# Patient Record
Sex: Male | Born: 1970 | Race: White | Hispanic: No | Marital: Single | Smoking: Never smoker
Health system: Southern US, Community
[De-identification: ages and names within clinical notes are randomized; demographics above are authoritative.]

## PROBLEM LIST (undated history)

## (undated) DIAGNOSIS — M549 Dorsalgia, unspecified: Secondary | ICD-10-CM

## (undated) DIAGNOSIS — K317 Polyp of stomach and duodenum: Secondary | ICD-10-CM

## (undated) DIAGNOSIS — G8929 Other chronic pain: Secondary | ICD-10-CM

## (undated) DIAGNOSIS — K219 Gastro-esophageal reflux disease without esophagitis: Secondary | ICD-10-CM

## (undated) DIAGNOSIS — J309 Allergic rhinitis, unspecified: Secondary | ICD-10-CM

## (undated) DIAGNOSIS — N2889 Other specified disorders of kidney and ureter: Secondary | ICD-10-CM

## (undated) DIAGNOSIS — R519 Headache, unspecified: Secondary | ICD-10-CM

## (undated) DIAGNOSIS — D179 Benign lipomatous neoplasm, unspecified: Secondary | ICD-10-CM

## (undated) DIAGNOSIS — K2270 Barrett's esophagus without dysplasia: Secondary | ICD-10-CM

## (undated) DIAGNOSIS — L408 Other psoriasis: Secondary | ICD-10-CM

## (undated) DIAGNOSIS — K449 Diaphragmatic hernia without obstruction or gangrene: Secondary | ICD-10-CM

## (undated) DIAGNOSIS — J189 Pneumonia, unspecified organism: Secondary | ICD-10-CM

## (undated) DIAGNOSIS — M76899 Other specified enthesopathies of unspecified lower limb, excluding foot: Secondary | ICD-10-CM

## (undated) DIAGNOSIS — B001 Herpesviral vesicular dermatitis: Secondary | ICD-10-CM

## (undated) DIAGNOSIS — R51 Headache: Secondary | ICD-10-CM

## (undated) DIAGNOSIS — E785 Hyperlipidemia, unspecified: Secondary | ICD-10-CM

## (undated) DIAGNOSIS — G4733 Obstructive sleep apnea (adult) (pediatric): Secondary | ICD-10-CM

## (undated) DIAGNOSIS — N182 Chronic kidney disease, stage 2 (mild): Secondary | ICD-10-CM

## (undated) DIAGNOSIS — I82442 Acute embolism and thrombosis of left tibial vein: Secondary | ICD-10-CM

## (undated) DIAGNOSIS — T7840XA Allergy, unspecified, initial encounter: Secondary | ICD-10-CM

## (undated) DIAGNOSIS — F419 Anxiety disorder, unspecified: Secondary | ICD-10-CM

## (undated) DIAGNOSIS — N401 Enlarged prostate with lower urinary tract symptoms: Secondary | ICD-10-CM

## (undated) DIAGNOSIS — K5792 Diverticulitis of intestine, part unspecified, without perforation or abscess without bleeding: Secondary | ICD-10-CM

## (undated) DIAGNOSIS — I10 Essential (primary) hypertension: Secondary | ICD-10-CM

## (undated) DIAGNOSIS — H269 Unspecified cataract: Secondary | ICD-10-CM

## (undated) DIAGNOSIS — D126 Benign neoplasm of colon, unspecified: Secondary | ICD-10-CM

## (undated) HISTORY — DX: Benign neoplasm of colon, unspecified: D12.6

## (undated) HISTORY — DX: Acute embolism and thrombosis of left tibial vein: I82.442

## (undated) HISTORY — DX: Anxiety disorder, unspecified: F41.9

## (undated) HISTORY — DX: Gastro-esophageal reflux disease without esophagitis: K21.9

## (undated) HISTORY — DX: Other chronic pain: G89.29

## (undated) HISTORY — DX: Polyp of stomach and duodenum: K31.7

## (undated) HISTORY — DX: Dorsalgia, unspecified: M54.9

## (undated) HISTORY — DX: Diaphragmatic hernia without obstruction or gangrene: K44.9

## (undated) HISTORY — DX: Headache, unspecified: R51.9

## (undated) HISTORY — DX: Chronic kidney disease, stage 2 (mild): N18.2

## (undated) HISTORY — DX: Benign prostatic hyperplasia with lower urinary tract symptoms: N40.1

## (undated) HISTORY — DX: Obstructive sleep apnea (adult) (pediatric): G47.33

## (undated) HISTORY — DX: Other specified enthesopathies of unspecified lower limb, excluding foot: M76.899

## (undated) HISTORY — DX: Allergy, unspecified, initial encounter: T78.40XA

## (undated) HISTORY — DX: Allergic rhinitis, unspecified: J30.9

## (undated) HISTORY — DX: Other specified disorders of kidney and ureter: N28.89

## (undated) HISTORY — DX: Headache: R51

## (undated) HISTORY — DX: Unspecified cataract: H26.9

## (undated) HISTORY — DX: Essential (primary) hypertension: I10

## (undated) HISTORY — DX: Diverticulitis of intestine, part unspecified, without perforation or abscess without bleeding: K57.92

## (undated) HISTORY — PX: LIPOMA EXCISION: SHX5283

## (undated) HISTORY — DX: Barrett's esophagus without dysplasia: K22.70

## (undated) HISTORY — DX: Hyperlipidemia, unspecified: E78.5

## (undated) HISTORY — DX: Herpesviral vesicular dermatitis: B00.1

## (undated) HISTORY — DX: Benign lipomatous neoplasm, unspecified: D17.9

## (undated) HISTORY — DX: Pneumonia, unspecified organism: J18.9

## (undated) HISTORY — DX: Other psoriasis: L40.8

---

## 2001-03-24 ENCOUNTER — Encounter (INDEPENDENT_AMBULATORY_CARE_PROVIDER_SITE_OTHER): Payer: Self-pay | Admitting: Specialist

## 2001-03-24 ENCOUNTER — Ambulatory Visit (HOSPITAL_BASED_OUTPATIENT_CLINIC_OR_DEPARTMENT_OTHER): Admission: RE | Admit: 2001-03-24 | Discharge: 2001-03-24 | Payer: Self-pay | Admitting: Plastic Surgery

## 2004-06-01 DIAGNOSIS — K317 Polyp of stomach and duodenum: Secondary | ICD-10-CM

## 2004-06-01 HISTORY — DX: Polyp of stomach and duodenum: K31.7

## 2004-07-07 ENCOUNTER — Ambulatory Visit: Payer: Self-pay | Admitting: Internal Medicine

## 2004-11-14 ENCOUNTER — Ambulatory Visit: Payer: Self-pay | Admitting: Internal Medicine

## 2004-11-24 ENCOUNTER — Ambulatory Visit: Payer: Self-pay | Admitting: Gastroenterology

## 2005-02-10 ENCOUNTER — Ambulatory Visit: Payer: Self-pay | Admitting: Gastroenterology

## 2005-02-20 ENCOUNTER — Encounter (INDEPENDENT_AMBULATORY_CARE_PROVIDER_SITE_OTHER): Payer: Self-pay | Admitting: *Deleted

## 2005-02-20 ENCOUNTER — Ambulatory Visit: Payer: Self-pay | Admitting: Gastroenterology

## 2005-06-01 DIAGNOSIS — D179 Benign lipomatous neoplasm, unspecified: Secondary | ICD-10-CM

## 2005-06-01 HISTORY — DX: Benign lipomatous neoplasm, unspecified: D17.9

## 2005-06-03 ENCOUNTER — Ambulatory Visit: Payer: Self-pay | Admitting: Internal Medicine

## 2005-06-04 ENCOUNTER — Ambulatory Visit: Payer: Self-pay | Admitting: Internal Medicine

## 2005-06-19 ENCOUNTER — Ambulatory Visit: Payer: Self-pay | Admitting: Internal Medicine

## 2005-08-17 ENCOUNTER — Ambulatory Visit: Payer: Self-pay | Admitting: Internal Medicine

## 2006-01-19 ENCOUNTER — Encounter (INDEPENDENT_AMBULATORY_CARE_PROVIDER_SITE_OTHER): Payer: Self-pay | Admitting: Specialist

## 2006-01-19 ENCOUNTER — Ambulatory Visit (HOSPITAL_BASED_OUTPATIENT_CLINIC_OR_DEPARTMENT_OTHER): Admission: RE | Admit: 2006-01-19 | Discharge: 2006-01-19 | Payer: Self-pay | Admitting: Plastic Surgery

## 2006-02-15 ENCOUNTER — Ambulatory Visit: Payer: Self-pay | Admitting: Family Medicine

## 2006-03-15 ENCOUNTER — Ambulatory Visit: Payer: Self-pay | Admitting: Internal Medicine

## 2006-04-07 ENCOUNTER — Ambulatory Visit: Payer: Self-pay | Admitting: Internal Medicine

## 2006-05-06 ENCOUNTER — Ambulatory Visit: Payer: Self-pay | Admitting: Internal Medicine

## 2006-07-09 ENCOUNTER — Ambulatory Visit: Payer: Self-pay | Admitting: Internal Medicine

## 2006-11-08 ENCOUNTER — Ambulatory Visit: Payer: Self-pay | Admitting: Internal Medicine

## 2007-01-05 DIAGNOSIS — L408 Other psoriasis: Secondary | ICD-10-CM

## 2007-01-05 DIAGNOSIS — I10 Essential (primary) hypertension: Secondary | ICD-10-CM

## 2007-01-05 HISTORY — DX: Essential (primary) hypertension: I10

## 2007-01-05 HISTORY — DX: Other psoriasis: L40.8

## 2007-02-08 ENCOUNTER — Ambulatory Visit: Payer: Self-pay | Admitting: Internal Medicine

## 2007-02-08 DIAGNOSIS — S838X9A Sprain of other specified parts of unspecified knee, initial encounter: Secondary | ICD-10-CM | POA: Insufficient documentation

## 2007-02-08 DIAGNOSIS — M7660 Achilles tendinitis, unspecified leg: Secondary | ICD-10-CM | POA: Insufficient documentation

## 2007-02-08 DIAGNOSIS — S86819A Strain of other muscle(s) and tendon(s) at lower leg level, unspecified leg, initial encounter: Secondary | ICD-10-CM

## 2007-02-08 DIAGNOSIS — G44209 Tension-type headache, unspecified, not intractable: Secondary | ICD-10-CM | POA: Insufficient documentation

## 2007-03-29 ENCOUNTER — Telehealth: Payer: Self-pay | Admitting: Family Medicine

## 2007-06-03 ENCOUNTER — Telehealth: Payer: Self-pay | Admitting: Internal Medicine

## 2007-07-19 ENCOUNTER — Telehealth: Payer: Self-pay | Admitting: Internal Medicine

## 2007-07-21 ENCOUNTER — Ambulatory Visit: Payer: Self-pay | Admitting: Internal Medicine

## 2007-07-21 DIAGNOSIS — N401 Enlarged prostate with lower urinary tract symptoms: Secondary | ICD-10-CM

## 2007-07-21 DIAGNOSIS — N400 Enlarged prostate without lower urinary tract symptoms: Secondary | ICD-10-CM | POA: Insufficient documentation

## 2007-07-21 DIAGNOSIS — N138 Other obstructive and reflux uropathy: Secondary | ICD-10-CM | POA: Insufficient documentation

## 2007-07-21 HISTORY — DX: Other obstructive and reflux uropathy: N13.8

## 2007-07-21 HISTORY — DX: Benign prostatic hyperplasia with lower urinary tract symptoms: N40.1

## 2007-11-21 ENCOUNTER — Telehealth: Payer: Self-pay | Admitting: Internal Medicine

## 2008-02-08 ENCOUNTER — Telehealth: Payer: Self-pay | Admitting: Internal Medicine

## 2008-02-14 ENCOUNTER — Telehealth: Payer: Self-pay | Admitting: Internal Medicine

## 2008-05-24 ENCOUNTER — Ambulatory Visit: Payer: Self-pay | Admitting: Internal Medicine

## 2008-07-11 ENCOUNTER — Ambulatory Visit: Payer: Self-pay | Admitting: Internal Medicine

## 2008-07-11 DIAGNOSIS — J309 Allergic rhinitis, unspecified: Secondary | ICD-10-CM

## 2008-07-11 DIAGNOSIS — R519 Headache, unspecified: Secondary | ICD-10-CM | POA: Insufficient documentation

## 2008-07-11 DIAGNOSIS — R51 Headache: Secondary | ICD-10-CM | POA: Insufficient documentation

## 2008-07-11 HISTORY — DX: Allergic rhinitis, unspecified: J30.9

## 2008-10-17 ENCOUNTER — Ambulatory Visit: Payer: Self-pay | Admitting: Internal Medicine

## 2008-10-17 DIAGNOSIS — M549 Dorsalgia, unspecified: Secondary | ICD-10-CM

## 2008-10-17 HISTORY — DX: Dorsalgia, unspecified: M54.9

## 2008-10-18 ENCOUNTER — Encounter: Payer: Self-pay | Admitting: Internal Medicine

## 2008-10-19 ENCOUNTER — Telehealth: Payer: Self-pay | Admitting: Internal Medicine

## 2009-06-10 ENCOUNTER — Telehealth: Payer: Self-pay | Admitting: *Deleted

## 2009-06-10 ENCOUNTER — Ambulatory Visit: Payer: Self-pay | Admitting: Internal Medicine

## 2009-06-14 ENCOUNTER — Emergency Department (HOSPITAL_BASED_OUTPATIENT_CLINIC_OR_DEPARTMENT_OTHER): Admission: EM | Admit: 2009-06-14 | Discharge: 2009-06-15 | Payer: Self-pay | Admitting: Emergency Medicine

## 2009-06-15 ENCOUNTER — Ambulatory Visit: Payer: Self-pay | Admitting: Diagnostic Radiology

## 2009-06-17 LAB — CONVERTED CEMR LAB
Basophils Absolute: 0.1 10*3/uL (ref 0.0–0.1)
Basophils Relative: 1 % (ref 0–1)
Eosinophils Absolute: 0.2 10*3/uL (ref 0.0–0.7)
Eosinophils Relative: 2 % (ref 0–5)
HCT: 46.4 % (ref 39.0–52.0)
Hemoglobin: 16.3 g/dL (ref 13.0–17.0)
Lymphocytes Relative: 19 % (ref 12–46)
Lymphs Abs: 1.2 10*3/uL (ref 0.7–4.0)
MCHC: 35.1 g/dL (ref 30.0–36.0)
MCV: 87.7 fL (ref 78.0–100.0)
Monocytes Absolute: 0.8 10*3/uL (ref 0.1–1.0)
Monocytes Relative: 12 % (ref 3–12)
Neutro Abs: 4.3 10*3/uL (ref 1.7–7.7)
Neutrophils Relative %: 67 % (ref 43–77)
Platelets: 307 10*3/uL (ref 150–400)
RBC: 5.29 M/uL (ref 4.22–5.81)
RDW: 12 % (ref 11.5–15.5)
WBC: 6.4 10*3/uL (ref 4.0–10.5)

## 2009-09-09 ENCOUNTER — Telehealth: Payer: Self-pay | Admitting: Internal Medicine

## 2009-09-30 ENCOUNTER — Ambulatory Visit: Payer: Self-pay | Admitting: Internal Medicine

## 2009-09-30 DIAGNOSIS — M76899 Other specified enthesopathies of unspecified lower limb, excluding foot: Secondary | ICD-10-CM

## 2009-09-30 HISTORY — DX: Other specified enthesopathies of unspecified lower limb, excluding foot: M76.899

## 2009-11-12 ENCOUNTER — Ambulatory Visit: Payer: Self-pay | Admitting: Internal Medicine

## 2010-07-01 NOTE — Progress Notes (Signed)
Summary: wants ov   Phone Note Call from Patient Call back at Home Phone [phone]   Caller: Patient-live call Complaint: Nausea/Vomiting/Diarrhea Summary of Call: Wants ov with Dr Lovell Sheehan. Neck and hip pain with nausea. Initial call taken by: Warnell Forester,  June 10, 2009 9:35 AM  Follow-up for Phone Call        ov given Follow-up by: Willy Eddy, LPN,  June 10, 2009 9:51 AM

## 2010-07-01 NOTE — Assessment & Plan Note (Signed)
Summary: follow up on rt hip injury/cjr   Vital Signs:  Patient profile:   40 year old male Weight:      182 pounds Temp:     98.1 degrees F oral BP sitting:   128 / 90  (right arm) Cuff size:   regular  Vitals Entered By: Duard Brady LPN (November 12, 2009 11:21 AM) CC: f/u on (R) hip pain - worse Is Patient Diabetic? No   CC:  f/u on (R) hip pain - worse.  History of Present Illness: 40 year old patient, who presents with a 4-day history of severe right hip pain.  This occurred suddenly [place] the beach [place] un[place] some luggage.  He is a fairly uncomfortable weekend. pain is aggravated by movement and weight-bearing.  He was seen here one month ago and treated with a general steroid injection that was felt to be a right hip bursitis.  This improved greatly, but probably did not return to baseline.  He states that for the past year.  He has had intermittent right hip pain  Allergies: 1)  ! Penicillin 2)  ! * Pepto Bismal  Past History:  Past Medical History: Reviewed history from 07/11/2008 and no changes required. Hypertension psoriasis Allergic rhinitis Headache  Physical Exam  General:  Well-developed,well-nourished,in no acute distress; alert,appropriate and cooperative throughout examination Msk:  range of motion right hip did tend to elicit pain.  There is no localized tenderness.  Pain was worse with internal rotation of the hip   Impression & Recommendations:  Problem # 1:  BURSITIS, HIP (ICD-726.5)  in view of the chronicity of the pain will set up for orthopedic evaluation  Orders: Radiology Referral (Radiology)  Problem # 2:  HYPERTENSION (ICD-401.9)  Complete Medication List: 1)  Cephadyn 50-650 Mg Tabs (Butalbital-acetaminophen) .... One by mouth q 4-6 hours prn 2)  Valtrex 1 Gm Tabs (Valacyclovir hcl) .... 1/2 by mouth daily 3)  Singulair 10 Mg Tabs (Montelukast sodium) .... Once daily 4)  Alprazolam 0.25 Mg Tabs (Alprazolam)  .... One  by mouth every 6 hour as needed anxiety 5)  Finasteride 5 Mg Tabs (Finasteride) .... One by mouth dailu 6)  Aciphex 20 Mg Tbec (Rabeprazole sodium) .... Qd 7)  Zyrtec Allergy 10 Mg Tbdp (Cetirizine hcl) .... Qd 8)  Doxycycline Hyclate 100 Mg Caps (Doxycycline hyclate) .... Bid  Patient Instructions: 1)  orthopedic follow-up as scheduled 2)  Take 400-600mg  of Ibuprofen (Advil, Motrin) with food every 4-6 hours as needed for relief of pain or comfort of fever.  Appended Document: Orders Update     Clinical Lists Changes  Orders: Added new Referral order of Orthopedic Referral (Ortho) - Signed

## 2010-07-01 NOTE — Assessment & Plan Note (Signed)
Summary: neck pain/bmw   Vital Signs:  Patient profile:   40 year old male Height:      69 inches Weight:      180 pounds BMI:     26.68 Temp:     98.2 degrees F oral Pulse rate:   72 / minute Resp:     14 per minute BP sitting:   110 / 70  (left arm)  Vitals Entered By: Willy Eddy, LPN (June 10, 2009 3:55 PM) CC: roa, Headache   CC:  roa and Headache.  History of Present Illness: The pt has aching and sense of enlarged lymph nodes in anterior chain has had up to three weeks of neck neck pain in the occiput to the trap on the left side and pain down the right leg with running has hx of mild disc dz Increased head aches with no relief from tylenol and nausia hx of migraines no night sweats  Preventive Screening-Counseling & Management  Alcohol-Tobacco     Smoking Status: never  Problems Prior to Update: 1)  Back Pain, Chronic, Intermittent  (ICD-724.5) 2)  Headache  (ICD-784.0) 3)  Allergic Rhinitis  (ICD-477.9) 4)  Sterilization  (ICD-V25.2) 5)  Benign Prostatic Hypertrophy, With Urinary Obstruction  (ICD-600.01) 6)  Tendinitis, Achilles  (ICD-726.71) 7)  Headache, Tension  (ICD-307.81) 8)  Muscle Strain, Right Calf  (ICD-844.8) 9)  Hypertension  (ICD-401.9) 10)  Psoriasis  (ICD-696.1)  Medications Prior to Update: 1)  Aciphex 20 Mg Tbec (Rabeprazole Sodium) .... Once Daily 2)  Cephadyn 50-650 Mg Tabs (Butalbital-Acetaminophen) .... One By Mouth Q 4-6 Hours Prn 3)  Nasonex 50 Mcg/act Susp (Mometasone Furoate) .... Two Sprays Per Nostril  Daily 4)  Valtrex 1 Gm Tabs (Valacyclovir Hcl) .... 1/2 By Mouth Daily 5)  Singulair 10 Mg  Tabs (Montelukast Sodium) .... Once Daily 6)  Claritin-D 12 Hour 5-120 Mg Xr12h-Tab (Loratadine-Pseudoephedrine) .... One By Mouth Two Times A Day   Generic Ok 7)  Alprazolam 0.25 Mg Tabs (Alprazolam) .... One  By Mouth Every 6 Hour As Needed Anxiety 8)  Rapaflo 4 Mg Caps (Silodosin) .... One By Mouth Daily 9)  Finasteride 5 Mg  Tabs (Finasteride) .... One By Mouth Dailu 10)  Zipsor 25 Mg Caps (Diclofenac Potassium) .... One By Mouth Tid 11)  Amrix 15 Mg Xr24h-Cap (Cyclobenzaprine Hcl) .... Two By Mouth Daily  Current Medications (verified): 1)  Aciphex 20 Mg Tbec (Rabeprazole Sodium) .... Once Daily 2)  Cephadyn 50-650 Mg Tabs (Butalbital-Acetaminophen) .... One By Mouth Q 4-6 Hours Prn 3)  Nasonex 50 Mcg/act Susp (Mometasone Furoate) .... Two Sprays Per Nostril  Daily 4)  Valtrex 1 Gm Tabs (Valacyclovir Hcl) .... 1/2 By Mouth Daily 5)  Singulair 10 Mg  Tabs (Montelukast Sodium) .... Once Daily 6)  Claritin-D 12 Hour 5-120 Mg Xr12h-Tab (Loratadine-Pseudoephedrine) .... One By Mouth Two Times A Day   Generic Ok 7)  Alprazolam 0.25 Mg Tabs (Alprazolam) .... One  By Mouth Every 6 Hour As Needed Anxiety 8)  Rapaflo 4 Mg Caps (Silodosin) .... One By Mouth Daily 9)  Finasteride 5 Mg Tabs (Finasteride) .... One By Mouth Dailu 10)  Zipsor 25 Mg Caps (Diclofenac Potassium) .... One By Mouth Tid 11)  Amrix 15 Mg Xr24h-Cap (Cyclobenzaprine Hcl) .... Two By Mouth Daily  Allergies (verified): 1)  ! Penicillin 2)  ! * Pepto Bismal  Past History:  Family History: Last updated: 02/08/2007 na  Social History: Last updated: 02/08/2007 Occupation: Married Never Smoked  Risk Factors: Smoking Status: never (06/10/2009)  Past medical, surgical, family and social histories (including risk factors) reviewed, and no changes noted (except as noted below).  Past Medical History: Reviewed history from 07/11/2008 and no changes required. Hypertension psoriasis Allergic rhinitis Headache  Past Surgical History: Reviewed history from 02/08/2007 and no changes required. lipoma on arm ( 10)  Family History: Reviewed history from 02/08/2007 and no changes required. na  Social History: Reviewed history from 02/08/2007 and no changes required. Occupation: Married Never Smoked  Review of Systems  The patient denies  anorexia, fever, weight loss, weight gain, vision loss, decreased hearing, hoarseness, chest pain, syncope, dyspnea on exertion, peripheral edema, prolonged cough, headaches, hemoptysis, abdominal pain, melena, hematochezia, severe indigestion/heartburn, hematuria, incontinence, genital sores, muscle weakness, suspicious skin lesions, transient blindness, difficulty walking, depression, unusual weight change, abnormal bleeding, enlarged lymph nodes, angioedema, and breast masses.    Physical Exam  General:  Well-developed,well-nourished,in no acute distress; alert,appropriate and cooperative throughout examination Head:  Normocephalic and atraumatic without obvious abnormalities. No apparent alopecia or balding. Eyes:  pupils equal and pupils round.   Ears:  R ear normal and L ear normal.   Nose:  no external deformity and no nasal discharge.   Mouth:  good dentition and pharynx pink and moist.   Neck:  No deformities, masses, or tenderness noted. Lungs:  Normal respiratory effort, chest expands symmetrically. Lungs are clear to auscultation, no crackles or wheezes. Heart:  Normal rate and regular rhythm. S1 and S2 normal without gallop, murmur, click, rub or other extra sounds. Abdomen:  Bowel sounds positive,abdomen soft and non-tender without masses, organomegaly or hernias noted. Msk:  tender right SCM and Left trap with tenderness no masedes Pulses:  R and L carotid,radial,femoral,dorsalis pedis and posterior tibial pulses are full and equal bilaterally Extremities:  No clubbing, cyanosis, edema, or deformity noted with normal full range of motion of all joints.   Cervical Nodes:  No lymphadenopathy noted Axillary Nodes:  No palpable lymphadenopathy Psych:  Oriented X3 and not anxious appearing.     Impression & Recommendations:  Problem # 1:  HEADACHE (ICD-784.0)  migrain  The following medications were removed from the medication list:    Zipsor 25 Mg Caps (Diclofenac potassium)  ..... One by mouth tid His updated medication list for this problem includes:    Cephadyn 50-650 Mg Tabs (Butalbital-acetaminophen) ..... One by mouth q 4-6 hours prn    Indomethacin 50 Mg Caps (Indomethacin) ..... One by mouth two times a day  Headache diary reviewed.  Orders: Venipuncture (78295) TLB-CBC Platelet - w/Differential (85025-CBCD)  Complete Medication List: 1)  Protonix 20 Mg Tbec (Pantoprazole sodium) .... One by mouth daily 2)  Cephadyn 50-650 Mg Tabs (Butalbital-acetaminophen) .... One by mouth q 4-6 hours prn 3)  Nasonex 50 Mcg/act Susp (Mometasone furoate) .... Two sprays per nostril  daily 4)  Valtrex 1 Gm Tabs (Valacyclovir hcl) .... 1/2 by mouth daily 5)  Singulair 10 Mg Tabs (Montelukast sodium) .... Once daily 6)  Claritin-d 12 Hour 5-120 Mg Xr12h-tab (Loratadine-pseudoephedrine) .... One by mouth two times a day   generic ok 7)  Alprazolam 0.25 Mg Tabs (Alprazolam) .... One  by mouth every 6 hour as needed anxiety 8)  Rapaflo 4 Mg Caps (Silodosin) .... One by mouth daily 9)  Finasteride 5 Mg Tabs (Finasteride) .... One by mouth dailu 10)  Tizanidine Hcl 4 Mg Tabs (Tizanidine hcl) .... One by mouth two times a day 11)  Indomethacin 50 Mg  Caps (Indomethacin) .... One by mouth two times a day 12)  Methylprednisolone 4 Mg Tabs (Methylprednisolone) .... 4 by mouth for 3 days then 3 by mouth for 3 days the 2 by mouth for 3 days then 1 by mouth for 3 days  Patient Instructions: 1)  Please schedule a follow-up appointment in 1 month. Prescriptions: PROTONIX 20 MG TBEC (PANTOPRAZOLE SODIUM) one by mouth daily  #30 x 11   Entered and Authorized by:   Stacie Glaze MD   Signed by:   Stacie Glaze MD on 06/10/2009   Method used:   Electronically to        [facility] [phone]* (retail)       [address]       [place], [place]  [address]       Ph: [phone]       Fax: [phone]   RxID:   [id number] METHYLPREDNISOLONE 4  MG TABS (METHYLPREDNISOLONE) 4 by mouth for 3 days then 3 by mouth for 3 days the 2 by mouth for 3 days then 1 by mouth for 3 days  #30 x 0   Entered and Authorized by:   Stacie Glaze MD   Signed by:   Stacie Glaze MD on 06/10/2009   Method used:   Electronically to        [facility] [phone]* (retail)       [address]       [place], [place]  [address]       Ph: [phone]       Fax: [phone]   RxID:   [id number] INDOMETHACIN 50 MG CAPS (INDOMETHACIN) one by mouth two times a day  #60 x 0   Entered and Authorized by:   Stacie Glaze MD   Signed by:   Stacie Glaze MD on 06/10/2009   Method used:   Electronically to        [facility] [phone]* (retail)       [address]       [place], [place]  [address]       Ph: [phone]       Fax: [phone]   RxID:   [id number] TIZANIDINE HCL 4 MG TABS (TIZANIDINE HCL) one by mouth two times a day  #30 x 1   Entered and Authorized by:   Stacie Glaze MD   Signed by:   Stacie Glaze MD on 06/10/2009   Method used:   Electronically to        [facility] [phone]* (retail)       [address]       [place], [place]  [address]       Ph: [phone]       Fax: [phone]   RxID:   [id number]

## 2010-07-01 NOTE — Assessment & Plan Note (Signed)
Summary: HIP PAIN, PINCHED NERVE??   Vital Signs:  Patient profile:   40 year old male Weight:      188 pounds Temp:     98.1 degrees F oral BP sitting:   116 / 70  (left arm) Cuff size:   regular  Vitals Entered By: Duard Brady LPN (Oct 01, 5619 10:59 AM) CC: c.o (R) hip pain on/off x8yrs - bad today   , c/o headache Is Patient Diabetic? No   CC:  c.o (R) hip pain on/off x71yrs - bad today    and c/o headache.  History of Present Illness: a 40 year old patient has a history of intermittent low back and hip pain for the past two weeks.  He is an increase in the right hip discomfort.  This is aggravated by bending and stooping, and pressure over this area.  He was involved recently,  in  Patent examiner training, activities, which really aggravated the hip discomfort  Preventive Screening-Counseling & Management  Alcohol-Tobacco     Smoking Status: never  Allergies: 1)  ! Penicillin 2)  ! * Pepto Bismal  Physical Exam  General:  Well-developed,well-nourished,in no acute distress; alert,appropriate and cooperative throughout examination Msk:  range-of-motion the right hip was intact.  Straight leg testing negative;  internal rotation.  The hip did tend to aggravate the discomfort   Impression & Recommendations:  Problem # 1:  BURSITIS, HIP (ICD-726.5)  Complete Medication List: 1)  Cephadyn 50-650 Mg Tabs (Butalbital-acetaminophen) .... One by mouth q 4-6 hours prn 2)  Valtrex 1 Gm Tabs (Valacyclovir hcl) .... 1/2 by mouth daily 3)  Singulair 10 Mg Tabs (Montelukast sodium) .... Once daily 4)  Alprazolam 0.25 Mg Tabs (Alprazolam) .... One  by mouth every 6 hour as needed anxiety 5)  Finasteride 5 Mg Tabs (Finasteride) .... One by mouth dailu 6)  Aciphex 20 Mg Tbec (Rabeprazole sodium) .... Qd 7)  Zyrtec Allergy 10 Mg Tbdp (Cetirizine hcl) .... Qd  Patient Instructions: 1)  Aleve two tablets twice daily as needed for pain 2)  call if  unimproved Prescriptions: ALPRAZOLAM 0.25 MG TABS (ALPRAZOLAM) ONE  by mouth every 6 hour as needed anxiety  #60 x 5   Entered and Authorized by:   Gordy Savers  MD   Signed by:   Gordy Savers  MD on 09/30/2009   Method used:   Print then Give to Patient   RxID:   [id number] CEPHADYN 50-650 MG TABS (BUTALBITAL-ACETAMINOPHEN) one by mouth q 4-6 hours prn  #60 x 1   Entered and Authorized by:   Gordy Savers  MD   Signed by:   Gordy Savers  MD on 09/30/2009   Method used:   Print then Give to Patient   RxID:   [id number]   Appended Document: HIP PAIN, PINCHED NERVE??     Vitals Entered By: Duard Brady LPN (Sep 30, 4401 11:54 AM)  Allergies: 1)  ! Penicillin 2)  ! * Pepto Bismal   Complete Medication List: 1)  Cephadyn 50-650 Mg Tabs (Butalbital-acetaminophen) .... One by mouth q 4-6 hours prn 2)  Valtrex 1 Gm Tabs (Valacyclovir hcl) .... 1/2 by mouth daily 3)  Singulair 10 Mg Tabs (Montelukast sodium) .... Once daily 4)  Alprazolam 0.25 Mg Tabs (Alprazolam) .... One  by mouth every 6 hour as needed anxiety 5)  Finasteride 5 Mg Tabs (Finasteride) .... One by mouth dailu 6)  Aciphex 20 Mg Tbec (Rabeprazole sodium) .... Qd  7)  Zyrtec Allergy 10 Mg Tbdp (Cetirizine hcl) .... Qd  Other Orders: Depo- Medrol 80mg  (J1040) Admin of Therapeutic Inj  intramuscular or subcutaneous (45409)    Medication Administration  Injection # 1:    Medication: Depo- Medrol 80mg     Diagnosis: BURSITIS, HIP (ICD-726.5)    Route: IM    Site: R deltoid    Exp Date: 04/2012    Lot #: [id number]    Mfr: Pharmacia    Patient tolerated injection without complications    Given by: Duard Brady LPN (Sep 30, 8117 11:55 AM)  Orders Added: 1)  Depo- Medrol 80mg  [J1040] 2)  Admin of Therapeutic Inj  intramuscular or subcutaneous [[id number]]

## 2010-07-01 NOTE — Progress Notes (Signed)
  Phone Note Call from Patient   Caller: Patient Call For: Stacie Glaze MD Summary of Call: [facility]) Singulair 10 mg. #30 3 refills.  Initial call taken by: Lynann Beaver CMA,  September 09, 2009 3:00 PM    New/Updated Medications: SINGULAIR 10 MG  TABS (MONTELUKAST SODIUM) once daily Prescriptions: SINGULAIR 10 MG  TABS (MONTELUKAST SODIUM) once daily  #30 x 3   Entered by:   Lynann Beaver CMA   Authorized by:   Stacie Glaze MD   Signed by:   Lynann Beaver CMA on 09/09/2009   Method used:   Electronically to        [phone] (retail)       [address]       [place], [place]  [address]       Ph: [phone] or [phone]       Fax: [phone]   RxID:   [id number]

## 2010-07-01 NOTE — Letter (Signed)
Summary: Generic Letter  [facility]  [address] [place], [place] [address]   Phone: [phone]  Fax: [phone]    09/30/2009  [name] [address] [place], [place]  [address]  Dear Benjamin [name]:  [name] has a history of right hip bursitis, which flares up intermittently, as well as low back pain.  At the present time, he is under active treatment for a flare of the hip bursitis.  It is recommended that he  postpone any vigorous training maneuvers, and activities  until clinically stable.           Sincerely,   Eleonore Chiquito  MD

## 2010-07-03 ENCOUNTER — Other Ambulatory Visit: Payer: Self-pay | Admitting: Internal Medicine

## 2010-07-03 DIAGNOSIS — R51 Headache: Secondary | ICD-10-CM

## 2010-07-04 ENCOUNTER — Other Ambulatory Visit: Payer: Self-pay | Admitting: Internal Medicine

## 2010-07-04 DIAGNOSIS — R51 Headache: Secondary | ICD-10-CM

## 2010-07-12 ENCOUNTER — Other Ambulatory Visit: Payer: Self-pay | Admitting: Internal Medicine

## 2010-07-12 DIAGNOSIS — R51 Headache: Secondary | ICD-10-CM

## 2010-07-14 MED ORDER — BUTALBITAL-ACETAMINOPHEN 50-650 MG PO TABS: 50.0000 | ORAL_TABLET | ORAL | Status: DC | PRN

## 2010-07-14 NOTE — Telephone Encounter (Signed)
Dr. Lovell Sheehan patient.

## 2010-07-17 ENCOUNTER — Other Ambulatory Visit: Payer: Self-pay | Admitting: Internal Medicine

## 2010-07-18 NOTE — Telephone Encounter (Signed)
Dr. Lovell Sheehan pt.

## 2010-08-17 LAB — BASIC METABOLIC PANEL
BUN: 29 mg/dL — ABNORMAL HIGH (ref 6–23)
CO2: 29 mEq/L (ref 19–32)
Calcium: 9.2 mg/dL (ref 8.4–10.5)
Chloride: 102 mEq/L (ref 96–112)
Creatinine, Ser: 1.1 mg/dL (ref 0.4–1.5)
GFR calc Af Amer: >60 mL/min (ref >60)
GFR calc non Af Amer: >60 mL/min (ref >60)
Glucose, Bld: 108 mg/dL — ABNORMAL HIGH (ref 70–99)
Potassium: 3.9 mEq/L (ref 3.5–5.1)
Sodium: 144 mEq/L (ref 135–145)

## 2010-08-17 LAB — DIFFERENTIAL
Basophils Absolute: 0.3 10*3/uL — ABNORMAL HIGH (ref 0.0–0.1)
Basophils Relative: 2 % — ABNORMAL HIGH (ref 0–1)
Eosinophils Absolute: 0.1 10*3/uL (ref 0.0–0.7)
Eosinophils Relative: 1 % (ref 0–5)
Lymphocytes Relative: 16 % (ref 12–46)
Lymphs Abs: 2.6 10*3/uL (ref 0.7–4.0)
Monocytes Absolute: 1 10*3/uL (ref 0.1–1.0)
Monocytes Relative: 6 % (ref 3–12)
Neutro Abs: 12.4 10*3/uL — ABNORMAL HIGH (ref 1.7–7.7)
Neutrophils Relative %: 75 % (ref 43–77)

## 2010-08-17 LAB — CBC
HCT: 50.6 % (ref 39.0–52.0)
Hemoglobin: 17.5 g/dL — ABNORMAL HIGH (ref 13.0–17.0)
MCHC: 34.5 g/dL (ref 30.0–36.0)
MCV: 90.3 fL (ref 78.0–100.0)
Platelets: 412 10*3/uL — ABNORMAL HIGH (ref 150–400)
RBC: 5.6 MIL/uL (ref 4.22–5.81)
RDW: 11.6 % (ref 11.5–15.5)
WBC: 16.4 10*3/uL — ABNORMAL HIGH (ref 4.0–10.5)

## 2010-08-17 LAB — URINALYSIS, ROUTINE W REFLEX MICROSCOPIC
Bilirubin Urine: NEGATIVE
Glucose, UA: NEGATIVE mg/dL
Hgb urine dipstick: NEGATIVE
Ketones, ur: NEGATIVE mg/dL
Nitrite: NEGATIVE
Protein, ur: NEGATIVE mg/dL
Specific Gravity, Urine: 1.037 — ABNORMAL HIGH (ref 1.005–1.030)
Urobilinogen, UA: 0.2 mg/dL (ref 0.0–1.0)
pH: 6 (ref 5.0–8.0)

## 2010-08-28 ENCOUNTER — Other Ambulatory Visit: Payer: Self-pay | Admitting: Internal Medicine

## 2010-09-26 ENCOUNTER — Other Ambulatory Visit: Payer: Self-pay | Admitting: Internal Medicine

## 2010-10-17 NOTE — Op Note (Signed)
[place]  Patient:    Benjamin [name], Benjamin [name] Visit Number: [id number] MRN: [id number]          Service Type: DSU Location: [facility] Attending Physician:  Eloise Levels Dictated by:   Mary A. Contogiannis, M.D. Proc. Date: 03/24/01 Admit Date:  03/24/2001 Discharge Date: 03/24/2001                             Operative Report  PREOPERATIVE DIAGNOSES: 1. Right forearm masses times two. 2. Left forearm mass.  POSTOPERATIVE DIAGNOSES: 1. Right forearm masses times two. 2. Left forearm mass.  PROCEDURE: 1. Excision of 2.5 cm right proximal forearm lipoma. 2. Excision of 1.7 cm right distal forearm lipoma. 3. Excision of 2.2 cm left forearm lipoma. 4. Intermediate closure of 1.7 cm proximal right forearm incision. 5. Intermediate closure of 1.5 cm distal right forearm incision. 6. Intermediate closure of 1.7 cm left forearm incision.  SURGEON:  Mary A. Contogiannis, M.D.  ANESTHESIA:  One percent lidocaine with epinephrine.  COMPLICATIONS:  None.  INDICATIONS FOR PROCEDURE:  The patient is a 40 year old Caucasian male who had been referred by Dr. Darryll Capers for evaluation of bilateral forearm masses.  The patient had these masses which were palpable in the office and are becoming clinically symptomatic.  Additionally today, he also has a second right forearm mass that has become clinically symptomatic since his visit to me, and will likely have it excised as well.  We will therefore proceed with excision of the three soft tissue masses from the forearm.  DESCRIPTION OF PROCEDURE:  The patient was brought to the operating room and placed on the OR table in the supine position.  The forearms were prepped with Betadine and draped in a sterile fashion.  The skin and subcutaneous tissues in the areas of the soft tissue lesions were then injected with 1% lidocaine with epinephrine.  After adequate hemostasis and anesthesia had taken  effect, the procedure was begun.  These masses were easily palpable.  First, an incision was made over the proximal right forearm mass.  The skin was divided.  Then, when the subcutaneous tissues were entered, it was apparent that there was a lipoma present.  This lipoma measured 2.5 cm.  The lipoma was then sharply excised and passed off the table to undergo permanent pathologic section evaluation.  The incision was then closed with 4-0 Monocryl in the deeper subcutaneous tissues followed by 4-0 Monocryl interrupted dermal sutures, and then a 4-0 Monocryl running intracuticular stitch on the skin.  A second distal right forearm mass was palpable.  An incision was made over this and through the skin into the subcutaneous tissues.  Another lipoma was apparent.  This lipoma was then excised sharply.  This lipoma measured 1.7 cm. The deeper subcutaneous tissues were closed with a 4-0 Monocryl interrupted suture.  The dermal layer was closed with 4-0 Monocryl interrupted sutures followed by 4-0 Monocryl running intracuticular stitch on the skin.  The left forearm was then approached.  The palpable mass then had an incision made over it that went through the skin into the subcutaneous tissues.  Upon entering the subcutaneous tissue, another fatty tumor mass was present.  This mass was then sharply excised.  That apparent lipoma measured 2.2 cm.  The wound was then closed with 4-0 Monocryl in the deeper subcutaneous tissues followed by a 4-0 Monocryl interrupted dermal sutures followed by 4-0 Monocryl running intracuticular  stitch on the skin.  All the incisions were dressed with Benzoin and Steri-Strips.  There were no complications.  The patient tolerated the procedure well.  All the three specimens were passed off the table to undergo permanent pathologic section evaluation.  The patient was ambulated and recovered well.  He was given proper wound care instructions.  He was discharged home  in stable condition with a follow up appointment planned for tomorrow in the office. Dictated by:   Mary A. Contogiannis, M.D. Attending Physician:  Eloise Levels DD:  03/24/01 TD:  03/27/01 Job: [id number]

## 2010-11-19 ENCOUNTER — Other Ambulatory Visit: Payer: Self-pay | Admitting: Internal Medicine

## 2010-12-22 ENCOUNTER — Other Ambulatory Visit: Payer: Self-pay | Admitting: Plastic Surgery

## 2011-03-25 ENCOUNTER — Encounter: Payer: Self-pay | Admitting: Internal Medicine

## 2011-03-26 ENCOUNTER — Encounter: Payer: Self-pay | Admitting: Internal Medicine

## 2011-03-26 ENCOUNTER — Ambulatory Visit (INDEPENDENT_AMBULATORY_CARE_PROVIDER_SITE_OTHER): Payer: 59 | Admitting: Internal Medicine

## 2011-03-26 VITALS — BP 120/82 | Temp 97.9°F | Wt 184.0 lb

## 2011-03-26 DIAGNOSIS — Z0000 Encounter for general adult medical examination without abnormal findings: Secondary | ICD-10-CM

## 2011-03-26 DIAGNOSIS — Z23 Encounter for immunization: Secondary | ICD-10-CM

## 2011-03-26 DIAGNOSIS — I10 Essential (primary) hypertension: Secondary | ICD-10-CM

## 2011-03-26 DIAGNOSIS — R51 Headache: Secondary | ICD-10-CM

## 2011-03-26 MED ORDER — ALPRAZOLAM 0.25 MG PO TABS: 0.2500 mg | ORAL_TABLET | Freq: Four times a day (QID) | ORAL | Status: DC | PRN

## 2011-03-26 MED ORDER — TRAMADOL HCL 50 MG PO TABS: 50.0000 mg | ORAL_TABLET | Freq: Four times a day (QID) | ORAL | Status: DC | PRN

## 2011-03-26 NOTE — Progress Notes (Signed)
  Subjective:    Patient ID: Benjamin [name], male    DOB: 06-May-1971, 40 y.o.   MRN: [id number]  HPI  40 year old patient who has a history of hypertension. Presently he is not on medication. He has been seen by plastic surgery and on at least one occasion minor surgery was postponed due to 2 elevated blood pressure. His chief complaint today is headache neck pain as well as upper back and shoulder discomfort. 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 He does have a family history of migraine headaches he describes pain in the neck upper shoulder and upper back regions. He also describes some rare tingling involving the dorsal aspect of his right hand this has been present just for the past few days.    Review of Systems  Constitutional: Negative for fever, chills, appetite change and fatigue.  HENT: Negative for hearing loss, ear pain, congestion, sore throat, trouble swallowing, neck stiffness, dental problem, voice change and tinnitus.   Eyes: Negative for pain, discharge and visual disturbance.  Respiratory: Negative for cough, chest tightness, wheezing and stridor.   Cardiovascular: Negative for chest pain, palpitations and leg swelling.  Gastrointestinal: Negative for nausea, vomiting, abdominal pain, diarrhea, constipation, blood in stool and abdominal distention.  Genitourinary: Negative for urgency, hematuria, flank pain, discharge, difficulty urinating and genital sores.  Musculoskeletal: Negative for myalgias, back pain, joint swelling, arthralgias and gait problem.  Skin: Negative for rash.  Neurological: Positive for headaches. Negative for dizziness, syncope, speech difficulty, weakness and numbness.  Hematological: Negative for adenopathy. Does not bruise/bleed easily.  Psychiatric/Behavioral: Negative for behavioral problems and dysphoric mood. The patient is not nervous/anxious.          Objective:   Physical Exam  Constitutional: He is oriented to person, place, and time. He appears well-developed.       Blood pressure 140/90  HENT:  Head: Normocephalic.  Right Ear: External ear normal.  Left Ear: External ear normal.  Eyes: Conjunctivae and EOM are normal.  Neck: Normal range of motion.  Cardiovascular: Normal rate and normal heart sounds.   Pulmonary/Chest: Breath sounds normal.  Abdominal: Bowel sounds are normal.  Musculoskeletal: Normal range of motion. He exhibits no edema and no tenderness.       Range of motion of the head and neck appear to be intact;   Neurological: He is alert and oriented to person, place, and time.       Biceps and triceps stretch reflexes were intact No arm weakness with normal grip strength and normal flexion and extension of the arm  Psychiatric: He has a normal mood and affect. His behavior is normal.          Assessment & Plan:   Headache neck pain Borderline hypertension  We'll refill his Xanax which has been quite helpful in the past for headaches. Will try tramadol for pain. He will continue anti-inflammatories If improved will consider radiologic evaluation

## 2011-03-26 NOTE — Patient Instructions (Signed)
Take Aleve 200 mg twice daily for pain or swelling  Most patients with neck  pain will improve with time over the next two to 6 weeks.  Keep active but avoid any activities that cause pain.  Apply moist  heat to the low back area several times daily.  Call or return to clinic prn if these symptoms worsen or fail to improve as anticipated.

## 2011-03-31 ENCOUNTER — Other Ambulatory Visit: Payer: Self-pay | Admitting: Internal Medicine

## 2011-06-12 ENCOUNTER — Telehealth: Payer: Self-pay | Admitting: *Deleted

## 2011-06-12 MED ORDER — ALPRAZOLAM 0.25 MG PO TABS: 0.2500 mg | ORAL_TABLET | Freq: Four times a day (QID) | ORAL | Status: DC | PRN

## 2011-06-12 MED ORDER — VALACYCLOVIR HCL 1 G PO TABS: 500.0000 mg | ORAL_TABLET | Freq: Every day | ORAL | Status: DC

## 2011-06-12 NOTE — Telephone Encounter (Signed)
Pt informed [facility] was [facility]

## 2011-06-12 NOTE — Telephone Encounter (Signed)
Pt is requesting refills on Xanax and Valtrex, please.

## 2011-07-06 ENCOUNTER — Ambulatory Visit (INDEPENDENT_AMBULATORY_CARE_PROVIDER_SITE_OTHER): Payer: 59 | Admitting: Internal Medicine

## 2011-07-06 ENCOUNTER — Encounter: Payer: Self-pay | Admitting: Internal Medicine

## 2011-07-06 DIAGNOSIS — R51 Headache: Secondary | ICD-10-CM

## 2011-07-06 DIAGNOSIS — I10 Essential (primary) hypertension: Secondary | ICD-10-CM

## 2011-07-06 DIAGNOSIS — M549 Dorsalgia, unspecified: Secondary | ICD-10-CM

## 2011-07-06 MED ORDER — TRAMADOL HCL 50 MG PO TABS: 50.0000 mg | ORAL_TABLET | Freq: Four times a day (QID) | ORAL | Status: DC | PRN

## 2011-07-06 MED ORDER — CYCLOBENZAPRINE HCL 10 MG PO TABS: 10.0000 mg | ORAL_TABLET | Freq: Three times a day (TID) | ORAL | Status: AC | PRN

## 2011-07-06 NOTE — Progress Notes (Signed)
  Subjective:    Patient ID: Benjamin [name], male    DOB: 1970-07-05, 41 y.o.   MRN: [id number]  HPI  41 year old patient has a history of some chronic low back pain. He also has a history of labile hypertension. The past 3 weeks he has had intermittent headaches neck and upper back and right shoulder discomfort. He has used tramadol with modest benefit. No radicular symptoms.   Review of Systems  Musculoskeletal: Positive for myalgias and back pain.  Neurological: Positive for headaches.       Objective:   Physical Exam  Constitutional: He appears well-developed and well-nourished. No distress.       Blood pressure 130/96  Neck: Normal range of motion. Neck supple.       Range of motion of the neck intact. He did have some tender musculature especially the right trapezius  Neuro exam unremarkable although his left grip strength appeared to be slightly stronger than his right (patient is right-handed)          Assessment & Plan:   Headache neck pain. Suspect more musculoligamentous. We'll treat with warm compresses gentle stretching anti-inflammatories. Prescriptions for Flexeril and a refill on his tramadol dispensed. He has been asked to obtain a home blood pressure monitor and to track home blood pressure readings and to followup with his primary care provider to rule out sustained hypertension

## 2011-07-06 NOTE — Patient Instructions (Signed)
You  may move around, but avoid painful motions and activities.  Apply heat  to the sore area for 15 to 20 minutes 3 or 4 times daily for the next two to 3 days.   Take Aleve 200 mg twice daily for pain or swelling  Limit your sodium (Salt) intake  Please check your blood pressure on a regular basis.  If it is consistently greater than 150/90, please make an office appointment.

## 2011-07-23 ENCOUNTER — Ambulatory Visit: Payer: 59 | Admitting: Internal Medicine

## 2011-07-24 ENCOUNTER — Ambulatory Visit (INDEPENDENT_AMBULATORY_CARE_PROVIDER_SITE_OTHER): Payer: 59 | Admitting: Internal Medicine

## 2011-07-24 ENCOUNTER — Encounter: Payer: Self-pay | Admitting: Internal Medicine

## 2011-07-24 DIAGNOSIS — I10 Essential (primary) hypertension: Secondary | ICD-10-CM

## 2011-07-24 DIAGNOSIS — R51 Headache: Secondary | ICD-10-CM

## 2011-07-24 MED ORDER — LISINOPRIL-HYDROCHLOROTHIAZIDE 20-25 MG PO TABS: 1.0000 | ORAL_TABLET | Freq: Every day | ORAL | Status: DC

## 2011-07-24 NOTE — Progress Notes (Signed)
  Subjective:    Patient ID: Benjamin [name], male    DOB: [birth year], 41 y.o.   MRN: [id number]  HPI  41 year old patient who is seen today to reassess his Lopressor. He was seen here earlier and blood pressure is mildly elevated. He has been tracking of blood pressures which have been consistently high with diastolics often in excess of 100. He has multiple complaints today including headache decreased urinary stream and general sense of unwellness. He describes some sinus congestion and does take Claritin-D on a regular basis.    Review of Systems  Constitutional: Positive for fatigue. Negative for fever, chills and appetite change.  HENT: Negative for hearing loss, ear pain, congestion, sore throat, trouble swallowing, neck stiffness, dental problem, voice change and tinnitus.   Eyes: Negative for pain, discharge and visual disturbance.  Respiratory: Negative for cough, chest tightness, wheezing and stridor.   Cardiovascular: Negative for chest pain, palpitations and leg swelling.  Gastrointestinal: Negative for nausea, vomiting, abdominal pain, diarrhea, constipation, blood in stool and abdominal distention.  Genitourinary: Positive for decreased urine volume. Negative for urgency, hematuria, flank pain, discharge, difficulty urinating and genital sores.  Musculoskeletal: Negative for myalgias, back pain, joint swelling, arthralgias and gait problem.  Skin: Negative for rash.  Neurological: Positive for weakness. Negative for dizziness, syncope, speech difficulty, numbness and headaches.  Hematological: Negative for adenopathy. Does not bruise/bleed easily.  Psychiatric/Behavioral: Negative for behavioral problems and dysphoric mood. The patient is not nervous/anxious.        Objective:   Physical Exam  Constitutional: He appears well-developed and well-nourished. No distress.       Blood pressure is consistently in the 160/100 range          Assessment & Plan:    Hypertension. We'll place on antihypertensive therapy. Exercise low salt diet encouraged. He will continue home blood pressure monitoring and will asked to return for a followup in 4 weeks.

## 2011-07-24 NOTE — Patient Instructions (Addendum)
Limit your sodium (Salt) intake  Please check your blood pressure on a regular basis.  If it is consistently greater than 150/90, please make an office appointment.  Return in one month for follow-up  

## 2011-08-21 ENCOUNTER — Ambulatory Visit (INDEPENDENT_AMBULATORY_CARE_PROVIDER_SITE_OTHER): Payer: 59 | Admitting: Internal Medicine

## 2011-08-21 ENCOUNTER — Encounter: Payer: Self-pay | Admitting: Internal Medicine

## 2011-08-21 VITALS — BP 110/78 | HR 72 | Temp 98.2°F | Resp 16 | Ht 69.0 in | Wt 178.0 lb

## 2011-08-21 DIAGNOSIS — I10 Essential (primary) hypertension: Secondary | ICD-10-CM

## 2011-08-21 DIAGNOSIS — L719 Rosacea, unspecified: Secondary | ICD-10-CM

## 2011-08-21 DIAGNOSIS — N400 Enlarged prostate without lower urinary tract symptoms: Secondary | ICD-10-CM

## 2011-08-21 LAB — BASIC METABOLIC PANEL
BUN: 25 mg/dL — ABNORMAL HIGH (ref 6–23)
CO2: 30 mEq/L (ref 19–32)
Calcium: 9.8 mg/dL (ref 8.4–10.5)
Chloride: 96 mEq/L (ref 96–112)
Creatinine, Ser: 1.3 mg/dL (ref 0.4–1.5)
GFR: 66.11 mL/min (ref >60.00)
Glucose, Bld: 78 mg/dL (ref 70–99)
Potassium: 3.7 mEq/L (ref 3.5–5.1)
Sodium: 136 mEq/L (ref 135–145)

## 2011-08-21 MED ORDER — VALACYCLOVIR HCL 1 G PO TABS: 500.0000 mg | ORAL_TABLET | Freq: Every day | ORAL | Status: DC

## 2011-08-21 MED ORDER — BISOPROLOL-HYDROCHLOROTHIAZIDE 5-6.25 MG PO TABS: 1.0000 | ORAL_TABLET | Freq: Every day | ORAL | Status: DC

## 2011-08-21 MED ORDER — DOXYCYCLINE HYCLATE 150 MG PO TBEC: 150.0000 mg | DELAYED_RELEASE_TABLET | Freq: Every day | ORAL | Status: DC

## 2011-08-21 NOTE — Patient Instructions (Signed)
The patient is instructed to continue all medications as prescribed. Schedule followup with check out clerk upon leaving the clinic  

## 2011-08-21 NOTE — Progress Notes (Signed)
Subjective:    Patient ID: Benjamin [name], male    DOB: [birth year], 41 y.o.   MRN: [id number]  HPI HTN stable Longer urination, no buring, early AM urination is "lengthy" He was started on medication by one of my partners for his hypertension that included a significant diuretic component. He has had a marked response to the diuretic in terms of frequent urination moderate dehydration and probably solve orthostasis.  He is a physically active Emergency planning/management officer. He is also followed for allergic rhinitis a history of benign prostatic hypertrophy which has been complicated by this medication and a history of headaches which have actually increased on this medication    Review of Systems  Constitutional: Negative for fever and fatigue.  HENT: Negative for hearing loss, congestion, neck pain and postnasal drip.   Eyes: Negative for discharge, redness and visual disturbance.  Respiratory: Negative for cough, shortness of breath and wheezing.   Cardiovascular: Negative for leg swelling.  Gastrointestinal: Negative for abdominal pain, constipation and abdominal distention.  Genitourinary: Negative for urgency and frequency.  Musculoskeletal: Negative for joint swelling and arthralgias.  Skin: Negative for color change and rash.  Neurological: Negative for weakness and light-headedness.  Hematological: Negative for adenopathy.  Psychiatric/Behavioral: Negative for behavioral problems.   Past Medical History  Diagnosis Date  . ALLERGIC RHINITIS 07/11/2008  . BACK PAIN, CHRONIC, INTERMITTENT 10/17/2008  . BENIGN PROSTATIC HYPERTROPHY, WITH URINARY OBSTRUCTION 07/21/2007  . BURSITIS, HIP 09/30/2009  . Headache 07/11/2008  . HYPERTENSION 01/05/2007  . PSORIASIS 01/05/2007  . TENDINITIS, ACHILLES 02/08/2007    History   Social History  . Marital Status: Married    Spouse Name: N/A    Number of Children: N/A  . Years of Education: N/A   Occupational History  . Not on file.   Social History  Main Topics  . Smoking status: Never Smoker   . Smokeless tobacco: Never Used  . Alcohol Use: Yes  . Drug Use: No  . Sexually Active: Not on file   Other Topics Concern  . Not on file   Social History Narrative  . No narrative on file    Past Surgical History  Procedure Date  . Lipoma excision     No family history on file.  Allergies  Allergen Reactions  . Penicillins     Current Outpatient Prescriptions on File Prior to Visit  Medication Sig Dispense Refill  . ACIPHEX 20 MG tablet TAKE 1 TABLET EVERY MORNING  30 tablet  5  . ALPRAZolam (XANAX) 0.25 MG tablet Take 1 tablet (0.25 mg total) by mouth every 6 (six) hours as needed.  50 tablet  1  . BUPAP 50-650 MG TABS TAKE ONE CAPSULE EVERY 4 TO 6 HOURS AS NEEDED  60 each  3  . cyclobenzaprine (FLEXERIL) 10 MG tablet Take 10 mg by mouth 3 (three) times daily as needed.       . loratadine-pseudoephedrine (CLARITIN-D 24-HOUR) 10-240 MG per 24 hr tablet Take 1 tablet by mouth daily.      . montelukast (SINGULAIR) 10 MG tablet Take 10 mg by mouth at bedtime.       . traMADol (ULTRAM) 50 MG tablet Take 1 tablet (50 mg total) by mouth every 6 (six) hours as needed for pain. Maximum dose= 8 tablets per day  50 tablet  1    BP 110/78  Pulse 72  Temp 98.2 F (36.8 C)  Resp 16  Ht 5\' 9"  (1.753 m)  Wt 178 lb (  80.74 kg)  BMI 26.29 kg/m2        Objective:   Physical Exam  Nursing note and vitals reviewed. Constitutional: He appears well-developed and well-nourished.  HENT:  Head: Normocephalic and atraumatic.  Eyes: Conjunctivae are normal. Pupils are equal, round, and reactive to light.  Neck: Normal range of motion. Neck supple.  Cardiovascular: Normal rate and regular rhythm.   Pulmonary/Chest: Effort normal and breath sounds normal.  Abdominal: Soft. Bowel sounds are normal.          Assessment & Plan:  We will discontinue the lisinopril 20/hydrochlorothiazide 25 and replace it with a mild beta blocker  bisoprolol . He was given careful instructions to monitor his blood pressure over the next 2-3 weeks and report back if blood pressure is not well-controlled he has paramedics [place] police [place] they can monitor his blood pressure for him on a daily basis.  He was also instructed to monitor his urinary tract symptoms to see if the lesser diuretic helps to control them, if not he will be referred to urologist for his chronic benign prostatic hypertrophy. He was given a refill on doxycycline for his rosacea

## 2011-09-15 ENCOUNTER — Ambulatory Visit (INDEPENDENT_AMBULATORY_CARE_PROVIDER_SITE_OTHER): Payer: 59 | Admitting: Family Medicine

## 2011-09-15 ENCOUNTER — Ambulatory Visit: Payer: 59

## 2011-09-15 VITALS — BP 115/82 | HR 63 | Temp 98.3°F | Resp 16 | Ht 69.0 in | Wt 181.0 lb

## 2011-09-15 DIAGNOSIS — R221 Localized swelling, mass and lump, neck: Secondary | ICD-10-CM

## 2011-09-15 DIAGNOSIS — R220 Localized swelling, mass and lump, head: Secondary | ICD-10-CM

## 2011-09-15 MED ORDER — AZITHROMYCIN 250 MG PO TABS: ORAL_TABLET | ORAL | Status: DC

## 2011-09-15 NOTE — Progress Notes (Signed)
[facility]:  Office Visit  Chief Complaint:  Chief Complaint  Patient presents with  . Neck Pain    R side of neck and swelling x 1 day    HPI: Benjamin [name] is a 42 y.o. male who complains of right sided neck pain x 1 day. + sneezing, allergies. + swallowing problems with liquids and solids. No problems with SOB, CP, wheezing.  Denies fevers, chills, ear pain, night sweats, unintentional weightloss.   Past Medical History  Diagnosis Date  . ALLERGIC RHINITIS 07/11/2008  . BACK PAIN, CHRONIC, INTERMITTENT 10/17/2008  . BENIGN PROSTATIC HYPERTROPHY, WITH URINARY OBSTRUCTION 07/21/2007  . BURSITIS, HIP 09/30/2009  . Headache 07/11/2008  . HYPERTENSION 01/05/2007  . PSORIASIS 01/05/2007  . TENDINITIS, ACHILLES 02/08/2007   Past Surgical History  Procedure Date  . Lipoma excision    History   Social History  . Marital Status: Married    Spouse Name: N/A    Number of Children: N/A  . Years of Education: N/A   Social History Main Topics  . Smoking status: Never Smoker   . Smokeless tobacco: Never Used  . Alcohol Use: Yes  . Drug Use: No  . Sexually Active: None   Other Topics Concern  . None   Social History Narrative  . None   No family history on file. Allergies  Allergen Reactions  . Penicillins   . Pepto-Bismol Swelling   Prior to Admission medications   Medication Sig Start Date End Date Taking? Authorizing Provider  ACIPHEX 20 MG tablet TAKE 1 TABLET EVERY MORNING 03/31/11  Yes Stacie Glaze, MD  ALPRAZolam Prudy Feeler) 0.25 MG tablet Take 1 tablet (0.25 mg total) by mouth every 6 (six) hours as needed. 06/12/11  Yes Stacie Glaze, MD  bisoprolol-hydrochlorothiazide [facility]) 5-6.25 MG per tablet Take 1 tablet by mouth daily. 08/21/11 08/20/12 Yes Stacie Glaze, MD  cetirizine-pseudoephedrine (ZYRTEC-D) 5-120 MG per tablet Take 1 tablet by mouth 2 (two) times daily.   Yes Historical Provider, MD  doxycycline (DORYX) 150 MG EC tablet Take 150 mg by  mouth daily.   Yes Historical Provider, MD  montelukast (SINGULAIR) 10 MG tablet Take 10 mg by mouth at bedtime.  07/04/11  Yes Historical Provider, MD  valACYclovir (VALTREX) 1000 MG tablet Take 0.5 tablets (500 mg total) by mouth daily. 08/21/11  Yes Stacie Glaze, MD  BUPAP 50-650 MG TABS TAKE ONE CAPSULE EVERY 4 TO 6 HOURS AS NEEDED 07/17/10   Stacie Glaze, MD  cyclobenzaprine (FLEXERIL) 10 MG tablet Take 10 mg by mouth 3 (three) times daily as needed.  07/06/11   Historical Provider, MD  loratadine-pseudoephedrine (CLARITIN-D 24-HOUR) 10-240 MG per 24 hr tablet Take 1 tablet by mouth daily.    Historical Provider, MD  traMADol (ULTRAM) 50 MG tablet Take 1 tablet (50 mg total) by mouth every 6 (six) hours as needed for pain. Maximum dose= 8 tablets per day 07/06/11 07/05/12  Gordy Savers, MD     ROS: The patient denies fevers, chills, night sweats, unintentional weight loss, chest pain, palpitations, wheezing, dyspnea on exertion, nausea, vomiting, abdominal pain, dysuria, hematuria, melena, numbness, weakness, or tingling. + neck pain, swelling, problems swallowing  All other systems have been reviewed and were otherwise negative with the exception of those mentioned in the HPI and as above.    PHYSICAL EXAM: Filed Vitals:   09/15/11 0912  BP: 115/82  Pulse: 63  Temp: 98.3 F (36.8 C)  Resp: 16  Spo2 100% Filed Vitals:   09/15/11 0912  Height: 5\' 9"  (1.753 m)  Weight: 181 lb (82.101 kg)   Body mass index is 26.73 kg/(m^2).  General: Alert, no acute distress HEENT:  Normocephalic, atraumatic, oropharynx patent. + neck tenderness anterior neck. TM nl.  Cardiovascular:  Regular rate and rhythm, no rubs murmurs or gallops.  No Carotid bruits, radial pulse intact. No pedal edema.  Respiratory: Clear to auscultation bilaterally.  No wheezes, rales, or rhonchi.  No cyanosis, no use of accessory musculature GI: No organomegaly, abdomen is soft and non-tender, positive bowel sounds.  No  masses. Skin: No rashes. Neurologic: Facial musculature symmetric. Psychiatric: Patient is appropriate throughout our interaction. Lymphatic: + Anterior Lymphadenopathy ON LEFT GREATER THAN RIGHT Musculoskeletal: Gait intact. Neck ROM nl.   LABS: Results for orders placed in visit on 08/21/11  BASIC METABOLIC PANEL      Component Value Range   Sodium 136  135 - 145 (mEq/L)   Potassium 3.7  3.5 - 5.1 (mEq/L)   Chloride 96  96 - 112 (mEq/L)   CO2 30  19 - 32 (mEq/L)   Glucose, Bld 78  70 - 99 (mg/dL)   BUN 25 (*) 6 - 23 (mg/dL)   Creatinine, Ser 1.3  0.4 - 1.5 (mg/dL)   Calcium 9.8  8.4 - 16.1 (mg/dL)   GFR 09.60  >45.40 (mL/min)     EKG/XRAY:   Primary read interpreted by Dr. Conley Rolls at [facility]. ? RIGHT SIDE MINIMAL SOFT TISSUE SWELLING. NEGATIVE FOR FX, DISLOCATION,FOREIGN BODY   ASSESSMENT/PLAN: Encounter Diagnosis  Name Primary?  . Neck swelling Yes   Questionable LAD secondary sinus issues.  Will presumptively treat with Z-PACK. No signs of abscess, epiglottitis, foreign body, fx, dislocation Note given for work for 4/16-4/18. Return on 4/18.  Instructions given to go to ER if CP/SOB.   Shonte Soderlund PHUONG, DO 09/15/2011 9:53 AM

## 2011-09-21 ENCOUNTER — Ambulatory Visit (INDEPENDENT_AMBULATORY_CARE_PROVIDER_SITE_OTHER): Payer: 59 | Admitting: Internal Medicine

## 2011-09-21 ENCOUNTER — Encounter: Payer: Self-pay | Admitting: Internal Medicine

## 2011-09-21 ENCOUNTER — Telehealth: Payer: Self-pay | Admitting: Internal Medicine

## 2011-09-21 VITALS — BP 130/80 | HR 72 | Temp 98.2°F | Resp 16 | Ht 69.0 in | Wt 180.0 lb

## 2011-09-21 DIAGNOSIS — G9001 Carotid sinus syncope: Secondary | ICD-10-CM

## 2011-09-21 MED ORDER — PREDNISONE 20 MG PO TABS: ORAL_TABLET | ORAL | Status: DC

## 2011-09-21 MED ORDER — METHYLPREDNISOLONE ACETATE 80 MG/ML IJ SUSP
80.0000 mg | Freq: Once | INTRAMUSCULAR | Status: AC
  Administered 2011-09-21: 80 mg via INTRAMUSCULAR

## 2011-09-21 NOTE — Patient Instructions (Signed)
The patient is instructed to continue all medications as prescribed. Schedule followup with check out clerk upon leaving the clinic  

## 2011-09-21 NOTE — Telephone Encounter (Signed)
Will you please call pt- I put him in an appointment at 3:45 this pm with dr Lovell Sheehan- thanks

## 2011-09-21 NOTE — Telephone Encounter (Signed)
Pt is aware.  

## 2011-09-21 NOTE — Telephone Encounter (Signed)
Pt was seen at urgent care on 09-15-2011 for neck swelling, pt needs to follow up with MD. Pt decline to see NP. Pt is requesting bonnie to return his call

## 2011-09-21 NOTE — Progress Notes (Signed)
Subjective:    Patient ID: Benjamin [name], male    DOB: April 18, 1971, 41 y.o.   MRN: [id number]  HPI Blood pressure improved with ziac Was seen in an urgent care for left sided adenopathy and gave a zpack He had moderate pain on the outside of the neck and never have sore throat, fever of chills. Did feels mild nausea with the neck pain. Was in the sniper competition the week end prior. The pain is in the right SCM. The pain is pronounced over the carotid bulb    Review of Systems  Constitutional: Negative for fever and fatigue.  HENT: Positive for neck pain and neck stiffness. Negative for hearing loss, ear pain, congestion, postnasal drip and tinnitus.   Eyes: Negative for discharge, redness and visual disturbance.  Respiratory: Negative for cough, shortness of breath and wheezing.   Cardiovascular: Negative for leg swelling.  Gastrointestinal: Negative for abdominal pain, constipation and abdominal distention.  Genitourinary: Negative for urgency and frequency.  Musculoskeletal: Negative for joint swelling and arthralgias.  Skin: Negative for color change and rash.  Neurological: Negative for weakness and light-headedness.  Hematological: Negative for adenopathy.  Psychiatric/Behavioral: Negative for behavioral problems.   Past Medical History  Diagnosis Date  . ALLERGIC RHINITIS 07/11/2008  . BACK PAIN, CHRONIC, INTERMITTENT 10/17/2008  . BENIGN PROSTATIC HYPERTROPHY, WITH URINARY OBSTRUCTION 07/21/2007  . BURSITIS, HIP 09/30/2009  . Headache 07/11/2008  . HYPERTENSION 01/05/2007  . PSORIASIS 01/05/2007  . TENDINITIS, ACHILLES 02/08/2007    History   Social History  . Marital Status: Married    Spouse Name: N/A    Number of Children: N/A  . Years of Education: N/A   Occupational History  . Not on file.   Social History Main Topics  . Smoking status: Never Smoker   . Smokeless tobacco: Never Used  . Alcohol Use: Yes  . Drug Use: No  . Sexually Active: Not on file    Other Topics Concern  . Not on file   Social History Narrative  . No narrative on file    Past Surgical History  Procedure Date  . Lipoma excision     No family history on file.  Allergies  Allergen Reactions  . Bismuth Subsalicylate Swelling  . Penicillins     Current Outpatient Prescriptions on File Prior to Visit  Medication Sig Dispense Refill  . bisoprolol-hydrochlorothiazide (ZIAC) 5-6.25 MG per tablet Take 1 tablet by mouth daily.  30 tablet  11  . BUPAP 50-650 MG TABS TAKE ONE CAPSULE EVERY 4 TO 6 HOURS AS NEEDED  60 each  3  . cetirizine-pseudoephedrine (ZYRTEC-D) 5-120 MG per tablet Take 1 tablet by mouth 2 (two) times daily.      . cyclobenzaprine (FLEXERIL) 10 MG tablet Take 10 mg by mouth 3 (three) times daily as needed.       . doxycycline (DORYX) 150 MG EC tablet Take 150 mg by mouth daily.      Marland Kitchen loratadine-pseudoephedrine (CLARITIN-D 24-HOUR) 10-240 MG per 24 hr tablet Take 1 tablet by mouth daily.      . montelukast (SINGULAIR) 10 MG tablet Take 10 mg by mouth at bedtime.       . ACIPHEX 20 MG tablet TAKE 1 TABLET EVERY MORNING  30 tablet  11  . buPROPion (WELLBUTRIN XL) 300 MG 24 hr tablet Take 1 tablet (300 mg total) by mouth daily.  30 tablet  5  . DISCONTD: lisinopril-hydrochlorothiazide (PRINZIDE,ZESTORETIC) 20-25 MG per tablet Take 1 tablet by  mouth daily.  90 tablet  3    BP 130/80  Pulse 72  Temp 98.2 F (36.8 C)  Resp 16  Ht 5\' 9"  (1.753 m)  Wt 180 lb (81.647 kg)  BMI 26.58 kg/m2       Objective:   Physical Exam  Nursing note and vitals reviewed. Constitutional: He appears well-developed and well-nourished.  HENT:  Head: Normocephalic and atraumatic.  Right Ear: External ear normal.  Left Ear: External ear normal.  Eyes: Conjunctivae normal are normal. Pupils are equal, round, and reactive to light.  Neck: Normal range of motion. No JVD present.       Tenderness along the sternocleidomastoid and tenderness reproducible of the  carotid bulb  Cardiovascular: Normal rate and regular rhythm.   Pulmonary/Chest: Effort normal and breath sounds normal.  Abdominal: Soft. Bowel sounds are normal.  Lymphadenopathy:    He has no cervical adenopathy.          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  We discussed the use of a steroid Dosepak and anti-inflammatories as well as ice and then heat.  His blood pressure stable his current medications I do not believe this represents hypertension I do not appreciate adenopathy nor any signs of infection

## 2011-10-08 ENCOUNTER — Other Ambulatory Visit: Payer: Self-pay | Admitting: Internal Medicine

## 2011-10-12 ENCOUNTER — Other Ambulatory Visit: Payer: Self-pay | Admitting: Internal Medicine

## 2011-10-12 ENCOUNTER — Other Ambulatory Visit: Payer: Self-pay | Admitting: *Deleted

## 2011-10-12 MED ORDER — ALPRAZOLAM 0.25 MG PO TABS: 0.2500 mg | ORAL_TABLET | Freq: Four times a day (QID) | ORAL | Status: DC | PRN

## 2011-10-23 ENCOUNTER — Encounter: Payer: Self-pay | Admitting: Internal Medicine

## 2011-10-23 ENCOUNTER — Ambulatory Visit (INDEPENDENT_AMBULATORY_CARE_PROVIDER_SITE_OTHER): Payer: 59 | Admitting: Internal Medicine

## 2011-10-23 VITALS — BP 124/88 | HR 68 | Temp 98.2°F | Resp 16 | Ht 69.0 in | Wt 178.0 lb

## 2011-10-23 DIAGNOSIS — Z733 Stress, not elsewhere classified: Secondary | ICD-10-CM

## 2011-10-23 DIAGNOSIS — M549 Dorsalgia, unspecified: Secondary | ICD-10-CM

## 2011-10-23 DIAGNOSIS — F439 Reaction to severe stress, unspecified: Secondary | ICD-10-CM

## 2011-10-23 DIAGNOSIS — I10 Essential (primary) hypertension: Secondary | ICD-10-CM

## 2011-10-23 DIAGNOSIS — F909 Attention-deficit hyperactivity disorder, unspecified type: Secondary | ICD-10-CM

## 2011-10-23 DIAGNOSIS — R51 Headache: Secondary | ICD-10-CM

## 2011-10-23 MED ORDER — BUPROPION HCL ER (XL) 150 MG PO TB24: 150.0000 mg | ORAL_TABLET | Freq: Every day | ORAL | Status: DC

## 2011-10-23 NOTE — Patient Instructions (Signed)
The patient is instructed to continue all medications as prescribed. Schedule followup with check out clerk upon leaving the clinic  

## 2011-10-23 NOTE — Progress Notes (Signed)
Subjective:    Patient ID: Benjamin [name], male    DOB: 1971/04/13, 41 y.o.   MRN: [id number]  HPI The pt has been under increased stress with care giving for grandmother. Increased stress and anxiety Decreased drive and relationship issues Patient's son has ADD and he believes he has many characteristics of ADD with an inability to finish projects and increasing distraction from stress in his life.  His blood pressure is well controlled with current medications the use of Flexeril for the neck has not helped with the headaches   Review of Systems  Constitutional: Negative for fever and fatigue.  HENT: Negative for hearing loss, congestion, neck pain and postnasal drip.   Eyes: Negative for discharge, redness and visual disturbance.  Respiratory: Negative for cough, shortness of breath and wheezing.   Cardiovascular: Negative for leg swelling.  Gastrointestinal: Negative for abdominal pain, constipation and abdominal distention.  Genitourinary: Negative for urgency and frequency.  Musculoskeletal: Negative for joint swelling and arthralgias.  Skin: Negative for color change and rash.  Neurological: Positive for headaches. Negative for weakness and light-headedness.  Hematological: Negative for adenopathy.  Psychiatric/Behavioral: Negative for behavioral problems.   Past Medical History  Diagnosis Date  . ALLERGIC RHINITIS 07/11/2008  . BACK PAIN, CHRONIC, INTERMITTENT 10/17/2008  . BENIGN PROSTATIC HYPERTROPHY, WITH URINARY OBSTRUCTION 07/21/2007  . BURSITIS, HIP 09/30/2009  . Headache 07/11/2008  . HYPERTENSION 01/05/2007  . PSORIASIS 01/05/2007  . TENDINITIS, ACHILLES 02/08/2007    History   Social History  . Marital Status: Married    Spouse Name: N/A    Number of Children: N/A  . Years of Education: N/A   Occupational History  . Not on file.   Social History Main Topics  . Smoking status: Never Smoker   . Smokeless tobacco: Never Used  . Alcohol Use: Yes  . Drug Use:  No  . Sexually Active: Not on file   Other Topics Concern  . Not on file   Social History Narrative  . No narrative on file    Past Surgical History  Procedure Date  . Lipoma excision     No family history on file.  Allergies  Allergen Reactions  . Bismuth Subsalicylate Swelling  . Penicillins     Current Outpatient Prescriptions on File Prior to Visit  Medication Sig Dispense Refill  . ACIPHEX 20 MG tablet TAKE 1 TABLET EVERY MORNING  30 tablet  11  . ALPRAZolam (XANAX) 0.25 MG tablet Take 1 tablet (0.25 mg total) by mouth every 6 (six) hours as needed.  50 tablet  5  . bisoprolol-hydrochlorothiazide (ZIAC) 5-6.25 MG per tablet Take 1 tablet by mouth daily.  30 tablet  11  . BUPAP 50-650 MG TABS TAKE ONE CAPSULE EVERY 4 TO 6 HOURS AS NEEDED  60 each  3  . cetirizine-pseudoephedrine (ZYRTEC-D) 5-120 MG per tablet Take 1 tablet by mouth 2 (two) times daily.      . cyclobenzaprine (FLEXERIL) 10 MG tablet Take 10 mg by mouth 3 (three) times daily as needed.       . doxycycline (DORYX) 150 MG EC tablet Take 150 mg by mouth daily.      Marland Kitchen loratadine-pseudoephedrine (CLARITIN-D 24-HOUR) 10-240 MG per 24 hr tablet Take 1 tablet by mouth daily.      . montelukast (SINGULAIR) 10 MG tablet Take 10 mg by mouth at bedtime.       . traMADol (ULTRAM) 50 MG tablet TAKE 1 TABLET EVERY 6 HOURS AS NEEDED  FOR PAIN (MAX 8 PER DAY)  50 tablet  1  . valACYclovir (VALTREX) 1000 MG tablet Take 0.5 tablets (500 mg total) by mouth daily.  15 tablet  11  . buPROPion (WELLBUTRIN XL) 150 MG 24 hr tablet Take 1 tablet (150 mg total) by mouth daily.  30 tablet  1  . DISCONTD: lisinopril-hydrochlorothiazide (PRINZIDE,ZESTORETIC) 20-25 MG per tablet Take 1 tablet by mouth daily.  90 tablet  3    BP 124/88  Pulse 68  Temp 98.2 F (36.8 C)  Resp 16  Ht 5\' 9"  (1.753 m)  Wt 178 lb (80.74 kg)  BMI 26.29 kg/m2       Objective:   Physical Exam  Nursing note and vitals reviewed. Constitutional: He  appears well-developed and well-nourished.  HENT:  Head: Normocephalic and atraumatic.  Eyes: Conjunctivae are normal. Pupils are equal, round, and reactive to light.  Neck: Normal range of motion. Neck supple.  Cardiovascular: Normal rate and regular rhythm.   Pulmonary/Chest: Effort normal and breath sounds normal.  Abdominal: Soft. Bowel sounds are normal.          Assessment & Plan:  Probable stress reaction complicated by ADD and a long-standing migraine/cluster headache pattern.  He also has history of asthma and mild hypertension.  Blood pressure is well controlled with current medications I suspect Wellbutrin would be the drug of choice for the EGD and stress reaction.  We'll start him on 150 mg and titrated as needed we will also refer to see Judithe Modest help him with relationship stresses  I have spent more than 30 minutes examining this patient face-to-face of which over half was spent in counseling

## 2011-12-25 ENCOUNTER — Encounter: Payer: Self-pay | Admitting: Internal Medicine

## 2011-12-25 ENCOUNTER — Ambulatory Visit (INDEPENDENT_AMBULATORY_CARE_PROVIDER_SITE_OTHER): Payer: 59 | Admitting: Internal Medicine

## 2011-12-25 VITALS — BP 120/82 | HR 68 | Temp 98.2°F | Resp 16 | Ht 69.0 in | Wt 176.0 lb

## 2011-12-25 DIAGNOSIS — F988 Other specified behavioral and emotional disorders with onset usually occurring in childhood and adolescence: Secondary | ICD-10-CM

## 2011-12-25 DIAGNOSIS — L2089 Other atopic dermatitis: Secondary | ICD-10-CM

## 2011-12-25 DIAGNOSIS — L209 Atopic dermatitis, unspecified: Secondary | ICD-10-CM

## 2011-12-25 MED ORDER — BUPROPION HCL ER (XL) 300 MG PO TB24
300.0000 mg | ORAL_TABLET | Freq: Every day | ORAL | Status: DC
Start: 2011-12-25 — End: 2012-11-05

## 2011-12-25 MED ORDER — MOMETASONE FUROATE 0.1 % EX CREA: TOPICAL_CREAM | Freq: Every day | CUTANEOUS | Status: AC

## 2011-12-25 NOTE — Patient Instructions (Addendum)
The patient is instructed to continue all medications as prescribed. Schedule followup with check out clerk upon leaving the clinic  

## 2011-12-25 NOTE — Progress Notes (Signed)
Subjective:    Patient ID: Benjamin [name], male    DOB: [birth year], 41 y.o.   MRN: [id number]  HPI    Review of Systems  Constitutional: Negative for fever and fatigue.  HENT: Negative for hearing loss, congestion, neck pain and postnasal drip.   Eyes: Negative for discharge, redness and visual disturbance.  Respiratory: Negative for cough, shortness of breath and wheezing.   Cardiovascular: Negative for leg swelling.  Gastrointestinal: Negative for abdominal pain, constipation and abdominal distention.  Genitourinary: Negative for urgency and frequency.  Musculoskeletal: Negative for joint swelling and arthralgias.  Skin: Negative for color change and rash.  Neurological: Negative for weakness and light-headedness.  Hematological: Negative for adenopathy.  Psychiatric/Behavioral: Negative for behavioral problems.   Past Medical History  Diagnosis Date  . ALLERGIC RHINITIS 07/11/2008  . BACK PAIN, CHRONIC, INTERMITTENT 10/17/2008  . BENIGN PROSTATIC HYPERTROPHY, WITH URINARY OBSTRUCTION 07/21/2007  . BURSITIS, HIP 09/30/2009  . Headache 07/11/2008  . HYPERTENSION 01/05/2007  . PSORIASIS 01/05/2007  . TENDINITIS, ACHILLES 02/08/2007    History   Social History  . Marital Status: Married    Spouse Name: N/A    Number of Children: N/A  . Years of Education: N/A   Occupational History  . Not on file.   Social History Main Topics  . Smoking status: Never Smoker   . Smokeless tobacco: Never Used  . Alcohol Use: Yes  . Drug Use: No  . Sexually Active: Not on file   Other Topics Concern  . Not on file   Social History Narrative  . No narrative on file    Past Surgical History  Procedure Date  . Lipoma excision     No family history on file.  Allergies  Allergen Reactions  . Bismuth Subsalicylate Swelling  . Penicillins     Current Outpatient Prescriptions on File Prior to Visit  Medication Sig Dispense Refill  . ACIPHEX 20 MG tablet TAKE 1 TABLET EVERY MORNING   30 tablet  11  . ALPRAZolam (XANAX) 0.25 MG tablet Take 1 tablet (0.25 mg total) by mouth every 6 (six) hours as needed.  50 tablet  5  . bisoprolol-hydrochlorothiazide (ZIAC) 5-6.25 MG per tablet Take 1 tablet by mouth daily.  30 tablet  11  . BUPAP 50-650 MG TABS TAKE ONE CAPSULE EVERY 4 TO 6 HOURS AS NEEDED  60 each  3  . cetirizine-pseudoephedrine (ZYRTEC-D) 5-120 MG per tablet Take 1 tablet by mouth 2 (two) times daily.      . cyclobenzaprine (FLEXERIL) 10 MG tablet Take 10 mg by mouth 3 (three) times daily as needed.       . doxycycline (DORYX) 150 MG EC tablet Take 150 mg by mouth daily.      Marland Kitchen loratadine-pseudoephedrine (CLARITIN-D 24-HOUR) 10-240 MG per 24 hr tablet Take 1 tablet by mouth daily.      . montelukast (SINGULAIR) 10 MG tablet Take 10 mg by mouth at bedtime.       . traMADol (ULTRAM) 50 MG tablet TAKE 1 TABLET EVERY 6 HOURS AS NEEDED FOR PAIN (MAX 8 PER DAY)  50 tablet  1  . valACYclovir (VALTREX) 1000 MG tablet Take 0.5 tablets (500 mg total) by mouth daily.  15 tablet  11  . DISCONTD: buPROPion (WELLBUTRIN XL) 150 MG 24 hr tablet Take 1 tablet (150 mg total) by mouth daily.  30 tablet  1  . DISCONTD: lisinopril-hydrochlorothiazide (PRINZIDE,ZESTORETIC) 20-25 MG per tablet Take 1 tablet by mouth daily.  90 tablet  3    BP 120/82  Pulse 68  Temp 98.2 F (36.8 C)  Resp 16  Ht 5\' 9"  (1.753 m)  Wt 176 lb (79.833 kg)  BMI 25.99 kg/m2        Objective:   Physical Exam  Nursing note and vitals reviewed. Constitutional: He appears well-developed and well-nourished.  HENT:  Head: Normocephalic and atraumatic.  Eyes: Conjunctivae are normal. Pupils are equal, round, and reactive to light.  Neck: Normal range of motion. Neck supple.  Cardiovascular: Normal rate and regular rhythm.   Pulmonary/Chest: Effort normal and breath sounds normal.  Abdominal: Soft. Bowel sounds are normal.     Violaceous rash on the left elbow that blanches to touch     Assessment &  Plan:  Interval improvement Increase the dose to 300 mg by mouth daily Begin Elocon lotion to the site of an atopic dermatitis on his left elbow

## 2012-01-04 ENCOUNTER — Other Ambulatory Visit: Payer: Self-pay | Admitting: Internal Medicine

## 2012-02-02 ENCOUNTER — Other Ambulatory Visit: Payer: Self-pay | Admitting: Internal Medicine

## 2012-03-07 ENCOUNTER — Ambulatory Visit: Payer: 59 | Admitting: Internal Medicine

## 2012-05-17 ENCOUNTER — Encounter: Payer: Self-pay | Admitting: Internal Medicine

## 2012-05-17 ENCOUNTER — Ambulatory Visit (INDEPENDENT_AMBULATORY_CARE_PROVIDER_SITE_OTHER): Payer: 59 | Admitting: Internal Medicine

## 2012-05-17 VITALS — BP 116/84 | Temp 98.0°F | Wt 180.0 lb

## 2012-05-17 DIAGNOSIS — B009 Herpesviral infection, unspecified: Secondary | ICD-10-CM

## 2012-05-17 DIAGNOSIS — J069 Acute upper respiratory infection, unspecified: Secondary | ICD-10-CM

## 2012-05-17 DIAGNOSIS — B001 Herpesviral vesicular dermatitis: Secondary | ICD-10-CM | POA: Insufficient documentation

## 2012-05-17 MED ORDER — ACYCLOVIR 400 MG PO TABS: 400.0000 mg | ORAL_TABLET | Freq: Three times a day (TID) | ORAL | Status: DC

## 2012-05-17 NOTE — Assessment & Plan Note (Signed)
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 He has flares especially with upper respiratory infections. Refill for acyclovir provided.

## 2012-05-17 NOTE — Patient Instructions (Addendum)
Use over the counter ranitidine 150 mg twice daily as needed Avoid ibuprofen Increase fluid intake and get plenty of rest Please call our office if your symptoms do not improve or gets worse.

## 2012-05-17 NOTE — Progress Notes (Signed)
Subjective:    Patient ID: Benjamin [name], male    DOB: 11-29-1970, 41 y.o.   MRN: [id number]  HPI  41 year old white male complains of laryngitis, nasal congestion and cough for the last one week. Symptoms started off with severe sore throat. Patient had some left over amoxicillin from his son's prescription. Patient took a few doses and sore throat seemed to improve. He denies any fever or shaking chills. He denies any shortness of breath. He had some mild nausea last night with vomiting last night. He has been taking ibuprofen every 6-8 hours for aches and pains.  Review of Systems Negative for shortness of breath, negative for fevers He still has mild laryngitis  Past Medical History  Diagnosis Date  . ALLERGIC RHINITIS 07/11/2008  . BACK PAIN, CHRONIC, INTERMITTENT 10/17/2008  . BENIGN PROSTATIC HYPERTROPHY, WITH URINARY OBSTRUCTION 07/21/2007  . BURSITIS, HIP 09/30/2009  . Headache 07/11/2008  . HYPERTENSION 01/05/2007  . PSORIASIS 01/05/2007  . TENDINITIS, ACHILLES 02/08/2007    History   Social History  . Marital Status: Married    Spouse Name: N/A    Number of Children: N/A  . Years of Education: N/A   Occupational History  . Not on file.   Social History Main Topics  . Smoking status: Never Smoker   . Smokeless tobacco: Never Used  . Alcohol Use: Yes  . Drug Use: No  . Sexually Active: Not on file   Other Topics Concern  . Not on file   Social History Narrative  . No narrative on file    Past Surgical History  Procedure Date  . Lipoma excision     No family history on file.  Allergies  Allergen Reactions  . Bismuth Subsalicylate Swelling  . Penicillins     Current Outpatient Prescriptions on File Prior to Visit  Medication Sig Dispense Refill  . ACIPHEX 20 MG tablet TAKE 1 TABLET EVERY MORNING  30 tablet  11  . ALPRAZolam (XANAX) 0.25 MG tablet Take 1 tablet (0.25 mg total) by mouth every 6 (six) hours as needed.  50 tablet  5  .  bisoprolol-hydrochlorothiazide (ZIAC) 5-6.25 MG per tablet Take 1 tablet by mouth daily.  30 tablet  11  . BUPAP 50-650 MG TABS TAKE ONE CAPSULE EVERY 4 TO 6 HOURS AS NEEDED  60 each  3  . buPROPion (WELLBUTRIN XL) 300 MG 24 hr tablet Take 1 tablet (300 mg total) by mouth daily.  30 tablet  5  . cetirizine-pseudoephedrine (ZYRTEC-D) 5-120 MG per tablet Take 1 tablet by mouth 2 (two) times daily.      . cyclobenzaprine (FLEXERIL) 10 MG tablet Take 10 mg by mouth 3 (three) times daily as needed.       . doxycycline (DORYX) 150 MG EC tablet Take 150 mg by mouth daily.      Marland Kitchen loratadine-pseudoephedrine (CLARITIN-D 24-HOUR) 10-240 MG per 24 hr tablet Take 1 tablet by mouth daily.      . mometasone (ELOCON) 0.1 % cream Apply topically daily.  45 g  1  . montelukast (SINGULAIR) 10 MG tablet Take 10 mg by mouth at bedtime.       . traMADol (ULTRAM) 50 MG tablet TAKE 1 TABLET EVERY 6 HOURS AS NEEDED FOR PAIN (MAX 8 PER DAY)  50 tablet  0  . [DISCONTINUED] lisinopril-hydrochlorothiazide (PRINZIDE,ZESTORETIC) 20-25 MG per tablet Take 1 tablet by mouth daily.  90 tablet  3    BP 116/84  Temp 98 F (36.7  C) (Oral)  Wt 180 lb (81.647 kg)       Objective:   Physical Exam  Constitutional: He appears well-developed and well-nourished.  HENT:  Head: Normocephalic and atraumatic.  Right Ear: External ear normal.  Left Ear: External ear normal.  Mouth/Throat: No oropharyngeal exudate.       Oropharyngeal erythema  Neck: Neck supple.       No neck tenderness  Cardiovascular: Normal rate, regular rhythm and normal heart sounds.   Pulmonary/Chest: Effort normal and breath sounds normal. He has no wheezes.  Skin: Skin is warm and dry.  Psychiatric: He has a normal mood and affect. His behavior is normal.          Assessment & Plan:

## 2012-05-17 NOTE — Assessment & Plan Note (Addendum)
41 year old white male with signs and symptoms of viral upper respiratory infection. Recommended symptomatic treatment.  Patient advised to call office if symptoms persist or worsen.  Patient had single episode of nausea and vomiting which may have been associated with NSAID use. Patient advised to discontinue ibuprofen. Use Tylenol as needed for aches and pains.

## 2012-07-11 ENCOUNTER — Telehealth: Payer: Self-pay | Admitting: Internal Medicine

## 2012-07-11 ENCOUNTER — Other Ambulatory Visit: Payer: Self-pay | Admitting: *Deleted

## 2012-07-11 MED ORDER — TRAMADOL HCL 50 MG PO TABS: ORAL_TABLET | ORAL | Status: DC

## 2012-07-11 NOTE — Telephone Encounter (Signed)
Pt needs refill of traMADol (ULTRAM) 50 MG tablet. Pt has follow up appt On April 4,2014 Pharm: [facility]/ [address]

## 2012-08-02 ENCOUNTER — Other Ambulatory Visit: Payer: Self-pay | Admitting: Internal Medicine

## 2012-08-26 ENCOUNTER — Ambulatory Visit (INDEPENDENT_AMBULATORY_CARE_PROVIDER_SITE_OTHER): Payer: 59 | Admitting: Family Medicine

## 2012-08-26 ENCOUNTER — Ambulatory Visit: Payer: 59

## 2012-08-26 VITALS — BP 124/76 | HR 88 | Temp 98.3°F | Resp 17 | Ht 69.0 in | Wt 185.0 lb

## 2012-08-26 DIAGNOSIS — M79609 Pain in unspecified limb: Secondary | ICD-10-CM

## 2012-08-26 DIAGNOSIS — M25571 Pain in right ankle and joints of right foot: Secondary | ICD-10-CM

## 2012-08-26 DIAGNOSIS — R21 Rash and other nonspecific skin eruption: Secondary | ICD-10-CM

## 2012-08-26 LAB — COMPREHENSIVE METABOLIC PANEL
ALT: 19 U/L (ref 0–53)
AST: 18 U/L (ref 0–37)
Albumin: 4.3 g/dL (ref 3.5–5.2)
Alkaline Phosphatase: 57 U/L (ref 39–117)
BUN: 21 mg/dL (ref 6–23)
CO2: 21 mEq/L (ref 19–32)
Calcium: 9.5 mg/dL (ref 8.4–10.5)
Chloride: 103 mEq/L (ref 96–112)
Creat: 1.25 mg/dL (ref 0.50–1.35)
Glucose, Bld: 82 mg/dL (ref 70–99)
Potassium: 4.1 mEq/L (ref 3.5–5.3)
Sodium: 138 mEq/L (ref 135–145)
Total Bilirubin: 0.5 mg/dL (ref 0.3–1.2)
Total Protein: 7.1 g/dL (ref 6.0–8.3)

## 2012-08-26 LAB — URIC ACID: Uric Acid, Serum: 7.3 mg/dL (ref 4.0–7.8)

## 2012-08-26 MED ORDER — INDOMETHACIN 50 MG PO CAPS: 50.0000 mg | ORAL_CAPSULE | Freq: Three times a day (TID) | ORAL | Status: DC

## 2012-08-26 MED ORDER — DOXYCYCLINE HYCLATE 150 MG PO TBEC: 150.0000 mg | DELAYED_RELEASE_TABLET | Freq: Every day | ORAL | Status: DC

## 2012-08-26 NOTE — Progress Notes (Addendum)
42 yo [employer] individual with spontaneous right great toe pain, sensitive to touch.  No h/o gout.  Not taking thiazides. No f/h gout  Objective:  NAD Tender, swollen slightly erythematous right MTP joint of great toe.  [facility] reading (PRIMARY) by  Dr. Milus Glazier. Negative great toe  Toe joint pain, right - Plan: DG Toe Great Right, Uric Acid, Comprehensive metabolic panel, indomethacin (INDOCIN) 50 MG capsule  Rash and nonspecific skin eruption - Plan: doxycycline (DORYX) 150 MG EC tablet

## 2012-08-26 NOTE — Patient Instructions (Addendum)
Gout Gout is an inflammatory condition (arthritis) caused by a buildup of uric acid crystals in the joints. Uric acid is a chemical that is normally present in the blood. Under some circumstances, uric acid can form into crystals in your joints. This causes joint redness, soreness, and swelling (inflammation). Repeat attacks are common. Over time, uric acid crystals can form into masses (tophi) near a joint, causing disfigurement. Gout is treatable and often preventable. CAUSES  The disease begins with elevated levels of uric acid in the blood. Uric acid is produced by your body when it breaks down a naturally found substance called purines. This also happens when you eat certain foods such as meats and fish. Causes of an elevated uric acid level include:  Being passed down from parent to child (heredity).  Diseases that cause increased uric acid production (obesity, psoriasis, some cancers).  Excessive alcohol use.  Diet, especially diets rich in meat and seafood.  Medicines, including certain cancer-fighting drugs (chemotherapy), diuretics, and aspirin.  Chronic kidney disease. The kidneys are no longer able to remove uric acid well.  Problems with metabolism. Conditions strongly associated with gout include:  Obesity.  High blood pressure.  High cholesterol.  Diabetes. Not everyone with elevated uric acid levels gets gout. It is not understood why some people get gout and others do not. Surgery, joint injury, and eating too much of certain foods are some of the factors that can lead to gout. SYMPTOMS   An attack of gout comes on quickly. It causes intense pain with redness, swelling, and warmth in a joint.  Fever can occur.  Often, only one joint is involved. Certain joints are more commonly involved:  Base of the big toe.  Knee.  Ankle.  Wrist.  Finger. Without treatment, an attack usually goes away in a few days to weeks. Between attacks, you usually will not have  symptoms, which is different from many other forms of arthritis. DIAGNOSIS  Your caregiver will suspect gout based on your symptoms and exam. Removal of fluid from the joint (arthrocentesis) is done to check for uric acid crystals. Your caregiver will give you a medicine that numbs the area (local anesthetic) and use a needle to remove joint fluid for exam. Gout is confirmed when uric acid crystals are seen in joint fluid, using a special microscope. Sometimes, blood, urine, and X-ray tests are also used. TREATMENT  There are 2 phases to gout treatment: treating the sudden onset (acute) attack and preventing attacks (prophylaxis). Treatment of an Acute Attack  Medicines are used. These include anti-inflammatory medicines or steroid medicines.  An injection of steroid medicine into the affected joint is sometimes necessary.  The painful joint is rested. Movement can worsen the arthritis.  You may use warm or cold treatments on painful joints, depending which works best for you.  Discuss the use of coffee, vitamin C, or cherries with your caregiver. These may be helpful treatment options. Treatment to Prevent Attacks After the acute attack subsides, your caregiver may advise prophylactic medicine. These medicines either help your kidneys eliminate uric acid from your body or decrease your uric acid production. You may need to stay on these medicines for a very long time. The early phase of treatment with prophylactic medicine can be associated with an increase in acute gout attacks. For this reason, during the first few months of treatment, your caregiver may also advise you to take medicines usually used for acute gout treatment. Be sure you understand your caregiver's directions.   You should also discuss dietary treatment with your caregiver. Certain foods such as meats and fish can increase uric acid levels. Other foods such as dairy can decrease levels. Your caregiver can give you a list of foods  to avoid. HOME CARE INSTRUCTIONS   Do not take aspirin to relieve pain. This raises uric acid levels.  Only take over-the-counter or prescription medicines for pain, discomfort, or fever as directed by your caregiver.  Rest the joint as much as possible. When in bed, keep sheets and blankets off painful areas.  Keep the affected joint raised (elevated).  Use crutches if the painful joint is in your leg.  Drink enough water and fluids to keep your urine clear or pale yellow. This helps your body get rid of uric acid. Do not drink alcoholic beverages. They slow the passage of uric acid.  Follow your caregiver's dietary instructions. Pay careful attention to the amount of protein you eat. Your daily diet should emphasize fruits, vegetables, whole grains, and fat-free or low-fat milk products.  Maintain a healthy body weight. SEEK MEDICAL CARE IF:   You have an oral temperature above 102 F (38.9 C).  You develop diarrhea, vomiting, or any side effects from medicines.  You do not feel better in 24 hours, or you are getting worse. SEEK IMMEDIATE MEDICAL CARE IF:   Your joint becomes suddenly more tender and you have:  Chills.  An oral temperature above 102 F (38.9 C), not controlled by medicine. MAKE SURE YOU:   Understand these instructions.  Will watch your condition.  Will get help right away if you are not doing well or get worse. Document Released: 05/15/2000 Document Revised: 08/10/2011 Document Reviewed: 08/26/2009 [employer] Patient Information 2013 [employer].    

## 2012-09-02 ENCOUNTER — Encounter: Payer: Self-pay | Admitting: Internal Medicine

## 2012-09-02 ENCOUNTER — Ambulatory Visit (INDEPENDENT_AMBULATORY_CARE_PROVIDER_SITE_OTHER): Payer: 59 | Admitting: Internal Medicine

## 2012-09-02 VITALS — BP 120/80 | HR 72 | Temp 98.3°F | Resp 16 | Ht 69.0 in | Wt 184.0 lb

## 2012-09-02 DIAGNOSIS — K219 Gastro-esophageal reflux disease without esophagitis: Secondary | ICD-10-CM

## 2012-09-02 DIAGNOSIS — A609 Anogenital herpesviral infection, unspecified: Secondary | ICD-10-CM

## 2012-09-02 DIAGNOSIS — B351 Tinea unguium: Secondary | ICD-10-CM

## 2012-09-02 DIAGNOSIS — G43909 Migraine, unspecified, not intractable, without status migrainosus: Secondary | ICD-10-CM

## 2012-09-02 MED ORDER — ESOMEPRAZOLE MAGNESIUM 40 MG PO CPDR: 40.0000 mg | DELAYED_RELEASE_CAPSULE | Freq: Every day | ORAL | Status: DC

## 2012-09-02 MED ORDER — SUMATRIPTAN-NAPROXEN SODIUM 85-500 MG PO TABS: 1.0000 | ORAL_TABLET | ORAL | Status: DC | PRN

## 2012-09-02 MED ORDER — VALACYCLOVIR HCL 500 MG PO TABS: 500.0000 mg | ORAL_TABLET | Freq: Every day | ORAL | Status: DC

## 2012-09-02 NOTE — Patient Instructions (Addendum)
Check out increasing shoe by 1/2 size Consider arch support   1 cup of white vinegar in 1 gallon warm water soak for 15 min three time a week

## 2012-09-02 NOTE — Progress Notes (Signed)
Subjective:    Patient ID: Benjamin [name], male    DOB: [birth year], 42 y.o.   MRN: [id number]  HPI Chronic head aches and migraine pattern . Prodrome for these head aches with light sensitivity. Weekly events Was on Bupap and tramadol The pattern change has been over the last few weeks     Review of Systems  Constitutional: Negative for fever and fatigue.  HENT: Negative for hearing loss, congestion, neck pain and postnasal drip.   Eyes: Negative for discharge, redness and visual disturbance.  Respiratory: Negative for cough, shortness of breath and wheezing.   Cardiovascular: Negative for leg swelling.  Gastrointestinal: Negative for abdominal pain, constipation and abdominal distention.  Genitourinary: Negative for urgency and frequency.  Musculoskeletal: Negative for joint swelling and arthralgias.  Skin: Negative for color change and rash.  Neurological: Positive for headaches. Negative for weakness and light-headedness.  Hematological: Negative for adenopathy.  Psychiatric/Behavioral: Negative for behavioral problems.   Past Medical History  Diagnosis Date  . ALLERGIC RHINITIS 07/11/2008  . BACK PAIN, CHRONIC, INTERMITTENT 10/17/2008  . BENIGN PROSTATIC HYPERTROPHY, WITH URINARY OBSTRUCTION 07/21/2007  . BURSITIS, HIP 09/30/2009  . Headache 07/11/2008  . HYPERTENSION 01/05/2007  . PSORIASIS 01/05/2007  . TENDINITIS, ACHILLES 02/08/2007    History   Social History  . Marital Status: Married    Spouse Name: N/A    Number of Children: N/A  . Years of Education: N/A   Occupational History  . Not on file.   Social History Main Topics  . Smoking status: Never Smoker   . Smokeless tobacco: Never Used  . Alcohol Use: Yes  . Drug Use: No  . Sexually Active: Yes    Birth Control/ Protection: None   Other Topics Concern  . Not on file   Social History Narrative  . No narrative on file    Past Surgical History  Procedure Laterality Date  . Lipoma excision      No  family history on file.  Allergies  Allergen Reactions  . Bismuth Subsalicylate Swelling  . Penicillins     Current Outpatient Prescriptions on File Prior to Visit  Medication Sig Dispense Refill  . ACIPHEX 20 MG tablet TAKE 1 TABLET EVERY MORNING  30 tablet  11  . acyclovir (ZOVIRAX) 400 MG tablet Take 1 tablet (400 mg total) by mouth 3 (three) times daily.  270 tablet  1  . bisoprolol (ZEBETA) 10 MG tablet Take 10 mg by mouth daily.      Marland Kitchen buPROPion (WELLBUTRIN XL) 300 MG 24 hr tablet Take 1 tablet (300 mg total) by mouth daily.  30 tablet  5  . cetirizine-pseudoephedrine (ZYRTEC-D) 5-120 MG per tablet Take 1 tablet by mouth 2 (two) times daily.      Marland Kitchen doxycycline (DORYX) 150 MG EC tablet Take 1 tablet (150 mg total) by mouth daily.  90 tablet  3  . indomethacin (INDOCIN) 50 MG capsule Take 1 capsule (50 mg total) by mouth 3 (three) times daily with meals.  30 capsule  1  . mometasone (ELOCON) 0.1 % cream Apply topically daily.  45 g  1  . traMADol (ULTRAM) 50 MG tablet TAKE 1 TABLET EVERY 6 HOURS AS NEEDED FOR PAIN (MAX 8 PER DAY)  50 tablet  1  . [DISCONTINUED] lisinopril-hydrochlorothiazide (PRINZIDE,ZESTORETIC) 20-25 MG per tablet Take 1 tablet by mouth daily.  90 tablet  3   No current facility-administered medications on file prior to visit.    BP 120/80  Pulse  72  Temp(Src) 98.3 F (36.8 C)  Resp 16  Ht 5\' 9"  (1.753 m)  Wt 184 lb (83.462 kg)  BMI 27.16 kg/m2       Objective:   Physical Exam  Nursing note and vitals reviewed. Constitutional: He is oriented to person, place, and time. He appears well-developed and well-nourished.  HENT:  Head: Normocephalic and atraumatic.  Eyes: Conjunctivae are normal. Pupils are equal, round, and reactive to light.  Neck: Normal range of motion. Neck supple.  Cardiovascular: Normal rate and regular rhythm.   Pulmonary/Chest: Effort normal and breath sounds normal.  Abdominal: Soft. Bowel sounds are normal.  Neurological: He is  alert and oriented to person, place, and time.  Skin: Skin is warm and dry.          Assessment & Plan:  Migraine pattern  Was diagnosed with gout at urgent care osteoarthrits and tendonitis in the foot  [place]

## 2012-09-12 ENCOUNTER — Other Ambulatory Visit: Payer: Self-pay | Admitting: Internal Medicine

## 2012-11-05 ENCOUNTER — Other Ambulatory Visit: Payer: Self-pay | Admitting: Internal Medicine

## 2012-12-12 ENCOUNTER — Encounter: Payer: Self-pay | Admitting: Internal Medicine

## 2012-12-12 ENCOUNTER — Ambulatory Visit (INDEPENDENT_AMBULATORY_CARE_PROVIDER_SITE_OTHER): Payer: 59 | Admitting: Internal Medicine

## 2012-12-12 VITALS — BP 120/84 | HR 72 | Temp 98.2°F | Resp 16 | Ht 69.0 in | Wt 186.0 lb

## 2012-12-12 DIAGNOSIS — G44009 Cluster headache syndrome, unspecified, not intractable: Secondary | ICD-10-CM

## 2012-12-12 DIAGNOSIS — L719 Rosacea, unspecified: Secondary | ICD-10-CM

## 2012-12-12 MED ORDER — CEFUROXIME AXETIL 250 MG PO TABS: 250.0000 mg | ORAL_TABLET | Freq: Every day | ORAL | Status: DC

## 2012-12-12 MED ORDER — TRAMADOL HCL 50 MG PO TABS: ORAL_TABLET | ORAL | Status: DC

## 2012-12-12 NOTE — Progress Notes (Signed)
Subjective:    Patient ID: Benjamin [name], male    DOB: 01-Oct-1970, 42 y.o.   MRN: [id number]  HPI The head ache pattern has responded to the tramadol The patient has not required any other medications refill  Review of Systems  Constitutional: Negative for fever and fatigue.  HENT: Negative for hearing loss, congestion, neck pain and postnasal drip.   Eyes: Negative for discharge, redness and visual disturbance.  Respiratory: Negative for cough, shortness of breath and wheezing.   Cardiovascular: Negative for leg swelling.  Gastrointestinal: Negative for abdominal pain, constipation and abdominal distention.  Genitourinary: Negative for urgency and frequency.  Musculoskeletal: Negative for joint swelling and arthralgias.  Skin: Negative for color change and rash.  Neurological: Negative for weakness and light-headedness.  Hematological: Negative for adenopathy.  Psychiatric/Behavioral: Negative for behavioral problems.   Past Medical History  Diagnosis Date  . ALLERGIC RHINITIS 07/11/2008  . BACK PAIN, CHRONIC, INTERMITTENT 10/17/2008  . BENIGN PROSTATIC HYPERTROPHY, WITH URINARY OBSTRUCTION 07/21/2007  . BURSITIS, HIP 09/30/2009  . Headache(784.0) 07/11/2008  . HYPERTENSION 01/05/2007  . PSORIASIS 01/05/2007  . TENDINITIS, ACHILLES 02/08/2007    History   Social History  . Marital Status: Married    Spouse Name: N/A    Number of Children: N/A  . Years of Education: N/A   Occupational History  . Not on file.   Social History Main Topics  . Smoking status: Never Smoker   . Smokeless tobacco: Never Used  . Alcohol Use: Yes  . Drug Use: No  . Sexually Active: Yes    Birth Control/ Protection: None   Other Topics Concern  . Not on file   Social History Narrative  . No narrative on file    Past Surgical History  Procedure Laterality Date  . Lipoma excision      No family history on file.  Allergies  Allergen Reactions  . Bismuth Subsalicylate Swelling  .  Penicillins     Current Outpatient Prescriptions on File Prior to Visit  Medication Sig Dispense Refill  . bisoprolol (ZEBETA) 10 MG tablet Take 10 mg by mouth daily.      . bisoprolol-hydrochlorothiazide (ZIAC) 5-6.25 MG per tablet TAKE 1 TABLET BY MOUTH EVERY DAY  30 tablet  11  . buPROPion (WELLBUTRIN XL) 300 MG 24 hr tablet TAKE 1 TABLET (300 MG TOTAL) BY MOUTH DAILY.  30 tablet  5  . cetirizine-pseudoephedrine (ZYRTEC-D) 5-120 MG per tablet Take 1 tablet by mouth 2 (two) times daily.      Marland Kitchen esomeprazole (NEXIUM) 40 MG capsule Take 1 capsule (40 mg total) by mouth daily.  30 capsule  3  . indomethacin (INDOCIN) 50 MG capsule Take 1 capsule (50 mg total) by mouth 3 (three) times daily with meals.  30 capsule  1  . mometasone (ELOCON) 0.1 % cream Apply topically daily.  45 g  1  . SUMAtriptan-naproxen (TREXIMET) 85-500 MG per tablet Take 1 tablet by mouth every 2 (two) hours as needed for migraine.  10 tablet  3  . valACYclovir (VALTREX) 500 MG tablet Take 1 tablet (500 mg total) by mouth daily.  30 tablet  11  . [DISCONTINUED] lisinopril-hydrochlorothiazide (PRINZIDE,ZESTORETIC) 20-25 MG per tablet Take 1 tablet by mouth daily.  90 tablet  3   No current facility-administered medications on file prior to visit.    BP 120/84  Pulse 72  Temp(Src) 98.2 F (36.8 C)  Resp 16  Ht 5\' 9"  (1.753 m)  Wt 186 lb (  84.369 kg)  BMI 27.45 kg/m2       Objective:   Physical Exam  Nursing note and vitals reviewed. Constitutional: He appears well-developed and well-nourished.  HENT:  Head: Normocephalic and atraumatic.  Eyes: Conjunctivae are normal. Pupils are equal, round, and reactive to light.  Neck: Normal range of motion. Neck supple.  Cardiovascular: Normal rate and regular rhythm.   Pulmonary/Chest: Effort normal and breath sounds normal.  Abdominal: Soft. Bowel sounds are normal.          Assessment & Plan:  mood and stress lower ADD and has been improved with  wellbutrin Stay on and lond term use discussed Head aches stable

## 2013-04-19 ENCOUNTER — Telehealth: Payer: Self-pay | Admitting: Internal Medicine

## 2013-04-19 ENCOUNTER — Other Ambulatory Visit: Payer: Self-pay | Admitting: *Deleted

## 2013-04-19 DIAGNOSIS — G44009 Cluster headache syndrome, unspecified, not intractable: Secondary | ICD-10-CM

## 2013-04-19 MED ORDER — TRAMADOL HCL 50 MG PO TABS: ORAL_TABLET | ORAL | Status: DC

## 2013-04-19 NOTE — Telephone Encounter (Signed)
Sent to [facility]

## 2013-04-19 NOTE — Telephone Encounter (Signed)
Patient was transferring his medication over to another pharmacy.He was able to transfer all medication except traMADol (ULTRAM) 50 MG tablet  and he is needing a refill.

## 2013-05-26 ENCOUNTER — Other Ambulatory Visit: Payer: Self-pay | Admitting: Internal Medicine

## 2013-06-09 ENCOUNTER — Other Ambulatory Visit (INDEPENDENT_AMBULATORY_CARE_PROVIDER_SITE_OTHER): Payer: 59

## 2013-06-09 DIAGNOSIS — Z0000 Encounter for general adult medical examination without abnormal findings: Secondary | ICD-10-CM

## 2013-06-09 LAB — CBC WITH DIFFERENTIAL/PLATELET
Basophils Absolute: 0.1 10*3/uL (ref 0.0–0.1)
Basophils Relative: 1.1 % (ref 0.0–3.0)
Eosinophils Absolute: 0.3 10*3/uL (ref 0.0–0.7)
Eosinophils Relative: 4.2 % (ref 0.0–5.0)
HCT: 45.5 % (ref 39.0–52.0)
Hemoglobin: 15.7 g/dL (ref 13.0–17.0)
Lymphocytes Relative: 28 % (ref 12.0–46.0)
Lymphs Abs: 2 10*3/uL (ref 0.7–4.0)
MCHC: 34.5 g/dL (ref 30.0–36.0)
MCV: 90.9 fl (ref 78.0–100.0)
Monocytes Absolute: 0.5 10*3/uL (ref 0.1–1.0)
Monocytes Relative: 7.3 % (ref 3.0–12.0)
Neutro Abs: 4.2 10*3/uL (ref 1.4–7.7)
Neutrophils Relative %: 59.4 % (ref 43.0–77.0)
Platelets: 332 10*3/uL (ref 150.0–400.0)
RBC: 5.01 Mil/uL (ref 4.22–5.81)
RDW: 12.7 % (ref 11.5–14.6)
WBC: 7.1 10*3/uL (ref 4.5–10.5)

## 2013-06-09 LAB — BASIC METABOLIC PANEL
BUN: 18 mg/dL (ref 6–23)
CO2: 26 mEq/L (ref 19–32)
Calcium: 9.5 mg/dL (ref 8.4–10.5)
Chloride: 107 mEq/L (ref 96–112)
Creatinine, Ser: 1.3 mg/dL (ref 0.4–1.5)
GFR: 63.8 mL/min (ref >60.00)
Glucose, Bld: 88 mg/dL (ref 70–99)
Potassium: 3.7 mEq/L (ref 3.5–5.1)
Sodium: 139 mEq/L (ref 135–145)

## 2013-06-09 LAB — HEPATIC FUNCTION PANEL
ALT: 33 U/L (ref 0–53)
AST: 25 U/L (ref 0–37)
Albumin: 4.3 g/dL (ref 3.5–5.2)
Alkaline Phosphatase: 46 U/L (ref 39–117)
Bilirubin, Direct: 0.1 mg/dL (ref 0.0–0.3)
Total Bilirubin: 0.9 mg/dL (ref 0.3–1.2)
Total Protein: 7.5 g/dL (ref 6.0–8.3)

## 2013-06-09 LAB — POCT URINALYSIS DIPSTICK
Bilirubin, UA: NEGATIVE
Glucose, UA: NEGATIVE
Ketones, UA: NEGATIVE
Leukocytes, UA: NEGATIVE
Nitrite, UA: NEGATIVE
Protein, UA: NEGATIVE
Spec Grav, UA: >=1.03
Urobilinogen, UA: 0.2
pH, UA: 5.5

## 2013-06-09 LAB — LIPID PANEL
Cholesterol: 239 mg/dL — ABNORMAL HIGH (ref 0–200)
HDL: 37.4 mg/dL — ABNORMAL LOW (ref >39.00)
Total CHOL/HDL Ratio: 6
Triglycerides: 283 mg/dL — ABNORMAL HIGH (ref 0.0–149.0)
VLDL: 56.6 mg/dL — ABNORMAL HIGH (ref 0.0–40.0)

## 2013-06-09 LAB — LDL CHOLESTEROL, DIRECT: Direct LDL: 137.9 mg/dL

## 2013-06-09 LAB — TSH: TSH: 1.25 u[IU]/mL (ref 0.35–5.50)

## 2013-06-15 ENCOUNTER — Ambulatory Visit: Payer: 59 | Admitting: Podiatry

## 2013-06-16 ENCOUNTER — Ambulatory Visit (INDEPENDENT_AMBULATORY_CARE_PROVIDER_SITE_OTHER): Payer: 59 | Admitting: Internal Medicine

## 2013-06-16 ENCOUNTER — Encounter: Payer: Self-pay | Admitting: Internal Medicine

## 2013-06-16 VITALS — BP 120/82 | HR 72 | Temp 98.1°F | Resp 16 | Ht 69.0 in | Wt 190.0 lb

## 2013-06-16 DIAGNOSIS — E785 Hyperlipidemia, unspecified: Secondary | ICD-10-CM

## 2013-06-16 DIAGNOSIS — I10 Essential (primary) hypertension: Secondary | ICD-10-CM

## 2013-06-16 DIAGNOSIS — Z0000 Encounter for general adult medical examination without abnormal findings: Secondary | ICD-10-CM

## 2013-06-16 MED ORDER — CIPROFLOXACIN HCL 500 MG PO TABS: 500.0000 mg | ORAL_TABLET | Freq: Two times a day (BID) | ORAL | Status: DC

## 2013-06-16 NOTE — Progress Notes (Signed)
Subjective:    Patient ID: Benjamin [name], male    DOB: [birth year], 43 y.o.   MRN: [id number]  HPI Patient is a 43 year old male who presents annual examination.  He has increased urinary frequency and a strong family history for hyperlipidemia cardiovascular disease.      Review of Systems  Constitutional: Negative for fever and fatigue.  HENT: Negative for congestion, hearing loss and postnasal drip.   Eyes: Negative for discharge, redness and visual disturbance.  Respiratory: Negative for cough, shortness of breath and wheezing.   Cardiovascular: Negative for leg swelling.  Gastrointestinal: Negative for abdominal pain, constipation and abdominal distention.  Genitourinary: Negative for urgency and frequency.  Musculoskeletal: Negative for arthralgias, joint swelling and neck pain.  Skin: Negative for color change and rash.  Neurological: Negative for weakness and light-headedness.  Hematological: Negative for adenopathy.  Psychiatric/Behavioral: Negative for behavioral problems.   Past Medical History  Diagnosis Date  . ALLERGIC RHINITIS 07/11/2008  . BACK PAIN, CHRONIC, INTERMITTENT 10/17/2008  . BENIGN PROSTATIC HYPERTROPHY, WITH URINARY OBSTRUCTION 07/21/2007  . BURSITIS, HIP 09/30/2009  . Headache(784.0) 07/11/2008  . HYPERTENSION 01/05/2007  . PSORIASIS 01/05/2007  . TENDINITIS, ACHILLES 02/08/2007    History   Social History  . Marital Status: Married    Spouse Name: N/A    Number of Children: N/A  . Years of Education: N/A   Occupational History  . Not on file.   Social History Main Topics  . Smoking status: Never Smoker   . Smokeless tobacco: Never Used  . Alcohol Use: Yes  . Drug Use: No  . Sexual Activity: Yes    Birth Control/ Protection: None   Other Topics Concern  . Not on file   Social History Narrative  . No narrative on file    Past Surgical History  Procedure Laterality Date  . Lipoma excision      No family history on  file.  Allergies  Allergen Reactions  . Bismuth Subsalicylate Swelling  . Penicillins     Current Outpatient Prescriptions on File Prior to Visit  Medication Sig Dispense Refill  . bisoprolol (ZEBETA) 10 MG tablet Take 10 mg by mouth daily.      Marland Kitchen buPROPion (WELLBUTRIN XL) 300 MG 24 hr tablet TAKE ONE TABLET BY MOUTH ONE TIME DAILY   30 tablet  0  . cetirizine-pseudoephedrine (ZYRTEC-D) 5-120 MG per tablet Take 1 tablet by mouth 2 (two) times daily.      Marland Kitchen esomeprazole (NEXIUM) 40 MG capsule Take 1 capsule (40 mg total) by mouth daily.  30 capsule  3  . SUMAtriptan-naproxen (TREXIMET) 85-500 MG per tablet Take 1 tablet by mouth every 2 (two) hours as needed for migraine.  10 tablet  3  . traMADol (ULTRAM) 50 MG tablet TAKE 1 TABLET EVERY 6 HOURS AS NEEDED FOR PAIN (MAX 8 PER DAY)  50 tablet  3  . valACYclovir (VALTREX) 500 MG tablet Take 1 tablet (500 mg total) by mouth daily.  30 tablet  11  . [DISCONTINUED] lisinopril-hydrochlorothiazide (PRINZIDE,ZESTORETIC) 20-25 MG per tablet Take 1 tablet by mouth daily.  90 tablet  3   No current facility-administered medications on file prior to visit.    BP 120/82  Pulse 72  Temp(Src) 98.1 F (36.7 C)  Resp 16  Ht 5\' 9"  (1.753 m)  Wt 190 lb (86.183 kg)  BMI 28.05 kg/m2       Objective:   Physical Exam  Nursing note and vitals reviewed. Constitutional:  He is oriented to person, place, and time. He appears well-developed and well-nourished.  HENT:  Head: Normocephalic and atraumatic.  Eyes: Conjunctivae are normal. Pupils are equal, round, and reactive to light.  Neck: Normal range of motion. Neck supple.  Cardiovascular: Normal rate and regular rhythm.   Pulmonary/Chest: Effort normal and breath sounds normal.  Abdominal: Soft. Bowel sounds are normal.  Genitourinary: Rectum normal and prostate normal.  Musculoskeletal: Normal range of motion.  Neurological: He is alert and oriented to person, place, and time.  Skin: Skin is  warm.  Psychiatric: He has a normal mood and affect. His behavior is normal.   Boggy inflamed prostate       Assessment & Plan:   Patient presents for yearly preventative medicine examination.   all immunizations and health maintenance protocols were reviewed with the patient and they are up to date with these protocols.   screening laboratory values were reviewed with the patient including screening of hyperlipidemia PSA renal function and hepatic function.   There medications past medical history social history problem list and allergies were reviewed in detail.   Goals were established with regard to weight loss exercise diet in compliance with medications   Lipid intervention is warranted we will start with pulse Crestor 20 mg once a week  Acute prostatitis treat with Cipro 500 twice a day two-week

## 2013-06-16 NOTE — Patient Instructions (Signed)
The patient is instructed to continue all medications as prescribed. Schedule followup with check out clerk upon leaving the clinic  

## 2013-06-16 NOTE — Progress Notes (Signed)
Pre visit review using our clinic review tool, if applicable. No additional management support is needed unless otherwise documented below in the visit note. 

## 2013-06-17 ENCOUNTER — Telehealth: Payer: Self-pay | Admitting: Internal Medicine

## 2013-06-17 NOTE — Telephone Encounter (Signed)
Relevant patient education assigned to patient using Emmi. ° °

## 2013-06-27 ENCOUNTER — Ambulatory Visit (INDEPENDENT_AMBULATORY_CARE_PROVIDER_SITE_OTHER): Payer: 59 | Admitting: Podiatry

## 2013-06-27 ENCOUNTER — Encounter: Payer: Self-pay | Admitting: Podiatry

## 2013-06-27 VITALS — BP 124/86 | HR 77 | Resp 16 | Ht 69.0 in | Wt 185.0 lb

## 2013-06-27 DIAGNOSIS — B351 Tinea unguium: Secondary | ICD-10-CM

## 2013-06-27 MED ORDER — TERBINAFINE HCL 250 MG PO TABS
250.0000 mg | ORAL_TABLET | Freq: Every day | ORAL | Status: DC
Start: 2013-06-27 — End: 2013-07-25

## 2013-06-27 NOTE — Progress Notes (Signed)
   Subjective:    Patient ID: Benjamin [name], male    DOB: November 01, 1970, 43 y.o.   MRN: [id number]  HPI Comments: Fungus toenails, had it before but it cleared up and now it is back and it occasionally hurts. Both great toenails.  treatment in the past with [facility] , used lamisil     Review of Systems  Skin:       Change in nails  All other systems reviewed and are negative.       Objective:   Physical Exam: I have reviewed his past medical history medications allergies surgeries and social history. States that he is taken Lamisil in the past. He states that his toenails however have been reinfected and the fungus is coming back. Vital signs are stable he is alert and oriented x3. Pulses are strongly palpable bilateral. Deep tendon reflexes are intact bilateral. Muscle strength is 5 over 5 dorsiflexors plantar flexors inverters everters all intrinsic musculature is intact. Orthopedic evaluation demonstrates all joints distal to the ankle a full range of motion without crepitation. Cutaneous evaluation does demonstrate what appears to be onychomycosis to the distal aspect of the nails bilateral.        Assessment & Plan:  Assessment: Dermatophytosis of the nail plate bilateral.  Plan: Having recently had blood work for liver profile and CBC, I reviewed this today AST and ALT appear to be normal. I started him on Lamisil 250 mg 1 by mouth daily and I will followup with him in one month for another set of blood work.

## 2013-06-27 NOTE — Progress Notes (Deleted)
Both great toenails discolored and occasionally hurt , had a fungus before but it cleared up .

## 2013-07-05 ENCOUNTER — Other Ambulatory Visit: Payer: Self-pay | Admitting: Internal Medicine

## 2013-07-17 ENCOUNTER — Encounter: Payer: Self-pay | Admitting: Internal Medicine

## 2013-07-25 ENCOUNTER — Encounter: Payer: Self-pay | Admitting: Podiatry

## 2013-07-25 ENCOUNTER — Ambulatory Visit (INDEPENDENT_AMBULATORY_CARE_PROVIDER_SITE_OTHER): Payer: 59 | Admitting: Podiatry

## 2013-07-25 VITALS — BP 138/88 | HR 85 | Resp 16

## 2013-07-25 DIAGNOSIS — Z79899 Other long term (current) drug therapy: Secondary | ICD-10-CM

## 2013-07-25 DIAGNOSIS — B351 Tinea unguium: Secondary | ICD-10-CM

## 2013-07-25 MED ORDER — TERBINAFINE HCL 250 MG PO TABS: 250.0000 mg | ORAL_TABLET | Freq: Every day | ORAL | Status: DC

## 2013-07-25 NOTE — Progress Notes (Signed)
He presents today one month after taking Lamisil. He denies fever chills nausea vomiting muscle aches and pains states it is doing quite well without changes to the nail plate as of yet.  Objective: Vital signs are stable he is alert and oriented x3. No obvious rashes and no changes of the nail plates as of yet.  Assessment: Onychomycosis long-term drug therapy associated with Lamisil.  Plan: Send him out for another liver profile today and another prescription for Lamisil 250 mg, one tablet daily #90.

## 2013-09-27 ENCOUNTER — Other Ambulatory Visit: Payer: Self-pay | Admitting: Internal Medicine

## 2013-11-21 ENCOUNTER — Ambulatory Visit: Payer: 59 | Admitting: Podiatry

## 2013-12-12 ENCOUNTER — Other Ambulatory Visit (INDEPENDENT_AMBULATORY_CARE_PROVIDER_SITE_OTHER): Payer: 59

## 2013-12-12 DIAGNOSIS — E785 Hyperlipidemia, unspecified: Secondary | ICD-10-CM

## 2013-12-12 LAB — LIPID PANEL
Cholesterol: 197 mg/dL (ref 0–200)
HDL: 42.8 mg/dL (ref >39.00)
LDL Cholesterol: 117 mg/dL — ABNORMAL HIGH (ref 0–99)
NonHDL: 154.2
Total CHOL/HDL Ratio: 5
Triglycerides: 185 mg/dL — ABNORMAL HIGH (ref 0.0–149.0)
VLDL: 37 mg/dL (ref 0.0–40.0)

## 2013-12-12 LAB — HEPATIC FUNCTION PANEL
ALT: 24 U/L (ref 0–53)
AST: 23 U/L (ref 0–37)
Albumin: 4.4 g/dL (ref 3.5–5.2)
Alkaline Phosphatase: 49 U/L (ref 39–117)
Bilirubin, Direct: 0.1 mg/dL (ref 0.0–0.3)
Total Bilirubin: 0.9 mg/dL (ref 0.2–1.2)
Total Protein: 7.6 g/dL (ref 6.0–8.3)

## 2013-12-18 ENCOUNTER — Ambulatory Visit (INDEPENDENT_AMBULATORY_CARE_PROVIDER_SITE_OTHER): Payer: 59 | Admitting: Internal Medicine

## 2013-12-18 ENCOUNTER — Encounter: Payer: Self-pay | Admitting: Internal Medicine

## 2013-12-18 VITALS — BP 118/80 | HR 85 | Temp 98.5°F | Ht 69.0 in | Wt 189.0 lb

## 2013-12-18 DIAGNOSIS — B354 Tinea corporis: Secondary | ICD-10-CM

## 2013-12-18 DIAGNOSIS — G43101 Migraine with aura, not intractable, with status migrainosus: Secondary | ICD-10-CM

## 2013-12-18 DIAGNOSIS — K2100 Gastro-esophageal reflux disease with esophagitis, without bleeding: Secondary | ICD-10-CM

## 2013-12-18 DIAGNOSIS — I10 Essential (primary) hypertension: Secondary | ICD-10-CM

## 2013-12-18 DIAGNOSIS — G43111 Migraine with aura, intractable, with status migrainosus: Secondary | ICD-10-CM

## 2013-12-18 DIAGNOSIS — E785 Hyperlipidemia, unspecified: Secondary | ICD-10-CM

## 2013-12-18 MED ORDER — DEXLANSOPRAZOLE 60 MG PO CPDR: 60.0000 mg | DELAYED_RELEASE_CAPSULE | Freq: Every day | ORAL | Status: DC

## 2013-12-18 MED ORDER — ROSUVASTATIN CALCIUM 40 MG PO TABS
40.0000 mg | ORAL_TABLET | Freq: Every day | ORAL | Status: DC
Start: 2013-12-18 — End: 2015-02-05

## 2013-12-18 MED ORDER — CLOTRIMAZOLE-BETAMETHASONE 1-0.05 % EX CREA: 1.0000 "application " | TOPICAL_CREAM | Freq: Two times a day (BID) | CUTANEOUS | Status: DC

## 2013-12-18 NOTE — Progress Notes (Signed)
Subjective:    Patient ID: Benjamin [name], male    DOB: September 22, 1970, 43 y.o.   MRN: [id number]  HPI Multifactorial migrainse with stable patter Great BP control and on BB for prophylaxis Lipid monitoring today  Good result for the use of pulse crestor on a week Has ring worm on the chest wall   Review of Systems     Past Medical History  Diagnosis Date  . ALLERGIC RHINITIS 07/11/2008  . BACK PAIN, CHRONIC, INTERMITTENT 10/17/2008  . BENIGN PROSTATIC HYPERTROPHY, WITH URINARY OBSTRUCTION 07/21/2007  . BURSITIS, HIP 09/30/2009  . Headache(784.0) 07/11/2008  . HYPERTENSION 01/05/2007  . PSORIASIS 01/05/2007  . TENDINITIS, ACHILLES 02/08/2007    History   Social History  . Marital Status: Married    Spouse Name: N/A    Number of Children: N/A  . Years of Education: N/A   Occupational History  . Not on file.   Social History Main Topics  . Smoking status: Never Smoker   . Smokeless tobacco: Never Used  . Alcohol Use: Yes  . Drug Use: No  . Sexual Activity: Yes    Birth Control/ Protection: None   Other Topics Concern  . Not on file   Social History Narrative  . No narrative on file    Past Surgical History  Procedure Laterality Date  . Lipoma excision      History reviewed. No pertinent family history.  Allergies  Allergen Reactions  . Bismuth Subsalicylate Swelling  . Penicillins     Current Outpatient Prescriptions on File Prior to Visit  Medication Sig Dispense Refill  . bisoprolol (ZEBETA) 10 MG tablet Take 10 mg by mouth daily.      . bisoprolol-hydrochlorothiazide (ZIAC) 5-6.25 MG per tablet TAKE ONE TABLET BY MOUTH ONE TIME DAILY   30 tablet  5  . buPROPion (WELLBUTRIN XL) 300 MG 24 hr tablet TAKE ONE TABLET BY MOUTH ONE TIME DAILY   30 tablet  6  . cetirizine-pseudoephedrine (ZYRTEC-D) 5-120 MG per tablet Take 1 tablet by mouth 2 (two) times daily.      Marland Kitchen esomeprazole (NEXIUM) 40 MG capsule Take 1 capsule (40 mg total) by mouth daily.  30 capsule  3    . rosuvastatin (CRESTOR) 40 MG tablet Take 40 mg by mouth daily.      . SUMAtriptan-naproxen (TREXIMET) 85-500 MG per tablet Take 1 tablet by mouth every 2 (two) hours as needed for migraine.  10 tablet  3  . terbinafine (LAMISIL) 250 MG tablet Take 1 tablet (250 mg total) by mouth daily.  90 tablet  0  . traMADol (ULTRAM) 50 MG tablet TAKE 1 TABLET EVERY 6 HOURS AS NEEDED FOR PAIN (MAX 8 PER DAY)  50 tablet  3  . valACYclovir (VALTREX) 500 MG tablet TAKE ONE TABLET BY MOUTH ONE TIME DAILY   30 tablet  5  . [DISCONTINUED] lisinopril-hydrochlorothiazide (PRINZIDE,ZESTORETIC) 20-25 MG per tablet Take 1 tablet by mouth daily.  90 tablet  3   No current facility-administered medications on file prior to visit.    BP 118/80  Pulse 85  Temp(Src) 98.5 F (36.9 C) (Oral)  Ht 5\' 9"  (1.753 m)  Wt 189 lb (85.73 kg)  BMI 27.90 kg/m2  SpO2 97%    Objective:   Physical Exam  Constitutional: He appears well-developed and well-nourished.  HENT:  Head: Normocephalic and atraumatic.  Eyes: Conjunctivae are normal. Pupils are equal, round, and reactive to light.  Neck: Normal range of motion. Neck  supple.  Cardiovascular: Normal rate and regular rhythm.   Pulmonary/Chest: Effort normal and breath sounds normal.  Abdominal: Soft. Bowel sounds are normal.  Skin: Skin is warm and dry. Rash noted.  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

## 2013-12-18 NOTE — Patient Instructions (Signed)
The patient is instructed to continue all medications as prescribed. Schedule followup with check out clerk upon leaving the clinic  

## 2013-12-18 NOTE — Progress Notes (Signed)
Pre visit review using our clinic review tool, if applicable. No additional management support is needed unless otherwise documented below in the visit note. 

## 2014-02-19 ENCOUNTER — Ambulatory Visit (INDEPENDENT_AMBULATORY_CARE_PROVIDER_SITE_OTHER): Payer: 59 | Admitting: Family Medicine

## 2014-02-19 ENCOUNTER — Encounter: Payer: Self-pay | Admitting: Family Medicine

## 2014-02-19 VITALS — BP 127/80 | HR 70 | Temp 98.0°F | Wt 185.4 lb

## 2014-02-19 DIAGNOSIS — K219 Gastro-esophageal reflux disease without esophagitis: Secondary | ICD-10-CM

## 2014-02-19 DIAGNOSIS — F4323 Adjustment disorder with mixed anxiety and depressed mood: Secondary | ICD-10-CM | POA: Insufficient documentation

## 2014-02-19 DIAGNOSIS — G44029 Chronic cluster headache, not intractable: Secondary | ICD-10-CM

## 2014-02-19 DIAGNOSIS — Z23 Encounter for immunization: Secondary | ICD-10-CM

## 2014-02-19 DIAGNOSIS — R197 Diarrhea, unspecified: Secondary | ICD-10-CM

## 2014-02-19 DIAGNOSIS — J069 Acute upper respiratory infection, unspecified: Secondary | ICD-10-CM

## 2014-02-19 MED ORDER — TRAMADOL HCL 50 MG PO TABS: ORAL_TABLET | ORAL | Status: DC

## 2014-02-19 NOTE — Progress Notes (Signed)
Pre visit review using our clinic review tool, if applicable. No additional management support is needed unless otherwise documented below in the visit note. 

## 2014-02-19 NOTE — Patient Instructions (Signed)
Psychologist: Dorian Pod [phone].  Contact her for appointment.

## 2014-02-19 NOTE — Progress Notes (Signed)
Office Note 02/19/2014  CC: transfer care, GI and congestion issues  HPI:  Benjamin [name] is a 43 y.o. White male who is here to transfer care. Patient's most recent primary MD: Dr. Susette Racer from clinical practice. Old records in EPIC/[facility] EMR were reviewed prior to or during today's visit.  Onset about 5 d/a, lots of nasal congestion/PND/ST, headaches that he describes as "chronic".  Some light sensitivity. at times.  No fevers.  No cough.  No face pain or upper teeth pain. Takes zyrtec D chronically.  Lately taking 400 mg ibuprofen prn HA.  Was taking tramadol as needed for chronic headache: says he typically took 2 per week.  He had been rx'd a triptan or two at one time and it did not help.    Very stressed out and mood is depressed lately with separation from wife, asks for referral to counselor today.  No SI or HI.  GI issues: about 1 wk ago started having 3-5 watery BMs with off/on cramps, nausea w/out vomiting.   Went to UC and an x-ray abd was done, some blood tests done (all normal per pt).  No meds rx'd, BRAT diet recommended.  Stools getting a bit firmer and less frequent, nausea lessening a lot, cramping almost completely gone. Diet is back to normal. Has bad GERD, worsening the last year, has to take his PPI qd.  He has remote hx of EGD, not in EMR, pt doesn't recall any abnormality except for a "polyp" that was removed and determined benign, no Barrett's esophagus. Has been on acid suppression daily since that time.     Past Medical History  Diagnosis Date  . ALLERGIC RHINITIS 07/11/2008  . BACK PAIN, CHRONIC, INTERMITTENT 10/17/2008  . BENIGN PROSTATIC HYPERTROPHY, WITH URINARY OBSTRUCTION 07/21/2007  . BURSITIS, HIP 09/30/2009  . HYPERTENSION 01/05/2007  . PSORIASIS 01/05/2007  . Herpes labialis   . Hyperlipidemia     Past Surgical History  Procedure Laterality Date  . Lipoma excision      several    Family History  Problem Relation Age of Onset  .  Colon cancer      neg hx  . Prostate cancer      neg hx  . CAD Father     around age 7  . CAD Paternal Grandmother     History   Social History  . Marital Status: Married    Spouse Name: N/A    Number of Children: N/A  . Years of Education: N/A   Occupational History  . Not on file.   Social History Main Topics  . Smoking status: Never Smoker   . Smokeless tobacco: Never Used  . Alcohol Use: Yes  . Drug Use: No  . Sexual Activity: Yes    Birth Control/ Protection: None   Other Topics Concern  . Not on file   Social History Narrative   Currently separated as of 01/2014, 2 children.  One younger brother.   Occupation: Press photographer for [employer].   No tobacco.   Occ alcohol (1-2 x/month).  No hx of alc/drug prob.   Exercise: runs about 2 times a week.   Diet: regular american diet.            MEDS; he actually takes his crestor only once a week Outpatient Encounter Prescriptions as of 02/19/2014  Medication Sig  . bisoprolol-hydrochlorothiazide (ZIAC) 5-6.25 MG per tablet TAKE ONE TABLET BY MOUTH ONE TIME DAILY   . buPROPion (WELLBUTRIN XL)  300 MG 24 hr tablet TAKE ONE TABLET BY MOUTH ONE TIME DAILY   . cetirizine-pseudoephedrine (ZYRTEC-D) 5-120 MG per tablet Take 1 tablet by mouth 2 (two) times daily.  Marland Kitchen dexlansoprazole (DEXILANT) 60 MG capsule Take 1 capsule (60 mg total) by mouth daily.  . rosuvastatin (CRESTOR) 40 MG tablet Take 1 tablet (40 mg total) by mouth daily.  . traMADol (ULTRAM) 50 MG tablet 1-2 tabs po q6h prn severe headache  . valACYclovir (VALTREX) 500 MG tablet TAKE ONE TABLET BY MOUTH ONE TIME DAILY   . [DISCONTINUED] traMADol (ULTRAM) 50 MG tablet TAKE 1 TABLET EVERY 6 HOURS AS NEEDED FOR PAIN (MAX 8 PER DAY)  . [DISCONTINUED] bisoprolol (ZEBETA) 10 MG tablet Take 10 mg by mouth daily.  . [DISCONTINUED] clotrimazole-betamethasone (LOTRISONE) cream Apply 1 application topically 2 (two) times daily.  . [DISCONTINUED] SUMAtriptan-naproxen  (TREXIMET) 85-500 MG per tablet Take 1 tablet by mouth every 2 (two) hours as needed for migraine.  . [DISCONTINUED] terbinafine (LAMISIL) 250 MG tablet Take 1 tablet (250 mg total) by mouth daily.    Allergies  Allergen Reactions  . Bismuth Subsalicylate Swelling  . Penicillins     ROS Review of Systems  Constitutional: Negative for fever and weight loss.  HENT: Negative for ear discharge and hearing loss.   Eyes: Negative for redness.  Respiratory: Negative for cough and shortness of breath.   Cardiovascular: Negative for chest pain and palpitations.  Gastrointestinal: Negative for vomiting, blood in stool and melena.  Musculoskeletal: Negative for joint pain.  Skin: Negative for rash.  Neurological: Positive for headaches. Negative for dizziness and weakness.  Psychiatric/Behavioral: Positive for depression (see HPI). Negative for substance abuse. The patient is nervous/anxious.     PE; Blood pressure 127/80, pulse 70, temperature 98 F (36.7 C), temperature source Oral, weight 185 lb 6.4 oz (84.097 kg), SpO2 100.00%. VS: noted--normal. Gen: alert, NAD, NONTOXIC APPEARING. HEENT: eyes without injection, drainage, or swelling.  Ears: EACs clear, TMs with normal light reflex and landmarks.  Nose: Clear rhinorrhea, with some dried, crusty exudate adherent to mildly injected mucosa.  No purulent d/c.  No paranasal sinus TTP.  No facial swelling.  Throat and mouth without focal lesion.  No pharyngial swelling, erythema, or exudate.   Neck: supple, no LAD.   LUNGS: CTA bilat, nonlabored resps.   CV: RRR, no m/r/g. EXT: no c/c/e SKIN: no rash  Pertinent labs:  None today  ASSESSMENT AND PLAN:   Transfer pt:  1) Viral URI; symptomatic care discussed.  Continue zyrtec D and add saline nasal spray prn.  2) Resolving acute GE: reassured.  3) Chronic tension headaches: occasionally one of these is severe enough that he has used tramadol successfully as abortive med and I'm ok  with continuing this approach as long as his use is no more than 1-2 times per week.  Tramadol 50mg , 1-2 q6h prn, #30, RF x 1 given today.  4) Chronic GERD, requiring daily PPI x years. Remote hx of EGD.  Will refer back to GI for ongoing f/u and consideration of surveillance for Barrett's esophagus.  5) Adjustment d/o with anx/dep: secondary to separation from his wife.  Gave pt contact info for psychologist, Doroteo Glassman, today.  6) Prev health care: Flu vaccine IM today.  An After Visit Summary was printed and given to the patient.  Return in about 6 months (around 08/20/2014) for routine chronic illness f/u.

## 2014-02-27 ENCOUNTER — Encounter: Payer: Self-pay | Admitting: Gastroenterology

## 2014-03-05 ENCOUNTER — Ambulatory Visit (INDEPENDENT_AMBULATORY_CARE_PROVIDER_SITE_OTHER): Payer: 59 | Admitting: Physician Assistant

## 2014-03-05 VITALS — BP 112/68 | HR 74 | Temp 98.0°F | Resp 17 | Ht 69.0 in | Wt 181.0 lb

## 2014-03-05 DIAGNOSIS — R509 Fever, unspecified: Secondary | ICD-10-CM

## 2014-03-05 DIAGNOSIS — J069 Acute upper respiratory infection, unspecified: Secondary | ICD-10-CM

## 2014-03-05 DIAGNOSIS — R05 Cough: Secondary | ICD-10-CM

## 2014-03-05 DIAGNOSIS — R059 Cough, unspecified: Secondary | ICD-10-CM

## 2014-03-05 LAB — POCT INFLUENZA A/B
Influenza A, POC: NEGATIVE
Influenza B, POC: NEGATIVE

## 2014-03-05 MED ORDER — MUCINEX DM MAXIMUM STRENGTH 60-1200 MG PO TB12: 1.0000 | ORAL_TABLET | Freq: Two times a day (BID) | ORAL | Status: DC

## 2014-03-05 MED ORDER — BENZONATATE 100 MG PO CAPS: 100.0000 mg | ORAL_CAPSULE | Freq: Three times a day (TID) | ORAL | Status: DC | PRN

## 2014-03-05 MED ORDER — HYDROCOD POLST-CHLORPHEN POLST 10-8 MG/5ML PO LQCR: 5.0000 mL | Freq: Two times a day (BID) | ORAL | Status: DC | PRN

## 2014-03-05 NOTE — Progress Notes (Signed)
I have examined this patient along with Ms. Brewington, PA-C and agree.  

## 2014-03-05 NOTE — Patient Instructions (Signed)
Upper Respiratory Infection, Adult An upper respiratory infection (URI) is also known as the common cold. It is often caused by a type of germ (virus). Colds are easily spread (contagious). You can pass it to others by kissing, coughing, sneezing, or drinking out of the same glass. Usually, you get better in 1 or 2 weeks.  HOME CARE   Only take medicine as told by your doctor.  Use a warm mist humidifier or breathe in steam from a hot shower.  Drink enough water and fluids to keep your pee (urine) clear or pale yellow.  Get plenty of rest.  Return to work when your temperature is back to normal or as told by your doctor. You may use a face mask and wash your hands to stop your cold from spreading. GET HELP RIGHT AWAY IF:   After the first few days, you feel you are getting worse.  You have questions about your medicine.  You have chills, shortness of breath, or brown or red spit (mucus).  You have yellow or brown snot (nasal discharge) or pain in the face, especially when you bend forward.  You have a fever, puffy (swollen) neck, pain when you swallow, or white spots in the back of your throat.  You have a bad headache, ear pain, sinus pain, or chest pain.  You have a high-pitched whistling sound when you breathe in and out (wheezing).  You have a lasting cough or cough up blood.  You have sore muscles or a stiff neck. MAKE SURE YOU:   Understand these instructions.  Will watch your condition.  Will get help right away if you are not doing well or get worse. Document Released: 11/04/2007 Document Revised: 08/10/2011 Document Reviewed: 08/23/2013 [employer] Patient Information 2015 [employer]. This information is not intended to replace advice given to you by your health care provider. Make sure you discuss any questions you have with your health care provider.  

## 2014-03-05 NOTE — Progress Notes (Signed)
Subjective:    Patient ID: Benjamin [name], male    DOB: Jul 17, 1970, 43 y.o.   MRN: [id number]  HPI Patient presents to clinic for worsening sore throat and cough. Reports fever of 100 degrees F on Friday (03/02/14) that resolved the following day, however, sore throat, cough,chills, headache, and nausea have persisted. Cough has been keeping up at night and he has not been eating much for the past two days. Denies PMH of asthma and allergies well controlled with zyrtec. No sick contacts. Has tried DayQuil without much relief. Had flu vaccine two weeks ago.   Review of Systems  Constitutional: Positive for fever, chills, activity change (decreased, mostly laying down), appetite change (decreased) and fatigue.  HENT: Positive for congestion, postnasal drip, rhinorrhea, sinus pressure, sore throat and trouble swallowing. Negative for ear discharge, ear pain, nosebleeds, sneezing and tinnitus.   Eyes: Positive for discharge (watery). Negative for pain, redness and itching.  Respiratory: Positive for cough (nonproductive). Negative for chest tightness, shortness of breath and wheezing.   Cardiovascular: Negative for chest pain and palpitations.  Gastrointestinal: Positive for nausea, abdominal pain and diarrhea. Negative for vomiting and constipation.  Genitourinary: Negative.   Musculoskeletal: Positive for arthralgias and myalgias. Negative for neck pain and neck stiffness.  Allergic/Immunologic: Positive for environmental allergies. Negative for food allergies.  Neurological: Positive for dizziness, weakness and headaches. Negative for syncope and light-headedness.  Hematological: Negative for adenopathy.       Objective:   Physical Exam  Constitutional: He is oriented to person, place, and time. He appears well-developed and well-nourished. No distress.  HENT:  Head: Normocephalic and atraumatic.  Right Ear: Hearing, external ear and ear canal normal. No drainage, swelling or  tenderness. Tympanic membrane is not injected, not perforated, not erythematous, not retracted and not bulging. A middle ear effusion (serous) is present. No decreased hearing is noted.  Left Ear: Hearing, tympanic membrane, external ear and ear canal normal. No drainage, swelling or tenderness. Tympanic membrane is not injected, not perforated, not erythematous, not retracted and not bulging.  No middle ear effusion. No decreased hearing is noted.  Nose: Mucosal edema and rhinorrhea present. No sinus tenderness. Right sinus exhibits no maxillary sinus tenderness and no frontal sinus tenderness. Left sinus exhibits no maxillary sinus tenderness and no frontal sinus tenderness.  Mouth/Throat: Mucous membranes are not pale and not dry. Posterior oropharyngeal erythema present. No oropharyngeal exudate or posterior oropharyngeal edema.  Eyes: Conjunctivae and EOM are normal. Pupils are equal, round, and reactive to light. Right eye exhibits no discharge. Left eye exhibits no discharge.  Neck: Normal range of motion. Neck supple.  Cardiovascular: Normal rate, regular rhythm and normal heart sounds.  Exam reveals no gallop and no friction rub.   No murmur heard. Pulmonary/Chest: Effort normal and breath sounds normal. He has no wheezes. He has no rales.  Abdominal: Soft. Bowel sounds are normal. He exhibits no distension and no mass. There is no tenderness. There is no rebound.  Lymphadenopathy:    He has no cervical adenopathy.  Neurological: He is alert and oriented to person, place, and time.  Skin: Skin is warm and dry. No rash noted. He is not diaphoretic. No erythema. No pallor.   Blood pressure 112/68, pulse 74, temperature 98 F (36.7 C), temperature source Oral, resp. rate 17, height 5\' 9"  (1.753 m), weight 181 lb (82.101 kg), SpO2 99.00%.  Results for orders placed in visit on 03/05/14  POCT INFLUENZA A/B  Result Value Ref Range   Influenza A, POC Negative     Influenza B, POC Negative          Assessment & Plan:  1. Fever and chills 2. Acute upper respiratory infection 3. Cough - POCT Influenza A/B - benzonatate (TESSALON) 100 MG capsule; Take 1-2 capsules (100-200 mg total) by mouth 3 (three) times daily as needed for cough.  Dispense: 40 capsule; Refill: 0 - Dextromethorphan-Guaifenesin (MUCINEX DM MAXIMUM STRENGTH) 60-1200 MG TB12; Take 1 tablet by mouth every 12 (twelve) hours.  Dispense: 20 each; Refill: 1 - chlorpheniramine-HYDROcodone (TUSSIONEX PENNKINETIC ER) 10-8 MG/5ML LQCR; Take 5 mLs by mouth every 12 (twelve) hours as needed for cough (cough).  Dispense: 100 mL; Refill: 0 - Encouraged to drink plenty of water and get plenty of rest. - Note for work written to return on 03/07/14.   Alveta Heimlich PA-C  [facility] 03/05/2014 10:51 AM

## 2014-03-14 ENCOUNTER — Other Ambulatory Visit: Payer: Self-pay | Admitting: Family Medicine

## 2014-03-14 MED ORDER — BUPROPION HCL ER (XL) 300 MG PO TB24: ORAL_TABLET | ORAL | Status: DC

## 2014-03-21 ENCOUNTER — Ambulatory Visit (INDEPENDENT_AMBULATORY_CARE_PROVIDER_SITE_OTHER): Payer: 59 | Admitting: Gastroenterology

## 2014-03-21 ENCOUNTER — Encounter: Payer: Self-pay | Admitting: Gastroenterology

## 2014-03-21 VITALS — BP 110/90 | HR 68 | Ht 68.5 in | Wt 183.4 lb

## 2014-03-21 DIAGNOSIS — K219 Gastro-esophageal reflux disease without esophagitis: Secondary | ICD-10-CM

## 2014-03-21 MED ORDER — RABEPRAZOLE SODIUM 20 MG PO TBEC: 20.0000 mg | DELAYED_RELEASE_TABLET | Freq: Every day | ORAL | Status: DC

## 2014-03-21 NOTE — Patient Instructions (Signed)
You have been scheduled for an endoscopy. Please follow written instructions given to you at your visit today. If you use inhalers (even only as needed), please bring them with you on the day of your procedure. Your physician has requested that you go to www.startemmi.com and enter the access code given to you at your visit today. This web site gives a general overview about your procedure. However, you should still follow specific instructions given to you by our office regarding your preparation for the procedure.   We have sent the following medications to your pharmacy for you to pick up at your convenience: Aciphex 20 mg daily (in place of Dexilant)  You will be due for a recall colonoscopy in 07/2019. We will send you a reminder in the mail when it gets closer to that time.  Food Choices for Gastroesophageal Reflux Disease When you have gastroesophageal reflux disease (GERD), the foods you eat and your eating habits are very important. Choosing the right foods can help ease the discomfort of GERD. WHAT GENERAL GUIDELINES DO I NEED TO FOLLOW?  Choose fruits, vegetables, whole grains, low-fat dairy products, and low-fat meat, fish, and poultry.  Limit fats such as oils, salad dressings, butter, nuts, and avocado.  Keep a food diary to identify foods that cause symptoms.  Avoid foods that cause reflux. These may be different for different people.  Eat frequent small meals instead of three large meals each day.  Eat your meals slowly, in a relaxed setting.  Limit fried foods.  Cook foods using methods other than frying.  Avoid drinking alcohol.  Avoid drinking large amounts of liquids with your meals.  Avoid bending over or lying down until 2-3 hours after eating. WHAT FOODS ARE NOT RECOMMENDED? The following are some foods and drinks that may worsen your symptoms: Vegetables Tomatoes. Tomato juice. Tomato and spaghetti sauce. Chili peppers. Onion and garlic.  Horseradish. Fruits Oranges, grapefruit, and lemon (fruit and juice). Meats High-fat meats, fish, and poultry. This includes hot dogs, ribs, ham, sausage, salami, and bacon. Dairy Whole milk and chocolate milk. Sour cream. Cream. Butter. Ice cream. Cream cheese.  Beverages Coffee and tea, with or without caffeine. Carbonated beverages or energy drinks. Condiments Hot sauce. Barbecue sauce.  Sweets/Desserts Chocolate and cocoa. Donuts. Peppermint and spearmint. Fats and Oils High-fat foods, including [place] fries and potato chips. Other Vinegar. Strong spices, such as black pepper, white pepper, red pepper, cayenne, curry powder, cloves, ginger, and chili powder. The items listed above may not be a complete list of foods and beverages to avoid. Contact your dietitian for more information. Document Released: 05/18/2005 Document Revised: 05/23/2013 Document Reviewed: 03/22/2013 [facility] Patient Information 2015 [place], [place]. This information is not intended to replace advice given to you by your health care provider. Make sure you discuss any questions you have with your health care provider.  Cc Ricardo Jericho, MD

## 2014-03-21 NOTE — Progress Notes (Signed)
    History of Present Illness: This is a 43 year old male with a long history of GERD. His symptoms have gradually worsened over the past few years. If he misses just 1 dose of medication he has significant postprandial reflux symptoms. He underwent upper endoscopy in 2006 with findings of a small hiatal hernia. He states AcipHex has worked the best for controlling his reflux symptoms and he has tried Nexium and Dexilant over the past few years. He notes a slight change in bowel habits while he was on antibiotics but these symptoms resolved. Denies weight loss, abdominal pain, constipation, diarrhea, change in stool caliber, melena, hematochezia, nausea, vomiting, dysphagia, chest pain.  Current Medications, Allergies, Past Medical History, Past Surgical History, Family History and Social History were reviewed in [employer] record.  Physical Exam: General: Well developed , well nourished, no acute distress Head: Normocephalic and atraumatic Eyes:  sclerae anicteric, EOMI Ears: Normal auditory acuity Mouth: No deformity or lesions Lungs: Clear throughout to auscultation Heart: Regular rate and rhythm; no murmurs, rubs or bruits Abdomen: Soft, non tender and non distended. No masses, hepatosplenomegaly or hernias noted. Normal Bowel sounds Musculoskeletal: Symmetrical with no gross deformities  Pulses:  Normal pulses noted Extremities: No clubbing, cyanosis, edema or deformities noted Neurological: Alert oriented x 4, grossly nonfocal Psychological:  Alert and cooperative. Normal mood and affect  Assessment and Recommendations:  1. GERD. Standard antireflux measures. Retry AcipHex 20 mg daily if it is covered by his insurance plan. Rule out esophagitis, ulcer. Schedule upper endoscopy. The risks, benefits, and alternatives to endoscopy with possible biopsy and possible dilation were discussed with the patient and they consent to proceed.

## 2014-03-22 ENCOUNTER — Encounter: Payer: Self-pay | Admitting: Gastroenterology

## 2014-03-27 ENCOUNTER — Encounter: Payer: Self-pay | Admitting: Gastroenterology

## 2014-04-04 ENCOUNTER — Encounter: Payer: 59 | Admitting: Gastroenterology

## 2014-04-12 ENCOUNTER — Encounter: Payer: Self-pay | Admitting: Gastroenterology

## 2014-04-12 ENCOUNTER — Ambulatory Visit (AMBULATORY_SURGERY_CENTER): Payer: 59 | Admitting: Gastroenterology

## 2014-04-12 VITALS — BP 112/84 | HR 80 | Temp 97.7°F | Resp 15 | Ht 68.0 in | Wt 183.0 lb

## 2014-04-12 DIAGNOSIS — K229 Disease of esophagus, unspecified: Secondary | ICD-10-CM

## 2014-04-12 DIAGNOSIS — K2270 Barrett's esophagus without dysplasia: Secondary | ICD-10-CM

## 2014-04-12 DIAGNOSIS — K219 Gastro-esophageal reflux disease without esophagitis: Secondary | ICD-10-CM

## 2014-04-12 HISTORY — PX: ESOPHAGOGASTRODUODENOSCOPY: SHX1529

## 2014-04-12 HISTORY — DX: Barrett's esophagus without dysplasia: K22.70

## 2014-04-12 MED ORDER — SODIUM CHLORIDE 0.9 % IV SOLN: 500.0000 mL | INTRAVENOUS | Status: DC

## 2014-04-12 NOTE — Patient Instructions (Signed)

## 2014-04-12 NOTE — Progress Notes (Signed)
Report to PACU, RN, vss, BBS= Clear.  

## 2014-04-12 NOTE — Progress Notes (Signed)
Called to room to assist during endoscopic procedure.  Patient ID and intended procedure confirmed with present staff. Received instructions for my participation in the procedure from the performing physician.  

## 2014-04-12 NOTE — Op Note (Signed)
[place]  [address] [place], [address]   ENDOSCOPY PROCEDURE REPORT  PATIENT: Benjamin [name], Benjamin [name]  MR#: [id number] BIRTHDATE: [birth year] , 43  yrs. old GENDER: male ENDOSCOPIST: Ladene Artist, MD, Mar[facility] Regal REFERRED BY:  Ricardo Jericho, M.D. PROCEDURE DATE:  04/12/2014 PROCEDURE:  EGD w/ biopsy ASA CLASS:     Class II INDICATIONS:  history of esophageal reflux and screening for Barrett's. MEDICATIONS: Monitored anesthesia care and Propofol 200 mg IV TOPICAL ANESTHETIC: none DESCRIPTION OF PROCEDURE: After the risks benefits and alternatives of the procedure were thoroughly explained, informed consent was obtained.  The LB [id number] endoscope was introduced through the mouth and advanced to the second portion of the duodenum , Without limitations.  The instrument was slowly withdrawn as the mucosa was fully examined.    ESOPHAGUS: The z-line was located 40cm from the incisors.  The z-line appeared variable.   The esophagus was otherwise normal. Multiple biopsies were performed. STOMACH: The mucosa and folds of the stomach appeared normal. DUODENUM: The duodenal mucosa showed no abnormalities.  Retroflexed views revealed a hiatal hernia.     The scope was then withdrawn from the patient and the procedure completed.  COMPLICATIONS: There were no immediate complications.  ENDOSCOPIC IMPRESSION: 1.   z-line was variable, located 40cm from the incisors 2.   Small hiatal hernia 3.   The EGD was otherwise normal.  RECOMMENDATIONS: 1.  Await pathology results 2.  Anti-reflux regimen 3.  Continue PPI  eSigned:  Ladene Artist, MD, [facility] 04/12/2014 9:17 AM

## 2014-04-13 ENCOUNTER — Telehealth: Payer: Self-pay | Admitting: *Deleted

## 2014-04-13 NOTE — Telephone Encounter (Signed)
No answer, left message to call if questions or concerns. 

## 2014-04-17 ENCOUNTER — Encounter: Payer: Self-pay | Admitting: Gastroenterology

## 2014-04-30 ENCOUNTER — Other Ambulatory Visit: Payer: Self-pay | Admitting: Family Medicine

## 2014-04-30 MED ORDER — BISOPROLOL-HYDROCHLOROTHIAZIDE 5-6.25 MG PO TABS: 1.0000 | ORAL_TABLET | Freq: Every day | ORAL | Status: DC

## 2014-06-01 DIAGNOSIS — K5792 Diverticulitis of intestine, part unspecified, without perforation or abscess without bleeding: Secondary | ICD-10-CM

## 2014-06-01 DIAGNOSIS — H269 Unspecified cataract: Secondary | ICD-10-CM

## 2014-06-01 DIAGNOSIS — J189 Pneumonia, unspecified organism: Secondary | ICD-10-CM

## 2014-06-01 HISTORY — DX: Unspecified cataract: H26.9

## 2014-06-01 HISTORY — DX: Diverticulitis of intestine, part unspecified, without perforation or abscess without bleeding: K57.92

## 2014-06-01 HISTORY — DX: Pneumonia, unspecified organism: J18.9

## 2014-06-08 ENCOUNTER — Encounter: Payer: Self-pay | Admitting: Family Medicine

## 2014-06-08 ENCOUNTER — Ambulatory Visit (INDEPENDENT_AMBULATORY_CARE_PROVIDER_SITE_OTHER): Payer: 59 | Admitting: Family Medicine

## 2014-06-08 VITALS — BP 126/84 | HR 83 | Temp 99.1°F | Resp 16 | Ht 69.0 in | Wt 188.0 lb

## 2014-06-08 DIAGNOSIS — J189 Pneumonia, unspecified organism: Secondary | ICD-10-CM

## 2014-06-08 MED ORDER — CEFTRIAXONE SODIUM 1 G IJ SOLR
1.0000 g | Freq: Once | INTRAMUSCULAR | Status: AC
  Administered 2014-06-08: 1 g via INTRAMUSCULAR

## 2014-06-08 MED ORDER — HYDROCODONE-HOMATROPINE 5-1.5 MG/5ML PO SYRP: 5.0000 mL | ORAL_SOLUTION | Freq: Three times a day (TID) | ORAL | Status: DC | PRN

## 2014-06-08 MED ORDER — AZITHROMYCIN 250 MG PO TABS: ORAL_TABLET | ORAL | Status: DC

## 2014-06-08 NOTE — Progress Notes (Signed)
Pre visit review using our clinic review tool, if applicable. No additional management support is needed unless otherwise documented below in the visit note. 

## 2014-06-08 NOTE — Progress Notes (Signed)
OFFICE NOTE  06/08/2014  CC:  Chief Complaint  Patient presents with  . Generalized Body Aches    x 3 days  . Cough   HPI: Patient is a 44 y.o. Caucasian male who is here for respiratory illness. Onset 3 d/a with chills/body aches, pounding HA, nasal congestion, coughing a lot but minimally productive of phlegm.  No ST. Tm 99.5 but +subjective fever.  Some periods of lightheadedness.  A little nausea, no vomiting or diarrhea. No rash.  No recent foreign travel.  He did get a flu vaccine this season.  Pertinent PMH:  Past medical, surgical, social, and family history reviewed and no changes are noted since last office visit.  MEDS:  Outpatient Prescriptions Prior to Visit  Medication Sig Dispense Refill  . bisoprolol-hydrochlorothiazide (ZIAC) 5-6.25 MG per tablet Take 1 tablet by mouth daily. 30 tablet 3  . buPROPion (WELLBUTRIN XL) 300 MG 24 hr tablet TAKE ONE TABLET BY MOUTH ONE TIME DAILY 30 tablet 3  . rosuvastatin (CRESTOR) 40 MG tablet Take 1 tablet (40 mg total) by mouth daily. 30 tablet 11  . traMADol (ULTRAM) 50 MG tablet 1-2 tabs po q6h prn severe headache 30 tablet 1  . valACYclovir (VALTREX) 500 MG tablet TAKE ONE TABLET BY MOUTH ONE TIME DAILY  30 tablet 5  . RABEprazole (ACIPHEX) 20 MG tablet Take 1 tablet (20 mg total) by mouth daily. 30 tablet 2  . cetirizine-pseudoephedrine (ZYRTEC-D) 5-120 MG per tablet Take 1 tablet by mouth 2 (two) times daily.     No facility-administered medications prior to visit.    PE: Blood pressure 126/84, pulse 83, temperature 99.1 F (37.3 C), temperature source Temporal, resp. rate 16, height 5\' 9"  (1.753 m), weight 188 lb (85.276 kg), SpO2 97 %. VS: noted--normal. Gen: alert, NAD, NONTOXIC APPEARING. HEENT: eyes without injection, drainage, or swelling.  Ears: EACs clear, TMs with normal light reflex and landmarks.  Nose: Clear rhinorrhea, with some dried, crusty exudate adherent to mildly injected mucosa.  No purulent d/c.  No  paranasal sinus TTP.  No facial swelling.  Throat and mouth without focal lesion.  No pharyngial swelling, erythema, or exudate.   Neck: supple, no LAD.   LUNGS: CTA bilat except for left basilar insp crackles and diminished BS, nonlabored resps.   CV: RRR, no m/r/g. EXT: no c/c/e SKIN: no rash  LAB: none today  IMPRESSION AND PLAN:  CAP:  Rocephin 1g IM (pt says he has no penicillin allergy.  He says his mom did and he has simply noted it as an allergy of his all his life siimply b/c his mom had allergy to it).   Z-pack rx'd to start tonight.  Hycodan syrup 1-2 tsp q6h prn, #240 ml. Signs/symptoms to call or return for were reviewed and pt expressed understanding.  An After Visit Summary was printed and given to the patient.  FOLLOW UP: prn

## 2014-06-10 ENCOUNTER — Encounter (HOSPITAL_COMMUNITY): Payer: Self-pay | Admitting: Emergency Medicine

## 2014-06-10 ENCOUNTER — Emergency Department (HOSPITAL_COMMUNITY)
Admission: EM | Admit: 2014-06-10 | Discharge: 2014-06-11 | Disposition: A | Discharge status: Home / Self Care | Payer: 59 | Attending: Emergency Medicine | Admitting: Emergency Medicine

## 2014-06-10 ENCOUNTER — Emergency Department (HOSPITAL_COMMUNITY): Payer: 59

## 2014-06-10 ENCOUNTER — Emergency Department (HOSPITAL_BASED_OUTPATIENT_CLINIC_OR_DEPARTMENT_OTHER)
Admission: EM | Admit: 2014-06-10 | Discharge: 2014-06-10 | Disposition: A | Discharge status: Home / Self Care | Payer: 59

## 2014-06-10 DIAGNOSIS — J219 Acute bronchiolitis, unspecified: Secondary | ICD-10-CM | POA: Insufficient documentation

## 2014-06-10 DIAGNOSIS — G8929 Other chronic pain: Secondary | ICD-10-CM | POA: Diagnosis not present

## 2014-06-10 DIAGNOSIS — F419 Anxiety disorder, unspecified: Secondary | ICD-10-CM | POA: Diagnosis not present

## 2014-06-10 DIAGNOSIS — Z880 Allergy status to penicillin: Secondary | ICD-10-CM | POA: Diagnosis not present

## 2014-06-10 DIAGNOSIS — J159 Unspecified bacterial pneumonia: Secondary | ICD-10-CM | POA: Insufficient documentation

## 2014-06-10 DIAGNOSIS — Z8719 Personal history of other diseases of the digestive system: Secondary | ICD-10-CM | POA: Diagnosis not present

## 2014-06-10 DIAGNOSIS — M791 Myalgia: Secondary | ICD-10-CM | POA: Insufficient documentation

## 2014-06-10 DIAGNOSIS — E785 Hyperlipidemia, unspecified: Secondary | ICD-10-CM | POA: Diagnosis not present

## 2014-06-10 DIAGNOSIS — R059 Cough, unspecified: Secondary | ICD-10-CM

## 2014-06-10 DIAGNOSIS — Z872 Personal history of diseases of the skin and subcutaneous tissue: Secondary | ICD-10-CM | POA: Diagnosis not present

## 2014-06-10 DIAGNOSIS — Z79899 Other long term (current) drug therapy: Secondary | ICD-10-CM | POA: Insufficient documentation

## 2014-06-10 DIAGNOSIS — Z87448 Personal history of other diseases of urinary system: Secondary | ICD-10-CM | POA: Insufficient documentation

## 2014-06-10 DIAGNOSIS — R05 Cough: Secondary | ICD-10-CM

## 2014-06-10 DIAGNOSIS — K219 Gastro-esophageal reflux disease without esophagitis: Secondary | ICD-10-CM | POA: Diagnosis not present

## 2014-06-10 DIAGNOSIS — J9801 Acute bronchospasm: Secondary | ICD-10-CM

## 2014-06-10 DIAGNOSIS — I10 Essential (primary) hypertension: Secondary | ICD-10-CM | POA: Insufficient documentation

## 2014-06-10 DIAGNOSIS — J189 Pneumonia, unspecified organism: Secondary | ICD-10-CM

## 2014-06-10 MED ORDER — ALBUTEROL SULFATE HFA 108 (90 BASE) MCG/ACT IN AERS
2.0000 | INHALATION_SPRAY | RESPIRATORY_TRACT | Status: DC | PRN
  Administered 2014-06-11: 2 via RESPIRATORY_TRACT
  Filled 2014-06-10: qty 6.7

## 2014-06-10 MED ORDER — AEROCHAMBER PLUS FLO-VU LARGE MISC
1.0000 | Freq: Once | Status: AC
  Administered 2014-06-11: 1
  Filled 2014-06-10: qty 1

## 2014-06-10 MED ORDER — IPRATROPIUM-ALBUTEROL 0.5-2.5 (3) MG/3ML IN SOLN
3.0000 mL | Freq: Once | RESPIRATORY_TRACT | Status: AC
  Administered 2014-06-10: 3 mL via RESPIRATORY_TRACT
  Filled 2014-06-10: qty 3

## 2014-06-10 NOTE — Discharge Instructions (Signed)
Your chest x-ray confirms the diagnosis of community-acquired pneumonia, your on the appropriate antibiotic.  Please continue taking this as directed by your physician.  You're also having bronchospasm.  You have been provided with an inhaler.  Please uses as follows 2 puffs every 4-6 hours while awake for the next 2 days, then as needed thereafter.  This will also decrease the amount of coughing that you're doing.  Please call your primary care physician in the morning to report the addition of the inhaler and follow his directions as far as follow-up care

## 2014-06-10 NOTE — ED Notes (Signed)
Pt arrived to the ED with a complaint of a cough.  Pt states he was seen at his PCP on Friday given IM rocephin and given a z-pak.  Pt states he has had a cough as well and the medication given helps a little but has not alleviated his symptoms.  Pt states after he coughs he has a "crackling" sound from his throat.

## 2014-06-10 NOTE — ED Notes (Signed)
Pt came to [facility] [place] and stated he was leaving due the large number of pt in waiting area and that he would follow up with his primary medical doctor in the AM. He was encouraged to be seen at [facility] but he decline and left prior to triage assessment.

## 2014-06-10 NOTE — ED Provider Notes (Signed)
CSN: [id number]     Arrival date & time 06/10/14  2021 History  This chart was scribed for non-physician practitioner, Junius Creamer, [place] working with Tanna Furry, MD by Frederich Balding, ED scribe. This patient was seen in room [place] and the patient's care was started at 10:30 PM.   Chief Complaint  Patient presents with  . Cough   The history is provided by the patient. No language interpreter was used.    HPI Comments: Benjamin [name] is a 44 y.o. male who presents to the Emergency Department complaining of cough, chills and generalized body aches that started 5 days ago. He was evaluated by his PCP 3 days ago for the same, treated for presumptive pneumonia and given hycodan, zyrtec D, and azithromycin. States he was also given IM rocephin in the office. Medications have provided little relief and states cough worsened last night. Also reports mild wheezing that started last night. Denies nasal congestion, postnasal drip. Denies history of bronchitis.   Past Medical History  Diagnosis Date  . ALLERGIC RHINITIS 07/11/2008  . BACK PAIN, CHRONIC, INTERMITTENT 10/17/2008  . BENIGN PROSTATIC HYPERTROPHY, WITH URINARY OBSTRUCTION 07/21/2007  . BURSITIS, HIP 09/30/2009  . HYPERTENSION 01/05/2007  . PSORIASIS 01/05/2007  . Herpes labialis   . Hyperlipidemia   . Allergy   . GERD (gastroesophageal reflux disease)   . Anxiety   . Hiatal hernia    Past Surgical History  Procedure Laterality Date  . Lipoma excision      several, arm   Family History  Problem Relation Age of Onset  . Colon cancer      neg hx  . Prostate cancer      neg hx  . CAD Father     around age 76  . Heart disease Father   . Hyperlipidemia Father   . Hypertension Father   . CAD Paternal Grandmother   . Hypertension Mother   . Hypertension Brother   . Hyperlipidemia Brother   . Lung cancer Maternal Grandmother   . Cancer Maternal Grandfather     type unknown  . Diabetes Paternal Grandmother    History  Substance  Use Topics  . Smoking status: Never Smoker   . Smokeless tobacco: Never Used  . Alcohol Use: 1.2 oz/week    2 Cans of beer per week    Review of Systems  Constitutional: Positive for chills.  HENT: Negative for congestion and postnasal drip.   Respiratory: Positive for cough and wheezing.   Musculoskeletal: Positive for myalgias.  All other systems reviewed and are negative.  Allergies  Bismuth subsalicylate and Penicillins  Home Medications   Prior to Admission medications   Medication Sig Start Date End Date Taking? Authorizing Provider  azithromycin (ZITHROMAX) 250 MG tablet 2 tabs po qd x 1d, then 1 tab po qd x 4d Patient taking differently: Take 250 mg by mouth daily.  06/08/14  Yes Tammi Sou, MD  bisoprolol-hydrochlorothiazide [facility]) 5-6.25 MG per tablet Take 1 tablet by mouth daily. 04/30/14  Yes Tammi Sou, MD  buPROPion (WELLBUTRIN XL) 300 MG 24 hr tablet TAKE ONE TABLET BY MOUTH ONE TIME DAILY 03/14/14  Yes Tammi Sou, MD  DEXILANT 60 MG capsule Take 60 mg by mouth daily. 06/04/14  Yes Historical Provider, MD  HYDROcodone-homatropine (HYCODAN) 5-1.5 MG/5ML syrup Take 5 mLs by mouth every 8 (eight) hours as needed for cough. 06/08/14  Yes Tammi Sou, MD  rosuvastatin (CRESTOR) 40 MG tablet Take 1 tablet (  40 mg total) by mouth daily. Patient taking differently: Take 40 mg by mouth every 7 (seven) days.  12/18/13  Yes Ricard Dillon, MD  traMADol (ULTRAM) 50 MG tablet 1-2 tabs po q6h prn severe headache Patient taking differently: Take 50 mg by mouth every 6 (six) hours as needed for moderate pain.  02/19/14  Yes Tammi Sou, MD  valACYclovir (VALTREX) 500 MG tablet TAKE ONE TABLET BY MOUTH ONE TIME DAILY  09/27/13  Yes Ricard Dillon, MD  cetirizine-pseudoephedrine (ZYRTEC-D) 5-120 MG per tablet Take 1 tablet by mouth 2 (two) times daily.    Historical Provider, MD   BP 126/88 mmHg  Pulse 71  Temp(Src) 97.7 F (36.5 C) (Oral)  Resp 16  Ht 5\' 9"   (1.753 m)  Wt 188 lb (85.276 kg)  BMI 27.75 kg/m2  SpO2 98%   Physical Exam  Constitutional: He is oriented to person, place, and time. He appears well-developed and well-nourished. No distress.  HENT:  Head: Normocephalic and atraumatic.  Eyes: Conjunctivae and EOM are normal.  Neck: Neck supple. No tracheal deviation present.  Cardiovascular: Normal rate, regular rhythm and normal heart sounds.   Pulmonary/Chest: Effort normal. No respiratory distress. He has wheezes (bilateral bases). He has no rhonchi. He has no rales.  Musculoskeletal: Normal range of motion.  Neurological: He is alert and oriented to person, place, and time.  Skin: Skin is warm and dry.  Psychiatric: He has a normal mood and affect. His behavior is normal.  Nursing note and vitals reviewed.   ED Course  Procedures (including critical care time)  DIAGNOSTIC STUDIES: Oxygen Saturation is 98% on RA, normal by my interpretation.    COORDINATION OF CARE: 10:32 PM-Discussed treatment plan which includes chest xray and breathing treatment with pt at bedside and pt agreed to plan.   Labs Review Labs Reviewed - No data to display  Imaging Review Dg Chest 2 View  06/10/2014   CLINICAL DATA:  Cough.  EXAM: CHEST  2 VIEW  COMPARISON:  None.  FINDINGS: Patchy airspace opacity in the lingula and to lesser extent left lower lobe. The right lung is clear. Cardiomediastinal contours are normal. There is no pleural effusion or pneumothorax. Pulmonary vasculature is normal. No acute osseous abnormality is seen.  IMPRESSION: Patchy airspace opacity in the lingula and left lower lobe consistent with pneumonia.   Electronically Signed   By: Jeb Levering M.D.   On: 06/10/2014 23:23     EKG Interpretation None      MDM   Final diagnoses:  Cough  Bronchospasm  CAP (community acquired pneumonia)       I personally performed the services described in this documentation, which was scribed in my presence. The  recorded information has been reviewed and is accurate.  Garald Balding, NP 06/10/14 [id number]  Tanna Furry, MD 06/19/14 [phone]

## 2014-06-10 NOTE — ED Notes (Signed)
Patient reports taking cough medication before coming to ED, states it is working which is why he is not coughing at present time.

## 2014-06-11 ENCOUNTER — Encounter: Payer: Self-pay | Admitting: Family Medicine

## 2014-06-14 ENCOUNTER — Other Ambulatory Visit: Payer: 59

## 2014-06-15 ENCOUNTER — Ambulatory Visit (INDEPENDENT_AMBULATORY_CARE_PROVIDER_SITE_OTHER): Payer: 59 | Admitting: Nurse Practitioner

## 2014-06-15 ENCOUNTER — Encounter: Payer: Self-pay | Admitting: Nurse Practitioner

## 2014-06-15 VITALS — BP 118/76 | HR 78 | Temp 98.2°F | Ht 69.0 in | Wt 188.0 lb

## 2014-06-15 DIAGNOSIS — J181 Lobar pneumonia, unspecified organism: Principal | ICD-10-CM

## 2014-06-15 DIAGNOSIS — J189 Pneumonia, unspecified organism: Secondary | ICD-10-CM

## 2014-06-15 NOTE — Patient Instructions (Signed)
Lung sounds are clear.  Fatigue should improve over the next several weeks. Continue to use inhaler if needed for persistent cough.  Start daily sinus rinses (Neilmed Sinus Rinse). Use daily until post nasal drip clears-you may need it for another week.  Please get chest xray in 6 weeks. We will call with results.  Let us know if you have concerns.

## 2014-06-15 NOTE — Progress Notes (Signed)
Pre visit review using our clinic review tool, if applicable. No additional management support is needed unless otherwise documented below in the visit note. 

## 2014-06-15 NOTE — Progress Notes (Signed)
Subjective:    Benjamin [name] is a 44 y.o. male who presents for follow up of pneumonia. He was seen 06/08/14 by PCP, given im rocephin & started on azithromycin. Pt went to ER 2 days later due to persistent cough. CXR performed: LLL pneumonia. Pt was given script for alb inhaler. No chng in ABX.  Today pt states he continues to feel fatigued, but is much better-coughing less, no fever or chest pain. Seems to cough more when lays down. Has used inhaler several times in last week, not in last few days.    The following portions of the patient's history were reviewed and updated as appropriate: allergies, current medications, past medical history, past social history, past surgical history and problem list.  Review of Systems Constitutional: negative for night sweats Ears, nose, mouth, throat, and face: negative for earaches and sore throat Respiratory: negative for sputum and wheezing Gastrointestinal: negative for diarrhea   Objective:     BP 118/76 mmHg  Pulse 78  Temp(Src) 98.2 F (36.8 C) (Temporal)  Ht 5\' 9"  (1.753 m)  Wt 188 lb (85.276 kg)  BMI 27.75 kg/m2  SpO2 100% General appearance: alert, cooperative, appears stated age and no distress Head: Normocephalic, without obvious abnormality, atraumatic Eyes: negative findings: lids and lashes normal and conjunctivae and sclerae normal Ears: clear fluid bilat tm, bones visible, no bulge Lungs: clear to auscultation bilaterally Heart: regular rate and rhythm, S1, S2 normal, no murmur, click, rub or gallop   Assessment:Plan   1. LLL pneumonia resolving - DG Chest 2 View; Future 6 weeks  F/u PRN

## 2014-06-18 ENCOUNTER — Other Ambulatory Visit (INDEPENDENT_AMBULATORY_CARE_PROVIDER_SITE_OTHER): Payer: 59

## 2014-06-18 ENCOUNTER — Telehealth: Payer: Self-pay

## 2014-06-18 DIAGNOSIS — R3911 Hesitancy of micturition: Secondary | ICD-10-CM

## 2014-06-18 DIAGNOSIS — I10 Essential (primary) hypertension: Secondary | ICD-10-CM

## 2014-06-18 DIAGNOSIS — Z0000 Encounter for general adult medical examination without abnormal findings: Secondary | ICD-10-CM

## 2014-06-18 LAB — POCT URINALYSIS DIPSTICK
Bilirubin, UA: NEGATIVE
Blood, UA: NEGATIVE
Glucose, UA: NEGATIVE
Ketones, UA: NEGATIVE
Leukocytes, UA: NEGATIVE
Nitrite, UA: NEGATIVE
Protein, UA: NEGATIVE
Spec Grav, UA: 1.02
Urobilinogen, UA: 0.2
pH, UA: 5.5

## 2014-06-18 LAB — CBC WITH DIFFERENTIAL/PLATELET
Basophils Absolute: 0.1 K/uL (ref 0.0–0.1)
Basophils Relative: 1 % (ref 0.0–3.0)
Eosinophils Absolute: 0.3 K/uL (ref 0.0–0.7)
Eosinophils Relative: 3.5 % (ref 0.0–5.0)
HCT: 46.3 % (ref 39.0–52.0)
Hemoglobin: 15.8 g/dL (ref 13.0–17.0)
Lymphocytes Relative: 27.4 % (ref 12.0–46.0)
Lymphs Abs: 2.3 K/uL (ref 0.7–4.0)
MCHC: 34.1 g/dL (ref 30.0–36.0)
MCV: 89.5 fl (ref 78.0–100.0)
Monocytes Absolute: 0.5 K/uL (ref 0.1–1.0)
Monocytes Relative: 6.1 % (ref 3.0–12.0)
Neutro Abs: 5.3 K/uL (ref 1.4–7.7)
Neutrophils Relative %: 62 % (ref 43.0–77.0)
Platelets: 426 K/uL — ABNORMAL HIGH (ref 150.0–400.0)
RBC: 5.17 Mil/uL (ref 4.22–5.81)
RDW: 12.6 % (ref 11.5–15.5)
WBC: 8.5 K/uL (ref 4.0–10.5)

## 2014-06-18 LAB — COMPREHENSIVE METABOLIC PANEL WITH GFR
ALT: 28 U/L (ref 0–53)
AST: 20 U/L (ref 0–37)
Albumin: 4.3 g/dL (ref 3.5–5.2)
Alkaline Phosphatase: 65 U/L (ref 39–117)
BUN: 18 mg/dL (ref 6–23)
CO2: 29 meq/L (ref 19–32)
Calcium: 9.6 mg/dL (ref 8.4–10.5)
Chloride: 102 meq/L (ref 96–112)
Creatinine, Ser: 1.18 mg/dL (ref 0.40–1.50)
GFR: 71.01 mL/min (ref >60.00)
Glucose, Bld: 101 mg/dL — ABNORMAL HIGH (ref 70–99)
Potassium: 3.7 meq/L (ref 3.5–5.1)
Sodium: 137 meq/L (ref 135–145)
Total Bilirubin: 0.5 mg/dL (ref 0.2–1.2)
Total Protein: 7.3 g/dL (ref 6.0–8.3)

## 2014-06-18 LAB — LIPID PANEL
Cholesterol: 209 mg/dL — ABNORMAL HIGH (ref 0–200)
HDL: 34 mg/dL — ABNORMAL LOW (ref >39.00)
NonHDL: 175
Total CHOL/HDL Ratio: 6
Triglycerides: 390 mg/dL — ABNORMAL HIGH (ref 0.0–149.0)
VLDL: 78 mg/dL — ABNORMAL HIGH (ref 0.0–40.0)

## 2014-06-18 LAB — TSH: TSH: 0.98 u[IU]/mL (ref 0.35–4.50)

## 2014-06-18 LAB — LDL CHOLESTEROL, DIRECT: Direct LDL: 96 mg/dL

## 2014-06-18 LAB — PSA: PSA: 0.46 ng/mL (ref 0.10–4.00)

## 2014-06-18 NOTE — Telephone Encounter (Signed)
Pt came in for bloodwork and left a urine sample. Did you want to order anything on it? He states he is having slow stream when urinating and has a history of enlarged prostate. Please advise.

## 2014-06-18 NOTE — Telephone Encounter (Signed)
Do UA and send for c/s, dx is urinary hesitancy--thx

## 2014-06-19 LAB — URINE CULTURE
Colony Count: NO GROWTH
Organism ID, Bacteria: NO GROWTH

## 2014-06-19 NOTE — Telephone Encounter (Signed)
Urine culture added

## 2014-06-21 ENCOUNTER — Encounter: Payer: 59 | Admitting: Family Medicine

## 2014-06-28 ENCOUNTER — Ambulatory Visit (INDEPENDENT_AMBULATORY_CARE_PROVIDER_SITE_OTHER): Payer: 59 | Admitting: Family Medicine

## 2014-06-28 ENCOUNTER — Encounter: Payer: Self-pay | Admitting: Family Medicine

## 2014-06-28 VITALS — BP 132/88 | HR 71 | Temp 97.5°F | Resp 16 | Ht 71.0 in | Wt 188.0 lb

## 2014-06-28 DIAGNOSIS — N138 Other obstructive and reflux uropathy: Secondary | ICD-10-CM

## 2014-06-28 DIAGNOSIS — Z0000 Encounter for general adult medical examination without abnormal findings: Secondary | ICD-10-CM | POA: Insufficient documentation

## 2014-06-28 DIAGNOSIS — H269 Unspecified cataract: Secondary | ICD-10-CM

## 2014-06-28 DIAGNOSIS — N401 Enlarged prostate with lower urinary tract symptoms: Secondary | ICD-10-CM

## 2014-06-28 DIAGNOSIS — H547 Unspecified visual loss: Secondary | ICD-10-CM

## 2014-06-28 MED ORDER — TAMSULOSIN HCL 0.4 MG PO CAPS: ORAL_CAPSULE | ORAL | Status: DC

## 2014-06-28 NOTE — Assessment & Plan Note (Signed)
Restart flomax, start at 0.4mg  qhs, increase to 0.8mg  after 3d if not much improved. I'll see him back in 1 month and we'll see if a 5 alpha reductase inhibitor has to be added.

## 2014-06-28 NOTE — Progress Notes (Signed)
Office Note 06/28/2014  CC:  Chief Complaint  Patient presents with  . Annual Exam    not fasting   HPI:  Benjamin [name] is a 43 y.o. White male who is here for CPE today. We reviewed his recent fasting lab work today in detail.  Trigs up but he admits he ate something the morning of his lab draw. He will remain on crestor 40mg  once a week.  Has several year hx of urinary hesitancy, difficulty getting his stream started (sometimes a couple minutes in the morning), and weak urinary stream.  No nocturia, some days has urinary frequency some days does not.  No dysuria or hematuria. Did a trial of flomax in the past and this helped but he got off for confusing reasons. Hx of prostatitis: treatment helped but still with baseline obstructive BPH sx's.  Reports past hx of gonorrhea, was tx'd with rocephin IM (in college).  He does say he has noted a gradual decrease in visual acuity of his right eye over the last 1-2 yrs, esp with reading menus, books.   Past Medical History  Diagnosis Date  . ALLERGIC RHINITIS 07/11/2008  . BACK PAIN, CHRONIC, INTERMITTENT 10/17/2008  . BENIGN PROSTATIC HYPERTROPHY, WITH URINARY OBSTRUCTION 07/21/2007  . BURSITIS, HIP 09/30/2009  . HYPERTENSION 01/05/2007  . PSORIASIS 01/05/2007  . Herpes labialis   . Hyperlipidemia   . Allergy   . GERD (gastroesophageal reflux disease)   . Anxiety   . Hiatal hernia   . CAP (community acquired pneumonia) 06/2014    Past Surgical History  Procedure Laterality Date  . Lipoma excision      several, arm    Family History  Problem Relation Age of Onset  . Colon cancer      neg hx  . Prostate cancer      neg hx  . CAD Father     around age 91  . Heart disease Father   . Hyperlipidemia Father   . Hypertension Father   . CAD Paternal Grandmother   . Hypertension Mother   . Hypertension Brother   . Hyperlipidemia Brother   . Lung cancer Maternal Grandmother   . Cancer Maternal Grandfather     type unknown   . Diabetes Paternal Grandmother     History   Social History  . Marital Status: Married    Spouse Name: N/A    Number of Children: 2  . Years of Education: N/A   Occupational History  . account manager [employer]   Social History Main Topics  . Smoking status: Never Smoker   . Smokeless tobacco: Never Used  . Alcohol Use: 1.2 oz/week    2 Cans of beer per week  . Drug Use: No  . Sexual Activity: Yes    Birth Control/ Protection: None   Other Topics Concern  . Not on file   Social History Narrative   Currently separated as of 01/2014, 2 children.  One younger brother.   Occupation: Press photographer for [employer].   No tobacco.   Occ alcohol (1-2 x/month).  No hx of alc/drug prob.   Exercise: runs about 2 times a week.   Diet: regular american diet.            MEDS: Not taking aciphex, zyrtec D, azithromycin, or hycodan listed below Outpatient Prescriptions Prior to Visit  Medication Sig Dispense Refill  . bisoprolol-hydrochlorothiazide (ZIAC) 5-6.25 MG per tablet Take 1 tablet by mouth daily. 30 tablet  3  . buPROPion (WELLBUTRIN XL) 300 MG 24 hr tablet TAKE ONE TABLET BY MOUTH ONE TIME DAILY 30 tablet 3  . DEXILANT 60 MG capsule Take 60 mg by mouth daily.    . rosuvastatin (CRESTOR) 40 MG tablet Take 1 tablet (40 mg total) by mouth daily. (Patient taking differently: Take 40 mg by mouth every 7 (seven) days. ) 30 tablet 11  . traMADol (ULTRAM) 50 MG tablet 1-2 tabs po q6h prn severe headache (Patient taking differently: Take 50 mg by mouth every 6 (six) hours as needed for moderate pain. ) 30 tablet 1  . valACYclovir (VALTREX) 500 MG tablet TAKE ONE TABLET BY MOUTH ONE TIME DAILY  30 tablet 5  . cetirizine-pseudoephedrine (ZYRTEC-D) 5-120 MG per tablet Take 1 tablet by mouth 2 (two) times daily.    Marland Kitchen azithromycin (ZITHROMAX) 250 MG tablet 2 tabs po qd x 1d, then 1 tab po qd x 4d (Patient not taking: Reported on 06/15/2014) 6 tablet 0  .  HYDROcodone-homatropine (HYCODAN) 5-1.5 MG/5ML syrup Take 5 mLs by mouth every 8 (eight) hours as needed for cough. 240 mL 0  . RABEprazole (ACIPHEX) 20 MG tablet      No facility-administered medications prior to visit.    Allergies  Allergen Reactions  . Bismuth Subsalicylate Swelling  . Penicillins Other (See Comments)    Childhood allergy    ROS Review of Systems  Constitutional: Negative for fever, chills, appetite change and fatigue.  HENT: Negative for congestion, dental problem, ear pain and sore throat.   Eyes: Negative for discharge, redness and visual disturbance.  Respiratory: Negative for cough, chest tightness, shortness of breath and wheezing.   Cardiovascular: Negative for chest pain, palpitations and leg swelling.  Gastrointestinal: Negative for nausea, vomiting, abdominal pain, diarrhea and blood in stool.  Genitourinary: Negative for dysuria, urgency, frequency, hematuria, flank pain and difficulty urinating.  Musculoskeletal: Negative for myalgias, back pain, joint swelling, arthralgias and neck stiffness.  Skin: Negative for pallor and rash.  Neurological: Negative for dizziness, speech difficulty, weakness and headaches.  Hematological: Negative for adenopathy. Does not bruise/bleed easily.  Psychiatric/Behavioral: Negative for confusion and sleep disturbance. The patient is not nervous/anxious.     PE; Blood pressure 132/88, pulse 71, temperature 97.5 F (36.4 C), temperature source Temporal, resp. rate 16, height 5\' 11"  (1.803 m), weight 188 lb (85.276 kg), SpO2 100 %. Gen: Alert, well appearing.  Patient is oriented to person, place, time, and situation. AFFECT: pleasant, lucid thought and speech. ENT: Ears: EACs clear, normal epithelium.  TMs with good light reflex and landmarks bilaterally.  Eyes: no injection, icteris, swelling, or exudate.  EOMI, PERRLA. Nose: no drainage or turbinate edema/swelling.  No injection or focal lesion.  Mouth: lips without  lesion/swelling.  Oral mucosa pink and moist.  Dentition intact and without obvious caries or gingival swelling.  Oropharynx without erythema, exudate, or swelling.  Neck: supple/nontender.  No LAD, mass, or TM.  Carotid pulses 2+ bilaterally, without bruits. CV: RRR, no m/r/g.   LUNGS: CTA bilat, nonlabored resps, good aeration in all lung fields. ABD: soft, NT, ND, BS normal.  No hepatospenomegaly or mass.  No bruits. EXT: no clubbing, cyanosis, or edema.  Musculoskeletal: no joint swelling, erythema, warmth, or tenderness.  ROM of all joints intact. Skin - no sores or suspicious lesions or rashes or color changes Rectal exam: negative without mass, lesions or tenderness,  PROSTATE EXAM: smooth and symmetric without nodules or tenderness.  Pertinent labs:  Lab Results  Component Value Date   TSH 0.98 06/18/2014   Lab Results  Component Value Date   WBC 8.5 06/18/2014   HGB 15.8 06/18/2014   HCT 46.3 06/18/2014   MCV 89.5 06/18/2014   PLT 426.0* 06/18/2014   Lab Results  Component Value Date   CREATININE 1.18 06/18/2014   BUN 18 06/18/2014   NA 137 06/18/2014   K 3.7 06/18/2014   CL 102 06/18/2014   CO2 29 06/18/2014   Lab Results  Component Value Date   ALT 28 06/18/2014   AST 20 06/18/2014   ALKPHOS 65 06/18/2014   BILITOT 0.5 06/18/2014   Lab Results  Component Value Date   CHOL 209* 06/18/2014   Lab Results  Component Value Date   HDL 34.00* 06/18/2014   Lab Results  Component Value Date   LDLCALC 117* 12/12/2013   Lab Results  Component Value Date   TRIG 390.0* 06/18/2014   Lab Results  Component Value Date   CHOLHDL 6 06/18/2014   Lab Results  Component Value Date   PSA 0.46 06/18/2014    Visual Acuity Screening   Right eye Left eye Both eyes  Without correction: 20/70 20/25 20/25   With correction:       ASSESSMENT AND PLAN:   Health maintenance examination Reviewed age and gender appropriate health maintenance issues (prudent diet,  regular exercise, health risks of tobacco and excessive alcohol, use of seatbelts, fire alarms in home, use of sunscreen).  Also reviewed age and gender appropriate health screening as well as vaccine recommendations. HP labs reviewed: no changes in meds. Vaccines UTD. Continue current doses of all meds for chronic medical issues.   BPH (benign prostatic hypertrophy) with urinary obstruction Restart flomax, start at 0.4mg  qhs, increase to 0.8mg  after 3d if not much improved. I'll see him back in 1 month and we'll see if a 5 alpha reductase inhibitor has to be added.    Left eye cataract (suspected), right eye decreased visual acuity: referred patient to Macarthur Critchley, optometrist today to further evaluate these problems.  An After Visit Summary was printed and given to the patient.  FOLLOW UP:  Return in about 4 weeks (around 07/26/2014) for f/u bph.

## 2014-06-28 NOTE — Assessment & Plan Note (Signed)
Reviewed age and gender appropriate health maintenance issues (prudent diet, regular exercise, health risks of tobacco and excessive alcohol, use of seatbelts, fire alarms in home, use of sunscreen).  Also reviewed age and gender appropriate health screening as well as vaccine recommendations. HP labs reviewed: no changes in meds. Vaccines UTD. Continue current doses of all meds for chronic medical issues.

## 2014-07-02 ENCOUNTER — Encounter: Payer: Self-pay | Admitting: Family Medicine

## 2014-07-09 ENCOUNTER — Other Ambulatory Visit: Payer: Self-pay | Admitting: Family Medicine

## 2014-07-09 MED ORDER — VALACYCLOVIR HCL 500 MG PO TABS: 500.0000 mg | ORAL_TABLET | Freq: Every day | ORAL | Status: DC

## 2014-07-21 ENCOUNTER — Other Ambulatory Visit: Payer: Self-pay | Admitting: Family Medicine

## 2014-08-25 ENCOUNTER — Encounter: Payer: Self-pay | Admitting: Family Medicine

## 2014-09-11 ENCOUNTER — Encounter: Payer: Self-pay | Admitting: Family Medicine

## 2014-09-11 ENCOUNTER — Ambulatory Visit (INDEPENDENT_AMBULATORY_CARE_PROVIDER_SITE_OTHER): Payer: 59 | Admitting: Family Medicine

## 2014-09-11 VITALS — BP 120/70 | HR 74 | Temp 98.2°F | Ht 71.0 in | Wt 193.0 lb

## 2014-09-11 DIAGNOSIS — M5441 Lumbago with sciatica, right side: Secondary | ICD-10-CM | POA: Diagnosis not present

## 2014-09-11 DIAGNOSIS — J3089 Other allergic rhinitis: Secondary | ICD-10-CM

## 2014-09-11 DIAGNOSIS — G44029 Chronic cluster headache, not intractable: Secondary | ICD-10-CM | POA: Diagnosis not present

## 2014-09-11 DIAGNOSIS — K529 Noninfective gastroenteritis and colitis, unspecified: Secondary | ICD-10-CM | POA: Diagnosis not present

## 2014-09-11 MED ORDER — FLUTICASONE PROPIONATE 50 MCG/ACT NA SUSP: 2.0000 | Freq: Every day | NASAL | Status: DC

## 2014-09-11 MED ORDER — TRAMADOL HCL 50 MG PO TABS: 50.0000 mg | ORAL_TABLET | Freq: Four times a day (QID) | ORAL | Status: DC | PRN

## 2014-09-11 NOTE — Progress Notes (Signed)
Pre visit review using our clinic review tool, if applicable. No additional management support is needed unless otherwise documented below in the visit note. 

## 2014-09-11 NOTE — Patient Instructions (Signed)
Stop your cephalexin (antibiotic).  Call if your diarrhea is not continuing to improve in 2 days.  Back Exercises These exercises may help you when beginning to rehabilitate your injury. Your symptoms may resolve with or without further involvement from your physician, physical therapist or athletic trainer. While completing these exercises, remember:   Restoring tissue flexibility helps normal motion to return to the joints. This allows healthier, less painful movement and activity.  An effective stretch should be held for at least 30 seconds.  A stretch should never be painful. You should only feel a gentle lengthening or release in the stretched tissue. STRETCH - Extension, Prone on Elbows   Lie on your stomach on the floor, a bed will be too soft. Place your palms about shoulder width apart and at the height of your head.  Place your elbows under your shoulders. If this is too painful, stack pillows under your chest.  Allow your body to relax so that your hips drop lower and make contact more completely with the floor.  Hold this position for __________ seconds.  Slowly return to lying flat on the floor. Repeat __________ times. Complete this exercise __________ times per day.  RANGE OF MOTION - Extension, Prone Press Ups   Lie on your stomach on the floor, a bed will be too soft. Place your palms about shoulder width apart and at the height of your head.  Keeping your back as relaxed as possible, slowly straighten your elbows while keeping your hips on the floor. You may adjust the placement of your hands to maximize your comfort. As you gain motion, your hands will come more underneath your shoulders.  Hold this position __________ seconds.  Slowly return to lying flat on the floor. Repeat __________ times. Complete this exercise __________ times per day.  RANGE OF MOTION- Quadruped, Neutral Spine   Assume a hands and knees position on a firm surface. Keep your hands under  your shoulders and your knees under your hips. You may place padding under your knees for comfort.  Drop your head and point your tail bone toward the ground below you. This will round out your low back like an angry cat. Hold this position for __________ seconds.  Slowly lift your head and release your tail bone so that your back sags into a large arch, like an old horse.  Hold this position for __________ seconds.  Repeat this until you feel limber in your low back.  Now, find your "sweet spot." This will be the most comfortable position somewhere between the two previous positions. This is your neutral spine. Once you have found this position, tense your stomach muscles to support your low back.  Hold this position for __________ seconds. Repeat __________ times. Complete this exercise __________ times per day.  STRETCH - Flexion, Single Knee to Chest   Lie on a firm bed or floor with both legs extended in front of you.  Keeping one leg in contact with the floor, bring your opposite knee to your chest. Hold your leg in place by either grabbing behind your thigh or at your knee.  Pull until you feel a gentle stretch in your low back. Hold __________ seconds.  Slowly release your grasp and repeat the exercise with the opposite side. Repeat __________ times. Complete this exercise __________ times per day.  STRETCH - Hamstrings, Standing  Stand or sit and extend your right / left leg, placing your foot on a chair or foot stool  Keeping  a slight arch in your low back and your hips straight forward.  Lead with your chest and lean forward at the waist until you feel a gentle stretch in the back of your right / left knee or thigh. (When done correctly, this exercise requires leaning only a small distance.)  Hold this position for __________ seconds. Repeat __________ times. Complete this stretch __________ times per day. STRENGTHENING - Deep Abdominals, Pelvic Tilt   Lie on a firm bed  or floor. Keeping your legs in front of you, bend your knees so they are both pointed toward the ceiling and your feet are flat on the floor.  Tense your lower abdominal muscles to press your low back into the floor. This motion will rotate your pelvis so that your tail bone is scooping upwards rather than pointing at your feet or into the floor.  With a gentle tension and even breathing, hold this position for __________ seconds. Repeat __________ times. Complete this exercise __________ times per day.  STRENGTHENING - Abdominals, Crunches   Lie on a firm bed or floor. Keeping your legs in front of you, bend your knees so they are both pointed toward the ceiling and your feet are flat on the floor. Cross your arms over your chest.  Slightly tip your chin down without bending your neck.  Tense your abdominals and slowly lift your trunk high enough to just clear your shoulder blades. Lifting higher can put excessive stress on the low back and does not further strengthen your abdominal muscles.  Control your return to the starting position. Repeat __________ times. Complete this exercise __________ times per day.  STRENGTHENING - Quadruped, Opposite UE/LE Lift   Assume a hands and knees position on a firm surface. Keep your hands under your shoulders and your knees under your hips. You may place padding under your knees for comfort.  Find your neutral spine and gently tense your abdominal muscles so that you can maintain this position. Your shoulders and hips should form a rectangle that is parallel with the floor and is not twisted.  Keeping your trunk steady, lift your right hand no higher than your shoulder and then your left leg no higher than your hip. Make sure you are not holding your breath. Hold this position __________ seconds.  Continuing to keep your abdominal muscles tense and your back steady, slowly return to your starting position. Repeat with the opposite arm and leg. Repeat  __________ times. Complete this exercise __________ times per day. Document Released: 06/05/2005 Document Revised: 08/10/2011 Document Reviewed: 08/30/2008 [facility] Patient Information 2015 [place], [place]. This information is not intended to replace advice given to you by your health care provider. Make sure you discuss any questions you have with your health care provider.

## 2014-09-11 NOTE — Progress Notes (Signed)
OFFICE NOTE  09/15/2014  CC:  Chief Complaint  Patient presents with  . Nausea  . Back Pain   HPI: Patient is a 44 y.o. Caucasian male who is here for about 48h of GI sx's: vomited x 1, then had no further n/v.  Diarrhea started at that time too--watery and brown, small volume, 5-10 per 24h. No abd cramping.  No fevers.  Cephalexin daily x 1 mo for acne: pt had rx from derm in the past. Hydrating well.  No antidiarrhea meds being used.  No sick contacts with similar sx's lately.  Feels malaise.  No aches except has waxing waning LB pain with R leg radicular sx's.  Recent nasal congestion with PND.  Uses saline nasal spray and it helps briefly. Takes zyrtec D daily.  No nasal steroid.  No facial pain or upper teeth pain.  No cough. No sneezing or itchy/runny eyes.  Pertinent PMH:  Past medical, surgical, social, and family history reviewed and no changes are noted since last office visit.  MEDS:  Outpatient Prescriptions Prior to Visit  Medication Sig Dispense Refill  . bisoprolol-hydrochlorothiazide (ZIAC) 5-6.25 MG per tablet Take 1 tablet by mouth daily. 30 tablet 3  . buPROPion (WELLBUTRIN XL) 300 MG 24 hr tablet TAKE ONE TABLET BY MOUTH ONE TIME DAILY  30 tablet 2  . DEXILANT 60 MG capsule Take 60 mg by mouth daily.    . rosuvastatin (CRESTOR) 40 MG tablet Take 1 tablet (40 mg total) by mouth daily. (Patient taking differently: Take 40 mg by mouth every 7 (seven) days. ) 30 tablet 11  . tamsulosin (FLOMAX) 0.4 MG CAPS capsule 1-2 tabs po qhs 60 capsule 1  . valACYclovir (VALTREX) 500 MG tablet Take 1 tablet (500 mg total) by mouth daily. 30 tablet 3  . traMADol (ULTRAM) 50 MG tablet 1-2 tabs po q6h prn severe headache (Patient taking differently: Take 50 mg by mouth every 6 (six) hours as needed for moderate pain. ) 30 tablet 1   No facility-administered medications prior to visit.    PE: Blood pressure 120/70, pulse 74, temperature 98.2 F (36.8 C), temperature source Oral,  height 5\' 11"  (1.803 m), weight 193 lb (87.544 kg), SpO2 95 %. BMI 27 Gen: Alert, well appearing.  Patient is oriented to person, place, time, and situation. BBC:WUGQ: no injection, icteris, swelling, or exudate.  EOMI, PERRLA.  Edematous nasal turbinates noted, no purulent d/c. Mouth: lips without lesion/swelling.  Oral mucosa pink and moist. Oropharynx without erythema, exudate, or swelling.  CV: RRR, no m/r/g.   LUNGS: CTA bilat, nonlabored resps, good aeration in all lung fields. Abd: soft, NT/ND BACK; ROM intact but mild pain with lateral flexion and with extension. No ischial tuberosity tenderness. Sitting SLR neg bilat.  R hip w/out TTP, ROM intact and w/out pain. LE strength 5/5 bilat prox/dist mm's   IMPRESSION AND PLAN:  1) Viral GE: watchful waiting. Stool studies if still not better in 2d.  2) Chronic LB pain with intermittent R radiculopathy pain in lateral thigh. Consider PT referral: pt will think about it. Home back stretching regimen discussed.  NSAIDs prn discussed.  3) Allergic rhinitis: Start flonase, rx for zyrtec today.  4) Overweight: BMI 27.  Pt concerned and wants to discuss/received very specific dietary recommendations: I recommended he make appt with Nicky Pugh, FNP for this.  An After Visit Summary was printed and given to the patient.  FOLLOW UP: prn

## 2014-09-17 ENCOUNTER — Ambulatory Visit (INDEPENDENT_AMBULATORY_CARE_PROVIDER_SITE_OTHER): Payer: 59 | Admitting: Nurse Practitioner

## 2014-09-17 ENCOUNTER — Other Ambulatory Visit: Payer: Self-pay

## 2014-09-17 ENCOUNTER — Encounter: Payer: Self-pay | Admitting: Nurse Practitioner

## 2014-09-17 VITALS — BP 123/83 | HR 84 | Temp 98.4°F | Ht 71.0 in | Wt 193.0 lb

## 2014-09-17 DIAGNOSIS — Z6826 Body mass index (BMI) 26.0-26.9, adult: Secondary | ICD-10-CM | POA: Insufficient documentation

## 2014-09-17 DIAGNOSIS — I10 Essential (primary) hypertension: Secondary | ICD-10-CM

## 2014-09-17 MED ORDER — BISOPROLOL-HYDROCHLOROTHIAZIDE 5-6.25 MG PO TABS: 1.0000 | ORAL_TABLET | Freq: Every day | ORAL | Status: DC

## 2014-09-17 NOTE — Telephone Encounter (Signed)
Patients ZIAC refilled per [facility] protocol

## 2014-09-17 NOTE — Progress Notes (Signed)
Pre visit review using our clinic review tool, if applicable. No additional management support is needed unless otherwise documented below in the visit note. 

## 2014-09-17 NOTE — Progress Notes (Signed)
Subjective:     Benjamin [name] is a 44 y.o. male here for discussion regarding weight loss and wants to "get off medicines". Pt is treated for HTN & dyslipidemia-high triglycerides (over 300) & low HDL (34). BMI is 26.9. He exercises once /week for about 20 minutes. He drinks a soda daily & eats "pretty much whatever I want". Eats at restaurants frequently due to business travel. Today we discussed his increased risk for heart disease & stroke; discussed foods that directly impact triglycerides; discussed impact of exercise on HDL.  The following portions of the patient's history were reviewed and updated as appropriate: allergies, current medications, past medical history, past social history, past surgical history and problem list.  Review of Systems Pertinent items are noted in HPI.    Objective:    Body mass index is 26.93 kg/(m^2). BP 123/83 mmHg  Pulse 84  Temp(Src) 98.4 F (36.9 C) (Oral)  Ht 5\' 11"  (1.803 m)  Wt 193 lb (87.544 kg)  BMI 26.93 kg/m2  SpO2 96% General appearance: alert, cooperative, appears stated age and no distress Head: Normocephalic, without obvious abnormality, atraumatic Eyes: negative findings: lids and lashes normal and conjunctivae and sclerae normal Neurologic: Grossly normal    Assessment:    Obesity. I assessed Benjamin [name] to be in an action stage with respect to weight loss.    Plan:  Goal: lose 13 lbs over 8 weeks for BMI of 25 decrease refined sugars & grains. Cut out soda. Limit meat & animal proteins to 3 to 4 times week. Increase fruits & vegetables, nuts, seeds, whole grains Increase exercise to 30 minutes 5 days week  Diet HO given Pt has option of returning in 4 weeks if not losing wt or in 3 mos to see Dr Marcille Buffy for repeat lipids, BP, wt check.  Spent 25 Minutes with patient, 50% or more was spent in counseling.

## 2014-09-17 NOTE — Patient Instructions (Signed)
Weight loss goal is 13 pounds. This will take about 2 months. If you want to count calories, limit to 2100 calories daily if you are exercising 1 to 3 times week & 250 calories if exercising 5 times weekly or more. Helpful phone app for calorie counting is "Go Meals". Consider reading Eat to Live by Excell Seltzer and begin implementing principles for best human nutrition & health.  Refer to hand out for principles & suggested menu. When you diverge from the guide, get back to healthy choices with the next meal or snack.  As discussed,  Cut out refined sugar- anything that is sweet when you eat or drink it except fresh fruit.  Cut out refined grains- bread, rolls, biscuits, bagels, muffins, pasta and cereals that have less than 4 grams fiber per serving.  Limit animal fats & proteins to 3 to 4 times/week. Consider using almond milk rather than cow' milk.  Develop lifelong habits of exercise most days of the week: take a 30 minute walk. The benefits include weight loss, lower risk for heart disease, diabetes, stroke, high blood pressure, lower rates of depression & dementia, better sleep quality & bone health.  Please return in 4 weeks if you have concerns, otherwise see Dr Anitra Lauth in 3 months for fasting labs.

## 2014-10-01 ENCOUNTER — Encounter: Payer: Self-pay | Admitting: Family Medicine

## 2014-10-01 ENCOUNTER — Ambulatory Visit (INDEPENDENT_AMBULATORY_CARE_PROVIDER_SITE_OTHER): Payer: 59 | Admitting: Family Medicine

## 2014-10-01 VITALS — BP 121/88 | HR 110 | Temp 99.4°F | Resp 16 | Wt 192.0 lb

## 2014-10-01 DIAGNOSIS — B882 Other arthropod infestations: Secondary | ICD-10-CM

## 2014-10-01 DIAGNOSIS — R509 Fever, unspecified: Secondary | ICD-10-CM | POA: Diagnosis not present

## 2014-10-01 MED ORDER — DOXYCYCLINE HYCLATE 100 MG PO TABS: 100.0000 mg | ORAL_TABLET | Freq: Two times a day (BID) | ORAL | Status: DC

## 2014-10-01 NOTE — Progress Notes (Signed)
Pre visit review using our clinic review tool, if applicable. No additional management support is needed unless otherwise documented below in the visit note. 

## 2014-10-01 NOTE — Progress Notes (Signed)
OFFICE NOTE  10/01/2014  CC:  Chief Complaint  Patient presents with  . Generalized Body Aches   HPI: Patient is a 44 y.o. Caucasian male who is here for achiness feeling that started about midnight last night, was sweating profusely all night.  Upper legs, low back, neck a bit achy/stiff.  Temp checked today at home after going home from work and it was 99.8.  He took 800 mg ibuprofen and this has helped some. Bad HA now.  Decreased appetite today.   Going through lots of stress with ex-wife lately.  No resp sx's, no n/v/d or rash.  Urine burned slightly when he went once today, urine looked concentrated.  No penile d/c, no new sexual partner in last few weeks.  Very remote hx of GC/Chlam in college. No new meds recently. Ran out of bupropion 1 week ago--has been too busy to go get his rf.  Also not on his bisop/hctz.  Son with allergies vs URI in the last week or so. No known recent tick bite.  Pertinent PMH:  Past Medical History  Diagnosis Date  . ALLERGIC RHINITIS 07/11/2008  . BACK PAIN, CHRONIC, INTERMITTENT 10/17/2008  . BENIGN PROSTATIC HYPERTROPHY, WITH URINARY OBSTRUCTION 07/21/2007  . BURSITIS, HIP 09/30/2009  . HYPERTENSION 01/05/2007  . PSORIASIS 01/05/2007  . Herpes labialis   . Hyperlipidemia   . Allergy   . GERD (gastroesophageal reflux disease)   . Anxiety   . Hiatal hernia   . CAP (community acquired pneumonia) 06/2014  . Cataracts, bilateral 2016    Not visually significant   Past Surgical History  Procedure Laterality Date  . Lipoma excision      several, arm    MEDS:  Outpatient Prescriptions Prior to Visit  Medication Sig Dispense Refill  . bisoprolol-hydrochlorothiazide (ZIAC) 5-6.25 MG per tablet Take 1 tablet by mouth daily. 30 tablet 3  . buPROPion (WELLBUTRIN XL) 300 MG 24 hr tablet TAKE ONE TABLET BY MOUTH ONE TIME DAILY  30 tablet 2  . DEXILANT 60 MG capsule Take 60 mg by mouth daily.    . fluticasone (FLONASE) 50 MCG/ACT nasal spray Place 2 sprays  into both nostrils daily. 16 g 6  . rosuvastatin (CRESTOR) 40 MG tablet Take 1 tablet (40 mg total) by mouth daily. (Patient taking differently: Take 40 mg by mouth every 7 (seven) days. ) 30 tablet 11  . tamsulosin (FLOMAX) 0.4 MG CAPS capsule 1-2 tabs po qhs 60 capsule 1  . traMADol (ULTRAM) 50 MG tablet Take 1 tablet (50 mg total) by mouth every 6 (six) hours as needed for moderate pain. 30 tablet 1  . valACYclovir (VALTREX) 500 MG tablet Take 1 tablet (500 mg total) by mouth daily. 30 tablet 3   No facility-administered medications prior to visit.    PE: Blood pressure 121/88, pulse 110, temperature 99.4 F (37.4 C), temperature source Temporal, resp. rate 16, weight 192 lb (87.091 kg), SpO2 97 %. Gen: Alert, well appearing.  Patient is oriented to person, place, time, and situation. WUJ:WJXB: no injection, icteris, swelling, or exudate.  EOMI, PERRLA. Mouth: lips without lesion/swelling.  Oral mucosa pink and moist. Oropharynx without erythema, exudate, or swelling.  Neck - No masses or thyromegaly or limitation in range of motion.  Mild diffuse TTP of paraspinous soft tissues of posterior neck, no mass/adenopathy.  NO nuchal rigidity. CV: RRR, no m/r/g.   LUNGS: CTA bilat, nonlabored resps, good aeration in all lung fields. ABD: soft, NT, ND, BS normal.  No hepatospenomegaly or mass.  No bruits. EXT: no clubbing, cyanosis, or edema.  Skin - no sores or suspicious lesions or rashes or color changes   LABS:    Chemistry      Component Value Date/Time   NA 137 06/18/2014 1200   K 3.7 06/18/2014 1200   CL 102 06/18/2014 1200   CO2 29 06/18/2014 1200   BUN 18 06/18/2014 1200   CREATININE 1.18 06/18/2014 1200   CREATININE 1.25 08/26/2012 0854      Component Value Date/Time   CALCIUM 9.6 06/18/2014 1200   ALKPHOS 65 06/18/2014 1200   AST 20 06/18/2014 1200   ALT 28 06/18/2014 1200   BILITOT 0.5 06/18/2014 1200     Lab Results  Component Value Date   WBC 8.5 06/18/2014    HGB 15.8 06/18/2014   HCT 46.3 06/18/2014   MCV 89.5 06/18/2014   PLT 426.0* 06/18/2014   Lab Results  Component Value Date   TSH 0.98 06/18/2014   Lab Results  Component Value Date   CHOL 209* 06/18/2014   HDL 34.00* 06/18/2014   LDLCALC 117* 12/12/2013   LDLDIRECT 96.0 06/18/2014   TRIG 390.0* 06/18/2014   CHOLHDL 6 06/18/2014     IMPRESSION AND PLAN:  1) Acute illness c/w viral syndrome vs tick borne disease. Will start empiric doxycycline 100 mg bid x 10d. Symptomatic care discussed. Push fluids, rest. Signs/symptoms to call or return for were reviewed and pt expressed understanding.  An After Visit Summary was printed and given to the patient.  FOLLOW UP: prn

## 2014-10-03 ENCOUNTER — Telehealth: Payer: Self-pay | Admitting: *Deleted

## 2014-10-03 NOTE — Telephone Encounter (Signed)
No new antibiotic at this time. Stop doxycycline for today and tomorrow.  Pls ask if he is having any diarrhea with this. Call Friday to report how he is feeling. Pls send in generic phenergan 12.5mg  tabs, 1-2 tabs po q6h prn, #20, RF x 1 AND generic phenergan suppositories 12.5mg , 1-2 PR q6h prn nausea if unable to keep phenergan pills down, #10, RF x 1. -thx

## 2014-10-03 NOTE — Telephone Encounter (Signed)
Pt called stating that he was seen on Monday 10/01/14 for tick bite and was given Rx for Doxy. He states that he is not tolerating the Doxy very well, states that it is causing N/V. He states that he has tried taking the Doxy with and without food but is still having N/V after taking it. He is requesting that something else be sent in or does he need to come back for ov.  He states that he is also running low grade fevers through out the day if he is not taking a fever reducer. He states that his fever has not went above 100F. He also complained of neck stiffness. Please advised. Thanks.

## 2014-10-03 NOTE — Telephone Encounter (Signed)
Pt advised and voiced understanding. He states that he has not had any diarrhea at this time. He states that he only has the n/v after he takes the doxy but will go ahead and stop for today and tomorrow and call if not improving or getting worse. He states that he does not want anything called in for nausea at this time.

## 2014-11-01 ENCOUNTER — Other Ambulatory Visit: Payer: Self-pay | Admitting: *Deleted

## 2014-11-01 MED ORDER — BUPROPION HCL ER (XL) 300 MG PO TB24: 300.0000 mg | ORAL_TABLET | Freq: Every day | ORAL | Status: DC

## 2014-11-01 NOTE — Telephone Encounter (Signed)
[facility]) requesting refill for Bupropion 300mg  take 1 tablet daily. LOV 10/01/14, up coming ov 12/18/14, last written: 07/23/14 #30 w/ 2RF. Rx sent for #30/ 3RF

## 2014-11-09 ENCOUNTER — Ambulatory Visit (INDEPENDENT_AMBULATORY_CARE_PROVIDER_SITE_OTHER): Payer: 59 | Admitting: Nurse Practitioner

## 2014-11-09 ENCOUNTER — Encounter: Payer: Self-pay | Admitting: Nurse Practitioner

## 2014-11-09 VITALS — BP 132/86 | HR 80 | Temp 98.8°F | Resp 16 | Ht 71.0 in | Wt 190.0 lb

## 2014-11-09 DIAGNOSIS — R1031 Right lower quadrant pain: Secondary | ICD-10-CM | POA: Diagnosis not present

## 2014-11-09 LAB — POCT URINALYSIS DIPSTICK
Bilirubin, UA: NEGATIVE
Glucose, UA: NEGATIVE
Ketones, UA: NEGATIVE
Leukocytes, UA: NEGATIVE
Nitrite, UA: NEGATIVE
Protein, UA: NEGATIVE
Spec Grav, UA: >=1.03
Urobilinogen, UA: 0.2
pH, UA: 5.5

## 2014-11-09 LAB — COMPREHENSIVE METABOLIC PANEL
ALT: 22 U/L (ref 0–53)
AST: 17 U/L (ref 0–37)
Albumin: 4.7 g/dL (ref 3.5–5.2)
Alkaline Phosphatase: 63 U/L (ref 39–117)
BUN: 19 mg/dL (ref 6–23)
CO2: 31 mEq/L (ref 19–32)
Calcium: 9.8 mg/dL (ref 8.4–10.5)
Chloride: 103 mEq/L (ref 96–112)
Creatinine, Ser: 1.23 mg/dL (ref 0.40–1.50)
GFR: 67.57 mL/min (ref >60.00)
Glucose, Bld: 87 mg/dL (ref 70–99)
Potassium: 4 mEq/L (ref 3.5–5.1)
Sodium: 141 mEq/L (ref 135–145)
Total Bilirubin: 0.9 mg/dL (ref 0.2–1.2)
Total Protein: 7.7 g/dL (ref 6.0–8.3)

## 2014-11-09 LAB — CBC
HCT: 45.9 % (ref 39.0–52.0)
Hemoglobin: 15.9 g/dL (ref 13.0–17.0)
MCHC: 34.5 g/dL (ref 30.0–36.0)
MCV: 89 fl (ref 78.0–100.0)
Platelets: 342 10*3/uL (ref 150.0–400.0)
RBC: 5.16 Mil/uL (ref 4.22–5.81)
RDW: 12.7 % (ref 11.5–15.5)
WBC: 15.2 10*3/uL — ABNORMAL HIGH (ref 4.0–10.5)

## 2014-11-09 LAB — AMYLASE: Amylase: 18 U/L — ABNORMAL LOW (ref 27–131)

## 2014-11-09 LAB — LIPASE: Lipase: 4 U/L — ABNORMAL LOW (ref 11.0–59.0)

## 2014-11-09 MED ORDER — METRONIDAZOLE 500 MG PO TABS: 500.0000 mg | ORAL_TABLET | Freq: Four times a day (QID) | ORAL | Status: DC

## 2014-11-09 MED ORDER — SULFAMETHOXAZOLE-TRIMETHOPRIM 800-160 MG PO TABS: 1.0000 | ORAL_TABLET | Freq: Two times a day (BID) | ORAL | Status: DC

## 2014-11-09 NOTE — Progress Notes (Signed)
Subjective:     Benjamin [name] is a 44 y.o. male who presents for evaluation of abdominal pain. Onset was 1 day ago. Symptoms have been gradually worsening. The pain is described as aching, and is 8/10 in intensity, waxes & wanes, but constant. Didn't sleep well last night due to pain. Had to stop driving today due to pain. Ate 3 hrs ago-eating does not make pain worse. Pain is located in the RLQ without radiation.  Aggravating factors: bending at waist, lifting L leg.  Alleviating factors: none. Associated symptoms: bloating. The patient denies anorexia, belching, chills, constipation, diarrhea, dysuria and nausea. He takes dexilant for GERD.   The patient's history has been marked as reviewed and updated as appropriate.  Review of Systems Pertinent items are noted in HPI.     Objective:    BP 132/86 mmHg  Pulse 80  Temp(Src) 98.8 F (37.1 C) (Temporal)  Resp 16  Ht 5\' 11"  (1.803 m)  Wt 190 lb (86.183 kg)  BMI 26.51 kg/m2  SpO2 98% General appearance: alert, cooperative, appears stated age and mild distress Head: Normocephalic, without obvious abnormality, atraumatic Eyes: negative findings: lids and lashes normal and conjunctivae and sclerae normal Lungs: clear to auscultation bilaterally Heart: regular rate and rhythm, S1, S2 normal, no murmur, click, rub or gallop Abdomen: normal findings: no masses palpable and no organomegaly and abnormal findings:  very tender RLQ Neurologic: Grossly normal    Assessment:Plan  1. RLQ abdominal pain DD: diverticulosis, kidney stone - CBC - Comprehensive metabolic panel - Lipase - Amylase - POCT urinalysis dipstick-small blood, SG 1.030 - Urine culture - sulfamethoxazole-trimethoprim (BACTRIM DS,SEPTRA DS) 800-160 MG per tablet; Take 1 tablet by mouth 2 (two) times daily.  Dispense: 14 tablet; Refill: 0 - metroNIDAZOLE (FLAGYL) 500 MG tablet; Take 1 tablet (500 mg total) by mouth 4 (four) times daily.  Dispense: 28 tablet; Refill:  0 See pt instructions F/u 1 wk

## 2014-11-09 NOTE — Patient Instructions (Signed)
I suspect diverticulitis.  Start antibiotics. DO not drink alcohol while taking metronidazole or within 24 hours of last dose.  Start liquid diet for 3 days-any beverage, soup, popsicle, ice cream, pudding, yogurt. If pain is better, advance to soft diet-scrambled egg, soft fruits berries, melon, steamed vegetables, bread, potatoes, rice.  See [place] in 1 week. If pain worse, fever, nausea over weekend, go to ER.

## 2014-11-09 NOTE — Progress Notes (Signed)
Pre visit review using our clinic review tool, if applicable. No additional management support is needed unless otherwise documented below in the visit note. 

## 2014-11-11 LAB — URINE CULTURE: Colony Count: 15000

## 2014-11-15 ENCOUNTER — Ambulatory Visit: Payer: 59 | Admitting: Nurse Practitioner

## 2014-11-19 ENCOUNTER — Ambulatory Visit: Payer: 59 | Admitting: Nurse Practitioner

## 2014-11-23 ENCOUNTER — Other Ambulatory Visit: Payer: Self-pay | Admitting: *Deleted

## 2014-11-23 DIAGNOSIS — G44029 Chronic cluster headache, not intractable: Secondary | ICD-10-CM

## 2014-11-23 MED ORDER — TRAMADOL HCL 50 MG PO TABS: 50.0000 mg | ORAL_TABLET | Freq: Four times a day (QID) | ORAL | Status: DC | PRN

## 2014-11-23 MED ORDER — TAMSULOSIN HCL 0.4 MG PO CAPS: ORAL_CAPSULE | ORAL | Status: DC

## 2014-11-23 MED ORDER — VALACYCLOVIR HCL 500 MG PO TABS: 500.0000 mg | ORAL_TABLET | Freq: Every day | ORAL | Status: DC

## 2014-11-23 NOTE — Telephone Encounter (Signed)
Dr. Anitra Lauth pt. RF request for Tamsulosin, valacyclovir, tramadol LOV: 11/09/14 acute visit Next ov: 12/18/14 Last written: 06/28/14 #60 w/ 1RF, 07/09/14 #30 w/ 3RF, 09/11/14 #30 w/ 1RF Please advise. Thanks.

## 2014-12-18 ENCOUNTER — Ambulatory Visit: Payer: 59 | Admitting: Family Medicine

## 2014-12-18 ENCOUNTER — Encounter: Payer: Self-pay | Admitting: Family Medicine

## 2015-01-28 ENCOUNTER — Other Ambulatory Visit: Payer: Self-pay | Admitting: *Deleted

## 2015-01-28 ENCOUNTER — Telehealth: Payer: Self-pay | Admitting: Family Medicine

## 2015-01-28 DIAGNOSIS — R1031 Right lower quadrant pain: Secondary | ICD-10-CM

## 2015-01-28 DIAGNOSIS — I10 Essential (primary) hypertension: Secondary | ICD-10-CM

## 2015-01-28 MED ORDER — SULFAMETHOXAZOLE-TRIMETHOPRIM 800-160 MG PO TABS: 1.0000 | ORAL_TABLET | Freq: Two times a day (BID) | ORAL | Status: DC

## 2015-01-28 MED ORDER — METRONIDAZOLE 500 MG PO TABS: 500.0000 mg | ORAL_TABLET | Freq: Four times a day (QID) | ORAL | Status: DC

## 2015-01-28 MED ORDER — BISOPROLOL-HYDROCHLOROTHIAZIDE 5-6.25 MG PO TABS: 1.0000 | ORAL_TABLET | Freq: Every day | ORAL | Status: DC

## 2015-01-28 NOTE — Telephone Encounter (Signed)
Pls ask patient where his abdominal pain is located (Right lower quadrant?  Left lower quadrant?, etc). Any diarrhea?  Any blood in stool?  Any n/v.  Any fever?--thx

## 2015-01-28 NOTE — Telephone Encounter (Signed)
Sulfamethoxazole & metronidazole. Patient no longer has insurance. [facility] [employer] [facility]

## 2015-01-28 NOTE — Telephone Encounter (Signed)
Spoke to pt and he stated that he started having LLQ pain today (01/28/15)- sharpe and tender to touch, no diarrhea, blood in stool, n/v or fever. Please advise. Thanks.

## 2015-01-28 NOTE — Telephone Encounter (Signed)
Pt advised and voiced understanding.   

## 2015-01-28 NOTE — Telephone Encounter (Signed)
OK, bactrim DS and flagyl RF eRx'd. Tell pt if not improving in 2-3 days then needs to seek medical care.-thx

## 2015-01-28 NOTE — Telephone Encounter (Signed)
Spoke to pt he stated that he is having the same symptoms he had in June when he seen Nicky Pugh, FNP and was diagnosed with diverticulosis. I advised pt that these medications are normally not refilled unless seen by provider. He stated that he does not have insurance right now and would like a message sent back to see if they could be approved without ov. Please advise. Thanks.

## 2015-01-28 NOTE — Telephone Encounter (Signed)
RF request for Bisoprolol/hctz LOV: 11/09/14 Next ov: None Last written: 09/17/14 #30 w/ 3RF

## 2015-01-31 ENCOUNTER — Ambulatory Visit (HOSPITAL_BASED_OUTPATIENT_CLINIC_OR_DEPARTMENT_OTHER)
Admission: RE | Admit: 2015-01-31 | Discharge: 2015-01-31 | Disposition: A | Discharge status: Home / Self Care | Payer: 59 | Source: Ambulatory Visit | Attending: Family Medicine | Admitting: Family Medicine

## 2015-01-31 ENCOUNTER — Encounter: Payer: Self-pay | Admitting: Family Medicine

## 2015-01-31 ENCOUNTER — Other Ambulatory Visit: Payer: Self-pay | Admitting: Family Medicine

## 2015-01-31 ENCOUNTER — Ambulatory Visit (INDEPENDENT_AMBULATORY_CARE_PROVIDER_SITE_OTHER): Payer: 59 | Admitting: Family Medicine

## 2015-01-31 VITALS — BP 124/87 | HR 87 | Temp 98.4°F | Resp 16 | Ht 71.0 in | Wt 187.0 lb

## 2015-01-31 DIAGNOSIS — K5730 Diverticulosis of large intestine without perforation or abscess without bleeding: Secondary | ICD-10-CM | POA: Diagnosis not present

## 2015-01-31 DIAGNOSIS — R1032 Left lower quadrant pain: Secondary | ICD-10-CM | POA: Diagnosis not present

## 2015-01-31 DIAGNOSIS — K5732 Diverticulitis of large intestine without perforation or abscess without bleeding: Secondary | ICD-10-CM | POA: Diagnosis not present

## 2015-01-31 DIAGNOSIS — K630 Abscess of intestine: Secondary | ICD-10-CM | POA: Insufficient documentation

## 2015-01-31 DIAGNOSIS — K529 Noninfective gastroenteritis and colitis, unspecified: Secondary | ICD-10-CM | POA: Insufficient documentation

## 2015-01-31 DIAGNOSIS — I708 Atherosclerosis of other arteries: Secondary | ICD-10-CM | POA: Insufficient documentation

## 2015-01-31 LAB — POCT URINALYSIS DIPSTICK
Bilirubin, UA: NEGATIVE
Glucose, UA: NEGATIVE
Ketones, UA: NEGATIVE
Leukocytes, UA: NEGATIVE
Nitrite, UA: NEGATIVE
Protein, UA: NEGATIVE
Spec Grav, UA: 1.01
Urobilinogen, UA: 0.2
pH, UA: 6.5

## 2015-01-31 MED ORDER — HYDROCODONE-ACETAMINOPHEN 5-325 MG PO TABS: 1.0000 | ORAL_TABLET | Freq: Four times a day (QID) | ORAL | Status: DC | PRN

## 2015-01-31 MED ORDER — LEVOFLOXACIN 750 MG PO TABS: ORAL_TABLET | ORAL | Status: DC

## 2015-01-31 MED ORDER — IOHEXOL 300 MG/ML  SOLN
100.0000 mL | Freq: Once | INTRAMUSCULAR | Status: AC | PRN
  Administered 2015-01-31: 100 mL via INTRAVENOUS

## 2015-01-31 NOTE — Progress Notes (Signed)
OFFICE VISIT  01/31/2015   CC:  Chief Complaint  Patient presents with  . Abdominal Pain    LLQ x 4-5 days   HPI:    Patient is a 44 y.o. Caucasian male who presents for abdominal pain. Onset 4 days ago in L groin area, intermittent, lmostly moderate intensity, but gets severe for 10-15 seconds at a time, seems to be worsening/more frequent.  Now also with constant lower abd pressure feeling.  The pain extends sometimes up into L side of abdomen less intensely. Lying down does not help.  No side or flank pain.   No loss of appetite, no diarrhea.  No recent constipation, no blood in stool.   Urine stream slow lately, lots of dribbling, takes longer to feel emptied.  No gross hematuria.  No dysuria. He is drinking a lot of fluids.  No fever. He denies prior known hx of kidney stone.  No pain in prostate region.  Has been on flagyl and bactrim for the last 3 days after he called our office with his complaints: these complaints matched a past episode of same sx's that had significantly improved within 2 days of getting on flagyl and bactrim--presumed acute diverticulitis.  Past Medical History  Diagnosis Date  . ALLERGIC RHINITIS 07/11/2008  . BACK PAIN, CHRONIC, INTERMITTENT 10/17/2008  . BENIGN PROSTATIC HYPERTROPHY, WITH URINARY OBSTRUCTION 07/21/2007  . BURSITIS, HIP 09/30/2009  . HYPERTENSION 01/05/2007  . PSORIASIS 01/05/2007  . Herpes labialis   . Hyperlipidemia   . Allergic rhinitis   . GERD without esophagitis   . Anxiety   . Hiatal hernia   . CAP (community acquired pneumonia) 06/2014  . Cataracts, bilateral 2016    Not visually significant  . Barrett's esophagus determined by biopsy 04/12/14  . 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 Gastric polyp 2006    Past Surgical History  Procedure Laterality Date  . Lipoma excision      several, arm  . Esophagogastroduodenoscopy  04/12/14    Barrett's esophagus    Outpatient Prescriptions Prior to Visit  Medication Sig  Dispense Refill  . bisoprolol-hydrochlorothiazide (ZIAC) 5-6.25 MG per tablet Take 1 tablet by mouth daily. 30 tablet 3  . buPROPion (WELLBUTRIN XL) 300 MG 24 hr tablet Take 1 tablet (300 mg total) by mouth daily. 30 tablet 3  . DEXILANT 60 MG capsule Take 60 mg by mouth daily.    . fluticasone (FLONASE) 50 MCG/ACT nasal spray Place 2 sprays into both nostrils daily. 16 g 6  . metroNIDAZOLE (FLAGYL) 500 MG tablet Take 1 tablet (500 mg total) by mouth 4 (four) times daily. 40 tablet 0  . rosuvastatin (CRESTOR) 40 MG tablet Take 1 tablet (40 mg total) by mouth daily. (Patient taking differently: Take 40 mg by mouth every 7 (seven) days. ) 30 tablet 11  . sulfamethoxazole-trimethoprim (BACTRIM DS,SEPTRA DS) 800-160 MG per tablet Take 1 tablet by mouth 2 (two) times daily. 20 tablet 0  . tamsulosin (FLOMAX) 0.4 MG CAPS capsule 1-2 tabs po qhs 60 capsule 0  . traMADol (ULTRAM) 50 MG tablet Take 1 tablet (50 mg total) by mouth every 6 (six) hours as needed for moderate pain. 30 tablet 1  . valACYclovir (VALTREX) 500 MG tablet Take 1 tablet (500 mg total) by mouth daily. 30 tablet 3   No facility-administered medications prior to visit.    Allergies  Allergen Reactions  . Bismuth Subsalicylate Swelling  . Penicillins Other (See Comments)  Childhood allergy    ROS As per HPI  PE: Blood pressure 124/87, pulse 87, temperature 98.4 F (36.9 C), temperature source Oral, resp. rate 16, height 5\' 11"  (1.803 m), weight 187 lb (84.823 kg), SpO2 100 %. Gen: Alert, tired-appearing but in NAD.  Patient is oriented to person, place, time, and situation. CV: RRR, no m/r/g.   LUNGS: CTA bilat, nonlabored resps, good aeration in all lung fields. ABD: soft, nondistended, BS hypoactive, ++suprapubic and LLQ TTP with voluntary guarding.  No rebound tenderness. His abdomen is more tense/firm in inferior aspect of LLQ, but no mass is palpable. Back: no CVA tenderness to palpation/percussion.  Flanks:  nontender. No skin changes in area of tenderness in LLQ.   LABS:  UA today showed trace lysed blood, otherwise normal  IMPRESSION AND PLAN:  LLQ abd pain, suspect acute diverticulitis.  Worsening despite appropriate abx for the last 3d. Will obtain CBC w/diff and CMET stat today. Will obtain CT abd/pelv with contrast to eval further for possible diverticular abscess. Vicodin 5/325, 1-2 q6h prn, #30.  An After Visit Summary was printed and given to the patient.  FOLLOW UP: To be determined based on results of pending workup.

## 2015-01-31 NOTE — Progress Notes (Signed)
Pre visit review using our clinic review tool, if applicable. No additional management support is needed unless otherwise documented below in the visit note. 

## 2015-02-01 ENCOUNTER — Other Ambulatory Visit (HOSPITAL_BASED_OUTPATIENT_CLINIC_OR_DEPARTMENT_OTHER): Payer: 59

## 2015-02-02 LAB — COMPREHENSIVE METABOLIC PANEL
ALT: 15 U/L (ref 9–46)
AST: 15 U/L (ref 10–40)
Albumin: 4.6 g/dL (ref 3.6–5.1)
Alkaline Phosphatase: 59 U/L (ref 40–115)
BUN: 16 mg/dL (ref 7–25)
CO2: 26 mmol/L (ref 20–31)
Calcium: 9.6 mg/dL (ref 8.6–10.3)
Chloride: 100 mmol/L (ref 98–110)
Creat: 1.31 mg/dL (ref 0.60–1.35)
Glucose, Bld: 89 mg/dL (ref 65–99)
Potassium: 4.3 mmol/L (ref 3.5–5.3)
Sodium: 137 mmol/L (ref 135–146)
Total Bilirubin: 0.5 mg/dL (ref 0.2–1.2)
Total Protein: 7.8 g/dL (ref 6.1–8.1)

## 2015-02-02 LAB — CBC WITH DIFFERENTIAL/PLATELET
Basophils Absolute: 0 10*3/uL (ref 0.0–0.1)
Basophils Relative: 0 % (ref 0–1)
Eosinophils Absolute: 0.3 10*3/uL (ref 0.0–0.7)
Eosinophils Relative: 2 % (ref 0–5)
HCT: 46.1 % (ref 39.0–52.0)
Hemoglobin: 15.3 g/dL (ref 13.0–17.0)
Lymphocytes Relative: 22 % (ref 12–46)
Lymphs Abs: 3.1 10*3/uL (ref 0.7–4.0)
MCH: 30.7 pg (ref 26.0–34.0)
MCHC: 33.2 g/dL (ref 30.0–36.0)
MCV: 92.6 fL (ref 78.0–100.0)
MPV: 10.4 fL (ref 8.6–12.4)
Monocytes Absolute: 1.7 10*3/uL — ABNORMAL HIGH (ref 0.1–1.0)
Monocytes Relative: 12 % (ref 3–12)
Neutro Abs: 9.1 10*3/uL — ABNORMAL HIGH (ref 1.7–7.7)
Neutrophils Relative %: 64 % (ref 43–77)
Platelets: 412 10*3/uL — ABNORMAL HIGH (ref 150–400)
RBC: 4.98 MIL/uL (ref 4.22–5.81)
RDW: 13.3 % (ref 11.5–15.5)
WBC: 14.2 10*3/uL — ABNORMAL HIGH (ref 4.0–10.5)

## 2015-02-05 ENCOUNTER — Other Ambulatory Visit: Payer: Self-pay | Admitting: *Deleted

## 2015-02-05 DIAGNOSIS — E785 Hyperlipidemia, unspecified: Secondary | ICD-10-CM

## 2015-02-05 MED ORDER — ROSUVASTATIN CALCIUM 40 MG PO TABS: 40.0000 mg | ORAL_TABLET | Freq: Every day | ORAL | Status: DC

## 2015-02-05 MED ORDER — TAMSULOSIN HCL 0.4 MG PO CAPS: ORAL_CAPSULE | ORAL | Status: DC

## 2015-02-05 MED ORDER — BUPROPION HCL ER (XL) 300 MG PO TB24: 300.0000 mg | ORAL_TABLET | Freq: Every day | ORAL | Status: DC

## 2015-02-05 MED ORDER — DEXLANSOPRAZOLE 60 MG PO CPDR: 60.0000 mg | DELAYED_RELEASE_CAPSULE | Freq: Every day | ORAL | Status: DC

## 2015-02-05 NOTE — Telephone Encounter (Signed)
Does pt need to come in for f/u? Please advise. Thanks.   LOV: 06/28/14 NOV: None  RF request for flomax Last written: 11/23/14 #60 w/ 0  RF request for dexilant Last written: 06/04/14  RF request for crestor Last written: 12/18/13 #30 w/ 11RF  RF request for wellbutrin Last written: 11/01/14 #30 w/ 3RF

## 2015-04-18 ENCOUNTER — Other Ambulatory Visit: Payer: Self-pay | Admitting: Family Medicine

## 2015-04-18 DIAGNOSIS — G44029 Chronic cluster headache, not intractable: Secondary | ICD-10-CM

## 2015-04-18 MED ORDER — TRAMADOL HCL 50 MG PO TABS: 50.0000 mg | ORAL_TABLET | Freq: Four times a day (QID) | ORAL | Status: DC | PRN

## 2015-04-18 NOTE — Telephone Encounter (Signed)
RF request for tramadol LOV: 01/31/15 Next ov: None Last written: 02/23/15 #30 w/ 1RF  Please advise. Thanks.

## 2015-04-18 NOTE — Telephone Encounter (Signed)
Pt is asking for a refill on his tramadol. His phone number is [phone]

## 2015-05-15 ENCOUNTER — Encounter: Payer: Self-pay | Admitting: Family Medicine

## 2015-05-15 ENCOUNTER — Ambulatory Visit (INDEPENDENT_AMBULATORY_CARE_PROVIDER_SITE_OTHER): Payer: 59 | Admitting: Family Medicine

## 2015-05-15 VITALS — BP 138/90 | HR 71 | Temp 97.9°F | Resp 16 | Ht 71.0 in | Wt 188.5 lb

## 2015-05-15 DIAGNOSIS — R350 Frequency of micturition: Secondary | ICD-10-CM | POA: Diagnosis not present

## 2015-05-15 DIAGNOSIS — Z23 Encounter for immunization: Secondary | ICD-10-CM

## 2015-05-15 DIAGNOSIS — N138 Other obstructive and reflux uropathy: Secondary | ICD-10-CM

## 2015-05-15 DIAGNOSIS — N401 Enlarged prostate with lower urinary tract symptoms: Secondary | ICD-10-CM | POA: Diagnosis not present

## 2015-05-15 DIAGNOSIS — R300 Dysuria: Secondary | ICD-10-CM | POA: Diagnosis not present

## 2015-05-15 LAB — POCT URINALYSIS DIPSTICK
Bilirubin, UA: NEGATIVE
Glucose, UA: NEGATIVE
Ketones, UA: NEGATIVE
Leukocytes, UA: NEGATIVE
Nitrite, UA: NEGATIVE
Protein, UA: NEGATIVE
Spec Grav, UA: 1.025
Urobilinogen, UA: 0.2
pH, UA: 5.5

## 2015-05-15 MED ORDER — TAMSULOSIN HCL 0.4 MG PO CAPS: ORAL_CAPSULE | ORAL | Status: DC

## 2015-05-15 NOTE — Progress Notes (Signed)
OFFICE VISIT  05/15/2015   CC:  Chief Complaint  Patient presents with  . Urinary Tract Infection    retention and frequency x 3-4 weeks   HPI:    Patient is a 44 y.o. Caucasian male who presents for: takes 2-3 minutes to get bladder empty in morning. Describes urinary frequency in daytime, usually goes through the night w/out having to get up and urinate.   Occ straining needed to get good urine stream.  No dysuria.   Sx's occurring chronically, but worse the last 3-4 wks.  No hx of UTI.  Was on flomax in past and this was helpful.   Takes tramadol and/or vicodin for HAs, sometimes regularly for a few weeks and sometimes none for a few weeks.   He does not notice any difference in urinary habits/sx's on these meds. Denies pain in anal/prostate region, denies penile discharge.  Past Medical History  Diagnosis Date  . ALLERGIC RHINITIS 07/11/2008  . BACK PAIN, CHRONIC, INTERMITTENT 10/17/2008  . BENIGN PROSTATIC HYPERTROPHY, WITH URINARY OBSTRUCTION 07/21/2007  . BURSITIS, HIP 09/30/2009  . HYPERTENSION 01/05/2007  . PSORIASIS 01/05/2007  . Herpes labialis   . Hyperlipidemia   . Allergic rhinitis   . GERD without esophagitis   . Anxiety   . Hiatal hernia   . CAP (community acquired pneumonia) 06/2014  . Cataracts, bilateral 2016    Not visually significant  . Barrett's esophagus determined by biopsy 04/12/14  . 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 Gastric polyp 2006  . Diverticulitis 2016    CT 01/2015    Past Surgical History  Procedure Laterality Date  . Lipoma excision      several, arm  . Esophagogastroduodenoscopy  04/12/14    Barrett's esophagus    Outpatient Prescriptions Prior to Visit  Medication Sig Dispense Refill  . bisoprolol-hydrochlorothiazide (ZIAC) 5-6.25 MG per tablet Take 1 tablet by mouth daily. 30 tablet 3  . buPROPion (WELLBUTRIN XL) 300 MG 24 hr tablet Take 1 tablet (300 mg total) by mouth daily. 30 tablet 6  . dexlansoprazole (DEXILANT) 60 MG  capsule Take 1 capsule (60 mg total) by mouth daily. 30 capsule 6  . fluticasone (FLONASE) 50 MCG/ACT nasal spray Place 2 sprays into both nostrils daily. 16 g 6  . HYDROcodone-acetaminophen (NORCO/VICODIN) 5-325 MG per tablet Take 1 tablet by mouth every 6 (six) hours as needed for moderate pain. 30 tablet 0  . rosuvastatin (CRESTOR) 40 MG tablet Take 1 tablet (40 mg total) by mouth daily. 30 tablet 6  . traMADol (ULTRAM) 50 MG tablet Take 1 tablet (50 mg total) by mouth every 6 (six) hours as needed for moderate pain. 30 tablet 1  . valACYclovir (VALTREX) 500 MG tablet Take 1 tablet (500 mg total) by mouth daily. 30 tablet 3  . levofloxacin (LEVAQUIN) 750 MG tablet 1 tab po qd x 10 days (Patient not taking: Reported on 05/15/2015) 10 tablet 0  . metroNIDAZOLE (FLAGYL) 500 MG tablet Take 1 tablet (500 mg total) by mouth 4 (four) times daily. (Patient not taking: Reported on 05/15/2015) 40 tablet 0  . tamsulosin (FLOMAX) 0.4 MG CAPS capsule 1-2 tabs po qhs (Patient not taking: Reported on 05/15/2015) 60 capsule 6   No facility-administered medications prior to visit.    Allergies  Allergen Reactions  . Bismuth Subsalicylate Swelling  . Penicillins Other (See Comments)    Childhood allergy    ROS As per HPI  PE: Blood pressure 138/90, pulse 71,  temperature 97.9 F (36.6 C), temperature source Oral, resp. rate 16, height 5\' 11"  (1.803 m), weight 188 lb 8 oz (85.503 kg), SpO2 98 %. Gen: Alert, well appearing.  Patient is oriented to person, place, time, and situation. AFFECT: pleasant, lucid thought and speech. No further exam today (prostate normal on exam with same sx's 06/2014)  LABS:  CC UA today: trace intact blood, otherwise normal  IMPRESSION AND PLAN:  BPH with lower urinary tract obstructive sx's: restart flomax and stay on this med indefinitely. CAll if not improved on max dose of 0.8mg  qhs in 1 week or so and will likely add antibiotic x 14d to see if infectious  prostatitis is playing a role.  Flu vaccine today.  An After Visit Summary was printed and given to the patient.   FOLLOW UP: Return in about 3 months (around 08/13/2015), or if symptoms worsen or fail to improve, for annual CPE (fasting).

## 2015-05-15 NOTE — Progress Notes (Signed)
Pre visit review using our clinic review tool, if applicable. No additional management support is needed unless otherwise documented below in the visit note. 

## 2015-05-17 LAB — URINE CULTURE
Colony Count: NO GROWTH
Organism ID, Bacteria: NO GROWTH

## 2015-06-01 ENCOUNTER — Other Ambulatory Visit: Payer: Self-pay | Admitting: Family Medicine

## 2015-06-04 NOTE — Telephone Encounter (Signed)
RF request for bisoprolol/hctz LOV: 05/15/15 Next ov: 08/05/15 Last written: 01/28/15 #30 w/ 3RF

## 2015-06-05 ENCOUNTER — Other Ambulatory Visit: Payer: Self-pay | Admitting: *Deleted

## 2015-06-05 MED ORDER — VALACYCLOVIR HCL 500 MG PO TABS: 500.0000 mg | ORAL_TABLET | Freq: Every day | ORAL | Status: DC

## 2015-06-05 NOTE — Telephone Encounter (Signed)
RF request for valacyclovir LOV: 05/15/15 Next ov: 08/05/15 Last written: 11/23/14 #30 w/ 3RF

## 2015-06-14 ENCOUNTER — Ambulatory Visit: Payer: Self-pay | Admitting: Family Medicine

## 2015-07-02 ENCOUNTER — Ambulatory Visit (INDEPENDENT_AMBULATORY_CARE_PROVIDER_SITE_OTHER): Payer: BLUE CROSS/BLUE SHIELD | Admitting: Family Medicine

## 2015-07-02 ENCOUNTER — Encounter: Payer: Self-pay | Admitting: Family Medicine

## 2015-07-02 VITALS — BP 116/85 | HR 85 | Temp 98.0°F | Resp 20 | Wt 190.8 lb

## 2015-07-02 DIAGNOSIS — K5732 Diverticulitis of large intestine without perforation or abscess without bleeding: Secondary | ICD-10-CM

## 2015-07-02 DIAGNOSIS — R1031 Right lower quadrant pain: Secondary | ICD-10-CM

## 2015-07-02 DIAGNOSIS — R1032 Left lower quadrant pain: Secondary | ICD-10-CM | POA: Diagnosis not present

## 2015-07-02 HISTORY — DX: Diverticulitis of large intestine without perforation or abscess without bleeding: K57.32

## 2015-07-02 LAB — CBC WITH DIFFERENTIAL/PLATELET
Basophils Absolute: 0.1 10*3/uL (ref 0.0–0.1)
Basophils Relative: 1 % (ref 0–1)
Eosinophils Absolute: 0.3 10*3/uL (ref 0.0–0.7)
Eosinophils Relative: 4 % (ref 0–5)
HCT: 45 % (ref 39.0–52.0)
Hemoglobin: 15.7 g/dL (ref 13.0–17.0)
Lymphocytes Relative: 30 % (ref 12–46)
Lymphs Abs: 2.4 10*3/uL (ref 0.7–4.0)
MCH: 31.2 pg (ref 26.0–34.0)
MCHC: 34.9 g/dL (ref 30.0–36.0)
MCV: 89.3 fL (ref 78.0–100.0)
MPV: 9.5 fL (ref 8.6–12.4)
Monocytes Absolute: 0.7 10*3/uL (ref 0.1–1.0)
Monocytes Relative: 9 % (ref 3–12)
Neutro Abs: 4.5 10*3/uL (ref 1.7–7.7)
Neutrophils Relative %: 56 % (ref 43–77)
Platelets: 381 10*3/uL (ref 150–400)
RBC: 5.04 MIL/uL (ref 4.22–5.81)
RDW: 13 % (ref 11.5–15.5)
WBC: 8.1 10*3/uL (ref 4.0–10.5)

## 2015-07-02 MED ORDER — LEVOFLOXACIN 750 MG PO TABS: ORAL_TABLET | ORAL | Status: DC

## 2015-07-02 MED ORDER — HYDROCODONE-ACETAMINOPHEN 5-325 MG PO TABS: 1.0000 | ORAL_TABLET | Freq: Four times a day (QID) | ORAL | Status: DC | PRN

## 2015-07-02 MED ORDER — METRONIDAZOLE 500 MG PO TABS: 500.0000 mg | ORAL_TABLET | Freq: Four times a day (QID) | ORAL | Status: DC

## 2015-07-02 NOTE — Patient Instructions (Signed)
Diverticulosis Diverticulosis is the condition that develops when small pouches (diverticula) form in the wall of your colon. Your colon, or large intestine, is where water is absorbed and stool is formed. The pouches form when the inside layer of your colon pushes through weak spots in the outer layers of your colon. CAUSES  No one knows exactly what causes diverticulosis. RISK FACTORS  Being older than 69. Your risk for this condition increases with age. Diverticulosis is rare in people younger than 40 years. By age 71, almost everyone has it.  Eating a low-fiber diet.  Being frequently constipated.  Being overweight.  Not getting enough exercise.  Smoking.  Taking over-the-counter pain medicines, like aspirin and ibuprofen. SYMPTOMS  Most people with diverticulosis do not have symptoms. DIAGNOSIS  Because diverticulosis often has no symptoms, health care providers often discover the condition during an exam for other colon problems. In many cases, a health care provider will diagnose diverticulosis while using a flexible scope to examine the colon (colonoscopy). TREATMENT  If you have never developed an infection related to diverticulosis, you may not need treatment. If you have had an infection before, treatment may include:  Eating more fruits, vegetables, and grains.  Taking a fiber supplement.  Taking a live bacteria supplement (probiotic).  Taking medicine to relax your colon. HOME CARE INSTRUCTIONS   Drink at least 6-8 glasses of water each day to prevent constipation.  Try not to strain when you have a bowel movement.  Keep all follow-up appointments. If you have had an infection before:  Increase the fiber in your diet as directed by your health care provider or dietitian.  Take a dietary fiber supplement if your health care provider approves.  Only take medicines as directed by your health care provider. SEEK MEDICAL CARE IF:   You have abdominal  pain.  You have bloating.  You have cramps.  You have not gone to the bathroom in 3 days. SEEK IMMEDIATE MEDICAL CARE IF:   Your pain gets worse.  Yourbloating becomes very bad.  You have a fever or chills, and your symptoms suddenly get worse.  You begin vomiting.  You have bowel movements that are bloody or black. MAKE SURE YOU:  Understand these instructions.  Will watch your condition.  Will get help right away if you are not doing well or get worse.   This information is not intended to replace advice given to you by your health care provider. Make sure you discuss any questions you have with your health care provider.   Document Released: 02/13/2004 Document Revised: 05/23/2013 Document Reviewed: 04/12/2013 Elsevier Interactive Patient Education 2016 [employer].   Diverticulitis Diverticulitis is inflammation or infection of small pouches in your colon that form when you have a condition called diverticulosis. The pouches in your colon are called diverticula. Your colon, or large intestine, is where water is absorbed and stool is formed. Complications of diverticulitis can include:  Bleeding.  Severe infection.  Severe pain.  Perforation of your colon.  Obstruction of your colon. CAUSES  Diverticulitis is caused by bacteria. Diverticulitis happens when stool becomes trapped in diverticula. This allows bacteria to grow in the diverticula, which can lead to inflammation and infection. RISK FACTORS People with diverticulosis are at risk for diverticulitis. Eating a diet that does not include enough fiber from fruits and vegetables may make diverticulitis more likely to develop. SYMPTOMS  Symptoms of diverticulitis may include:  Abdominal pain and tenderness. The pain is normally located on  the left side of the abdomen, but may occur in other areas.  Fever and chills.  Bloating.  Cramping.  Nausea.  Vomiting.  Constipation.  Diarrhea.  Blood  in your stool. DIAGNOSIS  Your health care provider will ask you about your medical history and do a physical exam. You may need to have tests done because many medical conditions can cause the same symptoms as diverticulitis. Tests may include:  Blood tests.  Urine tests.  Imaging tests of the abdomen, including X-rays and CT scans. When your condition is under control, your health care provider may recommend that you have a colonoscopy. A colonoscopy can show how severe your diverticula are and whether something else is causing your symptoms. TREATMENT  Most cases of diverticulitis are mild and can be treated at home. Treatment may include:  Taking over-the-counter pain medicines.  Following a clear liquid diet.  Taking antibiotic medicines by mouth for 7-10 days. More severe cases may be treated at a hospital. Treatment may include:  Not eating or drinking.  Taking prescription pain medicine.  Receiving antibiotic medicines through an IV tube.  Receiving fluids and nutrition through an IV tube.  Surgery. HOME CARE INSTRUCTIONS   Follow your health care provider's instructions carefully.  Follow a full liquid diet or other diet as directed by your health care provider. After your symptoms improve, your health care provider may tell you to change your diet. He or she may recommend you eat a high-fiber diet. Fruits and vegetables are good sources of fiber. Fiber makes it easier to pass stool.  Take fiber supplements or probiotics as directed by your health care provider.  Only take medicines as directed by your health care provider.  Keep all your follow-up appointments. SEEK MEDICAL CARE IF:   Your pain does not improve.  You have a hard time eating food.  Your bowel movements do not return to normal. SEEK IMMEDIATE MEDICAL CARE IF:   Your pain becomes worse.  Your symptoms do not get better.  Your symptoms suddenly get worse.  You have a fever.  You have  repeated vomiting.  You have bloody or black, tarry stools. MAKE SURE YOU:   Understand these instructions.  Will watch your condition.  Will get help right away if you are not doing well or get worse.   This information is not intended to replace advice given to you by your health care provider. Make sure you discuss any questions you have with your health care provider.   Document Released: 02/25/2005 Document Revised: 05/23/2013 Document Reviewed: 04/12/2013 Elsevier Interactive Patient Education [employer].   If no improvement you would likely need to go to ED for a scan.

## 2015-07-02 NOTE — Progress Notes (Signed)
Patient ID: Benjamin [name], male   DOB: [birth year], 45 y.o.   MRN: [id number] OFFICE VISIT  07/02/2015   CC:  Chief Complaint  Patient presents with  . Abdominal Pain    LLQ pain Hx diverticulitis   HPI:    Patient is a 45 y.o. Caucasian male who presents for LLQ abdominal pain with bloating of 5 days in duration. Patient has a recent history approximately 4-5 months ago in which she was diagnosed with diverticulosis with a similar presentation. At that time patient responded to Levaquin and Flagyl, and received a CT scan of his abdomen. CT results in September 2016 showed severe inflammation involving the proximal sigmoid colon with wall thickening, wiith diverticula noticed in the descending colon. Patient was noted to have an intramural abscess involving the proximal sigmoid colon without evidence of perforation. Patient received complete resolution of symptoms and with above treatment, and narcotics for pain control.  Currently patient denies fever, chills, vomit, constipation, diarrhea, dysuria or any rectal bleeding. Patient reports his last bowel movement was this morning and normal. He states the pain is a constant pain is moderately intense ache that is made worse by laying flat. It is improved by walking or sitting in a 45. Constant pain, worse with laying flat, propping up or walking better. Patient reports he is eating and drinking well, although he is experiencing some mild nausea. He has been taking Tylenol or Advil for the pain, which she states is not helping. His last dose of Tylenol was this morning. Patient states the only thing he has noticed that maybe trigger his red meat. She does not follow a special diet.  Past Medical History  Diagnosis Date  . ALLERGIC RHINITIS 07/11/2008  . BACK PAIN, CHRONIC, INTERMITTENT 10/17/2008  . BENIGN PROSTATIC HYPERTROPHY, WITH URINARY OBSTRUCTION 07/21/2007  . BURSITIS, HIP 09/30/2009  . HYPERTENSION 01/05/2007  . PSORIASIS 01/05/2007  . Herpes  labialis   . Hyperlipidemia   . Allergic rhinitis   . GERD without esophagitis   . Anxiety   . Hiatal hernia   . CAP (community acquired pneumonia) 06/2014  . Cataracts, bilateral 2016    Not visually significant  . Barrett's esophagus determined by biopsy 04/12/14  . 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 Gastric polyp 2006  . Diverticulitis 2016    CT 01/2015    Past Surgical History  Procedure Laterality Date  . Lipoma excision      several, arm  . Esophagogastroduodenoscopy  04/12/14    Barrett's esophagus    Outpatient Prescriptions Prior to Visit  Medication Sig Dispense Refill  . bisoprolol-hydrochlorothiazide (ZIAC) 5-6.25 MG tablet TAKE 1 TABLET BY MOUTH DAILY 30 tablet 11  . buPROPion (WELLBUTRIN XL) 300 MG 24 hr tablet Take 1 tablet (300 mg total) by mouth daily. 30 tablet 6  . dexlansoprazole (DEXILANT) 60 MG capsule Take 1 capsule (60 mg total) by mouth daily. 30 capsule 6  . fluticasone (FLONASE) 50 MCG/ACT nasal spray Place 2 sprays into both nostrils daily. 16 g 6  . rosuvastatin (CRESTOR) 40 MG tablet Take 1 tablet (40 mg total) by mouth daily. 30 tablet 6  . tamsulosin (FLOMAX) 0.4 MG CAPS capsule 1-2 tabs po qhs 60 capsule 12  . valACYclovir (VALTREX) 500 MG tablet Take 1 tablet (500 mg total) by mouth daily. 30 tablet 3  . HYDROcodone-acetaminophen (NORCO/VICODIN) 5-325 MG per tablet Take 1 tablet by mouth every 6 (six) hours as needed for moderate pain. (Patient  not taking: Reported on 07/02/2015) 30 tablet 0  . traMADol (ULTRAM) 50 MG tablet Take 1 tablet (50 mg total) by mouth every 6 (six) hours as needed for moderate pain. 30 tablet 1   No facility-administered medications prior to visit.    Allergies  Allergen Reactions  . Bismuth Subsalicylate Swelling  . Penicillins Other (See Comments)    Childhood allergy    ROS As per HPI  PE: Blood pressure 116/85, pulse 85, temperature 98 F (36.7 C), resp. rate 20, weight 190 lb 12 oz (86.524 kg),  SpO2 96 %. Gen: Alert, tired-appearing but in NAD.  Patient is oriented to person, place, time, and situation. CV: RRR, no m/r/g.   LUNGS: CTA bilat, nonlabored resps, good aeration in all lung fields. ABD: soft, nondistended, BS hypoactive, ++LLQ TTP with deep palpation. No rebound tenderness. Mild guarding. No palpable masses. Negative peritoneal signs/negative heel tap. No hepatosplenomegaly. Skin: Intact. Warm and well perfused. No skin changes. No rashes, petechia or purpura.   LABS:  CBC ordered today  IMPRESSION AND PLAN: LLQ abd pain, suspect acute diverticulitis. CBC collected today. Patient was educated on emergent signs and symptoms of diverticulitis. AVS education was provided to patient. Patient was encouraged that if pain worsens, he develops a temperature, he sees bowel changes/rectal bleeding, unable to tolerate food/liquid/medications he is to be seen immediately in the emergency room, as he would likely need CT scan to rule out perforation/abscess. - We'll attempt outpatient treatment with Levaquin/Flagyl and Vicodin 5-3 25 for pain control. Patient was advised to be cautious for constipation related to narcotic use. - patient was encouraged to follow-up with one week if symptoms have not completely resolved, or go to the emergency room if any of the above emergent symptoms develop.  An After Visit Summary was printed and given to the patient.

## 2015-07-03 ENCOUNTER — Telehealth: Payer: Self-pay | Admitting: Family Medicine

## 2015-07-03 ENCOUNTER — Ambulatory Visit: Payer: Self-pay | Admitting: Family Medicine

## 2015-07-03 NOTE — Telephone Encounter (Signed)
Left message with lab results on patient voice mail 

## 2015-07-03 NOTE — Telephone Encounter (Signed)
Please call patient: His blood work from yesterday is normal.

## 2015-07-26 ENCOUNTER — Encounter: Payer: Self-pay | Admitting: Family Medicine

## 2015-07-26 ENCOUNTER — Ambulatory Visit (INDEPENDENT_AMBULATORY_CARE_PROVIDER_SITE_OTHER): Payer: BLUE CROSS/BLUE SHIELD | Admitting: Family Medicine

## 2015-07-26 VITALS — BP 125/88 | HR 68 | Temp 98.1°F | Resp 16 | Ht 68.75 in | Wt 189.5 lb

## 2015-07-26 DIAGNOSIS — Z0000 Encounter for general adult medical examination without abnormal findings: Secondary | ICD-10-CM | POA: Diagnosis not present

## 2015-07-26 LAB — TSH: TSH: 1.18 u[IU]/mL (ref 0.35–4.50)

## 2015-07-26 LAB — LDL CHOLESTEROL, DIRECT: Direct LDL: 108 mg/dL

## 2015-07-26 LAB — COMPREHENSIVE METABOLIC PANEL
ALT: 26 U/L (ref 0–53)
AST: 20 U/L (ref 0–37)
Albumin: 4.9 g/dL (ref 3.5–5.2)
Alkaline Phosphatase: 51 U/L (ref 39–117)
BUN: 16 mg/dL (ref 6–23)
CO2: 30 mEq/L (ref 19–32)
Calcium: 10 mg/dL (ref 8.4–10.5)
Chloride: 103 mEq/L (ref 96–112)
Creatinine, Ser: 1.32 mg/dL (ref 0.40–1.50)
GFR: 62.09 mL/min (ref >60.00)
Glucose, Bld: 94 mg/dL (ref 70–99)
Potassium: 4.2 mEq/L (ref 3.5–5.1)
Sodium: 141 mEq/L (ref 135–145)
Total Bilirubin: 0.8 mg/dL (ref 0.2–1.2)
Total Protein: 8 g/dL (ref 6.0–8.3)

## 2015-07-26 LAB — LIPID PANEL
Cholesterol: 217 mg/dL — ABNORMAL HIGH (ref 0–200)
HDL: 42.1 mg/dL (ref >39.00)
NonHDL: 174.74
Total CHOL/HDL Ratio: 5
Triglycerides: 309 mg/dL — ABNORMAL HIGH (ref 0.0–149.0)
VLDL: 61.8 mg/dL — ABNORMAL HIGH (ref 0.0–40.0)

## 2015-07-26 MED ORDER — PANTOPRAZOLE SODIUM 40 MG PO TBEC: 40.0000 mg | DELAYED_RELEASE_TABLET | Freq: Every day | ORAL | Status: DC

## 2015-07-26 MED ORDER — TAMSULOSIN HCL 0.4 MG PO CAPS: ORAL_CAPSULE | ORAL | Status: DC

## 2015-07-26 MED ORDER — VALACYCLOVIR HCL 500 MG PO TABS: 500.0000 mg | ORAL_TABLET | Freq: Every day | ORAL | Status: DC

## 2015-07-26 NOTE — Progress Notes (Signed)
Pre visit review using our clinic review tool, if applicable. No additional management support is needed unless otherwise documented below in the visit note. 

## 2015-07-26 NOTE — Progress Notes (Signed)
Office Note 07/26/2015  CC:  Chief Complaint  Patient presents with  . Annual Exam    Pt is fasting.     HPI:  Benjamin [name] is a 45 y.o. White male who is here for annual health maintenance exam. Says the flomax 0.4mg  qhs has been helpful for his BPH sx's.  Dexilant too costly, asks for another PPI to try.  No acute complaints.   Past Medical History  Diagnosis Date  . ALLERGIC RHINITIS 07/11/2008  . BACK PAIN, CHRONIC, INTERMITTENT 10/17/2008  . BENIGN PROSTATIC HYPERTROPHY, WITH URINARY OBSTRUCTION 07/21/2007  . BURSITIS, HIP 09/30/2009  . HYPERTENSION 01/05/2007  . PSORIASIS 01/05/2007  . Herpes labialis   . Hyperlipidemia   . Allergic rhinitis   . GERD without esophagitis   . Anxiety   . Hiatal hernia   . CAP (community acquired pneumonia) 06/2014  . Cataracts, bilateral 2016    Not visually significant  . Barrett's esophagus determined by biopsy 04/12/14  . 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 Gastric polyp 2006  . Diverticulitis 2016    CT 01/2015    Past Surgical History  Procedure Laterality Date  . Lipoma excision      several, arm  . Esophagogastroduodenoscopy  04/12/14    Barrett's esophagus    Family History  Problem Relation Age of Onset  . Colon cancer      neg hx  . Prostate cancer      neg hx  . CAD Father     around age 30  . Heart disease Father   . Hyperlipidemia Father   . Hypertension Father   . CAD Paternal Grandmother   . Hypertension Mother   . Hypertension Brother   . Hyperlipidemia Brother   . Lung cancer Maternal Grandmother   . Cancer Maternal Grandfather     type unknown  . Diabetes Paternal Grandmother     Social History   Social History  . Marital Status: Married    Spouse Name: N/A  . Number of Children: 2  . Years of Education: N/A   Occupational History  . account manager [employer]   Social History Main Topics  . Smoking status: Never Smoker   . Smokeless tobacco: Never Used  .  Alcohol Use: 1.2 oz/week    2 Cans of beer per week  . Drug Use: No  . Sexual Activity: Yes    Birth Control/ Protection: None   Other Topics Concern  . Not on file   Social History Narrative   Currently separated as of 01/2014, 2 children.  One younger brother.   Occupation: Press photographer for [employer].   No tobacco.   Occ alcohol (1-2 x/month).  No hx of alc/drug prob.   Exercise: runs about 2 times a week.   Diet: regular american diet.             Outpatient Prescriptions Prior to Visit  Medication Sig Dispense Refill  . bisoprolol-hydrochlorothiazide (ZIAC) 5-6.25 MG tablet TAKE 1 TABLET BY MOUTH DAILY 30 tablet 11  . buPROPion (WELLBUTRIN XL) 300 MG 24 hr tablet Take 1 tablet (300 mg total) by mouth daily. 30 tablet 6  . fluticasone (FLONASE) 50 MCG/ACT nasal spray Place 2 sprays into both nostrils daily. 16 g 6  . HYDROcodone-acetaminophen (NORCO/VICODIN) 5-325 MG tablet Take 1 tablet by mouth every 6 (six) hours as needed for moderate pain. 30 tablet 0  . rosuvastatin (CRESTOR) 40 MG tablet  Take 1 tablet (40 mg total) by mouth daily. 30 tablet 6  . tamsulosin (FLOMAX) 0.4 MG CAPS capsule 1-2 tabs po qhs 60 capsule 12  . valACYclovir (VALTREX) 500 MG tablet Take 1 tablet (500 mg total) by mouth daily. 30 tablet 3  . dexlansoprazole (DEXILANT) 60 MG capsule Take 1 capsule (60 mg total) by mouth daily. (Patient not taking: Reported on 07/26/2015) 30 capsule 6  . levofloxacin (LEVAQUIN) 750 MG tablet 1 tab po qd x 10 days (Patient not taking: Reported on 07/26/2015) 10 tablet 0  . metroNIDAZOLE (FLAGYL) 500 MG tablet Take 1 tablet (500 mg total) by mouth 4 (four) times daily. (Patient not taking: Reported on 07/26/2015) 28 tablet 0   No facility-administered medications prior to visit.    Allergies  Allergen Reactions  . Bismuth Subsalicylate Swelling  . Penicillins Other (See Comments)    Childhood allergy    ROS Review of Systems  Constitutional: Negative for  fever, chills, appetite change and fatigue.  HENT: Negative for congestion, dental problem, ear pain and sore throat.   Eyes: Negative for discharge, redness and visual disturbance.  Respiratory: Negative for cough, chest tightness, shortness of breath and wheezing.   Cardiovascular: Negative for chest pain, palpitations and leg swelling.  Gastrointestinal: Negative for nausea, vomiting, abdominal pain, diarrhea and blood in stool.  Genitourinary: Negative for dysuria, urgency, frequency, hematuria, flank pain and difficulty urinating.  Musculoskeletal: Negative for myalgias, back pain, joint swelling, arthralgias and neck stiffness.  Skin: Negative for pallor and rash.  Neurological: Negative for dizziness, speech difficulty, weakness and headaches.  Hematological: Negative for adenopathy. Does not bruise/bleed easily.  Psychiatric/Behavioral: Negative for confusion and sleep disturbance. The patient is not nervous/anxious.     PE; Blood pressure 125/88, pulse 68, temperature 98.1 F (36.7 C), temperature source Oral, resp. rate 16, height 5' 8.75" (1.746 m), weight 189 lb 8 oz (85.957 kg), SpO2 96 %. Gen: Alert, well appearing.  Patient is oriented to person, place, time, and situation. AFFECT: pleasant, lucid thought and speech. ENT: Ears: EACs clear, normal epithelium.  TMs with good light reflex and landmarks bilaterally.  Eyes: no injection, icteris, swelling, or exudate.  EOMI, PERRLA. Nose: no drainage or turbinate edema/swelling.  No injection or focal lesion.  Mouth: lips without lesion/swelling.  Oral mucosa pink and moist.  Dentition intact and without obvious caries or gingival swelling.  Oropharynx without erythema, exudate, or swelling.  Neck: supple/nontender.  No LAD, mass, or TM.  Carotid pulses 2+ bilaterally, without bruits. CV: RRR, no m/r/g.   LUNGS: CTA bilat, nonlabored resps, good aeration in all lung fields. ABD: soft, NT, ND, BS normal.  No hepatospenomegaly or  mass.  No bruits. EXT: no clubbing, cyanosis, or edema.  Musculoskeletal: no joint swelling, erythema, warmth, or tenderness.  ROM of all joints intact. Skin - no sores or suspicious lesions or rashes or color changes  Pertinent labs:  Lab Results  Component Value Date   TSH 0.98 06/18/2014   Lab Results  Component Value Date   WBC 8.1 07/02/2015   HGB 15.7 07/02/2015   HCT 45.0 07/02/2015   MCV 89.3 07/02/2015   PLT 381 07/02/2015   Lab Results  Component Value Date   CREATININE 1.31 01/31/2015   BUN 16 01/31/2015   NA 137 01/31/2015   K 4.3 01/31/2015   CL 100 01/31/2015   CO2 26 01/31/2015   Lab Results  Component Value Date   ALT 15 01/31/2015   AST 15  01/31/2015   ALKPHOS 59 01/31/2015   BILITOT 0.5 01/31/2015   Lab Results  Component Value Date   CHOL 209* 06/18/2014   Lab Results  Component Value Date   HDL 34.00* 06/18/2014   Lab Results  Component Value Date   LDLCALC 117* 12/12/2013   Lab Results  Component Value Date   TRIG 390.0* 06/18/2014   Lab Results  Component Value Date   CHOLHDL 6 06/18/2014   Lab Results  Component Value Date   PSA 0.46 06/18/2014    ASSESSMENT AND PLAN:   Health maintenance exam:  Reviewed age and gender appropriate health maintenance issues (prudent diet, regular exercise, health risks of tobacco and excessive alcohol, use of seatbelts, fire alarms in home, use of sunscreen).  Also reviewed age and gender appropriate health screening as well as vaccine recommendations. Vaccines UTD.  He declines HIV screening. Fasting HP labs drawn today (except for CBC, which was recently done 06/2015 and was normal). We'll try pantoprazole 40mg  once daily to see if this is more cost effective than dexilant.  An After Visit Summary was printed and given to the patient.   FOLLOW UP:  Return in about 6 months (around 01/23/2016) for routine chronic illness f/u.

## 2015-07-28 ENCOUNTER — Encounter: Payer: Self-pay | Admitting: Family Medicine

## 2015-08-02 ENCOUNTER — Telehealth: Payer: Self-pay | Admitting: Family Medicine

## 2015-08-02 DIAGNOSIS — G44029 Chronic cluster headache, not intractable: Secondary | ICD-10-CM

## 2015-08-02 MED ORDER — TRAMADOL HCL 50 MG PO TABS: 50.0000 mg | ORAL_TABLET | Freq: Four times a day (QID) | ORAL | Status: DC | PRN

## 2015-08-02 NOTE — Telephone Encounter (Signed)
Tramadol refilled.

## 2015-08-02 NOTE — Telephone Encounter (Signed)
Pt asking for a refill on tramadol, pt states that this was not renewed when he came in for CPE, I made pt aware that Dr. Anitra Lauth was out of the office until Thursday. He asked if Dr, Raoul Pitch would call it in. Please advise pt of decision.

## 2015-08-02 NOTE — Telephone Encounter (Signed)
RF request for tramadol LOV: 07/26/15 Next ov: None Last written: 04/18/15 #30 w/ 1RF  This medication is no longer active on pts medication list.   Please advise. Thanks.

## 2015-08-05 ENCOUNTER — Encounter: Payer: 59 | Admitting: Family Medicine

## 2015-08-06 ENCOUNTER — Encounter: Payer: Self-pay | Admitting: Family Medicine

## 2015-08-06 ENCOUNTER — Ambulatory Visit (INDEPENDENT_AMBULATORY_CARE_PROVIDER_SITE_OTHER): Payer: BLUE CROSS/BLUE SHIELD | Admitting: Family Medicine

## 2015-08-06 VITALS — BP 107/75 | HR 78 | Temp 97.4°F | Resp 20 | Wt 188.0 lb

## 2015-08-06 DIAGNOSIS — R110 Nausea: Secondary | ICD-10-CM | POA: Diagnosis not present

## 2015-08-06 DIAGNOSIS — R1031 Right lower quadrant pain: Secondary | ICD-10-CM

## 2015-08-06 DIAGNOSIS — R1032 Left lower quadrant pain: Secondary | ICD-10-CM | POA: Insufficient documentation

## 2015-08-06 LAB — CBC WITH DIFFERENTIAL/PLATELET
Basophils Absolute: 0 10*3/uL (ref 0.0–0.1)
Basophils Relative: 0 % (ref 0–1)
Eosinophils Absolute: 0.2 10*3/uL (ref 0.0–0.7)
Eosinophils Relative: 2 % (ref 0–5)
HCT: 43.2 % (ref 39.0–52.0)
Hemoglobin: 15.2 g/dL (ref 13.0–17.0)
Lymphocytes Relative: 17 % (ref 12–46)
Lymphs Abs: 1.8 10*3/uL (ref 0.7–4.0)
MCH: 30.8 pg (ref 26.0–34.0)
MCHC: 35.2 g/dL (ref 30.0–36.0)
MCV: 87.4 fL (ref 78.0–100.0)
MPV: 9.6 fL (ref 8.6–12.4)
Monocytes Absolute: 0.8 10*3/uL (ref 0.1–1.0)
Monocytes Relative: 8 % (ref 3–12)
Neutro Abs: 7.7 10*3/uL (ref 1.7–7.7)
Neutrophils Relative %: 73 % (ref 43–77)
Platelets: 341 10*3/uL (ref 150–400)
RBC: 4.94 MIL/uL (ref 4.22–5.81)
RDW: 12.5 % (ref 11.5–15.5)
WBC: 10.5 10*3/uL (ref 4.0–10.5)

## 2015-08-06 LAB — C-REACTIVE PROTEIN: CRP: 12.4 mg/dL — ABNORMAL HIGH (ref <0.60)

## 2015-08-06 MED ORDER — LEVOFLOXACIN 750 MG PO TABS
ORAL_TABLET | ORAL | Status: DC
Start: 2015-08-06 — End: 2015-09-02

## 2015-08-06 MED ORDER — ALIGN 4 MG PO CAPS: ORAL_CAPSULE | ORAL | Status: DC

## 2015-08-06 MED ORDER — METRONIDAZOLE 500 MG PO TABS: 500.0000 mg | ORAL_TABLET | Freq: Four times a day (QID) | ORAL | Status: DC

## 2015-08-06 MED ORDER — ONDANSETRON HCL 4 MG PO TABS: 4.0000 mg | ORAL_TABLET | Freq: Three times a day (TID) | ORAL | Status: DC | PRN

## 2015-08-06 MED ORDER — HYDROCODONE-ACETAMINOPHEN 5-325 MG PO TABS: 1.0000 | ORAL_TABLET | Freq: Four times a day (QID) | ORAL | Status: DC | PRN

## 2015-08-06 NOTE — Patient Instructions (Signed)
I am placing a referral for your GI doctor to investigate repeat LLQ pain.  I have called in medications and align probiotic (please take for 8 weeks). And increase in pain, fever etc, report to ED immediately.

## 2015-08-06 NOTE — Progress Notes (Signed)
Patient ID: Benjamin [name], male   DOB: 16-Feb-1971, 45 y.o.   MRN: [id number]    Benjamin [name] , 01/01/1971, 45 y.o., male MRN: [id number]  CC: LLQ pain Subjective: Pt presents for an acute OV with complaints of LLQ pain of 3 days  duration. Associated symptoms include nausea, diarrhea, chills, decreased appetite. He states he woke up in a cold sweat on Sunday night, otherwise no fever or chills.  Pt feels symptoms are worse with eating. Pt has tried to eat a liquid/soft diet  to ease their symptoms. He has tramadol, but that does not seem to cover his pain.  Diarrhea, loose stool, nomelana or hematocheza. Patient has had 2 similar episodes in a 5 mos period (this is the 3rd). CT scan in 01/2015. Report below. Patient has no family history of colon cancer he is aware of, he does have MGF with a cancer of unknown type. He has responded to abx in the past, with complete resolution.   CT 01/2015:   "Severe wall thickening involving the proximal sigmoid colon, with moderate wall thickening involving the remainder of the sigmoid colon, associated with severe edema/inflammation in the fat adjacent to the proximal sigmoid colon. Approximate 1.2 x 1.2 x 0.8 cm fluid collection within the wall of the proximal sigmoid colon in the area of marked wall thickening and severe inflammation. No extraluminal gas. Diverticulum involving the mid descending colon without adjacent inflammation. Remainder of the colon unremarkable. Normal appendix in the right upper pelvis."    Allergies  Allergen Reactions  . Bismuth Subsalicylate Swelling  . Penicillins Other (See Comments)    Childhood allergy   Social History  Substance Use Topics  . Smoking status: Never Smoker   . Smokeless tobacco: Never Used  . Alcohol Use: 1.2 oz/week    2 Cans of beer per week   Past Medical History  Diagnosis Date  . ALLERGIC RHINITIS 07/11/2008  . BACK PAIN, CHRONIC, INTERMITTENT 10/17/2008  . BENIGN PROSTATIC  HYPERTROPHY, WITH URINARY OBSTRUCTION 07/21/2007  . BURSITIS, HIP 09/30/2009  . HYPERTENSION 01/05/2007  . PSORIASIS 01/05/2007  . Herpes labialis   . Hyperlipidemia   . Allergic rhinitis   . GERD without esophagitis   . Anxiety   . Hiatal hernia   . CAP (community acquired pneumonia) 06/2014  . Cataracts, bilateral 2016    Not visually significant  . Barrett's esophagus determined by biopsy 04/12/14  . 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 Gastric polyp 2006  . Diverticulitis 2016    CT 01/2015  . Chronic renal insufficiency, stage II (mild)     GFR 60s-70s   Past Surgical History  Procedure Laterality Date  . Lipoma excision      several, arm  . Esophagogastroduodenoscopy  04/12/14    Barrett's esophagus   Family History  Problem Relation Age of Onset  . Colon cancer      neg hx  . Prostate cancer      neg hx  . CAD Father     around age 79  . Heart disease Father   . Hyperlipidemia Father   . Hypertension Father   . CAD Paternal Grandmother   . Hypertension Mother   . Hypertension Brother   . Hyperlipidemia Brother   . Lung cancer Maternal Grandmother   . Cancer Maternal Grandfather     type unknown  . Diabetes Paternal Grandmother      Medication List       This  list is accurate as of: 08/06/15  2:24 PM.  Always use your most recent med list.               bisoprolol-hydrochlorothiazide 5-6.25 MG tablet  Commonly known as:  ZIAC  TAKE 1 TABLET BY MOUTH DAILY     buPROPion 300 MG 24 hr tablet  Commonly known as:  WELLBUTRIN XL  Take 1 tablet (300 mg total) by mouth daily.     fluticasone 50 MCG/ACT nasal spray  Commonly known as:  FLONASE  Place 2 sprays into both nostrils daily.     HYDROcodone-acetaminophen 5-325 MG tablet  Commonly known as:  NORCO/VICODIN  Take 1 tablet by mouth every 6 (six) hours as needed for moderate pain.     pantoprazole 40 MG tablet  Commonly known as:  PROTONIX  Take 1 tablet (40 mg total) by mouth daily.      rosuvastatin 40 MG tablet  Commonly known as:  CRESTOR  Take 1 tablet (40 mg total) by mouth daily.     tamsulosin 0.4 MG Caps capsule  Commonly known as:  FLOMAX  1 tab po qd     traMADol 50 MG tablet  Commonly known as:  ULTRAM  Take 1 tablet (50 mg total) by mouth every 6 (six) hours as needed for moderate pain.     valACYclovir 500 MG tablet  Commonly known as:  VALTREX  Take 1 tablet (500 mg total) by mouth daily.         ROS: Negative, with the exception of above mentioned in HPI   Objective:  BP 107/75 mmHg  Pulse 78  Temp(Src) 97.4 F (36.3 C)  Resp 20  Wt 188 lb (85.276 kg)  SpO2 98% Body mass index is 27.97 kg/(m^2). Gen: Afebrile. No acute distress. Nontoxic in appearance, well developed, well nourished, male.  HENT: AT. [employer]. MMM, no oral lesions.  Eyes:Pupils Equal Round Reactive to light, Extraocular movements intact,  Conjunctiva without redness, discharge or icterus. CV: RRR Chest: CTAB, no wheeze or crackles. Good air movement, normal resp effort.  Abd: Soft. flat. ND. Moderately TTPdeep  LLQ. BS hypoactive. No Masses palpated. No rebound or guarding.  Skin: No rashes, purpura or petechiae.  Neuro: Normal gait. PERLA. EOMi. Alert. Oriented x3  Assessment/Plan: Benjamin [name] is a 45 y.o. male present for acute OV for  1. LLQ pain - Deep LLQ pain on exam, otherwise normal. - HYDROcodone-acetaminophen (NORCO/VICODIN) 5-325 MG tablet; Take 1 tablet by mouth every 6 (six) hours as needed for moderate pain.  Dispense: 30 tablet; Refill: 0 - CBC w/Diff - C-reactive protein - levofloxacin (LEVAQUIN) 750 MG tablet; 1 tab po qd x 10 days  Dispense: 10 tablet; Refill: 0 - metroNIDAZOLE (FLAGYL) 500 MG tablet; Take 1 tablet (500 mg total) by mouth 4 (four) times daily.  Dispense: 40 tablet; Refill: 0 - Start Probiotic Product (ALIGN) 4 MG CAPS; 1 cap daily  Dispense: 30 capsule; Refill: 1 - Ambulatory referral to Gastroenterology--> discussed pt should have GI  referral and consider colonoscopy considering repeat symptoms x3 in 5 months.  - F/u 1 week if no improvement, discussed  Red flags --> ED  electronically signed by:  Howard Pouch, DO  [place]

## 2015-08-07 ENCOUNTER — Telehealth: Payer: Self-pay | Admitting: Family Medicine

## 2015-08-07 NOTE — Telephone Encounter (Signed)
Please call pt about labs: - it does not appear he has too much of an elevated WBC.  - His CRP (inflammatory marker) is extremely high. Suggesting inflammatory cause of pain. I want him to follow up with GI ASAP on this matter (referral placed) and if his symptoms do not improve before he is able to see them,we  may want to repeat his scan.

## 2015-08-07 NOTE — Telephone Encounter (Signed)
Left message for patient to return call to review lab resultsl

## 2015-08-09 ENCOUNTER — Encounter: Payer: Self-pay | Admitting: Gastroenterology

## 2015-08-09 ENCOUNTER — Ambulatory Visit (INDEPENDENT_AMBULATORY_CARE_PROVIDER_SITE_OTHER): Payer: BLUE CROSS/BLUE SHIELD | Admitting: Gastroenterology

## 2015-08-09 VITALS — BP 100/80 | HR 88 | Ht 68.5 in | Wt 187.4 lb

## 2015-08-09 DIAGNOSIS — R1032 Left lower quadrant pain: Secondary | ICD-10-CM | POA: Diagnosis not present

## 2015-08-09 DIAGNOSIS — Z8719 Personal history of other diseases of the digestive system: Secondary | ICD-10-CM | POA: Diagnosis not present

## 2015-08-09 MED ORDER — NA SULFATE-K SULFATE-MG SULF 17.5-3.13-1.6 GM/177ML PO SOLN: 1.0000 | Freq: Once | ORAL | Status: AC

## 2015-08-09 NOTE — Progress Notes (Signed)
08/09/2015 Benjamin [name] [id number] [birth year]   HISTORY OF PRESENT ILLNESS:  This is a 45 year old male who is known to Dr. Fuller Plan for treatment of GERD/Barrett's esophagus.  Last EGD 11/015.  He is being referred here by his PCP, Dr. Raoul Pitch, on this occasion for LLQ abdominal pain.  He's had recurrent episodes of left lower quadrant abdominal pain since September 2016. This is now the third episode. CT scan in September showed suspicion for diverticulitis also with a 1.2 cm intramural abscess. This resolved with outpatient management and oral antibiotics. Had another episode of presumed diverticulitis that was successfully treated as well. Now presents with a third episode. He is already improving on Levaquin 750 mg daily and Flagyl 500 mg four times daily after 3 days.  He says that his symptoms resolved completely between episodes.  This time he had some diarrhea and chills, which have resolved at this point.  He denies any chronic complaints of diarrhea, rectal bleeding, etc.  CBC, CMP, TSH were within normal limits recently. CRP was elevated at 12.4.   Past Medical History  Diagnosis Date  . ALLERGIC RHINITIS 07/11/2008  . BACK PAIN, CHRONIC, INTERMITTENT 10/17/2008  . BENIGN PROSTATIC HYPERTROPHY, WITH URINARY OBSTRUCTION 07/21/2007  . BURSITIS, HIP 09/30/2009  . HYPERTENSION 01/05/2007  . PSORIASIS 01/05/2007  . Herpes labialis   . Hyperlipidemia   . Allergic rhinitis   . GERD without esophagitis   . Anxiety   . Hiatal hernia   . CAP (community acquired pneumonia) 06/2014  . Cataracts, bilateral 2016    Not visually significant  . Barrett's esophagus determined by biopsy 04/12/14  . 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 Gastric polyp 2006  . Diverticulitis 2016    CT 01/2015  . Chronic renal insufficiency, stage II (mild)     GFR 60s-70s   Past Surgical History  Procedure Laterality Date  . Lipoma excision      several, arm  . Esophagogastroduodenoscopy  04/12/14   Barrett's esophagus    reports that he has never smoked. He has never used smokeless tobacco. He reports that he drinks about 1.2 oz of alcohol per week. He reports that he does not use illicit drugs. family history includes CAD in his father and paternal grandmother; Cancer in his maternal grandfather; Diabetes in his paternal grandmother; Heart disease in his father; Hyperlipidemia in his brother and father; Hypertension in his brother, father, and mother; Lung cancer in his maternal grandmother. Allergies  Allergen Reactions  . Bismuth Subsalicylate Swelling  . Penicillins Other (See Comments)    Childhood allergy  . Pepto-Bismol [Bismuth Subsalicylate] Swelling      Outpatient Encounter Prescriptions as of 08/09/2015  Medication Sig  . bisoprolol-hydrochlorothiazide (ZIAC) 5-6.25 MG tablet TAKE 1 TABLET BY MOUTH DAILY  . buPROPion (WELLBUTRIN XL) 300 MG 24 hr tablet Take 1 tablet (300 mg total) by mouth daily.  . fluticasone (FLONASE) 50 MCG/ACT nasal spray Place 2 sprays into both nostrils daily.  Marland Kitchen HYDROcodone-acetaminophen (NORCO/VICODIN) 5-325 MG tablet Take 1 tablet by mouth every 6 (six) hours as needed for moderate pain.  Marland Kitchen levofloxacin (LEVAQUIN) 750 MG tablet 1 tab po qd x 10 days  . metroNIDAZOLE (FLAGYL) 500 MG tablet Take 1 tablet (500 mg total) by mouth 4 (four) times daily.  . ondansetron (ZOFRAN) 4 MG tablet Take 1 tablet (4 mg total) by mouth every 8 (eight) hours as needed for nausea or vomiting.  . pantoprazole (PROTONIX) 40 MG  tablet Take 1 tablet (40 mg total) by mouth daily.  . Probiotic Product (ALIGN) 4 MG CAPS 1 cap daily  . rosuvastatin (CRESTOR) 40 MG tablet Take 1 tablet (40 mg total) by mouth daily.  . tamsulosin (FLOMAX) 0.4 MG CAPS capsule 1 tab po qd  . traMADol (ULTRAM) 50 MG tablet Take 1 tablet (50 mg total) by mouth every 6 (six) hours as needed for moderate pain.  . valACYclovir (VALTREX) 500 MG tablet Take 1 tablet (500 mg total) by mouth daily.  .  Na Sulfate-K Sulfate-Mg Sulf SOLN Take 1 kit by mouth once.   No facility-administered encounter medications on file as of 08/09/2015.     REVIEW OF SYSTEMS  : All other systems reviewed and negative except where noted in the History of Present Illness.   PHYSICAL EXAM: BP 100/80 mmHg  Pulse 88  Ht 5' 8.5" (1.74 m)  Wt 187 lb 6 oz (84.993 kg)  BMI 28.07 kg/m2 General: Well developed white male in no acute distress Head: Normocephalic and atraumatic Eyes:  Sclerae anicteric, conjunctiva pink. Ears: Normal auditory acuity Lungs: Clear throughout to auscultation Heart: Regular rate and rhythm Abdomen: Soft, non-distended.  BS present.  Mild LLQ TTP. Rectal:  Will be done at the time of colonoscopy. Musculoskeletal: Symmetrical with no gross deformities  Skin: No lesions on visible extremities Extremities: No edema  Neurological: Alert oriented x 4, grossly non-focal Psychological:  Alert and cooperative. Normal mood and affect  ASSESSMENT AND PLAN: -45 year old male with recurrent episodes of left lower quadrant abdominal pain since September 2016. This is now the third episode. CT scan in September showed suspicion for diverticulitis also with a 1.2 cm intramural abscess. This resolved with outpatient management and oral antibiotics. Had another episode of presumed diverticulitis that was successfully treated as well. Now presents with a third episode. He is already improving on Levaquin and Flagyl and he will complete the ten-day course of those antibiotics. We will schedule him for a colonoscopy about 5-7 weeks out from now to further evaluate. If there are no other findings in this is recurrent diverticulitis then if he continues to have more episodes especially short period of time then he may be a candidate for surgery. Also rule out colitis, malignancy, etc, however, he does not have chronic symptoms of diarrhea and rectal bleeding or other alarm/red flag symptoms.   CC:  [place],  Adrian Blackwater, MD

## 2015-08-09 NOTE — Patient Instructions (Signed)
Complete anticoitics.  Call us back if symptoms return or worsen.   You have been scheduled for a colonoscopy. Please follow written instructions given to you at your visit today.  Please pick up your prep supplies at the pharmacy within the next 1-3 days. [facility] [employer], [address]  If you use inhalers (even only as needed), please bring them with you on the day of your procedure. Your physician has requested that you go to www.startemmi.com and enter the access code given to you at your visit today. This web site gives a general overview about your procedure. However, you should still follow specific instructions given to you by our office regarding your preparation for the procedure.

## 2015-08-13 NOTE — Progress Notes (Signed)
Reviewed and agree with management plan.  Shianne Zeiser T. D[place] Lozito, MD FACG 

## 2015-08-26 ENCOUNTER — Other Ambulatory Visit: Payer: Self-pay | Admitting: *Deleted

## 2015-08-26 MED ORDER — BUPROPION HCL ER (XL) 300 MG PO TB24: 300.0000 mg | ORAL_TABLET | Freq: Every day | ORAL | Status: DC

## 2015-08-26 MED ORDER — BISOPROLOL-HYDROCHLOROTHIAZIDE 5-6.25 MG PO TABS: 1.0000 | ORAL_TABLET | Freq: Every day | ORAL | Status: DC

## 2015-08-26 NOTE — Telephone Encounter (Signed)
LOV: 07/26/15 NOV: None  RF request for bisoprolol/hctz - requesting 90 day supply Last written: 06/04/15 #30 w/ [id number]  RF request for buprpion - requesting 90 day supply Last written: 02/05/15 #30 w/ 6RF

## 2015-09-02 ENCOUNTER — Ambulatory Visit (INDEPENDENT_AMBULATORY_CARE_PROVIDER_SITE_OTHER): Payer: BLUE CROSS/BLUE SHIELD | Admitting: Family Medicine

## 2015-09-02 ENCOUNTER — Encounter: Payer: Self-pay | Admitting: Family Medicine

## 2015-09-02 VITALS — BP 128/89 | HR 75 | Temp 97.9°F | Resp 20 | Wt 191.0 lb

## 2015-09-02 DIAGNOSIS — N401 Enlarged prostate with lower urinary tract symptoms: Secondary | ICD-10-CM | POA: Diagnosis not present

## 2015-09-02 DIAGNOSIS — R350 Frequency of micturition: Secondary | ICD-10-CM | POA: Insufficient documentation

## 2015-09-02 DIAGNOSIS — N138 Other obstructive and reflux uropathy: Secondary | ICD-10-CM

## 2015-09-02 DIAGNOSIS — R21 Rash and other nonspecific skin eruption: Secondary | ICD-10-CM | POA: Insufficient documentation

## 2015-09-02 DIAGNOSIS — R319 Hematuria, unspecified: Secondary | ICD-10-CM | POA: Diagnosis not present

## 2015-09-02 LAB — POCT URINALYSIS DIPSTICK
Bilirubin, UA: NEGATIVE
Glucose, UA: NEGATIVE
Ketones, UA: NEGATIVE
Leukocytes, UA: NEGATIVE
Nitrite, UA: NEGATIVE
Protein, UA: NEGATIVE
Spec Grav, UA: >=1.03
Urobilinogen, UA: 0.2
pH, UA: 5.5

## 2015-09-02 LAB — URINALYSIS, ROUTINE W REFLEX MICROSCOPIC
Bilirubin Urine: NEGATIVE
Ketones, ur: NEGATIVE
Leukocytes, UA: NEGATIVE
Nitrite: NEGATIVE
Specific Gravity, Urine: 1.025 (ref 1.000–1.030)
Total Protein, Urine: NEGATIVE
Urine Glucose: NEGATIVE
Urobilinogen, UA: 0.2 (ref 0.0–1.0)
pH: 5.5 (ref 5.0–8.0)

## 2015-09-02 MED ORDER — TRIAMCINOLONE ACETONIDE 0.1 % EX CREA: 1.0000 "application " | TOPICAL_CREAM | Freq: Two times a day (BID) | CUTANEOUS | Status: DC

## 2015-09-02 MED ORDER — METHYLPREDNISOLONE ACETATE 80 MG/ML IJ SUSP
80.0000 mg | Freq: Once | INTRAMUSCULAR | Status: AC
  Administered 2015-09-02: 80 mg via INTRAMUSCULAR

## 2015-09-02 NOTE — Progress Notes (Signed)
Patient ID: Benjamin [name], male   DOB: 1970-09-21, 45 y.o.   MRN: [id number]    Benjamin [name] , 03-11-1971, 45 y.o., male MRN: [id number]  CC: RASH Subjective: Pt presents for an acute OV with complaints of rash of <1 week duration and urinary frequency without dysuria.  Associated symptoms include night sweats for 2 nights, waking up drenched. Pt states the rash on his neck presented and has been unchanged since presentation a few days ago. It is not spreading. He has never had anything like it before. He states it burns, and is pruritic. Pt denies change in body washes, soaps, shampoos, detergents, after shave, etc. He does not use a dry cleaner or starch necklines. Patient denies chills, diarrhea, fatigue,  sore throat, headache and he is eating and drinking well. He does not believe he has been exposed to  Outdoor causes such as insects or plants. Ni insect bites he is aware of. He has been on the golf [place] only as outside activity. He states the Urinary frequency started about the same time. He denies burning with urination, color change of urine or kidney stones. He is on  flomax 0.4 mg and admits to missing a few doses between refills over this time. He has had frequent diverticular flares over the last few months. denies ABD pain today.   Allergies  Allergen Reactions  . Bismuth Subsalicylate Swelling  . Penicillins Other (See Comments)    Childhood allergy  . Pepto-Bismol [Bismuth Subsalicylate] Swelling   Social History  Substance Use Topics  . Smoking status: Never Smoker   . Smokeless tobacco: Never Used  . Alcohol Use: 1.2 oz/week    2 Cans of beer per week   Past Medical History  Diagnosis Date  . ALLERGIC RHINITIS 07/11/2008  . BACK PAIN, CHRONIC, INTERMITTENT 10/17/2008  . BENIGN PROSTATIC HYPERTROPHY, WITH URINARY OBSTRUCTION 07/21/2007  . BURSITIS, HIP 09/30/2009  . HYPERTENSION 01/05/2007  . PSORIASIS 01/05/2007  . Herpes labialis   . Hyperlipidemia   . Allergic  rhinitis   . GERD without esophagitis   . Anxiety   . Hiatal hernia   . CAP (community acquired pneumonia) 06/2014  . Cataracts, bilateral 2016    Not visually significant  . Barrett's esophagus determined by biopsy 04/12/14  . 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 Gastric polyp 2006  . Diverticulitis 2016    CT 01/2015  . Chronic renal insufficiency, stage II (mild)     GFR 60s-70s   Past Surgical History  Procedure Laterality Date  . Lipoma excision      several, arm  . Esophagogastroduodenoscopy  04/12/14    Barrett's esophagus   Family History  Problem Relation Age of Onset  . Colon cancer      neg hx  . Prostate cancer      neg hx  . CAD Father     around age 46  . Heart disease Father   . Hyperlipidemia Father   . Hypertension Father   . CAD Paternal Grandmother   . Hypertension Mother   . Hypertension Brother   . Hyperlipidemia Brother   . Lung cancer Maternal Grandmother   . Cancer Maternal Grandfather     type unknown  . Diabetes Paternal Grandmother      Medication List       This list is accurate as of: 09/02/15  2:07 PM.  Always use your most recent med list.  ALIGN 4 MG Caps  1 cap daily     bisoprolol-hydrochlorothiazide 5-6.25 MG tablet  Commonly known as:  ZIAC  Take 1 tablet by mouth daily.     buPROPion 300 MG 24 hr tablet  Commonly known as:  WELLBUTRIN XL  Take 1 tablet (300 mg total) by mouth daily.     fluticasone 50 MCG/ACT nasal spray  Commonly known as:  FLONASE  Place 2 sprays into both nostrils daily.     HYDROcodone-acetaminophen 5-325 MG tablet  Commonly known as:  NORCO/VICODIN  Take 1 tablet by mouth every 6 (six) hours as needed for moderate pain.     Na Sulfate-K Sulfate-Mg Sulf Soln  Take 1 kit by mouth once.     ondansetron 4 MG tablet  Commonly known as:  ZOFRAN  Take 1 tablet (4 mg total) by mouth every 8 (eight) hours as needed for nausea or vomiting.     pantoprazole 40 MG tablet    Commonly known as:  PROTONIX  Take 1 tablet (40 mg total) by mouth daily.     rosuvastatin 40 MG tablet  Commonly known as:  CRESTOR  Take 1 tablet (40 mg total) by mouth daily.     tamsulosin 0.4 MG Caps capsule  Commonly known as:  FLOMAX  1 tab po qd     traMADol 50 MG tablet  Commonly known as:  ULTRAM  Take 1 tablet (50 mg total) by mouth every 6 (six) hours as needed for moderate pain.     valACYclovir 500 MG tablet  Commonly known as:  VALTREX  Take 1 tablet (500 mg total) by mouth daily.       ROS: Negative, with the exception of above mentioned in HPI  Objective:  BP 128/89 mmHg  Pulse 75  Temp(Src) 97.9 F (36.6 C) (Oral)  Resp 20  Wt 191 lb (86.637 kg)  SpO2 95% Body mass index is 28.62 kg/(m^2). Gen: Afebrile. No acute distress. nontoxic in appearance. Well developed well nourished. Pleasant male.  HENT: AT. [place].  MMM, no oral lesions.  Eyes:Pupils Equal Round Reactive to light, Extraocular movements intact,  Conjunctiva without redness, discharge or icterus. Abd: Soft.  NTND. BS present MSK: No CVA tenderness.  Skin: No purpura or petechiae. Erythema, maculopapular rash bilateral sides of neck. No vesicles. No drainage.  Neuro: Normal gait. PERLA. EOMi. Alert. Oriented x3   Assessment/Plan: Benjamin [name] is a 45 y.o. male present for acute OV for  1. Urinary frequency/hematuria (small)/H/O BPH: - Possibly secondary to missed doses of flomax.  - PSA 06/2014 normal (0.46) - POCT Urinalysis Dipstick: small blood, otherwise normal.  - will send for micro analysis  - If symptoms worsen or do not improve with restarting flomax daily will need to see back for further evaluation.   2. Rash and nonspecific skin eruption - Appears irritated/allergic or exposure by exam.  - IM depo medrol today, topical treatment for 1-2 weeks.  - triamcinolone cream (KENALOG) 0.1 %; Apply 1 application topically 2 (two) times daily.  Dispense: 30 g; Refill: 0 -  methylPREDNISolone acetate (DEPO-MEDROL) injection 80 mg; Inject 1 mL (80 mg total) into the muscle once. - If not resolved or worsening will need to follow up within 2 weeks.    electronically signed by:  Howard Pouch, DO  [place]

## 2015-09-02 NOTE — Patient Instructions (Addendum)
Rash: IM depo medrol, topical steroid use for 1-2 weeks. If not improved will need to send to dermatology. Urinary changes: likely secondary to your flomax missed doses. If not improved, or worsening will need to see you back. Urine today did n not appear infected. We will send it for microanalysis to be certain.   Urinary Frequency The number of times a normal person urinates depends upon how much liquid they take in and how much liquid they are losing. If the temperature is hot and there is high humidity, then the person will sweat more and usually breathe a little more frequently. These factors decrease the amount of frequency of urination that would be considered normal. The amount you drink is easily determined, but the amount of fluid lost is sometimes more difficult to calculate.  Fluid is lost in two ways:  Sensible fluid loss is usually measured by the amount of urine that you get rid of. Losses of fluid can also occur with diarrhea.  Insensible fluid loss is more difficult to measure. It is caused by evaporation. Insensible loss of fluid occurs through breathing and sweating. It usually ranges from a little less than a quart to a little more than a quart of fluid a day. In normal temperatures and activity levels, the average person may urinate 4 to 7 times in a 24-hour period. Needing to urinate more often than that could indicate a problem. If one urinates 4 to 7 times in 24 hours and has large volumes each time, that could indicate a different problem from one who urinates 4 to 7 times a day and has small volumes. The time of urinating is also important. Most urinating should be done during the waking hours. Getting up at night to urinate frequently can indicate some problems. CAUSES  The bladder is the organ in your lower abdomen that holds urine. Like a balloon, it swells some as it fills up. Your nerves sense this and tell you it is time to head for the bathroom. There are a number of  reasons that you might feel the need to urinate more often than usual. They include:  Urinary tract infection. This is usually associated with other signs such as burning when you urinate.  In men, problems with the prostate (a walnut-size gland that is located near the tube that carries urine out of your body). There are two reasons why the prostate can cause an increased frequency of urination:  An enlarged prostate that does not let the bladder empty well. If the bladder only half empties when you urinate, then it only has half the capacity to fill before you have to urinate again.  The nerves in the bladder become more hypersensitive with an increased size of the prostate even if the bladder empties completely.  Pregnancy.  Obesity. Excess weight is more likely to cause a problem for women than for men.  Bladder stones or other bladder problems.  Caffeine.  Alcohol.  Medications. For example, drugs that help the body get rid of extra fluid (diuretics) increase urine production. Some other medicines must be taken with lots of fluids.  Muscle or nerve weakness. This might be the result of a spinal cord injury, a stroke, multiple sclerosis, or Parkinson disease.  Long-standing diabetes can decrease the sensation of the bladder. This loss of sensation makes it harder to sense the bladder needs to be emptied. Over a period of years, the bladder is stretched out by constant overfilling. This weakens the bladder  muscles so that the bladder does not empty well and has less capacity to fill with new urine.  Interstitial cystitis (also called painful bladder syndrome). This condition develops because the tissues that line the inside of the bladder are inflamed (inflammation is the body's way of reacting to injury or infection). It causes pain and frequent urination. It occurs in women more often than in men. DIAGNOSIS   To decide what might be causing your urinary frequency, your health care  provider will probably:  Ask about symptoms you have noticed.  Ask about your overall health. This will include questions about any medications you are taking.  Do a physical examination.  Order some tests. These might include:  A blood test to check for diabetes or other health issues that could be contributing to the problem.  Urine testing. This could measure the flow of urine and the pressure on the bladder.  A test of your neurological system (the brain, spinal cord, and nerves). This is the system that senses the need to urinate.  A bladder test to check whether it is emptying completely when you urinate.  Cystoscopy. This test uses a thin tube with a tiny camera on it. It offers a look inside your urethra and bladder to see if there are problems.  Imaging tests. You might be given a contrast dye and then asked to urinate. X-rays are taken to see how your bladder is working. TREATMENT  It is important for you to be evaluated to determine if the amount or frequency that you have is unusual or abnormal. If it is found to be abnormal, the cause should be determined and this can usually be found out easily. Depending upon the cause, treatment could include medication, stimulation of the nerves, or surgery. There are not too many things that you can do as an individual to change your urinary frequency. It is important that you balance the amount of fluid intake needed to compensate for your activity and the temperature. Medical problems will be diagnosed and taken care of by your physician. There is no particular bladder training such as Kegel exercises that you can do to help urinary frequency. This is an exercise that is usually recommended for people who have leaking of urine when they laugh, cough, or sneeze. HOME CARE INSTRUCTIONS   Take any medications your health care provider prescribed or suggested. Follow the directions carefully.  Practice any lifestyle changes that are  recommended. These might include:  Drinking less fluid or drinking at different times of the day. If you need to urinate often during the night, for example, you may need to stop drinking fluids early in the evening.  Cutting down on caffeine or alcohol. They both can make you need to urinate more often than normal. Caffeine is found in coffee, tea, and sodas.  Losing weight, if that is recommended.  Keep a journal or a log. You might be asked to record how much you drink and when and where you feel the need to urinate. This will also help evaluate how well the treatment provided by your physician is working. SEEK MEDICAL CARE IF:   Your need to urinate often gets worse.  You feel increased pain or irritation when you urinate.  You notice blood in your urine.  You have questions about any medications that your health care provider recommended.  You notice blood, pus, or swelling at the site of any test or treatment procedure.  You develop a fever of  more than 100.25F (38.1C). SEEK IMMEDIATE MEDICAL CARE IF:  You develop a fever of more than 102.19F (38.9C).   This information is not intended to replace advice given to you by your health care provider. Make sure you discuss any questions you have with your health care provider.   Document Released: 03/14/2009 Document Revised: 06/08/2014 Document Reviewed: 03/14/2009 Elsevier Interactive Patient Education [employer].

## 2015-09-03 ENCOUNTER — Telehealth: Payer: Self-pay | Admitting: Family Medicine

## 2015-09-03 NOTE — Telephone Encounter (Signed)
Please call pt: - his urine studies only showed a trace of blood in his urine, this is usually not a concern. There is no signs of infection.  - He is to continue the flomax, if still having symptoms after 1 week could place urology referral for him (he could just call in and I would place the referral for him).

## 2015-09-03 NOTE — Telephone Encounter (Signed)
Left message on patient cell voice mail with results and instruction.

## 2015-09-12 ENCOUNTER — Other Ambulatory Visit: Payer: Self-pay | Admitting: *Deleted

## 2015-09-12 MED ORDER — TAMSULOSIN HCL 0.4 MG PO CAPS: ORAL_CAPSULE | ORAL | Status: DC

## 2015-09-12 MED ORDER — PANTOPRAZOLE SODIUM 40 MG PO TBEC: 40.0000 mg | DELAYED_RELEASE_TABLET | Freq: Every day | ORAL | Status: DC

## 2015-09-12 NOTE — Telephone Encounter (Signed)
LOV: 07/26/15 NOV: None  RF request for tamsulosin - requesting 90 day supply Last written: 07/26/15 #30 w/ 12RF  RF request for pantoprazole - requesting 90 day supply Last written: 07/26/15 #30 w/ 11RF

## 2015-09-24 ENCOUNTER — Ambulatory Visit: Payer: BLUE CROSS/BLUE SHIELD | Admitting: Gastroenterology

## 2015-10-04 ENCOUNTER — Encounter: Payer: Self-pay | Admitting: Gastroenterology

## 2015-10-04 ENCOUNTER — Ambulatory Visit (AMBULATORY_SURGERY_CENTER): Payer: BLUE CROSS/BLUE SHIELD | Admitting: Gastroenterology

## 2015-10-04 VITALS — BP 110/80 | HR 73 | Temp 98.4°F | Resp 14 | Ht 68.0 in | Wt 187.0 lb

## 2015-10-04 DIAGNOSIS — R933 Abnormal findings on diagnostic imaging of other parts of digestive tract: Secondary | ICD-10-CM | POA: Diagnosis present

## 2015-10-04 DIAGNOSIS — K5732 Diverticulitis of large intestine without perforation or abscess without bleeding: Secondary | ICD-10-CM

## 2015-10-04 DIAGNOSIS — D120 Benign neoplasm of cecum: Secondary | ICD-10-CM

## 2015-10-04 DIAGNOSIS — D126 Benign neoplasm of colon, unspecified: Secondary | ICD-10-CM

## 2015-10-04 DIAGNOSIS — R1032 Left lower quadrant pain: Secondary | ICD-10-CM | POA: Diagnosis not present

## 2015-10-04 HISTORY — PX: COLONOSCOPY W/ POLYPECTOMY: SHX1380

## 2015-10-04 HISTORY — DX: Benign neoplasm of colon, unspecified: D12.6

## 2015-10-04 MED ORDER — SODIUM CHLORIDE 0.9 % IV SOLN: 500.0000 mL | INTRAVENOUS | Status: DC

## 2015-10-04 MED ORDER — HYOSCYAMINE SULFATE 0.125 MG SL SUBL: 0.1250 mg | SUBLINGUAL_TABLET | SUBLINGUAL | Status: DC | PRN

## 2015-10-04 NOTE — Op Note (Signed)
[facility] Patient Name: Benjamin [name] Procedure Date: 10/04/2015 10:33 AM MRN: [id number] Endoscopist: Ladene Artist , MD Age: 45 Date of Birth: July 15, 1970 Gender: Male Procedure:                Colonoscopy Indications:              Evaluation on imaging study of clinically                            significant abnormality, Abnormal CT of the GI tract Medicines:                Monitored Anesthesia Care Procedure:                Pre-Anesthesia Assessment:                           - Prior to the procedure, a History and Physical                            was performed, and patient medications and                            allergies were reviewed. The patient's tolerance of                            previous anesthesia was also reviewed. The risks                            and benefits of the procedure and the sedation                            options and risks were discussed with the patient.                            All questions were answered, and informed consent                            was obtained. Prior Anticoagulants: The patient has                            taken no previous anticoagulant or antiplatelet                            agents. ASA Grade Assessment: II - A patient with                            mild systemic disease. After reviewing the risks                            and benefits, the patient was deemed in                            satisfactory condition to undergo the procedure.  After obtaining informed consent, the colonoscope                            was passed under direct vision. Throughout the                            procedure, the patient's blood pressure, pulse, and                            oxygen saturations were monitored continuously. The                            Model PCF-H190L [id number]) scope was introduced                            through the anus and advanced to the the cecum,                             identified by appendiceal orifice and ileocecal                            valve. The colonoscopy was performed without                            difficulty. The patient tolerated the procedure                            well. The quality of the bowel preparation was                            excellent. The ileocecal valve, appendiceal                            orifice, and rectum were photographed. Scope In: 10:42:34 AM Scope Out: 10:55:06 AM Scope Withdrawal Time: 0 hours 10 minutes 57 seconds  Total Procedure Duration: 0 hours 12 minutes 32 seconds  Findings:                 The digital rectal exam was normal.                           A 7 mm polyp was found in the cecum. The polyp was                            sessile. The polyp was removed with a cold snare.                            Resection and retrieval were complete.                           Many small and medium-mouthed diverticula were                            found in the sigmoid colon and descending colon.  There was narrowing of the colon in association                            with the diverticular opening. There was evidence                            of diverticular spasm. There was no evidence of                            diverticular bleeding.                           The exam was otherwise normal throughout the                            examined colon.                           The retroflexed view of the distal rectum and anal                            verge was normal and showed no anal or rectal                            abnormalities. Complications:            No immediate complications. Estimated Blood Loss:     Estimated blood loss: none. Impression:               - One 7 mm polyp in the cecum, removed with a cold                            snare. Resected and retrieved.                           - Moderate diverticulosis in the sigmoid colon and                             in the descending colon. Recommendation:           - Patient has a contact number available for                            emergencies. The signs and symptoms of potential                            delayed complications were discussed with the                            patient. Return to normal activities tomorrow.                            Written discharge instructions were provided to the                            patient.                           -  High fiber diet indefinitely.                           - Continue present medications.                           - Await pathology results.                           - Repeat colonoscopy in 5 years for surveillance if                            polyp is precancerous, otherwise 10 years.                           - Hyoscymanie 0.125 mg PO/SL q4h prn abdominal                            pain, 1 year of refills Ladene Artist, MD 10/04/2015 11:00:48 AM This report has been signed electronically.

## 2015-10-04 NOTE — Progress Notes (Signed)
Report to PACU, RN, vss, BBS= Clear.  

## 2015-10-04 NOTE — Patient Instructions (Signed)
YOU HAD AN ENDOSCOPIC PROCEDURE TODAY AT [facility]:   Refer to the procedure report that was given to you for any specific questions about what was found during the examination.  If the procedure report does not answer your questions, please call your gastroenterologist to clarify.  If you requested that your care partner not be given the details of your procedure findings, then the procedure report has been included in a sealed envelope for you to review at your convenience later.  YOU SHOULD EXPECT: Some feelings of bloating in the abdomen. Passage of more gas than usual.  Walking can help get rid of the air that was put into your GI tract during the procedure and reduce the bloating. If you had a lower endoscopy (such as a colonoscopy or flexible sigmoidoscopy) you may notice spotting of blood in your stool or on the toilet paper. If you underwent a bowel prep for your procedure, you may not have a normal bowel movement for a few days.  Please Note:  You might notice some irritation and congestion in your nose or some drainage.  This is from the oxygen used during your procedure.  There is no need for concern and it should clear up in a day or so.  SYMPTOMS TO REPORT IMMEDIATELY:   Following lower endoscopy (colonoscopy or flexible sigmoidoscopy):  Excessive amounts of blood in the stool  Significant tenderness or worsening of abdominal pains  Swelling of the abdomen that is new, acute  Fever of 100F or higher   For urgent or emergent issues, a gastroenterologist can be reached at any hour by calling [phone].   DIET: Your first meal following the procedure should be a small meal and then it is ok to progress to your normal diet. Heavy or fried foods are harder to digest and may make you feel nauseous or bloated.  Likewise, meals heavy in dairy and vegetables can increase bloating.  Drink plenty of fluids but you should avoid alcoholic beverages for 24  hours.  ACTIVITY:  You should plan to take it easy for the rest of today and you should NOT DRIVE or use heavy machinery until tomorrow (because of the sedation medicines used during the test).    FOLLOW UP: Our staff will call the number listed on your records the next business day following your procedure to check on you and address any questions or concerns that you may have regarding the information given to you following your procedure. If we do not reach you, we will leave a message.  However, if you are feeling well and you are not experiencing any problems, there is no need to return our call.  We will assume that you have returned to your regular daily activities without incident.  If any biopsies were taken you will be contacted by phone or by letter within the next 1-3 weeks.  Please call us at [phone] if you have not heard about the biopsies in 3 weeks.    SIGNATURES/CONFIDENTIALITY: You and/or your care partner have signed paperwork which will be entered into your electronic medical record.  These signatures attest to the fact that that the information above on your After Visit Summary has been reviewed and is understood.  Full responsibility of the confidentiality of this discharge information lies with you and/or your care-partner.  Polyp, diverticulosis, and high fiber information given. Use hycosamine as directed. Recall will be based upon pathology results.

## 2015-10-04 NOTE — Progress Notes (Signed)
Called to room to assist during endoscopic procedure.  Patient ID and intended procedure confirmed with present staff. Received instructions for my participation in the procedure from the performing physician.  

## 2015-10-07 ENCOUNTER — Telehealth: Payer: Self-pay

## 2015-10-07 NOTE — Telephone Encounter (Signed)
  Follow up Call-  Call back number 10/04/2015 04/12/2014  Post procedure Call Back phone  # [phone]  Permission to leave phone message Yes Yes     Patient questions:  Do you have a fever, pain , or abdominal swelling? No. Pain Score  0 *  Have you tolerated food without any problems? Yes.    Have you been able to return to your normal activities? Yes.    Do you have any questions about your discharge instructions: Diet   No. Medications  No. Follow up visit  No.  Do you have questions or concerns about your Care? No.  Actions: * If pain score is 4 or above: No action needed, pain <4.

## 2015-10-11 ENCOUNTER — Encounter: Payer: Self-pay | Admitting: Family Medicine

## 2015-10-11 ENCOUNTER — Other Ambulatory Visit: Payer: Self-pay | Admitting: Family Medicine

## 2015-10-11 DIAGNOSIS — G44029 Chronic cluster headache, not intractable: Secondary | ICD-10-CM

## 2015-10-11 MED ORDER — TRAMADOL HCL 50 MG PO TABS: 50.0000 mg | ORAL_TABLET | Freq: Four times a day (QID) | ORAL | Status: DC | PRN

## 2015-10-11 NOTE — Telephone Encounter (Signed)
RF request for tramadol LOV: 07/26/15 Next ov: None Last written: 08/02/15 #30 w/ 1RF  Please advise. Thanks.

## 2015-10-11 NOTE — Telephone Encounter (Signed)
Pt would like a refill on his tramadol called to [facility] on [address] please.

## 2015-10-11 NOTE — Telephone Encounter (Signed)
Rx faxed.   Pt advised and voiced understanding.   

## 2015-10-14 ENCOUNTER — Encounter: Payer: Self-pay | Admitting: Gastroenterology

## 2015-12-24 ENCOUNTER — Encounter: Payer: Self-pay | Admitting: Family Medicine

## 2015-12-24 ENCOUNTER — Ambulatory Visit (INDEPENDENT_AMBULATORY_CARE_PROVIDER_SITE_OTHER): Payer: BLUE CROSS/BLUE SHIELD | Admitting: Family Medicine

## 2015-12-24 VITALS — BP 113/77 | HR 94 | Temp 98.8°F | Resp 20 | Wt 186.2 lb

## 2015-12-24 DIAGNOSIS — K529 Noninfective gastroenteritis and colitis, unspecified: Secondary | ICD-10-CM | POA: Diagnosis not present

## 2015-12-24 NOTE — Progress Notes (Signed)
Benjamin [name] , 04/27/1971, 45 y.o., male MRN: [id number] Patient Care Team    Relationship Specialty Notifications Start End  Tammi Sou, MD PCP - General Family Medicine  03/06/14    Comment: Dr. Arnoldo Morale transfer  Darleen Crocker, MD Consulting Physician Ophthalmology  08/25/14   Ladene Artist, MD Consulting Physician Gastroenterology  12/18/14     CC: muscle aches/diarrhea Subjective: Pt presents for an acute OV with complaints of body aches of 3 daysduration.  Associated symptoms include headache consistent with cluster headache history. He complains of sharp pain in right temple 4-5x a day yesterday. He states his mother has a h/o meningioma. He body aches "all over" and that is worse at night. He endorses chills, nasal congestion and nonbloody diarrhea since Saturday. He had 8-10 episodes of loose/watery diarrhea yesterday. He has a h/o of diverticulosis and polyp, with recent colonoscopy. He states these symptoms are much different then his diverticular pain/flare. He denies dysuria, abdominal pain, fever, nausea or vomit. He has a decreased appetite, but has been able to tolerate liquids today. He had pizza the day prior to illness. No one else is ill in the family or exposure to illness. Pt has tried tylenol/ibf to ease their symptoms. He took tramadol, but that was not helpful.   Allergies  Allergen Reactions  . Bismuth Subsalicylate Swelling  . Penicillins Other (See Comments)    Childhood allergy  . Pepto-Bismol [Bismuth Subsalicylate] Swelling   Social History  Substance Use Topics  . Smoking status: Never Smoker  . Smokeless tobacco: Never Used  . Alcohol use 1.2 oz/week    2 Cans of beer per week   Past Medical History:  Diagnosis Date  . Adenomatous colon polyp 10/04/15  . ALLERGIC RHINITIS 07/11/2008  . Allergic rhinitis   . Allergy    SEASONAL  . 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 Anxiety   . BACK PAIN, CHRONIC, INTERMITTENT 10/17/2008  . Barrett's  esophagus determined by biopsy 04/12/14  . BENIGN PROSTATIC HYPERTROPHY, WITH URINARY OBSTRUCTION 07/21/2007  . BURSITIS, HIP 09/30/2009  . CAP (community acquired pneumonia) 06/2014  . Cataracts, bilateral 2016   Not visually significant  . Chronic renal insufficiency, stage II (mild)    GFR 60s-70s  . Diverticulitis 2016   CT 01/2015  . Gastric polyp 2006  . GERD without esophagitis   . Herpes labialis   . Hiatal hernia   . Hyperlipidemia   . HYPERTENSION 01/05/2007  . PSORIASIS 01/05/2007   Past Surgical History:  Procedure Laterality Date  . COLONOSCOPY W/ POLYPECTOMY  10/04/15   49mm tubular adenoma + diverticulosis: recall 5 yrs (Dr. Fuller Plan)  . ESOPHAGOGASTRODUODENOSCOPY  04/12/14   Barrett's esophagus  . LIPOMA EXCISION     several, arm   Family History  Problem Relation Age of Onset  . Colon cancer      neg hx  . Prostate cancer      neg hx  . CAD Father     around age 66  . Heart disease Father   . Hyperlipidemia Father   . Hypertension Father   . CAD Paternal Grandmother   . Hypertension Mother   . Hypertension Brother   . Hyperlipidemia Brother   . Lung cancer Maternal Grandmother   . Cancer Maternal Grandfather     type unknown  . Diabetes Paternal Grandmother      Medication List       Accurate as of 12/24/15 11:26 AM.  Always use your most recent med list.          acetaminophen 500 MG tablet Commonly known as:  TYLENOL Take 500 mg by mouth every 6 (six) hours as needed.   ALIGN 4 MG Caps 1 cap daily   bisoprolol-hydrochlorothiazide 5-6.25 MG tablet Commonly known as:  ZIAC Take 1 tablet by mouth daily.   buPROPion 300 MG 24 hr tablet Commonly known as:  WELLBUTRIN XL Take 1 tablet (300 mg total) by mouth daily.   fluticasone 50 MCG/ACT nasal spray Commonly known as:  FLONASE Place 2 sprays into both nostrils daily.   HYDROcodone-acetaminophen 5-325 MG tablet Commonly known as:  NORCO/VICODIN Take 1 tablet by mouth every 6 (six) hours as  needed for moderate pain.   hyoscyamine 0.125 MG SL tablet Commonly known as:  LEVSIN SL Place 1 tablet (0.125 mg total) under the tongue every 4 (four) hours as needed for cramping.   ondansetron 4 MG tablet Commonly known as:  ZOFRAN Take 1 tablet (4 mg total) by mouth every 8 (eight) hours as needed for nausea or vomiting.   pantoprazole 40 MG tablet Commonly known as:  PROTONIX Take 1 tablet (40 mg total) by mouth daily.   rosuvastatin 40 MG tablet Commonly known as:  CRESTOR Take 1 tablet (40 mg total) by mouth daily.   tamsulosin 0.4 MG Caps capsule Commonly known as:  FLOMAX 1 tab po qd   traMADol 50 MG tablet Commonly known as:  ULTRAM Take 1 tablet (50 mg total) by mouth every 6 (six) hours as needed for moderate pain.   triamcinolone cream 0.1 % Commonly known as:  KENALOG Apply 1 application topically 2 (two) times daily.   valACYclovir 500 MG tablet Commonly known as:  VALTREX Take 1 tablet (500 mg total) by mouth daily.       No results found for this or any previous visit (from the past 24 hour(s)). No results found.   ROS: Negative, with the exception of above mentioned in HPI  Objective:  BP 113/77 (BP Location: Right Arm, Patient Position: Sitting, Cuff Size: Large)   Pulse 94   Temp 98.8 F (37.1 C) (Oral)   Resp 20   Wt 186 lb 4 oz (84.5 kg)   SpO2 97%   BMI 28.32 kg/m  Body mass index is 28.32 kg/m. Gen: Afebrile. No acute distress. Nontoxic in appearance, well developed, well nourished. Pale. HENT: AT. [place]. Bilateral TM visualized, normal exma. Dry mucous membranes, no oral lesions. Bilateral nares without erythema or swelling. Throat without erythema or exudates. No PND, No cough, hoarseness, or facial sinus TTP.  Eyes:Pupils Equal Round Reactive to light, Extraocular movements intact,  Conjunctiva without redness, discharge or icterus. Neck/lymp/endocrine: Supple,no lymphadenopathy CV: RRR  Chest: CTAB, no wheeze or crackles.   Abd:  Soft. flat. Mild diffuse discomfort with palpation. BS present. no Masses palpated. No rebound or guarding.  MSK: no obvious deformities or erythema of joints. Skin: no rashes, purpura or petechiae.  Neuro: Normal gait. PERLA. EOMi. Alert. Oriented x3  Psych: mildly flat affect.   Assessment/Plan: Benjamin [name] is a 45 y.o. male present for acute OV for  Noninfectious gastroenteritis, unspecified - presumed viral gastro, but could be food poisoning.  - offered zofran, pt states he has some at home. If needed he will call in .  - Encouraged him to push fluids. He is very dry on exam today. Try clear liquid diet today, to bland soft diet.  -  NSAIDS for aches - discussed red flags/emergent symptoms.  - if not improving or worsening in 72 hours, or pt develops pain/fever/unable to tolerate liquids, he will need to see be seen immediately.  - Work excuse provided for today and tomorrow.    electronically signed by:  Howard Pouch, DO  [place]

## 2015-12-24 NOTE — Patient Instructions (Signed)
Viral Gastroenteritis Viral gastroenteritis is also known as stomach flu. This condition affects the stomach and intestinal tract. It can cause sudden diarrhea and vomiting. The illness typically lasts 3 to 8 days. Most people develop an immune response that eventually gets rid of the virus. While this natural response develops, the virus can make you quite ill. CAUSES  Many different viruses can cause gastroenteritis, such as rotavirus or noroviruses. You can catch one of these viruses by consuming contaminated food or water. You may also catch a virus by sharing utensils or other personal items with an infected person or by touching a contaminated surface. SYMPTOMS  The most common symptoms are diarrhea and vomiting. These problems can cause a severe loss of body fluids (dehydration) and a body salt (electrolyte) imbalance. Other symptoms may include:  Fever.  Headache.  Fatigue.  Abdominal pain. DIAGNOSIS  Your caregiver can usually diagnose viral gastroenteritis based on your symptoms and a physical exam. A stool sample may also be taken to test for the presence of viruses or other infections. TREATMENT  This illness typically goes away on its own. Treatments are aimed at rehydration. The most serious cases of viral gastroenteritis involve vomiting so severely that you are not able to keep fluids down. In these cases, fluids must be given through an intravenous line (IV). HOME CARE INSTRUCTIONS   Drink enough fluids to keep your urine clear or pale yellow. Drink small amounts of fluids frequently and increase the amounts as tolerated.  Ask your caregiver for specific rehydration instructions.  Avoid:  Foods high in sugar.  Alcohol.  Carbonated drinks.  Tobacco.  Juice.  Caffeine drinks.  Extremely hot or cold fluids.  Fatty, greasy foods.  Too much intake of anything at one time.  Dairy products until 24 to 48 hours after diarrhea stops.  You may consume probiotics.  Probiotics are active cultures of beneficial bacteria. They may lessen the amount and number of diarrheal stools in adults. Probiotics can be found in yogurt with active cultures and in supplements.  Wash your hands well to avoid spreading the virus.  Only take over-the-counter or prescription medicines for pain, discomfort, or fever as directed by your caregiver. Do not give aspirin to children. Antidiarrheal medicines are not recommended.  Ask your caregiver if you should continue to take your regular prescribed and over-the-counter medicines.  Keep all follow-up appointments as directed by your caregiver. SEEK IMMEDIATE MEDICAL CARE IF:   You are unable to keep fluids down.  You do not urinate at least once every 6 to 8 hours.  You develop shortness of breath.  You notice blood in your stool or vomit. This may look like coffee grounds.  You have abdominal pain that increases or is concentrated in one small area (localized).  You have persistent vomiting or diarrhea.  You have a fever.  The patient is a child younger than 3 months, and he or she has a fever.  The patient is a child older than 3 months, and he or she has a fever and persistent symptoms.  The patient is a child older than 3 months, and he or she has a fever and symptoms suddenly get worse.  The patient is a baby, and he or she has no tears when crying. MAKE SURE YOU:   Understand these instructions.  Will watch your condition.  Will get help right away if you are not doing well or get worse.   This information is not intended to replace  advice given to you by your health care provider. Make sure you discuss any questions you have with your health care provider.   Document Released: 05/18/2005 Document Revised: 08/10/2011 Document Reviewed: 03/04/2011 Elsevier Interactive Patient Education 2016 [place]! Urine should almost be clear.  Zofran and clear liquid to bland diet today.   Advil for discomfort.  If abd pain or fever sets in, will need to see. If not resolved or worsening in 72 hours will need to be seen. Will also need to be seen if not tolerating fluids.

## 2015-12-25 ENCOUNTER — Telehealth: Payer: Self-pay | Admitting: Family Medicine

## 2015-12-25 NOTE — Telephone Encounter (Signed)
Spoke with patient reviewed information with patient . Patient verbalized understanding.

## 2015-12-25 NOTE — Telephone Encounter (Signed)
Please call pt: - his condition, even if caused by improperly prepared chicken on Sunday would be treated the same.  - Gastroenteritis can last 4-10 days, no matter the cause and usually resolves on its own.  - Treatment with abx are indicated if he is having > 10 stools a day, high fever or is unable to maintain adequate hydration. If he is not maintaining hydration  IVF is sometimes needed as well.

## 2015-12-25 NOTE — Telephone Encounter (Signed)
Patient states he was seen in the office yesterday for gastroenteritis.  He reports he is not feeling any better today.  He also states he failed to mention to Dr. Raoul Pitch that he ate a portion of undercooked chicken at a restaurant on Sunday night.  Patient wants to know if treatment plan should be changed since he is still not feeling better.

## 2015-12-31 ENCOUNTER — Telehealth: Payer: Self-pay | Admitting: *Deleted

## 2015-12-31 NOTE — Telephone Encounter (Signed)
Patient states he ishaving another flare up of his diverticulitis . He wants to know if you will Rx some tramadol for his pain. He states he doesn't need anything stronger than that . Please advise.

## 2016-01-01 NOTE — Telephone Encounter (Signed)
Left message with information on patient voice mail 

## 2016-01-01 NOTE — Telephone Encounter (Signed)
Pt needs to follow up with his PCP with an appt, if he is having a flare of diverticulitis.

## 2016-01-08 ENCOUNTER — Other Ambulatory Visit: Payer: Self-pay | Admitting: *Deleted

## 2016-01-08 DIAGNOSIS — G44029 Chronic cluster headache, not intractable: Secondary | ICD-10-CM

## 2016-01-08 MED ORDER — TRAMADOL HCL 50 MG PO TABS: 50.0000 mg | ORAL_TABLET | Freq: Four times a day (QID) | ORAL | 1 refills | Status: DC | PRN

## 2016-01-08 NOTE — Telephone Encounter (Signed)
I am sending in tramadol rx RF. Pls call pt and clarify:  is he taking this for intermittent low back pain?-thx

## 2016-01-08 NOTE — Telephone Encounter (Signed)
[facility] [address]  RF request for tramadol LOV: 07/26/15 Next ov: None Last written: 10/11/15 #30 w/ 1RF  Please advise. Thanks.

## 2016-01-09 ENCOUNTER — Other Ambulatory Visit: Payer: Self-pay | Admitting: *Deleted

## 2016-01-09 DIAGNOSIS — E785 Hyperlipidemia, unspecified: Secondary | ICD-10-CM

## 2016-01-09 MED ORDER — ROSUVASTATIN CALCIUM 40 MG PO TABS: 40.0000 mg | ORAL_TABLET | ORAL | 12 refills | Status: DC

## 2016-01-09 NOTE — Telephone Encounter (Signed)
Rx faxed

## 2016-01-09 NOTE — Telephone Encounter (Signed)
Left message for pt to call back  °

## 2016-01-09 NOTE — Telephone Encounter (Signed)
Spoke to pt he stated that he mainly uses the tramadol for his headaches.

## 2016-01-09 NOTE — Telephone Encounter (Signed)
Pt called requesting refill for Crestor. He stated that he hasn't had this filled in a long time because he is only taking 1 tablet a week was given a lot of samples. He stated that he has been out for a few weeks and would [facility]   RF request for crestor LOV: 07/26/15 Next ov: None Last written: 02/05/15 #30 w/ [id number]  Rx sent.

## 2016-01-10 ENCOUNTER — Encounter: Payer: Self-pay | Admitting: Family Medicine

## 2016-02-13 ENCOUNTER — Telehealth: Payer: Self-pay | Admitting: *Deleted

## 2016-02-13 ENCOUNTER — Ambulatory Visit (INDEPENDENT_AMBULATORY_CARE_PROVIDER_SITE_OTHER): Payer: BLUE CROSS/BLUE SHIELD | Admitting: Family Medicine

## 2016-02-13 ENCOUNTER — Encounter: Payer: Self-pay | Admitting: Family Medicine

## 2016-02-13 VITALS — BP 124/89 | HR 71 | Temp 98.2°F | Resp 20 | Wt 189.8 lb

## 2016-02-13 DIAGNOSIS — L709 Acne, unspecified: Secondary | ICD-10-CM

## 2016-02-13 DIAGNOSIS — H578 Other specified disorders of eye and adnexa: Secondary | ICD-10-CM

## 2016-02-13 DIAGNOSIS — H5789 Other specified disorders of eye and adnexa: Secondary | ICD-10-CM

## 2016-02-13 MED ORDER — CLINDAMYCIN PHOS-BENZOYL PEROX 1-5 % EX GEL: CUTANEOUS | 3 refills | Status: DC

## 2016-02-13 MED ORDER — DOXYCYCLINE HYCLATE 100 MG PO TABS: 100.0000 mg | ORAL_TABLET | Freq: Two times a day (BID) | ORAL | 0 refills | Status: DC

## 2016-02-13 NOTE — Progress Notes (Signed)
OFFICE VISIT  02/13/2016   CC:  Chief Complaint  Patient presents with  . Facial Swelling    left eye   HPI:    Patient is a 45 y.o. Caucasian male who presents for swelling of soft tissue just nasal to the left upper eyelid. Little red spot is there. Reports history of recurrent acne on shoulders, worse when he used to wear a vest for [employer].   Says benzaclin gel use in the past and doxycycline were both helpful.   He has been on neither of these lately.  ROS: no f/c/malaise.  No vision complaints.   Past Medical History:  Diagnosis Date  . Adenomatous colon polyp 10/04/15  . ALLERGIC RHINITIS 07/11/2008  . Allergy    SEASONAL  . 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 Anxiety   . BACK PAIN, CHRONIC, INTERMITTENT 10/17/2008  . Barrett's esophagus determined by biopsy 04/12/14  . BENIGN PROSTATIC HYPERTROPHY, WITH URINARY OBSTRUCTION 07/21/2007  . BURSITIS, HIP 09/30/2009  . CAP (community acquired pneumonia) 06/2014  . Cataracts, bilateral 2016   Not visually significant  . Chronic headaches    tramadol  . Chronic renal insufficiency, stage II (mild)    GFR 60s-70s  . Diverticulitis 2016   CT 01/2015  . Gastric polyp 2006  . GERD without esophagitis   . Herpes labialis   . Hiatal hernia   . Hyperlipidemia   . HYPERTENSION 01/05/2007  . PSORIASIS 01/05/2007    Past Surgical History:  Procedure Laterality Date  . COLONOSCOPY W/ POLYPECTOMY  10/04/15   31mm tubular adenoma + diverticulosis: recall 5 yrs (Dr. Fuller Plan)  . ESOPHAGOGASTRODUODENOSCOPY  04/12/14   Barrett's esophagus  . LIPOMA EXCISION     several, arm    Outpatient Medications Prior to Visit  Medication Sig Dispense Refill  . acetaminophen (TYLENOL) 500 MG tablet Take 500 mg by mouth every 6 (six) hours as needed.    . bisoprolol-hydrochlorothiazide (ZIAC) 5-6.25 MG tablet Take 1 tablet by mouth daily. 90 tablet 3  . buPROPion (WELLBUTRIN XL) 300 MG 24 hr tablet Take 1 tablet (300 mg total) by mouth  daily. 90 tablet 3  . fluticasone (FLONASE) 50 MCG/ACT nasal spray Place 2 sprays into both nostrils daily. 16 g 6  . hyoscyamine (LEVSIN SL) 0.125 MG SL tablet Place 1 tablet (0.125 mg total) under the tongue every 4 (four) hours as needed for cramping. 120 tablet 11  . ondansetron (ZOFRAN) 4 MG tablet Take 1 tablet (4 mg total) by mouth every 8 (eight) hours as needed for nausea or vomiting. 20 tablet 0  . pantoprazole (PROTONIX) 40 MG tablet Take 1 tablet (40 mg total) by mouth daily. 90 tablet 3  . rosuvastatin (CRESTOR) 40 MG tablet Take 1 tablet (40 mg total) by mouth once a week. 4 tablet 12  . tamsulosin (FLOMAX) 0.4 MG CAPS capsule 1 tab po qd 90 capsule 3  . traMADol (ULTRAM) 50 MG tablet Take 1 tablet (50 mg total) by mouth every 6 (six) hours as needed for moderate pain. 30 tablet 1  . triamcinolone cream (KENALOG) 0.1 % Apply 1 application topically 2 (two) times daily. 30 g 0  . valACYclovir (VALTREX) 500 MG tablet Take 1 tablet (500 mg total) by mouth daily. 30 tablet 12  . Probiotic Product (ALIGN) 4 MG CAPS 1 cap daily (Patient not taking: Reported on 02/13/2016) 30 capsule 1  . HYDROcodone-acetaminophen (NORCO/VICODIN) 5-325 MG tablet Take 1 tablet by mouth every 6 (  six) hours as needed for moderate pain. (Patient not taking: Reported on 02/13/2016) 30 tablet 0   No facility-administered medications prior to visit.     Allergies  Allergen Reactions  . Bismuth Subsalicylate Swelling  . Penicillins Other (See Comments)    Childhood allergy  . Pepto-Bismol [Bismuth Subsalicylate] Swelling    ROS As per HPI  PE: Blood pressure 124/89, pulse 71, temperature 98.2 F (36.8 C), resp. rate 20, weight 189 lb 12 oz (86.1 kg), SpO2 98 %. Gen: Alert, well appearing.  Patient is oriented to person, place, time, and situation. Soft tissue on upper left side of nose near intersection with upper eyelid has a bit of swelling, with a pink comedonal lesion in the center.  Upper and lower  eyelids of both eyes without swelling.  EOMI.  No eye drainage. Shoulders and back without any comedonal lesions but a few scattered hyperpigmented scars are noted.  LABS:  none  IMPRESSION AND PLAN:  Adult acne, mild STS associated with a lesion near L eye. Doxycycline 100 mg bid x 7d. Benzaclin gel rx'd to use bid prn when he gets outbreaks on shoulders/back/chest or face.  An After Visit Summary was printed and given to the patient.  FOLLOW UP: Return if symptoms worsen or fail to improve.  Signed:  Crissie Sickles, MD           02/13/2016

## 2016-02-13 NOTE — Telephone Encounter (Signed)
Pharmacy called patient doesn't want to pay for benzaclin gel it is 60.00. They are requesting to change this to Duac gel same ingredients but is only 13.00. Per Dr Anitra Lauth ok to change.

## 2016-04-28 ENCOUNTER — Ambulatory Visit (INDEPENDENT_AMBULATORY_CARE_PROVIDER_SITE_OTHER): Payer: BLUE CROSS/BLUE SHIELD | Admitting: Family Medicine

## 2016-04-28 ENCOUNTER — Encounter: Payer: Self-pay | Admitting: Family Medicine

## 2016-04-28 VITALS — BP 127/90 | HR 78 | Temp 97.9°F | Resp 20 | Wt 195.5 lb

## 2016-04-28 DIAGNOSIS — J0100 Acute maxillary sinusitis, unspecified: Secondary | ICD-10-CM | POA: Diagnosis not present

## 2016-04-28 MED ORDER — DOXYCYCLINE HYCLATE 100 MG PO TABS: 100.0000 mg | ORAL_TABLET | Freq: Two times a day (BID) | ORAL | 0 refills | Status: DC

## 2016-04-28 NOTE — Progress Notes (Signed)
[name] , [birth year], 45 y.o., male MRN: [id number] Patient Care Team    Relationship Specialty Notifications Start End  Tammi Sou, MD PCP - General Family Medicine  03/06/14    Comment: Dr. Arnoldo Morale transfer  Darleen Crocker, MD Consulting Physician Ophthalmology  08/25/14   Ladene Artist, MD Consulting Physician Gastroenterology  12/18/14     CC: sinus pressure Subjective: Pt presents for an acute OV with complaints of sinus pressure of 2 weeks duration.  Associated symptoms include facial pressure, headache, nasal congestion. He denies fever, chills, nausea or vomit. No known sick contacts. Pt has tried flonase and zyrtec D. His symptoms has cleared up for a few days and then returned, with more severe symptoms.    Allergies  Allergen Reactions  . Bismuth Subsalicylate Swelling  . Penicillins Other (See Comments)    Childhood allergy  . Pepto-Bismol [Bismuth Subsalicylate] Swelling   Social History  Substance Use Topics  . Smoking status: Never Smoker  . Smokeless tobacco: Never Used  . Alcohol use 1.2 oz/week    2 Cans of beer per week   Past Medical History:  Diagnosis Date  . Adenomatous colon polyp 10/04/15  . ALLERGIC RHINITIS 07/11/2008  . Allergy    SEASONAL  . 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 Anxiety   . BACK PAIN, CHRONIC, INTERMITTENT 10/17/2008  . Barrett's esophagus determined by biopsy 04/12/14  . BENIGN PROSTATIC HYPERTROPHY, WITH URINARY OBSTRUCTION 07/21/2007  . BURSITIS, HIP 09/30/2009  . CAP (community acquired pneumonia) 06/2014  . Cataracts, bilateral 2016   Not visually significant  . Chronic headaches    tramadol  . Chronic renal insufficiency, stage II (mild)    GFR 60s-70s  . Diverticulitis 2016   CT 01/2015  . Gastric polyp 2006  . GERD without esophagitis   . Herpes labialis   . Hiatal hernia   . Hyperlipidemia   . HYPERTENSION 01/05/2007  . PSORIASIS 01/05/2007   Past Surgical History:  Procedure Laterality Date  .  COLONOSCOPY W/ POLYPECTOMY  10/04/15   2mm tubular adenoma + diverticulosis: recall 5 yrs (Dr. Fuller Plan)  . ESOPHAGOGASTRODUODENOSCOPY  04/12/14   Barrett's esophagus  . LIPOMA EXCISION     several, arm   Family History  Problem Relation Age of Onset  . CAD Father     around age 1  . Heart disease Father   . Hyperlipidemia Father   . Hypertension Father   . CAD Paternal Grandmother   . Diabetes Paternal Grandmother   . Hypertension Mother   . Hypertension Brother   . Hyperlipidemia Brother   . Lung cancer Maternal Grandmother   . Cancer Maternal Grandfather     type unknown  . Colon cancer      neg hx  . Prostate cancer      neg hx     Medication List       Accurate as of 04/28/16  2:44 PM. Always use your most recent med list.          acetaminophen 500 MG tablet Commonly known as:  TYLENOL Take 500 mg by mouth every 6 (six) hours as needed.   ALIGN 4 MG Caps 1 cap daily   bisoprolol-hydrochlorothiazide 5-6.25 MG tablet Commonly known as:  ZIAC Take 1 tablet by mouth daily.   buPROPion 300 MG 24 hr tablet Commonly known as:  WELLBUTRIN XL Take 1 tablet (300 mg total) by mouth daily.   clindamycin-benzoyl peroxide gel  Commonly known as:  BENZACLIN Apply to affected areas bid prn   esomeprazole 20 MG capsule Commonly known as:  NEXIUM Take 20 mg by mouth daily at 12 noon.   fluticasone 50 MCG/ACT nasal spray Commonly known as:  FLONASE Place 2 sprays into both nostrils daily.   hyoscyamine 0.125 MG SL tablet Commonly known as:  LEVSIN SL Place 1 tablet (0.125 mg total) under the tongue every 4 (four) hours as needed for cramping.   ondansetron 4 MG tablet Commonly known as:  ZOFRAN Take 1 tablet (4 mg total) by mouth every 8 (eight) hours as needed for nausea or vomiting.   pantoprazole 40 MG tablet Commonly known as:  PROTONIX Take 1 tablet (40 mg total) by mouth daily.   rosuvastatin 40 MG tablet Commonly known as:  CRESTOR Take 1 tablet (40  mg total) by mouth once a week.   tamsulosin 0.4 MG Caps capsule Commonly known as:  FLOMAX 1 tab po qd   traMADol 50 MG tablet Commonly known as:  ULTRAM Take 1 tablet (50 mg total) by mouth every 6 (six) hours as needed for moderate pain.   triamcinolone cream 0.1 % Commonly known as:  KENALOG Apply 1 application topically 2 (two) times daily.   valACYclovir 500 MG tablet Commonly known as:  VALTREX Take 1 tablet (500 mg total) by mouth daily.       No results found for this or any previous visit (from the past 24 hour(s)). No results found.   ROS: Negative, with the exception of above mentioned in HPI   Objective:  BP 127/90 (BP Location: Right Arm, Patient Position: Sitting, Cuff Size: Large)   Pulse 78   Temp 97.9 F (36.6 C)   Resp 20   Wt 195 lb 8 oz (88.7 kg)   SpO2 98%   BMI 29.73 kg/m  Body mass index is 29.73 kg/m. Gen: Afebrile. No acute distress. Nontoxic in appearance, well developed, well nourished. pleasant male. HENT: AT. . Bilateral TM visualized left TM air fluid level. MMM, no oral lesions. Bilateral nares with erythema. Throat without erythema or exudates. PND present. Cough, TTP left max sinus.  Eyes:Pupils Equal Round Reactive to light, Extraocular movements intact,  Conjunctiva without redness, discharge or icterus. Neck/lymp/endocrine: Supple, No lymphadenopathy CV: RRR  Chest: CTAB, no wheeze or crackles. Good air movement, normal resp effort.  Abd: Soft. NTND. BS present.  Skin: no rashes, purpura or petechiae.  Neuro: Normal gait. PERLA. EOMi. Alert. Oriented x3  Assessment/Plan: Benjamin [name] is a 45 y.o. male present for acute OV for  Acute maxillary sinusitis, recurrence not specified Nasal saline TID.  Flonase, mucinex DM, plain zyrtec Doxycyline BID for 10 days Rest, hydrate.  F/U PRN  electronically signed by:  Howard Pouch, DO  [place]

## 2016-04-28 NOTE — Patient Instructions (Addendum)

## 2016-05-13 ENCOUNTER — Other Ambulatory Visit: Payer: Self-pay | Admitting: *Deleted

## 2016-05-13 DIAGNOSIS — G44029 Chronic cluster headache, not intractable: Secondary | ICD-10-CM

## 2016-05-13 MED ORDER — TRAMADOL HCL 50 MG PO TABS: 50.0000 mg | ORAL_TABLET | Freq: Four times a day (QID) | ORAL | 1 refills | Status: DC | PRN

## 2016-05-13 NOTE — Telephone Encounter (Signed)
Patient called requesting refill on his Tramadol.

## 2016-05-13 NOTE — Telephone Encounter (Signed)
Rx faxed

## 2016-05-13 NOTE — Telephone Encounter (Signed)
RF request for tramadol LOV: CPE- 07/26/15, Acute- 04/28/16 Next ov: None Last written: 01/08/16 #30 w/ 1RF  Please advise. Thanks.

## 2016-06-14 IMAGING — CR DG CHEST 2V
2 series · 2 of 2 positions shown · non-contrast
Comparison: None.

CLINICAL DATA: Cough.

EXAM:
CHEST  2 VIEW

[w chest pa]
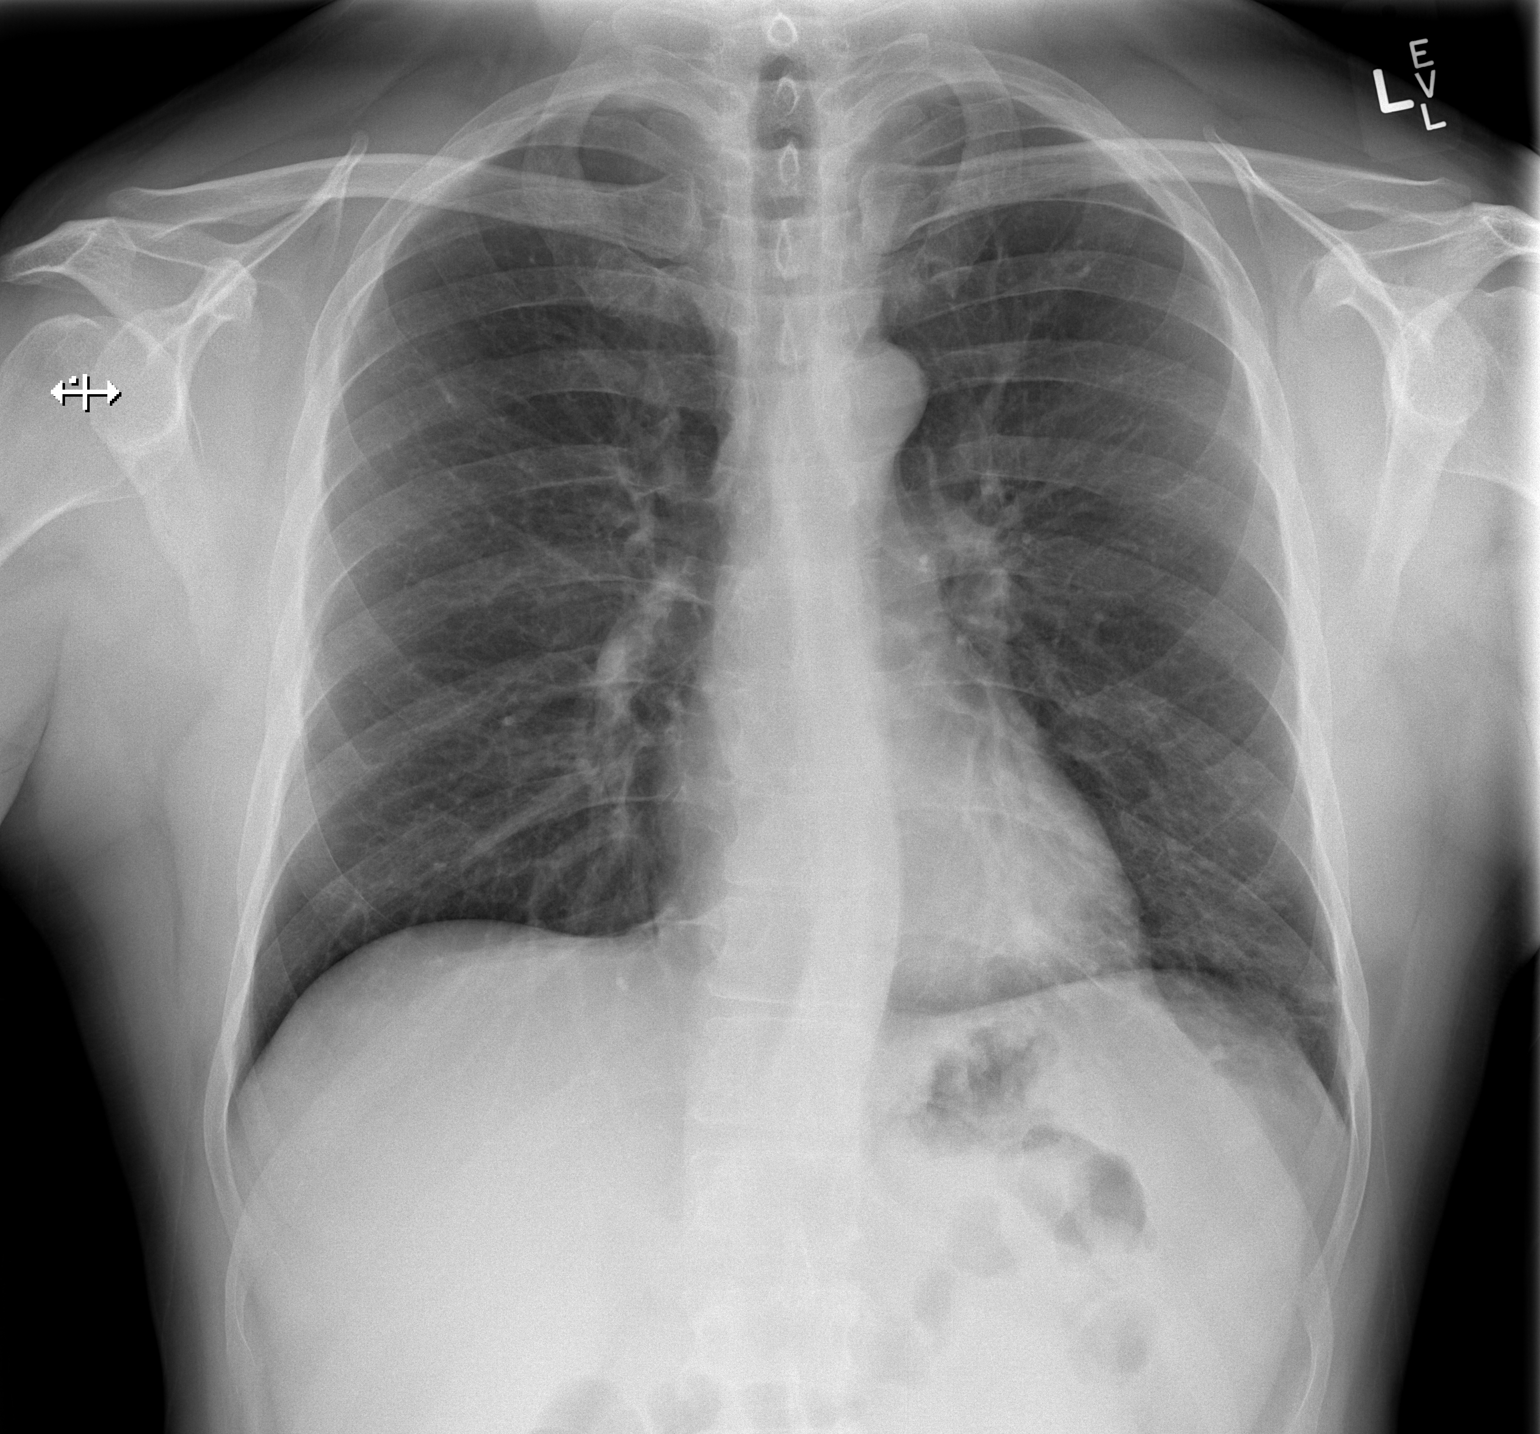

[w chest lat]
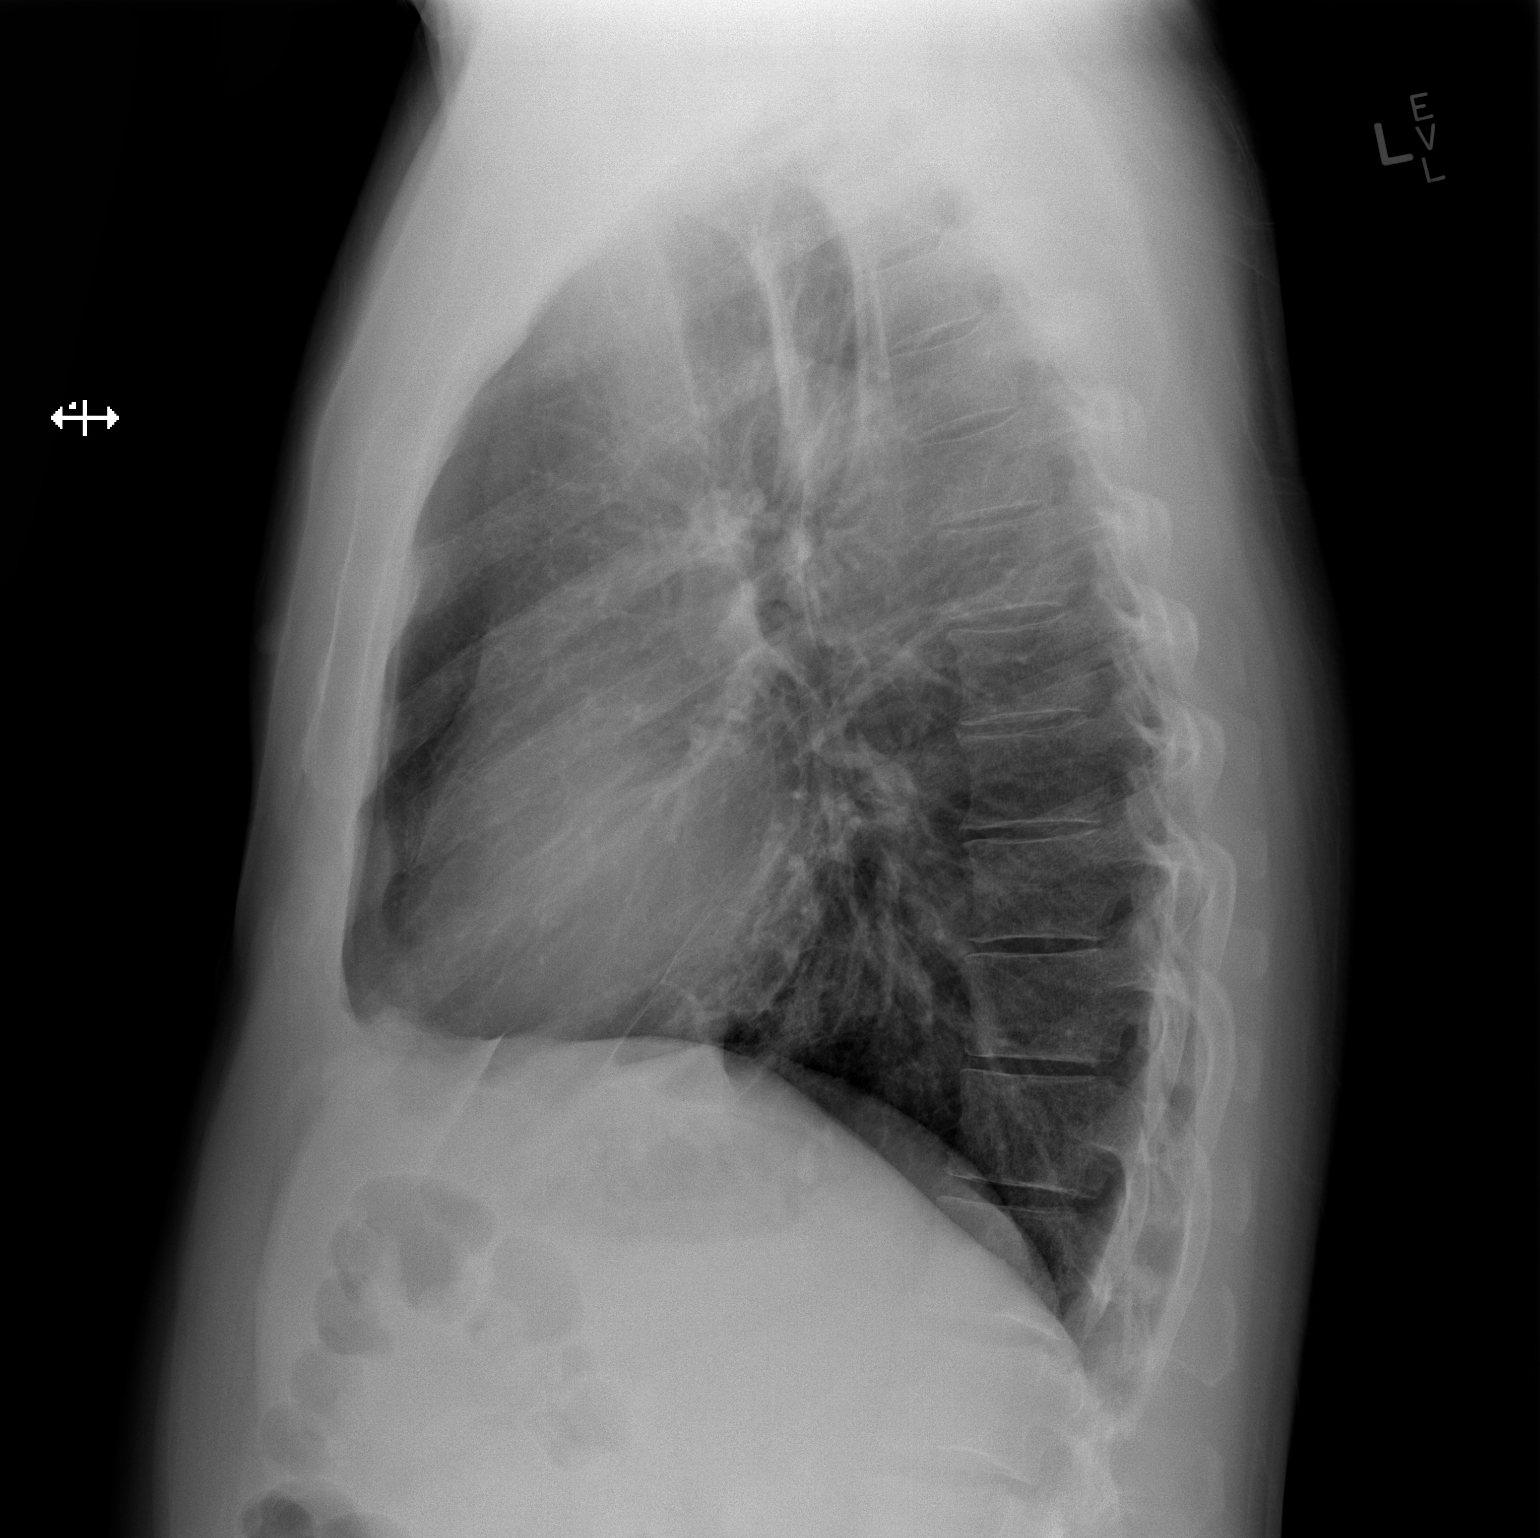

[2 of 2 positions shown; findings below may reference images not displayed]

FINDINGS: Patchy airspace opacity in the lingula and to lesser extent left
lower lobe. The right lung is clear. Cardiomediastinal contours are
normal. There is no pleural effusion or pneumothorax. Pulmonary
vasculature is normal. No acute osseous abnormality is seen.
IMPRESSION: Patchy airspace opacity in the lingula and left lower lobe
consistent with pneumonia.

## 2016-08-05 ENCOUNTER — Other Ambulatory Visit: Payer: Self-pay | Admitting: *Deleted

## 2016-08-05 MED ORDER — VALACYCLOVIR HCL 500 MG PO TABS: 500.0000 mg | ORAL_TABLET | Freq: Every day | ORAL | 12 refills | Status: DC

## 2016-08-05 NOTE — Telephone Encounter (Signed)
[facility]  RF request for valacyclovir LOV: 07/26/15 Next ov: None Last written: 07/26/15 #30 w/ 12RF  Please advise. Thanks.

## 2016-09-02 ENCOUNTER — Other Ambulatory Visit: Payer: Self-pay | Admitting: *Deleted

## 2016-09-02 MED ORDER — BISOPROLOL-HYDROCHLOROTHIAZIDE 5-6.25 MG PO TABS: 1.0000 | ORAL_TABLET | Freq: Every day | ORAL | 0 refills | Status: DC

## 2016-09-02 NOTE — Telephone Encounter (Signed)
[facility]  RF request for bisoprolol/hctz LOV: 07/26/15 Next ov: None Last written: 08/26/15 #90 w/ 3RF  Will send Rx for #90 w/ 0RF. Pt will needs office visit for CPE or f/u [facility] for more refills.

## 2016-09-04 NOTE — Telephone Encounter (Signed)
MyChart message read.

## 2016-10-05 ENCOUNTER — Other Ambulatory Visit: Payer: Self-pay | Admitting: *Deleted

## 2016-10-05 MED ORDER — BUPROPION HCL ER (XL) 300 MG PO TB24: 300.0000 mg | ORAL_TABLET | Freq: Every day | ORAL | 0 refills | Status: DC

## 2016-10-05 NOTE — Telephone Encounter (Signed)
MyChart message read.

## 2016-10-05 NOTE — Telephone Encounter (Signed)
[facility].  RF request for bupropion LOV: 07/21/15 Next ov: None Last written: 08/26/15 #90 w/ 3RF  Will send Rx for #90 w/ 0RF. Pt is over due for f/u [facility] or CPE.

## 2016-10-15 ENCOUNTER — Other Ambulatory Visit: Payer: Self-pay | Admitting: *Deleted

## 2016-10-15 NOTE — Telephone Encounter (Signed)
[facility].  RF request for tamsulosin LOV: 09/02/15 Next ov: None Last written: 09/12/15 #90 w/ 3RF  Will send Rx for #90 w/ 0RF. Pt is due for f/u [facility], needs office visit for more refills.

## 2016-10-19 NOTE — Telephone Encounter (Signed)
Left message for pt to call back or check mychart. 

## 2016-10-23 ENCOUNTER — Encounter: Payer: Self-pay | Admitting: Family Medicine

## 2016-10-23 ENCOUNTER — Encounter: Payer: Self-pay | Admitting: *Deleted

## 2016-10-23 ENCOUNTER — Ambulatory Visit (INDEPENDENT_AMBULATORY_CARE_PROVIDER_SITE_OTHER): Payer: BLUE CROSS/BLUE SHIELD | Admitting: Family Medicine

## 2016-10-23 VITALS — BP 130/83 | HR 70 | Temp 97.9°F | Resp 16 | Ht 69.0 in | Wt 195.5 lb

## 2016-10-23 DIAGNOSIS — G44029 Chronic cluster headache, not intractable: Secondary | ICD-10-CM | POA: Diagnosis not present

## 2016-10-23 DIAGNOSIS — Z79891 Long term (current) use of opiate analgesic: Secondary | ICD-10-CM | POA: Diagnosis not present

## 2016-10-23 DIAGNOSIS — G894 Chronic pain syndrome: Secondary | ICD-10-CM

## 2016-10-23 DIAGNOSIS — Z0000 Encounter for general adult medical examination without abnormal findings: Secondary | ICD-10-CM | POA: Diagnosis not present

## 2016-10-23 LAB — LDL CHOLESTEROL, DIRECT: Direct LDL: 94 mg/dL

## 2016-10-23 LAB — CBC WITH DIFFERENTIAL/PLATELET
Basophils Absolute: 0.3 10*3/uL — ABNORMAL HIGH (ref 0.0–0.1)
Basophils Relative: 3.4 % — ABNORMAL HIGH (ref 0.0–3.0)
Eosinophils Absolute: 0.3 10*3/uL (ref 0.0–0.7)
Eosinophils Relative: 4.3 % (ref 0.0–5.0)
HCT: 47.6 % (ref 39.0–52.0)
Hemoglobin: 16.2 g/dL (ref 13.0–17.0)
Lymphocytes Relative: 29.8 % (ref 12.0–46.0)
Lymphs Abs: 2.4 10*3/uL (ref 0.7–4.0)
MCHC: 34 g/dL (ref 30.0–36.0)
MCV: 90.6 fl (ref 78.0–100.0)
Monocytes Absolute: 0.6 10*3/uL (ref 0.1–1.0)
Monocytes Relative: 7.8 % (ref 3.0–12.0)
Neutro Abs: 4.4 10*3/uL (ref 1.4–7.7)
Neutrophils Relative %: 54.7 % (ref 43.0–77.0)
Platelets: 355 10*3/uL (ref 150.0–400.0)
RBC: 5.26 Mil/uL (ref 4.22–5.81)
RDW: 12.8 % (ref 11.5–15.5)
WBC: 8 10*3/uL (ref 4.0–10.5)

## 2016-10-23 LAB — COMPREHENSIVE METABOLIC PANEL
ALT: 25 U/L (ref 0–53)
AST: 19 U/L (ref 0–37)
Albumin: 4.8 g/dL (ref 3.5–5.2)
Alkaline Phosphatase: 52 U/L (ref 39–117)
BUN: 23 mg/dL (ref 6–23)
CO2: 28 mEq/L (ref 19–32)
Calcium: 9.9 mg/dL (ref 8.4–10.5)
Chloride: 103 mEq/L (ref 96–112)
Creatinine, Ser: 1.28 mg/dL (ref 0.40–1.50)
GFR: 63.98 mL/min (ref >60.00)
Glucose, Bld: 87 mg/dL (ref 70–99)
Potassium: 4.2 mEq/L (ref 3.5–5.1)
Sodium: 139 mEq/L (ref 135–145)
Total Bilirubin: 0.6 mg/dL (ref 0.2–1.2)
Total Protein: 7.4 g/dL (ref 6.0–8.3)

## 2016-10-23 LAB — LIPID PANEL
Cholesterol: 195 mg/dL (ref 0–200)
HDL: 37.2 mg/dL — ABNORMAL LOW (ref >39.00)
NonHDL: 157.44
Total CHOL/HDL Ratio: 5
Triglycerides: 282 mg/dL — ABNORMAL HIGH (ref 0.0–149.0)
VLDL: 56.4 mg/dL — ABNORMAL HIGH (ref 0.0–40.0)

## 2016-10-23 LAB — TSH: TSH: 1.52 u[IU]/mL (ref 0.35–4.50)

## 2016-10-23 MED ORDER — TAMSULOSIN HCL 0.4 MG PO CAPS: ORAL_CAPSULE | ORAL | 0 refills | Status: DC

## 2016-10-23 MED ORDER — TRAMADOL HCL 50 MG PO TABS: 50.0000 mg | ORAL_TABLET | Freq: Four times a day (QID) | ORAL | 1 refills | Status: DC | PRN

## 2016-10-23 NOTE — Progress Notes (Signed)
Office Note 10/23/2016  CC:  Chief Complaint  Patient presents with  . Annual Exam    pt is fasting.     HPI:  Benjamin [name] is a 46 y.o. White male who is here for annual health maintenance exam--also pain mgmt f/u. Still having chronic daily headaches.  These partially respond to advil/tylenol.  Tramadol helps as well, and he has used the semi-chronically for maximization of quality of life/functioning. Has not taken any tramadol in about 2 mo.  Eyes: most recent exam 2 yrs ago---eyes getting worse, needs exam. Dental: preventatives annually.  Exercise: goes to the [employer], does some walking with new 2 mo old daughter. Diet: not working on anything in particular.  Feels like diet is mildly healthy.  Indication for chronic opioid: chronic daily HA's. Medication and dose: tramadol 50mg ,1 q6h prn # pills per month: #90 every few months. Last UDS date: 06/09/13 Pain contract signed (Y/N): no--done today. Date narcotic database last reviewed (include red flags): today, no red flags.  Past Medical History:  Diagnosis Date  . Adenomatous colon polyp 10/04/15  . ALLERGIC RHINITIS 07/11/2008  . Allergy    SEASONAL  . 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 Anxiety   . BACK PAIN, CHRONIC, INTERMITTENT 10/17/2008  . Barrett's esophagus determined by biopsy 04/12/14  . BENIGN PROSTATIC HYPERTROPHY, WITH URINARY OBSTRUCTION 07/21/2007  . BURSITIS, HIP 09/30/2009  . CAP (community acquired pneumonia) 06/2014  . Cataracts, bilateral 2016   Not visually significant  . Chronic headaches    tramadol  . Chronic renal insufficiency, stage II (mild)    GFR 60s-70s  . Diverticulitis 2016   CT 01/2015  . Gastric polyp 2006  . GERD without esophagitis   . Herpes labialis   . Hiatal hernia   . Hyperlipidemia   . HYPERTENSION 01/05/2007  . PSORIASIS 01/05/2007    Past Surgical History:  Procedure Laterality Date  . COLONOSCOPY W/ POLYPECTOMY  10/04/15   93mm tubular adenoma +  diverticulosis: recall 5 yrs (Dr. Fuller Plan)  . ESOPHAGOGASTRODUODENOSCOPY  04/12/14   Barrett's esophagus  . LIPOMA EXCISION     several, arm    Family History  Problem Relation Age of Onset  . CAD Father        around age 16  . Heart disease Father   . Hyperlipidemia Father   . Hypertension Father   . CAD Paternal Grandmother   . Diabetes Paternal Grandmother   . Hypertension Mother   . Hypertension Brother   . Hyperlipidemia Brother   . Lung cancer Maternal Grandmother   . Cancer Maternal Grandfather        type unknown  . Colon cancer Unknown        neg hx  . Prostate cancer Unknown        neg hx    Social History   Social History  . Marital status: Married    Spouse name: N/A  . Number of children: 2  . Years of education: N/A   Occupational History  . account manager [employer]   Social History Main Topics  . Smoking status: Never Smoker  . Smokeless tobacco: Never Used  . Alcohol use 1.2 oz/week    2 Cans of beer per week  . Drug use: No  . Sexual activity: Yes    Birth control/ protection: None   Other Topics Concern  . Not on file   Social History Narrative   Currently separated as  of 01/2014, 2 children.  One younger brother.   Occupation: Press photographer for [employer].   No tobacco.   Occ alcohol (1-2 x/month).  No hx of alc/drug prob.   Exercise: runs about 2 times a week.   Diet: regular american diet.             Outpatient Medications Prior to Visit  Medication Sig Dispense Refill  . acetaminophen (TYLENOL) 500 MG tablet Take 500 mg by mouth every 6 (six) hours as needed.    . bisoprolol-hydrochlorothiazide (ZIAC) 5-6.25 MG tablet Take 1 tablet by mouth daily. 90 tablet 0  . buPROPion (WELLBUTRIN XL) 300 MG 24 hr tablet Take 1 tablet (300 mg total) by mouth daily. 90 tablet 0  . clindamycin-benzoyl peroxide (BENZACLIN) gel Apply to affected areas bid prn 50 g 3  . esomeprazole (NEXIUM) 20 MG capsule Take 20 mg by mouth 2  (two) times daily before a meal.     . fluticasone (FLONASE) 50 MCG/ACT nasal spray Place 2 sprays into both nostrils daily. 16 g 6  . hyoscyamine (LEVSIN SL) 0.125 MG SL tablet Place 1 tablet (0.125 mg total) under the tongue every 4 (four) hours as needed for cramping. 120 tablet 11  . rosuvastatin (CRESTOR) 40 MG tablet Take 1 tablet (40 mg total) by mouth once a week. 4 tablet 12  . tamsulosin (FLOMAX) 0.4 MG CAPS capsule 1 tab po qd 90 capsule 0  . valACYclovir (VALTREX) 500 MG tablet Take 1 tablet (500 mg total) by mouth daily. 30 tablet 12  . doxycycline (VIBRA-TABS) 100 MG tablet Take 1 tablet (100 mg total) by mouth 2 (two) times daily. (Patient not taking: Reported on 10/23/2016) 20 tablet 0  . ondansetron (ZOFRAN) 4 MG tablet Take 1 tablet (4 mg total) by mouth every 8 (eight) hours as needed for nausea or vomiting. (Patient not taking: Reported on 10/23/2016) 20 tablet 0  . pantoprazole (PROTONIX) 40 MG tablet Take 1 tablet (40 mg total) by mouth daily. (Patient not taking: Reported on 04/28/2016) 90 tablet 3  . Probiotic Product (ALIGN) 4 MG CAPS 1 cap daily (Patient not taking: Reported on 04/28/2016) 30 capsule 1  . traMADol (ULTRAM) 50 MG tablet Take 1 tablet (50 mg total) by mouth every 6 (six) hours as needed for moderate pain. (Patient not taking: Reported on 10/23/2016) 30 tablet 1  . triamcinolone cream (KENALOG) 0.1 % Apply 1 application topically 2 (two) times daily. (Patient not taking: Reported on 10/23/2016) 30 g 0   No facility-administered medications prior to visit.     Allergies  Allergen Reactions  . Penicillins Other (See Comments)    Childhood allergy  . Pepto-Bismol [Bismuth Subsalicylate] Swelling    ROS Review of Systems  Constitutional: Negative for appetite change, chills, fatigue and fever.  HENT: Negative for congestion, dental problem, ear pain and sore throat.   Eyes: Negative for discharge, redness and visual disturbance.  Respiratory: Negative for  cough, chest tightness, shortness of breath and wheezing.   Cardiovascular: Negative for chest pain, palpitations and leg swelling.  Gastrointestinal: Negative for abdominal pain, blood in stool, diarrhea, nausea and vomiting.  Genitourinary: Negative for difficulty urinating, dysuria, flank pain, frequency, hematuria and urgency.  Musculoskeletal: Negative for arthralgias, back pain, joint swelling, myalgias and neck stiffness.  Skin: Negative for pallor and rash.  Neurological: Positive for headaches (chronic daily). Negative for dizziness, speech difficulty and weakness.  Hematological: Negative for adenopathy. Does not bruise/bleed easily.  Psychiatric/Behavioral: Negative  for confusion and sleep disturbance. The patient is not nervous/anxious.     PE; Blood pressure 130/83, pulse 70, temperature 97.9 F (36.6 C), temperature source Oral, resp. rate 16, height 5\' 9"  (1.753 m), weight 195 lb 8 oz (88.7 kg), SpO2 98 %. Gen: Alert, well appearing.  Patient is oriented to person, place, time, and situation. AFFECT: pleasant, lucid thought and speech. ENT: Ears: EACs clear, normal epithelium.  TMs with good light reflex and landmarks bilaterally.  Eyes: no injection, icteris, swelling, or exudate.  EOMI, PERRLA. Nose: no drainage or turbinate edema/swelling.  No injection or focal lesion.  Mouth: lips without lesion/swelling.  Oral mucosa pink and moist.  Dentition intact and without obvious caries or gingival swelling.  Oropharynx without erythema, exudate, or swelling.  Neck: supple/nontender.  No LAD, mass, or TM.  Carotid pulses 2+ bilaterally, without bruits. CV: RRR, no m/r/g.   LUNGS: CTA bilat, nonlabored resps, good aeration in all lung fields. ABD: soft, NT, ND, BS normal.  No hepatospenomegaly or mass.  No bruits. EXT: no clubbing, cyanosis, or edema.  Musculoskeletal: no joint swelling, erythema, warmth, or tenderness.  ROM of all joints intact. Skin - no sores or suspicious  lesions or rashes or color changes   Pertinent labs:  Lab Results  Component Value Date   TSH 1.18 07/26/2015   Lab Results  Component Value Date   WBC 10.5 08/06/2015   HGB 15.2 08/06/2015   HCT 43.2 08/06/2015   MCV 87.4 08/06/2015   PLT 341 08/06/2015   Lab Results  Component Value Date   CREATININE 1.32 07/26/2015   BUN 16 07/26/2015   NA 141 07/26/2015   K 4.2 07/26/2015   CL 103 07/26/2015   CO2 30 07/26/2015   Lab Results  Component Value Date   ALT 26 07/26/2015   AST 20 07/26/2015   ALKPHOS 51 07/26/2015   BILITOT 0.8 07/26/2015   Lab Results  Component Value Date   CHOL 217 (H) 07/26/2015   Lab Results  Component Value Date   HDL 42.10 07/26/2015   Lab Results  Component Value Date   LDLCALC 117 (H) 12/12/2013   Lab Results  Component Value Date   TRIG 309.0 (H) 07/26/2015   Lab Results  Component Value Date   CHOLHDL 5 07/26/2015   Lab Results  Component Value Date   PSA 0.46 06/18/2014   ASSESSMENT AND PLAN:   Health maintenance exam: Reviewed age and gender appropriate health maintenance issues (prudent diet, regular exercise, health risks of tobacco and excessive alcohol, use of seatbelts, fire alarms in home, use of sunscreen).  Also reviewed age and gender appropriate health screening as well as vaccine recommendations. No vaccines due. Fasting HP labs drawn today. Colon ca screening/hx of adenomatous polyp: next colonoscopy due 09/2020.  Pain mgmt: check urine tox screen today.  Controlled substance contract signed and in chart today. Tramadol 50mg , 1 tab qid prn, #90, RF x 1.  An After Visit Summary was printed and given to the patient.  FOLLOW UP:  Return in about 4 months (around 02/23/2017) for f/u HAs/med.  Signed:  Crissie Sickles, MD           10/23/2016

## 2016-10-23 NOTE — Patient Instructions (Signed)
 Health Maintenance, Male A healthy lifestyle and preventive care is important for your health and wellness. Ask your health care provider about what schedule of regular examinations is right for you. What should I know about weight and diet?  Eat a Healthy Diet  Eat plenty of vegetables, fruits, whole grains, low-fat dairy products, and lean protein.  Do not eat a lot of foods high in solid fats, added sugars, or salt. Maintain a Healthy Weight  Regular exercise can help you achieve or maintain a healthy weight. You should:  Do at least 150 minutes of exercise each week. The exercise should increase your heart rate and make you sweat (moderate-intensity exercise).  Do strength-training exercises at least twice a week. Watch Your Levels of Cholesterol and Blood Lipids  Have your blood tested for lipids and cholesterol every 5 years starting at 47 years of age. If you are at high risk for heart disease, you should start having your blood tested when you are 47 years old. You may need to have your cholesterol levels checked more often if:  Your lipid or cholesterol levels are high.  You are older than 47 years of age.  You are at high risk for heart disease. What should I know about cancer screening? Many types of cancers can be detected early and may often be prevented. Lung Cancer  You should be screened every year for lung cancer if:  You are a current smoker who has smoked for at least 30 years.  You are a former smoker who has quit within the past 15 years.  Talk to your health care provider about your screening options, when you should start screening, and how often you should be screened. Colorectal Cancer  Routine colorectal cancer screening usually begins at 47 years of age and should be repeated every 5-10 years until you are 47 years old. You may need to be screened more often if early forms of precancerous polyps or small growths are found. Your health care provider  may recommend screening at an earlier age if you have risk factors for colon cancer.  Your health care provider may recommend using home test kits to check for hidden blood in the stool.  A small camera at the end of a tube can be used to examine your colon (sigmoidoscopy or colonoscopy). This checks for the earliest forms of colorectal cancer. Prostate and Testicular Cancer  Depending on your age and overall health, your health care provider may do certain tests to screen for prostate and testicular cancer.  Talk to your health care provider about any symptoms or concerns you have about testicular or prostate cancer. Skin Cancer  Check your skin from head to toe regularly.  Tell your health care provider about any new moles or changes in moles, especially if:  There is a change in a mole's size, shape, or color.  You have a mole that is larger than a pencil eraser.  Always use sunscreen. Apply sunscreen liberally and repeat throughout the day.  Protect yourself by wearing long sleeves, pants, a wide-brimmed hat, and sunglasses when outside. What should I know about heart disease, diabetes, and high blood pressure?  If you are 18-39 years of age, have your blood pressure checked every 3-5 years. If you are 40 years of age or older, have your blood pressure checked every year. You should have your blood pressure measured twice-once when you are at a hospital or clinic, and once when you are not at   a hospital or clinic. Record the average of the two measurements. To check your blood pressure when you are not at a hospital or clinic, you can use:  An automated blood pressure machine at a pharmacy.  A home blood pressure monitor.  Talk to your health care provider about your target blood pressure.  If you are between 45-79 years old, ask your health care provider if you should take aspirin to prevent heart disease.  Have regular diabetes screenings by checking your fasting blood sugar  level.  If you are at a normal weight and have a low risk for diabetes, have this test once every three years after the age of 45.  If you are overweight and have a high risk for diabetes, consider being tested at a younger age or more often.  A one-time screening for abdominal aortic aneurysm (AAA) by ultrasound is recommended for men aged 65-75 years who are current or former smokers. What should I know about preventing infection? Hepatitis B  If you have a higher risk for hepatitis B, you should be screened for this virus. Talk with your health care provider to find out if you are at risk for hepatitis B infection. Hepatitis C  Blood testing is recommended for:  Everyone born from 1945 through 1965.  Anyone with known risk factors for hepatitis C. Sexually Transmitted Diseases (STDs)  You should be screened each year for STDs including gonorrhea and chlamydia if:  You are sexually active and are younger than 47 years of age.  You are older than 47 years of age and your health care provider tells you that you are at risk for this type of infection.  Your sexual activity has changed since you were last screened and you are at an increased risk for chlamydia or gonorrhea. Ask your health care provider if you are at risk.  Talk with your health care provider about whether you are at high risk of being infected with HIV. Your health care provider may recommend a prescription medicine to help prevent HIV infection. What else can I do?  Schedule regular health, dental, and eye exams.  Stay current with your vaccines (immunizations).  Do not use any tobacco products, such as cigarettes, chewing tobacco, and e-cigarettes. If you need help quitting, ask your health care provider.  Limit alcohol intake to no more than 2 drinks per day. One drink equals 12 ounces of beer, 5 ounces of wine, or 1 ounces of hard liquor.  Do not use street drugs.  Do not share needles.  Ask your health  care provider for help if you need support or information about quitting drugs.  Tell your health care provider if you often feel depressed.  Tell your health care provider if you have ever been abused or do not feel safe at home. This information is not intended to replace advice given to you by your health care provider. Make sure you discuss any questions you have with your health care provider. Document Released: 11/14/2007 Document Revised: 01/15/2016 Document Reviewed: 02/19/2015 Elsevier Interactive Patient Education  2017 [employer]  

## 2016-10-23 NOTE — Telephone Encounter (Signed)
Apt for today (10/23/16) for CPE at 10:00am.

## 2016-10-27 ENCOUNTER — Encounter: Payer: Self-pay | Admitting: *Deleted

## 2016-11-30 ENCOUNTER — Telehealth: Payer: Self-pay | Admitting: Family Medicine

## 2016-11-30 NOTE — Telephone Encounter (Signed)
Patient has a question about Nexum Rx. He wants to change it. Please call him.

## 2016-11-30 NOTE — Telephone Encounter (Signed)
Patient requesting to switch to Aciphex. Aware that MD is out of office until 12/07/16.

## 2016-12-02 ENCOUNTER — Other Ambulatory Visit: Payer: Self-pay | Admitting: Family Medicine

## 2016-12-06 MED ORDER — RABEPRAZOLE SODIUM 20 MG PO TBEC: 20.0000 mg | DELAYED_RELEASE_TABLET | Freq: Every day | ORAL | 3 refills | Status: DC

## 2016-12-06 NOTE — Telephone Encounter (Signed)
Aciphex eRx'd as per pt request.

## 2016-12-07 NOTE — Telephone Encounter (Signed)
Patient notified of medication called into pharmacy 

## 2016-12-30 ENCOUNTER — Other Ambulatory Visit: Payer: Self-pay | Admitting: Family Medicine

## 2016-12-30 ENCOUNTER — Ambulatory Visit (INDEPENDENT_AMBULATORY_CARE_PROVIDER_SITE_OTHER): Payer: BLUE CROSS/BLUE SHIELD | Admitting: Family Medicine

## 2016-12-30 ENCOUNTER — Encounter: Payer: Self-pay | Admitting: Family Medicine

## 2016-12-30 VITALS — BP 116/79 | HR 88 | Temp 98.4°F | Resp 16 | Ht 69.0 in | Wt 194.5 lb

## 2016-12-30 DIAGNOSIS — J0101 Acute recurrent maxillary sinusitis: Secondary | ICD-10-CM

## 2016-12-30 DIAGNOSIS — J029 Acute pharyngitis, unspecified: Secondary | ICD-10-CM | POA: Diagnosis not present

## 2016-12-30 DIAGNOSIS — E785 Hyperlipidemia, unspecified: Secondary | ICD-10-CM

## 2016-12-30 LAB — POCT RAPID STREP A (OFFICE): Rapid Strep A Screen: NEGATIVE

## 2016-12-30 MED ORDER — CEFDINIR 300 MG PO CAPS: 300.0000 mg | ORAL_CAPSULE | Freq: Two times a day (BID) | ORAL | 0 refills | Status: DC

## 2016-12-30 NOTE — Patient Instructions (Signed)
Try adding mucinex nasal spray for 10-14d. Use saline nasal spray several times a day to moisturize and irrigate nasal passages.

## 2016-12-30 NOTE — Progress Notes (Signed)
OFFICE VISIT  12/30/2016   CC:  Chief Complaint  Patient presents with  . Sore Throat    with body aches x 1 day   HPI:    Patient is a 46 y.o. Caucasian male who presents for sore throat. Onset 24-36 hours ago: ST, having achy muscles in neck/tops of traps, thighs--symmetric. Has bad HA but has chronic HAs. +Sinus/nasal congestion, without anything coming out of nose.  The "sinus" sx's have been present for a week or so. No cough, wheeze, or SOB.  No fevers.  No rash.   Past Medical History:  Diagnosis Date  . Adenomatous colon polyp 10/04/15  . ALLERGIC RHINITIS 07/11/2008  . Allergy    SEASONAL  . 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 Anxiety   . BACK PAIN, CHRONIC, INTERMITTENT 10/17/2008  . Barrett's esophagus determined by biopsy 04/12/14  . BENIGN PROSTATIC HYPERTROPHY, WITH URINARY OBSTRUCTION 07/21/2007  . BURSITIS, HIP 09/30/2009  . CAP (community acquired pneumonia) 06/2014  . Cataracts, bilateral 2016   Not visually significant  . Chronic headaches    tramadol  . Chronic renal insufficiency, stage II (mild)    GFR 60s-70s  . Diverticulitis 2016   CT 01/2015  . Gastric polyp 2006  . GERD without esophagitis   . Herpes labialis   . Hiatal hernia   . Hyperlipidemia   . HYPERTENSION 01/05/2007  . PSORIASIS 01/05/2007    Past Surgical History:  Procedure Laterality Date  . COLONOSCOPY W/ POLYPECTOMY  10/04/15   66mm tubular adenoma + diverticulosis: recall 5 yrs (Dr. Fuller Plan)  . ESOPHAGOGASTRODUODENOSCOPY  04/12/14   Barrett's esophagus  . LIPOMA EXCISION     several, arm    Outpatient Medications Prior to Visit  Medication Sig Dispense Refill  . acetaminophen (TYLENOL) 500 MG tablet Take 500 mg by mouth every 6 (six) hours as needed.    . bisoprolol-hydrochlorothiazide (ZIAC) 5-6.25 MG tablet TAKE 1 TABLET BY MOUTH DAILY. 90 tablet 0  . buPROPion (WELLBUTRIN XL) 300 MG 24 hr tablet Take 1 tablet (300 mg total) by mouth daily. 90 tablet 0  . clindamycin-benzoyl  peroxide (BENZACLIN) gel Apply to affected areas bid prn 50 g 3  . fluticasone (FLONASE) 50 MCG/ACT nasal spray Place 2 sprays into both nostrils daily. 16 g 6  . hyoscyamine (LEVSIN SL) 0.125 MG SL tablet Place 1 tablet (0.125 mg total) under the tongue every 4 (four) hours as needed for cramping. 120 tablet 11  . RABEprazole (ACIPHEX) 20 MG tablet Take 1 tablet (20 mg total) by mouth daily. 90 tablet 3  . rosuvastatin (CRESTOR) 40 MG tablet TAKE 1 TABLET BY MOUTH ONCE A WEEK. 4 tablet 12  . tamsulosin (FLOMAX) 0.4 MG CAPS capsule 1 tab po qd 90 capsule 0  . traMADol (ULTRAM) 50 MG tablet Take 1 tablet (50 mg total) by mouth every 6 (six) hours as needed for moderate pain. 90 tablet 1  . valACYclovir (VALTREX) 500 MG tablet Take 1 tablet (500 mg total) by mouth daily. 30 tablet 12   No facility-administered medications prior to visit.     Allergies  Allergen Reactions  . Penicillins Other (See Comments)    Childhood allergy  . Pepto-Bismol [Bismuth Subsalicylate] Swelling    ROS As per HPI  PE: Blood pressure 116/79, pulse 88, temperature 98.4 F (36.9 C), temperature source Oral, resp. rate 16, height 5\' 9"  (1.753 m), weight 194 lb 8 oz (88.2 kg), SpO2 99 %. VS: noted--normal. Gen: alert,  NAD, NONTOXIC APPEARING. HEENT: eyes without injection, drainage, or swelling.  Ears: EACs clear, TMs with normal light reflex and landmarks.  Nose: Clear rhinorrhea, with some dried, crusty exudate adherent to mildly injected and edematous mucosa.  No purulent d/c.  Mild/mod bilat upper maxillary region and bilat frontal sinus TTP.  No facial swelling.  Throat and mouth without focal lesion.  Mild soft palate erythema and swelling.  No pus.  I cannot see any tonsillar tissue.  Minimal PND noted--clear.  Neck: supple, no LAD.  Mild TTP of paraspinous muscles and trapezius mm's bilat.   LUNGS: CTA bilat, nonlabored resps.   CV: RRR, no m/r/g. EXT: no c/c/e SKIN: no rash Mild bilat thigh  TTP.   LABS:  Rapid strep today: NEG  IMPRESSION AND PLAN:  1) Acute sinusitis suspected.  Likely PND pharyngitis. Diffuse muscle aches could certainly signal more of a viral process, but I feel like antibiotic tx for sinusitis is indicated at this time. Cefdinir 300 mg bid x 10d.  (penicillin allergy listed in chart is only a family hx of penicillin allergy--pt has never had a penicillin rx'd.  No hx of reaction to cephalosporins in the past). Instructions: Try adding mucinex nasal spray for 10-14d. Use saline nasal spray several times a day to moisturize and irrigate nasal passages. Tylenol or motrin q6h with food for pain. Sent group A strep culture today.  An After Visit Summary was printed and given to the patient.  FOLLOW UP: Return if symptoms worsen or fail to improve.  Signed:  Crissie Sickles, MD           12/30/2016

## 2017-01-01 LAB — CULTURE, GROUP A STREP

## 2017-01-11 ENCOUNTER — Other Ambulatory Visit: Payer: Self-pay | Admitting: Family Medicine

## 2017-01-11 NOTE — Telephone Encounter (Signed)
[facility] [place]

## 2017-01-18 ENCOUNTER — Other Ambulatory Visit: Payer: Self-pay | Admitting: Family Medicine

## 2017-01-18 DIAGNOSIS — D1721 Benign lipomatous neoplasm of skin and subcutaneous tissue of right arm: Secondary | ICD-10-CM | POA: Diagnosis not present

## 2017-01-18 DIAGNOSIS — D1722 Benign lipomatous neoplasm of skin and subcutaneous tissue of left arm: Secondary | ICD-10-CM | POA: Diagnosis not present

## 2017-01-18 DIAGNOSIS — D2271 Melanocytic nevi of right lower limb, including hip: Secondary | ICD-10-CM | POA: Diagnosis not present

## 2017-01-18 NOTE — Telephone Encounter (Signed)
[facility]

## 2017-03-05 ENCOUNTER — Other Ambulatory Visit: Payer: Self-pay | Admitting: Family Medicine

## 2017-03-11 ENCOUNTER — Emergency Department (HOSPITAL_COMMUNITY)
Admission: EM | Admit: 2017-03-11 | Discharge: 2017-03-11 | Disposition: A | Discharge status: Home / Self Care | Payer: BLUE CROSS/BLUE SHIELD | Attending: Emergency Medicine | Admitting: Emergency Medicine

## 2017-03-11 ENCOUNTER — Emergency Department (HOSPITAL_COMMUNITY): Payer: BLUE CROSS/BLUE SHIELD

## 2017-03-11 ENCOUNTER — Encounter (HOSPITAL_COMMUNITY): Payer: Self-pay | Admitting: Emergency Medicine

## 2017-03-11 DIAGNOSIS — G43909 Migraine, unspecified, not intractable, without status migrainosus: Secondary | ICD-10-CM | POA: Insufficient documentation

## 2017-03-11 DIAGNOSIS — R079 Chest pain, unspecified: Secondary | ICD-10-CM | POA: Diagnosis not present

## 2017-03-11 DIAGNOSIS — G43009 Migraine without aura, not intractable, without status migrainosus: Secondary | ICD-10-CM | POA: Diagnosis not present

## 2017-03-11 DIAGNOSIS — R9431 Abnormal electrocardiogram [ECG] [EKG]: Secondary | ICD-10-CM | POA: Diagnosis not present

## 2017-03-11 DIAGNOSIS — R0789 Other chest pain: Secondary | ICD-10-CM | POA: Diagnosis not present

## 2017-03-11 DIAGNOSIS — Z79899 Other long term (current) drug therapy: Secondary | ICD-10-CM | POA: Insufficient documentation

## 2017-03-11 DIAGNOSIS — R51 Headache: Secondary | ICD-10-CM | POA: Diagnosis not present

## 2017-03-11 LAB — CBC
HCT: 43.4 % (ref 39.0–52.0)
Hemoglobin: 15.1 g/dL (ref 13.0–17.0)
MCH: 31.1 pg (ref 26.0–34.0)
MCHC: 34.8 g/dL (ref 30.0–36.0)
MCV: 89.5 fL (ref 78.0–100.0)
Platelets: 301 10*3/uL (ref 150–400)
RBC: 4.85 MIL/uL (ref 4.22–5.81)
RDW: 12.5 % (ref 11.5–15.5)
WBC: 7.9 10*3/uL (ref 4.0–10.5)

## 2017-03-11 LAB — BASIC METABOLIC PANEL
Anion gap: 12 (ref 5–15)
BUN: 13 mg/dL (ref 6–20)
CO2: 24 mmol/L (ref 22–32)
Calcium: 9.5 mg/dL (ref 8.9–10.3)
Chloride: 103 mmol/L (ref 101–111)
Creatinine, Ser: 1.21 mg/dL (ref 0.61–1.24)
GFR calc Af Amer: >60 mL/min (ref >60)
GFR calc non Af Amer: >60 mL/min (ref >60)
Glucose, Bld: 105 mg/dL — ABNORMAL HIGH (ref 65–99)
Potassium: 3.6 mmol/L (ref 3.5–5.1)
Sodium: 139 mmol/L (ref 135–145)

## 2017-03-11 LAB — I-STAT TROPONIN, ED: Troponin i, poc: 0 ng/mL (ref 0.00–0.08)

## 2017-03-11 MED ORDER — DIPHENHYDRAMINE HCL 50 MG/ML IJ SOLN
25.0000 mg | Freq: Once | INTRAMUSCULAR | Status: DC
  Filled 2017-03-11: qty 1

## 2017-03-11 MED ORDER — PROCHLORPERAZINE EDISYLATE 5 MG/ML IJ SOLN
10.0000 mg | Freq: Once | INTRAMUSCULAR | Status: AC
  Administered 2017-03-11: 10 mg via INTRAVENOUS
  Filled 2017-03-11: qty 2

## 2017-03-11 MED ORDER — BUTALBITAL-APAP-CAFFEINE 50-325-40 MG PO TABS: 1.0000 | ORAL_TABLET | Freq: Four times a day (QID) | ORAL | 0 refills | Status: DC | PRN

## 2017-03-11 MED ORDER — DEXAMETHASONE SODIUM PHOSPHATE 10 MG/ML IJ SOLN
10.0000 mg | Freq: Once | INTRAMUSCULAR | Status: AC
  Administered 2017-03-11: 10 mg via INTRAVENOUS
  Filled 2017-03-11: qty 1

## 2017-03-11 MED ORDER — DIPHENHYDRAMINE HCL 50 MG/ML IJ SOLN
25.0000 mg | Freq: Once | INTRAMUSCULAR | Status: AC
  Administered 2017-03-11: 25 mg via INTRAVENOUS
  Filled 2017-03-11: qty 1

## 2017-03-11 MED ORDER — LORAZEPAM 2 MG/ML IJ SOLN: 1.0000 mg | Freq: Once | INTRAMUSCULAR | Status: DC

## 2017-03-11 MED ORDER — KETOROLAC TROMETHAMINE 30 MG/ML IJ SOLN
30.0000 mg | Freq: Once | INTRAMUSCULAR | Status: AC
  Administered 2017-03-11: 30 mg via INTRAVENOUS
  Filled 2017-03-11: qty 1

## 2017-03-11 NOTE — ED Triage Notes (Addendum)
Pt complaint of severe headache onset 2000 last night; new onset generalized chest tightness 1.5 hours ago; denies weakness/numbness. Associated nausea.

## 2017-03-11 NOTE — ED Provider Notes (Signed)
[facility] DEPT Provider Note   CSN: [id number] Arrival date & time: 03/11/17  1744     History   Chief Complaint Chief Complaint  Patient presents with  . Headache  . Chest Pain    HPI Benjamin [name] is a 46 y.o. male.  HPI  Pt has a history of recurrent headaches.  He has never been formally diagnosed with any specific type of headache condition.  Last night the headache occurred but eventually went away.  This morning about 10 am it started again. He tried taking medications at home without relief.  He has never had a headache that required an ED visit.  No fever.  No vomiting.  He does have nausea, and the light makes it worse.  He also started having chest tightness about 1.5 hours ago.  It is all the way across the chest.  No dyspnea.  No cough.  No history of heart disease.  No family history of CAD.    Past Medical History:  Diagnosis Date  . Adenomatous colon polyp 10/04/15  . ALLERGIC RHINITIS 07/11/2008  . Allergy    SEASONAL  . 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 Anxiety   . BACK PAIN, CHRONIC, INTERMITTENT 10/17/2008  . Barrett's esophagus determined by biopsy 04/12/14  . BENIGN PROSTATIC HYPERTROPHY, WITH URINARY OBSTRUCTION 07/21/2007  . BURSITIS, HIP 09/30/2009  . CAP (community acquired pneumonia) 06/2014  . Cataracts, bilateral 2016   Not visually significant  . Chronic headaches    tramadol  . Chronic renal insufficiency, stage II (mild)    GFR 60s-70s  . Diverticulitis 2016   CT 01/2015  . Gastric polyp 2006  . GERD without esophagitis   . Herpes labialis   . Hiatal hernia   . Hyperlipidemia   . HYPERTENSION 01/05/2007  . PSORIASIS 01/05/2007    Patient Active Problem List   Diagnosis Date Noted  . Urinary frequency 09/02/2015  . Rash and nonspecific skin eruption 09/02/2015  . LLQ abdominal pain 08/09/2015  . History of diverticulitis 08/09/2015  . LLQ pain 08/06/2015  . Nausea without vomiting 08/06/2015  . Diverticulitis of colon  07/02/2015  . BMI 26.0-26.9,adult 09/17/2014  . Health maintenance examination 06/28/2014  . Adjustment disorder with mixed anxiety and depressed mood 02/19/2014  . Chronic cluster headache, not intractable 02/19/2014  . Herpes labialis 05/17/2012  . BURSITIS, HIP 09/30/2009  . BACK PAIN, CHRONIC, INTERMITTENT 10/17/2008  . ALLERGIC RHINITIS 07/11/2008  . BPH (benign prostatic hypertrophy) with urinary obstruction 07/21/2007  . Essential hypertension 01/05/2007  . PSORIASIS 01/05/2007    Past Surgical History:  Procedure Laterality Date  . COLONOSCOPY W/ POLYPECTOMY  10/04/15   68mm tubular adenoma + diverticulosis: recall 5 yrs (Dr. Fuller Plan)  . ESOPHAGOGASTRODUODENOSCOPY  04/12/14   Barrett's esophagus  . LIPOMA EXCISION     several, arm       Home Medications    Prior to Admission medications   Medication Sig Start Date End Date Taking? Authorizing Provider  bifidobacterium infantis (ALIGN) capsule Take 1 capsule by mouth daily.   Yes [provider]  bisoprolol-hydrochlorothiazide (ZIAC) 5-6.25 MG tablet TAKE 1 TABLET BY MOUTH DAILY. 03/05/17  Yes McGowen, Adrian Blackwater, MD  buPROPion (WELLBUTRIN XL) 300 MG 24 hr tablet Take 1 tablet (300 mg total) by mouth daily. 01/11/17  Yes McGowen, Adrian Blackwater, MD  fluticasone (FLONASE) 50 MCG/ACT nasal spray Place 2 sprays into both nostrils daily. 09/11/14  Yes McGowen, Adrian Blackwater, MD  RABEprazole ([place])  20 MG tablet Take 1 tablet (20 mg total) by mouth daily. 12/06/16  Yes McGowen, Adrian Blackwater, MD  rosuvastatin (CRESTOR) 40 MG tablet TAKE 1 TABLET BY MOUTH ONCE A WEEK. 12/30/16  Yes McGowen, Adrian Blackwater, MD  tamsulosin (FLOMAX) 0.4 MG CAPS capsule TAKE 1 CAPSULE BY MOUTH EVERY DAY 01/18/17  Yes McGowen, Adrian Blackwater, MD  traMADol (ULTRAM) 50 MG tablet Take 1 tablet (50 mg total) by mouth every 6 (six) hours as needed for moderate pain. 10/23/16  Yes McGowen, Adrian Blackwater, MD  valACYclovir (VALTREX) 500 MG tablet Take 1 tablet (500 mg total) by mouth daily.  08/05/16  Yes McGowen, Adrian Blackwater, MD  butalbital-acetaminophen-caffeine (FIORICET, ESGIC) 413 297 2082 MG tablet Take 1-2 tablets by mouth every 6 (six) hours as needed for headache. 03/11/17 03/11/18  Dorie Rank, MD  cefdinir (OMNICEF) 300 MG capsule Take 1 capsule (300 mg total) by mouth 2 (two) times daily. Patient not taking: Reported on 03/11/2017 12/30/16   Tammi Sou, MD  clindamycin-benzoyl peroxide [facility]) gel Apply to affected areas bid prn Patient not taking: Reported on 03/11/2017 02/13/16   McGowen, Adrian Blackwater, MD  hyoscyamine (LEVSIN SL) 0.125 MG SL tablet Place 1 tablet (0.125 mg total) under the tongue every 4 (four) hours as needed for cramping. Patient not taking: Reported on 03/11/2017 10/04/15   Ladene Artist, MD    Family History Family History  Problem Relation Age of Onset  . CAD Father        around age 91  . Heart disease Father   . Hyperlipidemia Father   . Hypertension Father   . CAD Paternal Grandmother   . Diabetes Paternal Grandmother   . Hypertension Mother   . Hypertension Brother   . Hyperlipidemia Brother   . Lung cancer Maternal Grandmother   . Cancer Maternal Grandfather        type unknown  . Colon cancer Unknown        neg hx  . Prostate cancer Unknown        neg hx    Social History Social History  Substance Use Topics  . Smoking status: Never Smoker  . Smokeless tobacco: Never Used  . Alcohol use 1.2 oz/week    2 Cans of beer per week     Allergies   Penicillins and Pepto-bismol [bismuth subsalicylate]   Review of Systems Review of Systems  All other systems reviewed and are negative.    Physical Exam Updated Vital Signs BP (!) 159/110 (BP Location: Left Arm)   Pulse (!) 120   Temp 98.9 F (37.2 C) (Oral)   Resp 18   Ht 1.753 m (5\' 9" )   Wt 86.2 kg (190 lb)   SpO2 99%   BMI 28.06 kg/m   Physical Exam  Constitutional: He appears well-developed and well-nourished. No distress.  HENT:  Head: Normocephalic and  atraumatic.  Right Ear: External ear normal.  Left Ear: External ear normal.  Eyes: Conjunctivae are normal. Right eye exhibits no discharge. Left eye exhibits no discharge. No scleral icterus.  Neck: Neck supple. No tracheal deviation present.  Cardiovascular: Normal rate, regular rhythm and intact distal pulses.   Pulmonary/Chest: Effort normal and breath sounds normal. No stridor. No respiratory distress. He has no wheezes. He has no rales.  Abdominal: Soft. Bowel sounds are normal. He exhibits no distension. There is no tenderness. There is no rebound and no guarding.  Musculoskeletal: He exhibits no edema or tenderness.  Neurological: He is alert. He  has normal strength. No cranial nerve deficit (no facial droop, extraocular movements intact, no slurred speech) or sensory deficit. He exhibits normal muscle tone. He displays no seizure activity. Coordination normal.  Skin: Skin is warm and dry. No rash noted.  Psychiatric: He has a normal mood and affect.  Nursing note and vitals reviewed.    ED Treatments / Results  Labs (all labs ordered are listed, but only abnormal results are displayed) Labs Reviewed  BASIC METABOLIC PANEL - Abnormal; Notable for the following:       Result Value   Glucose, Bld 105 (*)    All other components within normal limits  CBC  I-STAT TROPONIN, ED    EKG  EKG Interpretation  Date/Time:  Thursday March 11 2017 17:50:37 EDT Ventricular Rate:  96 PR Interval:    QRS Duration: 103 QT Interval:  329 QTC Calculation: 416 R Axis:   87 Text Interpretation:  Sinus rhythm Borderline repolarization abnormality Inferolateral st t changes Baseline wander in lead(s) V4 V5 No old tracing to compare Confirmed by Dorie Rank [phone]) on 03/11/2017 5:53:39 PM       Radiology Dg Chest 2 View  Result Date: 03/11/2017 CLINICAL DATA:  Severe headaches since 2000 hours last night, onset of chest tightness 1.5 hours ago, nausea, history hypertension, hiatal  hernia EXAM: CHEST  2 VIEW COMPARISON:  06/10/2014 FINDINGS: Normal heart size, mediastinal contours, and pulmonary vascularity. Lungs clear. No pleural effusion or pneumothorax. Bones unremarkable. IMPRESSION: Normal exam. Electronically Signed   By: Lavonia Dana M.D.   On: 03/11/2017 18:30    Procedures Procedures (including critical care time)  Medications Ordered in ED Medications  diphenhydrAMINE (BENADRYL) injection 25 mg (0 mg Intravenous Hold 03/11/17 1911)  LORazepam (ATIVAN) injection 1 mg (0 mg Intravenous Hold 03/11/17 1919)  prochlorperazine (COMPAZINE) injection 10 mg (10 mg Intravenous Given 03/11/17 1832)  ketorolac (TORADOL) 30 MG/ML injection 30 mg (30 mg Intravenous Given 03/11/17 1832)  diphenhydrAMINE (BENADRYL) injection 25 mg (25 mg Intravenous Given 03/11/17 1832)  dexamethasone (DECADRON) injection 10 mg (10 mg Intravenous Given 03/11/17 1911)     Initial Impression / Assessment and Plan / ED Course  I have reviewed the triage vital signs and the nursing notes.  Pertinent labs & imaging results that were available during my care of the patient were reviewed by me and considered in my medical decision making (see chart for details).  Clinical Course as of Mar 11 1953  Thu Mar 11, 2017  1910 Pt became agitated after Compazine.  I re examined.  Lungs are clear.  No oral edema.  Suspect akisthesia.  Will give additional benadryl.  Steroids although less likely allergic reaction  [JK]  1932 Sx have resolved.  HA is  much better now.  [JK]    Clinical Course User Index [JK] Dorie Rank, MD    Patient presented to the emergency room for evaluation of headache. Patient's symptoms are suggestive of a migraine headache. Patient also had some trouble with chest discomfort after the onset of his headaches. He is low risk for heart disease. EKG and cardiac enzymes are reassuring.  Patient had a neck fusion reaction to the Compazine but that resolved without further  medications. Patient is feeling better and is ready for discharge  Final Clinical Impressions(s) / ED Diagnoses   Final diagnoses:  Migraine without status migrainosus, not intractable, unspecified migraine type    New Prescriptions New Prescriptions   BUTALBITAL-ACETAMINOPHEN-CAFFEINE (FIORICET, ESGIC) 50-325-40 MG TABLET  Take 1-2 tablets by mouth every 6 (six) hours as needed for headache.     Dorie Rank, MD 03/11/17 [phone]

## 2017-03-12 ENCOUNTER — Telehealth: Payer: Self-pay | Admitting: Family Medicine

## 2017-03-12 DIAGNOSIS — R519 Headache, unspecified: Secondary | ICD-10-CM

## 2017-03-12 DIAGNOSIS — G8929 Other chronic pain: Secondary | ICD-10-CM

## 2017-03-12 DIAGNOSIS — R51 Headache: Principal | ICD-10-CM

## 2017-03-12 NOTE — Telephone Encounter (Signed)
Patient states he was seen at [facility] ED last night for migraine.  He states he was advised to contact pcp to request referral to neurology.

## 2017-03-12 NOTE — Telephone Encounter (Signed)
Please advise. Thanks.  

## 2017-03-13 NOTE — Telephone Encounter (Signed)
OK, referral ordered as per pt request. If the appt is going to be more than 4 weeks away, he may want to make an appt with me to see me so we can be working on this problem in the meantime.--thx

## 2017-03-15 ENCOUNTER — Encounter: Payer: Self-pay | Admitting: Neurology

## 2017-03-15 NOTE — Telephone Encounter (Signed)
Left message for pt to call back  °

## 2017-03-19 NOTE — Telephone Encounter (Signed)
Pt has apt with neurology 06/23/17 and has apt with Dr. Anitra Lauth 03/24/17.

## 2017-03-23 ENCOUNTER — Encounter: Payer: Self-pay | Admitting: Gastroenterology

## 2017-03-24 ENCOUNTER — Encounter: Payer: Self-pay | Admitting: Family Medicine

## 2017-03-24 ENCOUNTER — Ambulatory Visit (INDEPENDENT_AMBULATORY_CARE_PROVIDER_SITE_OTHER): Payer: BLUE CROSS/BLUE SHIELD | Admitting: Family Medicine

## 2017-03-24 VITALS — BP 116/80 | HR 85 | Temp 98.5°F | Resp 16 | Ht 69.0 in | Wt 194.0 lb

## 2017-03-24 DIAGNOSIS — R51 Headache: Secondary | ICD-10-CM | POA: Diagnosis not present

## 2017-03-24 DIAGNOSIS — R519 Headache, unspecified: Secondary | ICD-10-CM

## 2017-03-24 DIAGNOSIS — G43909 Migraine, unspecified, not intractable, without status migrainosus: Secondary | ICD-10-CM

## 2017-03-24 NOTE — Progress Notes (Signed)
OFFICE VISIT  03/24/2017   CC:  Chief Complaint  Patient presents with  . Follow-up    ER visit   HPI:    Patient is a 46 y.o. Caucasian male who presents for f/u emergency department visit. He went to ED 03/11/17 for HA not responsive to home med treatment.  He has a chronic HA syndrome that we had been treating mildly successfully with prn tramadol. Tried treximet back in 2014 and it did not help.  At ED, he was dx'd with non-intractable migraine HA and was d/c'd home with fioricet 50/325/40 tabs, #16.  He also had some CP at the time of HA/ED visit, EKG normal, cardiac enzymes neg, CXR normal.  He describes having a HA the day before going to ED (from temple over forehead to other temple, then around back and sides of head, intense and throbbing pain, nauseated, +photo/phonophobia, tramadol no help x a few doses.  No visual sensations/aura, no dizziness. He was given migraine cocktail: decadron, benadryl, toradol, lorazepam, and compazine. 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  HA was relieved. Since that time he is still having HA's but nothing as severe as that HA--more like his chronic daily "tension" type HA.  Took a few fioricet and they didn't help.   Continues to try tramadol and these help a little, as in the past for his chronic daily HAs.  He has tried treximet once in the past, about 4 yrs ago and recalls that it did not help at all.  The HA's come on around 7 pm or so, goes to lay down, gets up a few hours later and the HA is mildly improved.    Common triggers for him are hunger, stress.  Has new infant at home.  Says he is afraid of having meningiomas b/c his mom and GM had these.  Past Medical History:  Diagnosis Date  . Adenomatous colon polyp 10/04/15  . ALLERGIC RHINITIS 07/11/2008  . Allergy    SEASONAL  . 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 Anxiety   . BACK PAIN, CHRONIC, INTERMITTENT 10/17/2008  . Barrett's esophagus determined by biopsy  04/12/14  . BENIGN PROSTATIC HYPERTROPHY, WITH URINARY OBSTRUCTION 07/21/2007  . BURSITIS, HIP 09/30/2009  . CAP (community acquired pneumonia) 06/2014  . Cataracts, bilateral 2016   Not visually significant  . Chronic headaches    tramadol  . Chronic renal insufficiency, stage II (mild)    GFR 60s-70s  . Diverticulitis 2016   CT 01/2015  . Gastric polyp 2006  . GERD without esophagitis   . Herpes labialis   . Hiatal hernia   . Hyperlipidemia   . HYPERTENSION 01/05/2007  . PSORIASIS 01/05/2007    Past Surgical History:  Procedure Laterality Date  . COLONOSCOPY W/ POLYPECTOMY  10/04/15   77mm tubular adenoma + diverticulosis: recall 5 yrs (Dr. Fuller Plan)  . ESOPHAGOGASTRODUODENOSCOPY  04/12/14   Barrett's esophagus  . LIPOMA EXCISION     several, arm    Outpatient Medications Prior to Visit  Medication Sig Dispense Refill  . bifidobacterium infantis (ALIGN) capsule Take 1 capsule by mouth daily.    . bisoprolol-hydrochlorothiazide (ZIAC) 5-6.25 MG tablet TAKE 1 TABLET BY MOUTH DAILY. 90 tablet 1  . buPROPion (WELLBUTRIN XL) 300 MG 24 hr tablet Take 1 tablet (300 mg total) by mouth daily. 90 tablet 1  . butalbital-acetaminophen-caffeine (FIORICET, ESGIC) 50-325-40 MG tablet Take 1-2 tablets by mouth every 6 (six) hours as needed for headache. 16  tablet 0  . fluticasone (FLONASE) 50 MCG/ACT nasal spray Place 2 sprays into both nostrils daily. 16 g 6  . hyoscyamine (LEVSIN SL) 0.125 MG SL tablet Place 1 tablet (0.125 mg total) under the tongue every 4 (four) hours as needed for cramping. 120 tablet 11  . RABEprazole (ACIPHEX) 20 MG tablet Take 1 tablet (20 mg total) by mouth daily. 90 tablet 3  . rosuvastatin (CRESTOR) 40 MG tablet TAKE 1 TABLET BY MOUTH ONCE A WEEK. 4 tablet 12  . tamsulosin (FLOMAX) 0.4 MG CAPS capsule TAKE 1 CAPSULE BY MOUTH EVERY DAY 90 capsule 1  . traMADol (ULTRAM) 50 MG tablet Take 1 tablet (50 mg total) by mouth every 6 (six) hours as needed for moderate pain. 90 tablet  1  . valACYclovir (VALTREX) 500 MG tablet Take 1 tablet (500 mg total) by mouth daily. 30 tablet 12  . cefdinir (OMNICEF) 300 MG capsule Take 1 capsule (300 mg total) by mouth 2 (two) times daily. (Patient not taking: Reported on 03/11/2017) 20 capsule 0  . clindamycin-benzoyl peroxide (BENZACLIN) gel Apply to affected areas bid prn (Patient not taking: Reported on 03/11/2017) 50 g 3   No facility-administered medications prior to visit.     Allergies  Allergen Reactions  . Compazine [Prochlorperazine Edisylate] Other (See Comments)    Cervical dystonia and panic attack  . Penicillins Other (See Comments)    Childhood allergy Has patient had a PCN reaction causing immediate rash, facial/tongue/throat swelling, SOB or lightheadedness with hypotension: unknown Has patient had a PCN reaction causing severe rash involving mucus membranes or skin necrosis: Unknown Has patient had a PCN reaction that required hospitalization: Unknown Has patient had a PCN reaction occurring within the last 10 years: Unknown If all of the above answers are "NO", then may proceed with Cephalosporin use.    . Pepto-Bismol [Bismuth Subsalicylate] Swelling    ROS As per HPI  PE: Blood pressure 116/80, pulse 85, temperature 98.5 F (36.9 C), temperature source Oral, resp. rate 16, height 5\' 9"  (1.753 m), weight 194 lb (88 kg), SpO2 95 %. Gen: Alert, well appearing.  Patient is oriented to person, place, time, and situation. AFFECT: pleasant, lucid thought and speech. Neuro: CN 2-12 intact bilaterally, strength 5/5 in proximal and distal upper extremities and lower extremities bilaterally.  No sensory deficits.  No tremor.  No disdiadochokinesis.  No ataxia.  Upper extremity and lower extremity DTRs symmetric.  No pronator drift.  LABS:  none  IMPRESSION AND PLAN:  Worsening chronic daily headaches, with a recent severe migraine HA. He has had only a rare migraine type headache in the past.  He is  currently back to baseline. He is not really interested in any new med at this time.  He already has a neurology consult set up with Dr. Tomi Likens for 06/23/17, and he is on the cancellation list.  He asks for referral to another neurology group to see if he can get an appointment any earlier.  I ordered referral to [place] neurologic group today.  Spent 25 min with pt today, with >50% of this time spent in counseling and care coordination regarding the above problems.  Of note, compazine has been put on his allergy list due to cervical dystonia and panic attack.  An After Visit Summary was printed and given to the patient.  FOLLOW UP: Return for as needed.  Signed:  Crissie Sickles, MD           03/24/2017

## 2017-03-28 ENCOUNTER — Encounter (HOSPITAL_COMMUNITY): Payer: Self-pay | Admitting: *Deleted

## 2017-03-28 ENCOUNTER — Ambulatory Visit (HOSPITAL_COMMUNITY)
Admission: EM | Admit: 2017-03-28 | Discharge: 2017-03-28 | Disposition: A | Discharge status: Home / Self Care | Payer: BLUE CROSS/BLUE SHIELD | Attending: Physician Assistant | Admitting: Physician Assistant

## 2017-03-28 DIAGNOSIS — G4489 Other headache syndrome: Secondary | ICD-10-CM | POA: Diagnosis not present

## 2017-03-28 HISTORY — DX: Headache, unspecified: R51.9

## 2017-03-28 HISTORY — DX: Headache: R51

## 2017-03-28 MED ORDER — BUTALBITAL-APAP-CAFFEINE 50-325-40 MG PO TABS: 1.0000 | ORAL_TABLET | Freq: Four times a day (QID) | ORAL | 0 refills | Status: DC | PRN

## 2017-03-28 MED ORDER — DOXYCYCLINE HYCLATE 100 MG PO CAPS: 100.0000 mg | ORAL_CAPSULE | Freq: Two times a day (BID) | ORAL | 0 refills | Status: AC

## 2017-03-28 NOTE — ED Triage Notes (Signed)
Was seen in ED for HA 10/12; has hx HAs.  Was given Fioricet Rx - had to take complete course of med.  Started with severe HA again last night.  C/O nausea, loss of appetite, muscle aches.

## 2017-03-28 NOTE — ED Provider Notes (Addendum)
03/28/2017 1:33 PM   DOB: 1970-06-05 / MRN: [id number]  SUBJECTIVE:  Benjamin [name] is a 46 y.o. male presenting for hat band like HA and tells me he has a chronic history of this.  This HA started last night.  Associates some mild fever and actually had to pull over the care as he has a history of labyrinthitis. Has a plan to see neurology very soon. Has tried Ibuprofen without relief. Has not tried tylenol yet. Associates teeth pain.  He is allergic to compazine [prochlorperazine edisylate]; penicillins; and pepto-bismol [bismuth subsalicylate].   He  has a past medical history of Adenomatous colon polyp (10/04/15); ALLERGIC RHINITIS (07/11/2008); Allergy; Angiolipoma (2007); Anxiety; BACK PAIN, CHRONIC, INTERMITTENT (10/17/2008); Barrett's esophagus determined by biopsy (04/12/14); BENIGN PROSTATIC HYPERTROPHY, WITH URINARY OBSTRUCTION (07/21/2007); BURSITIS, HIP (09/30/2009); CAP (community acquired pneumonia) (06/2014); Cataracts, bilateral (2016); Chronic headaches; Chronic renal insufficiency, stage II (mild); Diverticulitis (2016); Frequent headaches; Gastric polyp (2006); GERD without esophagitis; Herpes labialis; Hiatal hernia; Hyperlipidemia; HYPERTENSION (01/05/2007); and PSORIASIS (01/05/2007).    He  reports that he has never smoked. He has never used smokeless tobacco. He reports that he drinks about 1.2 oz of alcohol per week . He reports that he does not use drugs. He  has no sexual activity history on file. The patient  has a past surgical history that includes Lipoma excision; Esophagogastroduodenoscopy (04/12/14); and Colonoscopy w/ polypectomy (10/04/15).  His family history includes CAD in his father and paternal grandmother; Cancer in his maternal grandfather; Colon cancer in his unknown relative; Diabetes in his paternal grandmother; Heart disease in his father; Hyperlipidemia in his brother and father; Hypertension in his brother, father, and mother; Lung cancer in his maternal grandmother;  Prostate cancer in his unknown relative.  Review of Systems  Constitutional: Negative for chills, diaphoresis and fever.  HENT: Positive for sinus pain.   Eyes: Negative for blurred vision and double vision.  Respiratory: Negative for cough.   Cardiovascular: Negative for chest pain.  Gastrointestinal: Negative for nausea.  Skin: Negative for rash.  Neurological: Positive for headaches. Negative for dizziness, tingling, tremors, sensory change, speech change, focal weakness, seizures and loss of consciousness.    OBJECTIVE:  BP 118/87   Pulse 83   Temp 98.3 F (36.8 C) (Oral)   Resp 16   SpO2 100%   Physical Exam  Constitutional: He is oriented to person, place, and time. He appears well-developed. He is active and cooperative.  Non-toxic appearance.  HENT:  Head:    Mouth/Throat: No oropharyngeal exudate.  Eyes: Pupils are equal, round, and reactive to light. EOM are normal.  Cardiovascular: Normal rate, regular rhythm, S1 normal, S2 normal, normal heart sounds, intact distal pulses and normal pulses.  Exam reveals no gallop and no friction rub.   No murmur heard. Pulmonary/Chest: Effort normal. No stridor. No tachypnea. No respiratory distress. He has no wheezes. He has no rales.  Abdominal: He exhibits no distension.  Musculoskeletal: He exhibits no edema.  Neurological: He is alert and oriented to person, place, and time. He has normal strength and normal reflexes. He is not disoriented. He displays no atrophy, no tremor and normal reflexes. No cranial nerve deficit or sensory deficit. He exhibits normal muscle tone. He displays no seizure activity. Coordination and gait normal. GCS eye subscore is 4. GCS verbal subscore is 5. GCS motor subscore is 6.  Skin: Skin is warm and dry. He is not diaphoretic. No pallor.  Psychiatric: His behavior is normal.  Vitals reviewed.  No results found for this or any previous visit (from the past 72 hour(s)).  No results  found.  ASSESSMENT AND PLAN:  The encounter diagnosis was Other headache syndrome. Neuro reassuring.  Will refill what has been working for him.  Advised that he slow down on the ibuprofen. Doxy given teeth pain.     The patient is advised to call or return to clinic if he does not see an improvement in symptoms, or to seek the care of the closest emergency department if he worsens with the above plan.   Philis Fendt, MHS, PA-C 03/28/2017 1:33 PM    [name], PA-C 03/28/17 1335    [name], PA-C 03/28/17 1336

## 2017-03-29 ENCOUNTER — Telehealth: Payer: Self-pay | Admitting: Family Medicine

## 2017-03-29 ENCOUNTER — Ambulatory Visit (INDEPENDENT_AMBULATORY_CARE_PROVIDER_SITE_OTHER): Payer: BLUE CROSS/BLUE SHIELD | Admitting: Family Medicine

## 2017-03-29 ENCOUNTER — Encounter: Payer: Self-pay | Admitting: Family Medicine

## 2017-03-29 VITALS — BP 123/85 | HR 107 | Temp 99.4°F | Resp 20 | Ht 69.0 in | Wt 193.0 lb

## 2017-03-29 DIAGNOSIS — J320 Chronic maxillary sinusitis: Secondary | ICD-10-CM | POA: Insufficient documentation

## 2017-03-29 DIAGNOSIS — G4452 New daily persistent headache (NDPH): Secondary | ICD-10-CM | POA: Diagnosis not present

## 2017-03-29 MED ORDER — ONDANSETRON HCL 4 MG PO TABS: 4.0000 mg | ORAL_TABLET | Freq: Three times a day (TID) | ORAL | 0 refills | Status: DC | PRN

## 2017-03-29 MED ORDER — PREDNISONE 20 MG PO TABS: ORAL_TABLET | ORAL | 0 refills | Status: DC

## 2017-03-29 NOTE — Telephone Encounter (Signed)
Patient Name: Benjamin [name] DOB: 1970-07-24 Initial Comment Caller states has temp 101, still having migraines headaches; at [facility] UC yesterday for same; gave meds, not better; Nurse Assessment Nurse: Sherrell Puller, RN, Amy Date/Time Eilene Ghazi Time): 03/29/2017 1:31:27 PM Confirm and document reason for call. If symptomatic, describe symptoms. ---Caller states he has a fever of 101 currently, the fever comes and goes, has been going on for 3 days. Chronic headaches for 20 years but this headache feels different. He's been having nausea and light sensitivity for 2 weeks with the headache. Symptoms come and go. Was given a refill on his HA medication (Butalbital-Acetaminophen-Caffeine) and Doxycycline at the UC yesterday but he says the medication is not helping. Does the patient have any new or worsening symptoms? ---Yes Will a triage be completed? ---Yes Related visit to physician within the last 2 weeks? ---Yes Does the PT have any chronic conditions? (i.e. diabetes, asthma, etc.) ---Yes List chronic conditions. ---Headaches Is this a behavioral health or substance abuse call? ---No Guidelines Guideline Title Affirmed Question Affirmed Notes Headache [1] SEVERE headache AND [2] suddenonset (i.e., reaching maximum intensity within seconds) Final Disposition User Go to ED Now (or PCP triage) Sherrell Puller, RN, Amy Comments Appt scheduled with Dr. Raoul Pitch at [place] PCP OFFICE Caller Disagree/Comply Comply Caller Understands Yes PreDisposition Did not know what to do

## 2017-03-29 NOTE — Progress Notes (Signed)
Benjamin [name] , Oct 21, 1970, 46 y.o., male MRN: [id number] Patient Care Team    Relationship Specialty Notifications Start End  McGowen, Adrian Blackwater, MD PCP - General Family Medicine  03/06/14    Comment: Dr. Arnoldo Morale transfer  Darleen Crocker, MD Consulting Physician Ophthalmology  08/25/14   Ladene Artist, MD Consulting Physician Gastroenterology  12/18/14     Chief Complaint  Patient presents with  . Headache     Subjective: Pt presents for an OV with complaints of Persistent headache since a few days prior to his initial visit October 11. Patient has been seen in the emergency room twice since October 11, and once with his PCP for headaches. He endorses headaches are a chronic issue for him, however they have become substantially worse since the beginning of this month. He was seen in the urgent care yesterday and provided with doxycycline to start for the facial pressure pain. Patient reports new onset of fever up to MAXIMUM TEMPERATURE of 101 Fahrenheit today. He reports he took 2 at the a few hours ago to help with the discomfort and the fever. In review of history over the last few weeks, patient does endorse being seen at his dentist a few days prior to October 11 for what he presumed was dental pain. His dentist did not feel his pain was coming from his teeth. He endorses left maxillary sinus is the location of his discomfort. He endorses nausea, dizziness, wax/waning temperature last 48 hours, decreased appetite over the last few weeks. He endorses originally symptoms were more consistent with his headaches and light sensitivity. He reports being tried on tramadol, which was not helpful at all for his headaches. He did get some mild relief initially with anti-inflammatory injection in the emergency room. He admits he is very concerned over potential meningioma since to his relatives had one. Prior office notes he has declined start of medication to help control headaches. His PCP has  placed a referral to neurology with initial appointment scheduled for January 2019.   Depression screen PHQ 2/9 10/23/2016  Decreased Interest 1  Down, Depressed, Hopeless 0  PHQ - 2 Score 1  Altered sleeping 3  Tired, decreased energy 2  Change in appetite 1  Feeling bad or failure about yourself  0  Trouble concentrating 2  Moving slowly or fidgety/restless 0  Suicidal thoughts 0  PHQ-9 Score 9    Allergies  Allergen Reactions  . Compazine [Prochlorperazine Edisylate] Other (See Comments)    Cervical dystonia and panic attack  . Penicillins Other (See Comments)    Childhood allergy Has patient had a PCN reaction causing immediate rash, facial/tongue/throat swelling, SOB or lightheadedness with hypotension: unknown Has patient had a PCN reaction causing severe rash involving mucus membranes or skin necrosis: Unknown Has patient had a PCN reaction that required hospitalization: Unknown Has patient had a PCN reaction occurring within the last 10 years: Unknown If all of the above answers are "NO", then may proceed with Cephalosporin use.    . Pepto-Bismol [Bismuth Subsalicylate] Swelling   Social History  Substance Use Topics  . Smoking status: Never Smoker  . Smokeless tobacco: Never Used  . Alcohol use 1.2 oz/week    2 Cans of beer per week   Past Medical History:  Diagnosis Date  . Adenomatous colon polyp 10/04/15  . ALLERGIC RHINITIS 07/11/2008  . Allergy    SEASONAL  . 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 Anxiety   .  BACK PAIN, CHRONIC, INTERMITTENT 10/17/2008  . Barrett's esophagus determined by biopsy 04/12/14  . BENIGN PROSTATIC HYPERTROPHY, WITH URINARY OBSTRUCTION 07/21/2007  . BURSITIS, HIP 09/30/2009  . CAP (community acquired pneumonia) 06/2014  . Cataracts, bilateral 2016   Not visually significant  . Chronic headaches    tramadol  . Chronic renal insufficiency, stage II (mild)    GFR 60s-70s  . Diverticulitis 2016   CT 01/2015  . Frequent headaches    . Gastric polyp 2006  . GERD without esophagitis   . Herpes labialis   . Hiatal hernia   . Hyperlipidemia   . HYPERTENSION 01/05/2007  . PSORIASIS 01/05/2007   Past Surgical History:  Procedure Laterality Date  . COLONOSCOPY W/ POLYPECTOMY  10/04/15   45m tubular adenoma + diverticulosis: recall 5 yrs (Dr. SFuller Plan  . ESOPHAGOGASTRODUODENOSCOPY  04/12/14   Barrett's esophagus  . LIPOMA EXCISION     several, arm   Family History  Problem Relation Age of Onset  . CAD Father        around age 47 . Heart disease Father   . Hyperlipidemia Father   . Hypertension Father   . CAD Paternal Grandmother   . Diabetes Paternal Grandmother   . Hypertension Mother   . Hypertension Brother   . Hyperlipidemia Brother   . Lung cancer Maternal Grandmother   . Cancer Maternal Grandfather        type unknown  . Colon cancer Unknown        neg hx  . Prostate cancer Unknown        neg hx   Allergies as of 03/29/2017      Reactions   Compazine [prochlorperazine Edisylate] Other (See Comments)   Cervical dystonia and panic attack   Penicillins Other (See Comments)   Childhood allergy Has patient had a PCN reaction causing immediate rash, facial/tongue/throat swelling, SOB or lightheadedness with hypotension: unknown Has patient had a PCN reaction causing severe rash involving mucus membranes or skin necrosis: Unknown Has patient had a PCN reaction that required hospitalization: Unknown Has patient had a PCN reaction occurring within the last 10 years: Unknown If all of the above answers are "NO", then may proceed with Cephalosporin use.   Pepto-bismol [bismuth Subsalicylate] Swelling      Medication List       Accurate as of 03/29/17  3:03 PM. Always use your most recent med list.          bifidobacterium infantis capsule Take 1 capsule by mouth daily.   bisoprolol-hydrochlorothiazide 5-6.25 MG tablet Commonly known as:  ZIAC TAKE 1 TABLET BY MOUTH DAILY.   buPROPion 300 MG 24 hr  tablet Commonly known as:  WELLBUTRIN XL Take 1 tablet (300 mg total) by mouth daily.   butalbital-acetaminophen-caffeine 50-325-40 MG tablet Commonly known as:  FIORICET, ESGIC Take 1-2 tablets by mouth every 6 (six) hours as needed for headache.   doxycycline 100 MG capsule Commonly known as:  VIBRAMYCIN Take 1 capsule (100 mg total) by mouth 2 (two) times daily.   fluticasone 50 MCG/ACT nasal spray Commonly known as:  FLONASE Place 2 sprays into both nostrils daily.   hyoscyamine 0.125 MG SL tablet Commonly known as:  LEVSIN SL Place 1 tablet (0.125 mg total) under the tongue every 4 (four) hours as needed for cramping.   RABEprazole 20 MG tablet Commonly known as:  ACIPHEX Take 1 tablet (20 mg total) by mouth daily.   rosuvastatin 40 MG tablet Commonly known as:  CRESTOR TAKE 1 TABLET BY MOUTH ONCE A WEEK.   tamsulosin 0.4 MG Caps capsule Commonly known as:  FLOMAX TAKE 1 CAPSULE BY MOUTH EVERY DAY   traMADol 50 MG tablet Commonly known as:  ULTRAM Take 1 tablet (50 mg total) by mouth every 6 (six) hours as needed for moderate pain.   valACYclovir 500 MG tablet Commonly known as:  VALTREX Take 1 tablet (500 mg total) by mouth daily.       All past medical history, surgical history, allergies, family history, immunizations andmedications were updated in the EMR today and reviewed under the history and medication portions of their EMR.     ROS: Negative, with the exception of above mentioned in HPI   Objective:  BP 123/85 (BP Location: Right Arm, Patient Position: Sitting, Cuff Size: Large)   Pulse (!) 107   Temp 99.4 F (37.4 C)   Resp 20   Ht _0  (1.753 m)   Wt 193 lb (87.5 kg)   SpO2 97%   BMI 28.50 kg/m  Body mass index is 28.5 kg/m. Gen: Afebrile. No acute distress. Nontoxic in appearance, well developed, well nourished.  HENT: AT. [place]. Bilateral TM visualized without erythema or bulging. Tacky mucous membranes, no oral lesions. Bilateral nares  with erythema, drainage present, no swelling. Throat without erythema or exudates. No cough. No hoarseness. Moderate TTP left max sinusitis.  Eyes:Pupils Equal Round Reactive to light, Extraocular movements intact,  Conjunctiva without redness, discharge or icterus. Neck/lymp/endocrine: Supple,no lymphadenopathy, Neg kernig's.  CV: RRR  Chest: CTAB, no wheeze or crackles. Good air movement, normal resp effort.  Skin: no rashes, purpura or petechiae.  Neuro: Normal gait. PERLA. EOMi. Alert. Oriented x3  Psych: Anxious. Otherwise, Normal affect, dress and demeanor. Normal speech. Normal thought content and judgment.  No exam data present No results found. No results found for this or any previous visit (from the past 24 hour(s)).  Assessment/Plan: AKI ABALOS is a 46 y.o. male present for OV for  New daily persistent headache Maxillary sinusitis, unspecified chronicity - This seems to be a chronic issue that has worsened to daily persistent headache since ~Oct.11. He is anxious and tachycardic today. He is concerned over meningioma since 2 family members have been diagnosed. I agree he should have neurology evaluation and potential image, which he now has an appt 11/[facility] at [facility] (moved up today). Will defer imaging to neurology eval and PCP follow-up. Acutely his additional symptoms are concentrated over left maxillary region and are consistent with sinusitis. Pt reports a fever that has wax/waned over last 48 hours. He is not eating or drinking well, and is mildly dehydrated on exam.  - CBC on 10/11 with normal WBC. Will recheck today along with esr.  - continue doxy BID for 10 days (given by ED). Start prednisone burst. Zofran for nausea and allow pt to hydrate. Continue Flonase.  - For pain, patient continue over-the-counter regimen or prior tramadol prescribed by PCP. Roughly with the addition of prednisone burst, this relieves some of his discomfort, given the most relief he had was on an  anti-inflammatory. - predniSONE (DELTASONE) 20 MG tablet; 60 mg x3d, 40 mg x3d, 20 mg x2d, 10 mg x2d  Dispense: 18 tablet; Refill: 0 - Sedimentation rate - CBC w/Diff - F/U 1 week with PCP or sooner if worsening. Neuro schedule for 2 weeks.    Reviewed expectations re: course of current medical issues.  Discussed self-management of symptoms.  Outlined signs and symptoms  indicating need for more acute intervention.  Patient verbalized understanding and all questions were answered.  Patient received an After-Visit Summary.    No orders of the defined types were placed in this encounter.    Note is dictated utilizing voice recognition software. Although note has been proof read prior to signing, occasional typographical errors still can be missed. If any questions arise, please do not hesitate to call for verification.   electronically signed by:  Howard Pouch, DO  [place]

## 2017-03-29 NOTE — Telephone Encounter (Signed)
Patient has scheduled appt for today.

## 2017-03-29 NOTE — Patient Instructions (Signed)
Start prednisone today and continue as prescribed.  Continue doxycyline every 12 hours for 10 days.  Use zofran every 8 hours for next 2-3 days to allow you to drink plenty of fluids. You appear mildly dehydrated today.   I will call you  With lab results once available.    If worsening then go to ED. I believe the facial pain is from potential sinus infection given your symptoms but certainly follow up with neuro for the persistent everyday headache.   Follow up with PCP in 1 week, sooner if worsening.

## 2017-03-29 NOTE — Telephone Encounter (Signed)
Patient called stating that he was feeling worse than when he was seen by Dr Anitra Lauth.  Patient notified Dr Anitra Lauth out of office today, transferred to [facility] for assistance.

## 2017-03-30 LAB — CBC WITH DIFFERENTIAL/PLATELET
Basophils Absolute: 90 cells/uL (ref 0–200)
Basophils Relative: 1.6 %
Eosinophils Absolute: 78 cells/uL (ref 15–500)
Eosinophils Relative: 1.4 %
HCT: 41.1 % (ref 38.5–50.0)
Hemoglobin: 14.2 g/dL (ref 13.2–17.1)
Lymphs Abs: 2038 cells/uL (ref 850–3900)
MCH: 29.8 pg (ref 27.0–33.0)
MCHC: 34.5 g/dL (ref 32.0–36.0)
MCV: 86.2 fL (ref 80.0–100.0)
MPV: 10.2 fL (ref 7.5–12.5)
Monocytes Relative: 8.4 %
Neutro Abs: 2923 cells/uL (ref 1500–7800)
Neutrophils Relative %: 52.2 %
Platelets: 271 10*3/uL (ref 140–400)
RBC: 4.77 10*6/uL (ref 4.20–5.80)
RDW: 12.7 % (ref 11.0–15.0)
Total Lymphocyte: 36.4 %
WBC mixed population: 470 cells/uL (ref 200–950)
WBC: 5.6 10*3/uL (ref 3.8–10.8)

## 2017-03-30 LAB — SEDIMENTATION RATE: Sed Rate: 19 mm/h — ABNORMAL HIGH (ref 0–15)

## 2017-04-01 DIAGNOSIS — G4733 Obstructive sleep apnea (adult) (pediatric): Secondary | ICD-10-CM

## 2017-04-01 HISTORY — DX: Obstructive sleep apnea (adult) (pediatric): G47.33

## 2017-04-06 ENCOUNTER — Ambulatory Visit (INDEPENDENT_AMBULATORY_CARE_PROVIDER_SITE_OTHER): Payer: BLUE CROSS/BLUE SHIELD | Admitting: Family Medicine

## 2017-04-06 ENCOUNTER — Encounter: Payer: Self-pay | Admitting: Family Medicine

## 2017-04-06 VITALS — BP 130/72 | HR 89 | Temp 98.8°F | Resp 16 | Ht 69.0 in | Wt 191.2 lb

## 2017-04-06 DIAGNOSIS — R5081 Fever presenting with conditions classified elsewhere: Secondary | ICD-10-CM

## 2017-04-06 DIAGNOSIS — J3489 Other specified disorders of nose and nasal sinuses: Secondary | ICD-10-CM | POA: Diagnosis not present

## 2017-04-06 DIAGNOSIS — R5383 Other fatigue: Secondary | ICD-10-CM

## 2017-04-06 DIAGNOSIS — R110 Nausea: Secondary | ICD-10-CM | POA: Diagnosis not present

## 2017-04-06 DIAGNOSIS — R1030 Lower abdominal pain, unspecified: Secondary | ICD-10-CM

## 2017-04-06 DIAGNOSIS — J322 Chronic ethmoidal sinusitis: Secondary | ICD-10-CM

## 2017-04-06 DIAGNOSIS — R5381 Other malaise: Secondary | ICD-10-CM

## 2017-04-06 LAB — CBC WITH DIFFERENTIAL/PLATELET
Basophils Absolute: 0.1 10*3/uL (ref 0.0–0.1)
Basophils Relative: 1.1 % (ref 0.0–3.0)
Eosinophils Absolute: 0.1 10*3/uL (ref 0.0–0.7)
Eosinophils Relative: 1.1 % (ref 0.0–5.0)
HCT: 42.5 % (ref 39.0–52.0)
Hemoglobin: 14.3 g/dL (ref 13.0–17.0)
Lymphocytes Relative: 48.9 % — ABNORMAL HIGH (ref 12.0–46.0)
Lymphs Abs: 4 10*3/uL (ref 0.7–4.0)
MCHC: 33.7 g/dL (ref 30.0–36.0)
MCV: 89.5 fl (ref 78.0–100.0)
Monocytes Absolute: 0.9 10*3/uL (ref 0.1–1.0)
Monocytes Relative: 11.2 % (ref 3.0–12.0)
Neutro Abs: 3.1 10*3/uL (ref 1.4–7.7)
Neutrophils Relative %: 37.7 % — ABNORMAL LOW (ref 43.0–77.0)
Platelets: 293 10*3/uL (ref 150.0–400.0)
RBC: 4.74 Mil/uL (ref 4.22–5.81)
RDW: 13.2 % (ref 11.5–15.5)
WBC: 8.2 10*3/uL (ref 4.0–10.5)

## 2017-04-06 LAB — COMPREHENSIVE METABOLIC PANEL
ALT: 40 U/L (ref 0–53)
AST: 26 U/L (ref 0–37)
Albumin: 3.8 g/dL (ref 3.5–5.2)
Alkaline Phosphatase: 72 U/L (ref 39–117)
BUN: 18 mg/dL (ref 6–23)
CO2: 30 mEq/L (ref 19–32)
Calcium: 9.3 mg/dL (ref 8.4–10.5)
Chloride: 101 mEq/L (ref 96–112)
Creatinine, Ser: 1.29 mg/dL (ref 0.40–1.50)
GFR: 63.29 mL/min (ref >60.00)
Glucose, Bld: 90 mg/dL (ref 70–99)
Potassium: 3.4 mEq/L — ABNORMAL LOW (ref 3.5–5.1)
Sodium: 138 mEq/L (ref 135–145)
Total Bilirubin: 0.6 mg/dL (ref 0.2–1.2)
Total Protein: 7 g/dL (ref 6.0–8.3)

## 2017-04-06 LAB — C-REACTIVE PROTEIN: CRP: 2 mg/dL (ref 0.5–20.0)

## 2017-04-06 LAB — SEDIMENTATION RATE: Sed Rate: 8 mm/hr (ref 0–15)

## 2017-04-06 MED ORDER — NYSTATIN 100000 UNIT/ML MT SUSP: OROMUCOSAL | 0 refills | Status: DC

## 2017-04-06 NOTE — Progress Notes (Signed)
OFFICE VISIT  04/06/2017   CC:  Chief Complaint  Patient presents with  . low grade fever   HPI:    Patient is a 46 y.o. Caucasian male who presents for "low grade fever". Tm 99.5 to 100.1--says it has been hovering in that range for last 10 days or so.  Took advil this morning. Mild nausea on/off but no vomiting. Some very mild diffuse lower abd pains.  His HA's have still been occurring evening, feeling tired. Some mild body aches, low back/back of legs.  No rash.  No cough or ST. Denies dysuria, urgency, or frequency.  No constipation or diarrhea.  No melena or hematochezia.  Of note, pt has chronic daily HA syndrome w/ ? Recurrent sinusitis and we've been treating him for this and also have arranged for neurology consult--has this pretty soon. Prednisone taper started 03/29/17 here.  Tramadol for abortive use.  Has had a couple of ED visits lately for HA/sinus pain and ? Fever. Doxy most recent antibiotic, finishes this tomorrow.  He is very worried about HA's being from meningioma b/c of family hx of this.  ROS: no focal weakness, no dizziness, no vertigo, no vision complaints, no paresthesias.   Past Medical History:  Diagnosis Date  . Adenomatous colon polyp 10/04/15  . ALLERGIC RHINITIS 07/11/2008  . Allergy    SEASONAL  . 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 Anxiety   . BACK PAIN, CHRONIC, INTERMITTENT 10/17/2008  . Barrett's esophagus determined by biopsy 04/12/14  . BENIGN PROSTATIC HYPERTROPHY, WITH URINARY OBSTRUCTION 07/21/2007  . BURSITIS, HIP 09/30/2009  . CAP (community acquired pneumonia) 06/2014  . Cataracts, bilateral 2016   Not visually significant  . Chronic headaches    tramadol  . Chronic renal insufficiency, stage II (mild)    GFR 60s-70s  . Diverticulitis 2016   CT 01/2015  . Frequent headaches   . Gastric polyp 2006  . GERD without esophagitis   . Herpes labialis   . Hiatal hernia   . Hyperlipidemia   . HYPERTENSION 01/05/2007  . PSORIASIS  01/05/2007    Past Surgical History:  Procedure Laterality Date  . COLONOSCOPY W/ POLYPECTOMY  10/04/15   71m tubular adenoma + diverticulosis: recall 5 yrs (Dr. SFuller Plan  . ESOPHAGOGASTRODUODENOSCOPY  04/12/14   Barrett's esophagus  . LIPOMA EXCISION     several, arm    Outpatient Medications Prior to Visit  Medication Sig Dispense Refill  . bisoprolol-hydrochlorothiazide (ZIAC) 5-6.25 MG tablet TAKE 1 TABLET BY MOUTH DAILY. 90 tablet 1  . buPROPion (WELLBUTRIN XL) 300 MG 24 hr tablet Take 1 tablet (300 mg total) by mouth daily. 90 tablet 1  . butalbital-acetaminophen-caffeine (FIORICET, ESGIC) 50-325-40 MG tablet Take 1-2 tablets by mouth every 6 (six) hours as needed for headache. 16 tablet 0  . doxycycline (VIBRAMYCIN) 100 MG capsule Take 1 capsule (100 mg total) by mouth 2 (two) times daily. 20 capsule 0  . fluticasone (FLONASE) 50 MCG/ACT nasal spray Place 2 sprays into both nostrils daily. 16 g 6  . hyoscyamine (LEVSIN SL) 0.125 MG SL tablet Place 1 tablet (0.125 mg total) under the tongue every 4 (four) hours as needed for cramping. 120 tablet 11  . ondansetron (ZOFRAN) 4 MG tablet Take 1 tablet (4 mg total) by mouth every 8 (eight) hours as needed for nausea or vomiting. 20 tablet 0  . predniSONE (DELTASONE) 20 MG tablet 60 mg x3d, 40 mg x3d, 20 mg x2d, 10 mg x2d 18 tablet  0  . RABEprazole (ACIPHEX) 20 MG tablet Take 1 tablet (20 mg total) by mouth daily. 90 tablet 3  . rosuvastatin (CRESTOR) 40 MG tablet TAKE 1 TABLET BY MOUTH ONCE A WEEK. 4 tablet 12  . tamsulosin (FLOMAX) 0.4 MG CAPS capsule TAKE 1 CAPSULE BY MOUTH EVERY DAY 90 capsule 1  . traMADol (ULTRAM) 50 MG tablet Take 1 tablet (50 mg total) by mouth every 6 (six) hours as needed for moderate pain. 90 tablet 1  . valACYclovir (VALTREX) 500 MG tablet Take 1 tablet (500 mg total) by mouth daily. 30 tablet 12  . bifidobacterium infantis (ALIGN) capsule Take 1 capsule by mouth daily.     No facility-administered medications  prior to visit.     Allergies  Allergen Reactions  . Compazine [Prochlorperazine Edisylate] Other (See Comments)    Cervical dystonia and panic attack  . Penicillins Other (See Comments)    Childhood allergy Has patient had a PCN reaction causing immediate rash, facial/tongue/throat swelling, SOB or lightheadedness with hypotension: unknown Has patient had a PCN reaction causing severe rash involving mucus membranes or skin necrosis: Unknown Has patient had a PCN reaction that required hospitalization: Unknown Has patient had a PCN reaction occurring within the last 10 years: Unknown If all of the above answers are "NO", then may proceed with Cephalosporin use.    . Pepto-Bismol [Bismuth Subsalicylate] Swelling    ROS As per HPI  PE: Blood pressure 130/72, pulse 89, temperature 98.8 F (37.1 C), temperature source Oral, resp. rate 16, height 5' 9"  (1.753 m), weight 191 lb 4 oz (86.8 kg), SpO2 99 %. VS: noted--normal. Gen: alert, NAD, well APPEARING. HEENT: eyes without injection, drainage, or swelling.  Ears: EACs clear, TMs with normal light reflex and landmarks.  Nose: Clear rhinorrhea, with some dried, crusty exudate adherent to mildly injected mucosa.  No purulent d/c.  Mild frontal and L ethmoid region focal TTP.  No other paranasal sinus TTP.  No facial swelling.  Throat and mouth without focal lesion.  No pharyngial swelling, erythema, or exudate.  Tongue with diffuse, thick and white exudate adherent to it.   Neck: supple, no LAD.   LUNGS: CTA bilat, nonlabored resps.   CV: RRR, no m/r/g. ABD: soft, non-distended.  BS normal.  No HSM or mass or bruit.  He has mild bilat lower abd TTP w/out guarding or rebound. EXT: no c/c/e SKIN: no rash Neuro: CN 2-12 intact bilaterally, strength 5/5 in proximal and distal upper extremities and lower extremities bilaterally.   No tremor. No ataxia.     LABS:    Chemistry      Component Value Date/Time   NA 138 04/06/2017 1037   K  3.4 (L) 04/06/2017 1037   CL 101 04/06/2017 1037   CO2 30 04/06/2017 1037   BUN 18 04/06/2017 1037   CREATININE 1.29 04/06/2017 1037   CREATININE 1.31 01/31/2015 1649      Component Value Date/Time   CALCIUM 9.3 04/06/2017 1037   ALKPHOS 72 04/06/2017 1037   AST 26 04/06/2017 1037   ALT 40 04/06/2017 1037   BILITOT 0.6 04/06/2017 1037     Lab Results  Component Value Date   WBC 8.2 04/06/2017   HGB 14.3 04/06/2017   HCT 42.5 04/06/2017   MCV 89.5 04/06/2017   PLT 293.0 04/06/2017   Lab Results  Component Value Date   ESRSEDRATE 8 04/06/2017    IMPRESSION AND PLAN:  1) Persistent malaise, HA's, mild sinusitis sx's, with  mildly elevated temp's recently but no true fevers. He is finishing a course of doxy tomorrow.  Reassured pt no red flags noted for any significant acute infectious process.  No new abx today. Check sinus plain films today.  Recheck CBC, CMET, ESR, CRP.  2) Thrush: from recent steroids and abx: nystatin susp rx'd today.  3) chronic daily HA's: these are improving some lately. He is definitely stressing about his worry about meningioma, but I see no acute neurologic sign to indicate any imaging today.  He has neuro consult coming up very soon.  Reassured pt today.  An After Visit Summary was printed and given to the patient.  FOLLOW UP: Return if symptoms worsen or fail to improve.  Signed:  Crissie Sickles, MD           04/06/2017

## 2017-04-09 ENCOUNTER — Encounter: Payer: Self-pay | Admitting: Neurology

## 2017-04-09 ENCOUNTER — Ambulatory Visit (INDEPENDENT_AMBULATORY_CARE_PROVIDER_SITE_OTHER): Payer: BLUE CROSS/BLUE SHIELD | Admitting: Neurology

## 2017-04-09 ENCOUNTER — Telehealth: Payer: Self-pay | Admitting: Neurology

## 2017-04-09 VITALS — BP 115/70 | HR 96 | Ht 69.0 in | Wt 190.0 lb

## 2017-04-09 DIAGNOSIS — G43711 Chronic migraine without aura, intractable, with status migrainosus: Secondary | ICD-10-CM | POA: Diagnosis not present

## 2017-04-09 DIAGNOSIS — R509 Fever, unspecified: Secondary | ICD-10-CM | POA: Diagnosis not present

## 2017-04-09 DIAGNOSIS — G4719 Other hypersomnia: Secondary | ICD-10-CM

## 2017-04-09 DIAGNOSIS — H539 Unspecified visual disturbance: Secondary | ICD-10-CM | POA: Diagnosis not present

## 2017-04-09 DIAGNOSIS — R51 Headache with orthostatic component, not elsewhere classified: Secondary | ICD-10-CM

## 2017-04-09 DIAGNOSIS — R42 Dizziness and giddiness: Secondary | ICD-10-CM

## 2017-04-09 DIAGNOSIS — R519 Headache, unspecified: Secondary | ICD-10-CM

## 2017-04-09 DIAGNOSIS — R0683 Snoring: Secondary | ICD-10-CM

## 2017-04-09 MED ORDER — TOPIRAMATE ER 100 MG PO SPRINKLE CAP24: 100.0000 mg | EXTENDED_RELEASE_CAPSULE | Freq: Every evening | ORAL | 11 refills | Status: DC | PRN

## 2017-04-09 MED ORDER — ONDANSETRON 4 MG PO TBDP: 4.0000 mg | ORAL_TABLET | Freq: Three times a day (TID) | ORAL | 3 refills | Status: DC | PRN

## 2017-04-09 MED ORDER — TIZANIDINE HCL 4 MG PO TABS: 4.0000 mg | ORAL_TABLET | Freq: Four times a day (QID) | ORAL | 6 refills | Status: DC | PRN

## 2017-04-09 NOTE — Telephone Encounter (Signed)
Patient is schedule to have his MRI done on 04/14/17 at the [facility] mobile unit. He did inform me that he is claustrophobic and would like something to calm his nerves.

## 2017-04-09 NOTE — Progress Notes (Signed)
[facility]    Provider:  Dr Jaynee Eagles Referring Provider: Tammi Sou, MD Primary Care Physician:  Tammi Sou, MD  CC:  Chronic daily headache  HPI:  Benjamin [name] is a 46 y.o. male here as a referral from Dr. Anitra Lauth for headaches. His mother and grandmother with migraines. Started in his 25s.  In the past he was told they were stress induced and also shift changes affected him. He has been on Ultram for years and takes them a few times a week. Recently the headaches are worse. He starts getting nauseas, he can feel his head getting hot, pulsating in the temples bilaterally with light sensitivity, nausea, no vomiting, he doesn't like noises it makes him worse, worse with movement, he feels disoriented, head rush, associated dizziness as a lot of vertigo. Started worsening last month, no inciting event maybe because he has not been eating as well. He had a severe one he went ot the ED last month. Usually he has 25 headache days a month, only 5-6 headache free days. He takes ibuprofen almost daily multiple times, also with fevers. Vision changes, dizziness.   Meds used: Compazine (allergy), Tramadol (not helpful), Fioricet (helpful, overuse), tylenol and ibuprofen.   Reviewed notes, labs and imaging from outside physicians, which showed:   TSH, CBC, CMP unremarkable  Reviewed notes. Patient seen in the ED for chronic headaches. Hx of Labyrinthitis. Had a fever. Tried ibuprofen. He endorsed sinus pain. Had a fever for several days prior to visit with nausea and light sensitivity for 2 weeks with the headache, taking Fioricet. Also say his pcp for this. Headaches worsening since the beginning of this month. Started Doxy for possible infections. Saw a dentist recently as well. Also nausea, dizziness.    Review of Systems: Patient complains of symptoms per HPI as well as the following symptoms: headache, fever. Pertinent negatives and positives per HPI. All others  negative.   Social History   Socioeconomic History  . Marital status: Single    Spouse name: Not on file  . Number of children: 2  . Years of education: Not on file  . Highest education level: Not on file  Social Needs  . Financial resource strain: Not on file  . Food insecurity - worry: Not on file  . Food insecurity - inability: Not on file  . Transportation needs - medical: Not on file  . Transportation needs - non-medical: Not on file  Occupational History  . Occupation: regional Veterinary surgeon    Comment: Network engineer  Tobacco Use  . Smoking status: Never Smoker  . Smokeless tobacco: Never Used  Substance and Sexual Activity  . Alcohol use: Yes    Alcohol/week: 1.2 oz    Types: 2 Cans of beer per week    Comment: occasionally  . Drug use: No  . Sexual activity: Not on file  Other Topics Concern  . Not on file  Social History Narrative   Currently separated as of 01/2014, 2 children.  One younger brother.   Occupation: Press photographer for [employer]   No tobacco.   Occ alcohol (1-2 x/month).  No hx of alc/drug prob.   Exercise: runs about 2 times a week. Not as much lately due to medical state   Diet: regular american diet.   Right handed   1 cup coffee daily       Family History  Problem Relation Age of Onset  . CAD Father  around age 25  . Heart disease Father   . Hyperlipidemia Father   . Hypertension Father   . CAD Paternal Grandmother   . Diabetes Paternal Grandmother   . Hypertension Mother   . Hypertension Brother   . Hyperlipidemia Brother   . Lung cancer Maternal Grandmother   . Cancer Maternal Grandfather        type unknown  . Colon cancer Unknown        neg hx  . Prostate cancer Unknown        neg hx    Past Medical History:  Diagnosis Date  . Adenomatous colon polyp 10/04/15  . ALLERGIC RHINITIS 07/11/2008  . Allergy    SEASONAL  . 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 Anxiety   . BACK PAIN, CHRONIC, INTERMITTENT 10/17/2008   . Barrett's esophagus determined by biopsy 04/12/14  . BENIGN PROSTATIC HYPERTROPHY, WITH URINARY OBSTRUCTION 07/21/2007  . BURSITIS, HIP 09/30/2009  . CAP (community acquired pneumonia) 06/2014  . Cataracts, bilateral 2016   Not visually significant  . Chronic headaches    tramadol  . Chronic renal insufficiency, stage II (mild)    GFR 60s-70s  . Diverticulitis 2016   CT 01/2015  . Diverticulitis   . Frequent headaches   . Gastric polyp 2006  . GERD without esophagitis   . Herpes labialis   . Hiatal hernia   . Hyperlipidemia   . HYPERTENSION 01/05/2007  . PSORIASIS 01/05/2007    Past Surgical History:  Procedure Laterality Date  . COLONOSCOPY W/ POLYPECTOMY  10/04/15   35mm tubular adenoma + diverticulosis: recall 5 yrs (Dr. Fuller Plan)  . ESOPHAGOGASTRODUODENOSCOPY  04/12/14   Barrett's esophagus  . LIPOMA EXCISION     several, arm    Current Outpatient Medications  Medication Sig Dispense Refill  . bifidobacterium infantis (ALIGN) capsule Take 1 capsule by mouth daily.    . bisoprolol-hydrochlorothiazide (ZIAC) 5-6.25 MG tablet TAKE 1 TABLET BY MOUTH DAILY. 90 tablet 1  . buPROPion (WELLBUTRIN XL) 300 MG 24 hr tablet Take 1 tablet (300 mg total) by mouth daily. 90 tablet 1  . butalbital-acetaminophen-caffeine (FIORICET, ESGIC) 50-325-40 MG tablet Take 1-2 tablets by mouth every 6 (six) hours as needed for headache. 16 tablet 0  . fluticasone (FLONASE) 50 MCG/ACT nasal spray Place 2 sprays into both nostrils daily. 16 g 6  . hyoscyamine (LEVSIN SL) 0.125 MG SL tablet Place 1 tablet (0.125 mg total) under the tongue every 4 (four) hours as needed for cramping. 120 tablet 11  . nystatin (MYCOSTATIN) 100000 UNIT/ML suspension 1 tsp po swish and swallow qid x 14 days 280 mL 0  . ondansetron (ZOFRAN) 4 MG tablet Take 1 tablet (4 mg total) by mouth every 8 (eight) hours as needed for nausea or vomiting. 20 tablet 0  . RABEprazole (ACIPHEX) 20 MG tablet Take 1 tablet (20 mg total) by mouth  daily. 90 tablet 3  . rosuvastatin (CRESTOR) 40 MG tablet TAKE 1 TABLET BY MOUTH ONCE A WEEK. 4 tablet 12  . tamsulosin (FLOMAX) 0.4 MG CAPS capsule TAKE 1 CAPSULE BY MOUTH EVERY DAY 90 capsule 1  . traMADol (ULTRAM) 50 MG tablet Take 1 tablet (50 mg total) by mouth every 6 (six) hours as needed for moderate pain. 90 tablet 1  . valACYclovir (VALTREX) 500 MG tablet Take 1 tablet (500 mg total) by mouth daily. (Patient taking differently: Take 500 mg daily as needed by mouth. ) 30 tablet 12  . ondansetron (  ZOFRAN-ODT) 4 MG disintegrating tablet Take 1 tablet (4 mg total) every 8 (eight) hours as needed by mouth for nausea. Or for headache. 90 tablet 3  . tiZANidine (ZANAFLEX) 4 MG tablet Take 1 tablet (4 mg total) every 6 (six) hours as needed by mouth for muscle spasms. Or for headaches. 60 tablet 6  . Topiramate ER (QUDEXY XR) 100 MG CS24 sprinkle capsule Take 100 mg at bedtime as needed by mouth. 30 each 11   No current facility-administered medications for this visit.     Allergies as of 04/09/2017 - Review Complete 04/09/2017  Allergen Reaction Noted  . Compazine [prochlorperazine edisylate] Other (See Comments) 03/24/2017  . Penicillins Other (See Comments)   . Pepto-bismol [bismuth subsalicylate] Swelling 84/16/6063    Vitals: BP 115/70 (BP Location: Right Arm, Patient Position: Sitting)   Pulse 96   Ht 5\' 9"  (1.753 m)   Wt 190 lb (86.2 kg)   BMI 28.06 kg/m  Last Weight:  Wt Readings from Last 1 Encounters:  04/09/17 190 lb (86.2 kg)   Last Height:   Ht Readings from Last 1 Encounters:  04/09/17 5\' 9"  (1.753 m)    Physical exam: Exam: Gen: NAD, conversant, well nourised, well groomed                     CV: RRR, no MRG. No Carotid Bruits. No peripheral edema, warm, nontender Eyes: Conjunctivae clear without exudates or hemorrhage  Neuro: Detailed Neurologic Exam  Speech:    Speech is normal; fluent and spontaneous with normal comprehension.  Cognition:    The  patient is oriented to person, place, and time;     recent and remote memory intact;     language fluent;     normal attention, concentration,     fund of knowledge Cranial Nerves:    The pupils are equal, round, and reactive to light. The fundi are normal and spontaneous venous pulsations are present. Visual fields are full to finger confrontation. Extraocular movements are intact. Trigeminal sensation is intact and the muscles of mastication are normal. The face is symmetric. The palate elevates in the midline. Hearing intact. Voice is normal. Shoulder shrug is normal. The tongue has normal motion without fasciculations.   Coordination:    Normal finger to nose and heel to shin. Normal rapid alternating movements.   Gait:    Heel-toe and tandem gait are normal.   Motor Observation:    No asymmetry, no atrophy, and no involuntary movements noted. Tone:    Normal muscle tone.    Posture:    Posture is normal. normal erect    Strength:    Strength is V/V in the upper and lower limbs.      Sensation: intact to LT     Reflex Exam:  DTR's:    Deep tendon reflexes in the upper and lower extremities are normal bilaterally.   Toes:    The toes are downgoing bilaterally.   Clonus:    Clonus is absent.      Assessment/Plan:  Patient with worsening chronic daily headaches, fever, positional, visoon changes concerning need MRI brain for infectious process, space-occupying mass or other lesions.   Magnesium citrate 400mg  to 600mg  daily, riboflavin 400mg , Coenzyme Q 10 100mg  three times daily - Consider joining our Facebook group [facility]. - MRI brain w/wo contrat due to worsening headache, morning/positional headaches and to eval for infectious process due to fever - Medication overuse: use of ibuprofen,  tramadol and others most of the month. Discussed - Sleep eval: Snoring, excessive daytime fatigue, morning headaches - Will order LP after MRI if negative given  fevers and headache - Topiramate 25mg , 50mg , 75mg  then 100mg  - Maxalt at onset of headache may repeat in 2 hours if needed - zofran prn as needed for nausea or headache - Tizanidine for acute migraines as well, watch for sedation  Orders Placed This Encounter  Procedures  . MR BRAIN W WO CONTRAST  . Ambulatory referral to Sleep Studies    Discussed: To prevent or relieve headaches, try the following: Cool Compress. Lie down and place a cool compress on your head.  Avoid headache triggers. If certain foods or odors seem to have triggered your migraines in the past, avoid them. A headache diary might help you identify triggers.  Include physical activity in your daily routine. Try a daily walk or other moderate aerobic exercise.  Manage stress. Find healthy ways to cope with the stressors, such as delegating tasks on your to-do list.  Practice relaxation techniques. Try deep breathing, yoga, massage and visualization.  Eat regularly. Eating regularly scheduled meals and maintaining a healthy diet might help prevent headaches. Also, drink plenty of fluids.  Follow a regular sleep schedule. Sleep deprivation might contribute to headaches Consider biofeedback. With this mind-body technique, you learn to control certain bodily functions - such as muscle tension, heart rate and blood pressure - to prevent headaches or reduce headache pain.    Proceed to emergency room if you experience new or worsening symptoms or symptoms do not resolve, if you have new neurologic symptoms or if headache is severe, or for any concerning symptom.   Provided education and documentation from American headache Society toolbox including articles on: chronic migraine medication overuse headache, chronic migraines, prevention of migraines, behavioral and other nonpharmacologic treatments for headache.    Sarina Ill, MD  [facility] [address] [place], [place]  [address]  Phone [phone] Fax [phone]

## 2017-04-09 NOTE — Patient Instructions (Addendum)
Magnesium citrate 400mg  to 600mg  daily, riboflavin 400mg , Coenzyme Q 10 100mg  three times daily - Consider joining our Facebook group [facility]. - MRI brain w/wo contrat due to worsening headache, morning/positional headaches and to eval for infectious process due to fever - Medication overuse: use of ibuprofen, tramadol and others most of the month. Discussed - Sleep eval: Snoring, excessive daytime fatigue, morning headaches - Will order LP after MRI if negative given fevers and headache - Topiramate 25mg , 50mg , 75mg  then 100mg  - Maxalt at onset of headache may repeat in 2 hours if needed - zofran prn as needed for nausea or headache - Tizanidine for acute migraines as well, watch for sedation   Ondansetron tablets What is this medicine? ONDANSETRON (on DAN se tron) is used to treat nausea and vomiting caused by chemotherapy. It is also used to prevent or treat nausea and vomiting after surgery. This medicine may be used for other purposes; ask your health care provider or pharmacist if you have questions. COMMON BRAND NAME(S): Zofran What should I tell my health care provider before I take this medicine? They need to know if you have any of these conditions: -heart disease -history of irregular heartbeat -liver disease -low levels of magnesium or potassium in the blood -an unusual or allergic reaction to ondansetron, granisetron, other medicines, foods, dyes, or preservatives -pregnant or trying to get pregnant -breast-feeding How should I use this medicine? Take this medicine by mouth with a glass of water. Follow the directions on your prescription label. Take your doses at regular intervals. Do not take your medicine more often than directed. Talk to your pediatrician regarding the use of this medicine in children. Special care may be needed. Overdosage: If you think you have taken too much of this medicine contact a poison control center or emergency room at  once. NOTE: This medicine is only for you. Do not share this medicine with others. What if I miss a dose? If you miss a dose, take it as soon as you can. If it is almost time for your next dose, take only that dose. Do not take double or extra doses. What may interact with this medicine? Do not take this medicine with any of the following medications: -apomorphine -certain medicines for fungal infections like fluconazole, itraconazole, ketoconazole, posaconazole, voriconazole -cisapride -dofetilide -dronedarone -pimozide -thioridazine -ziprasidone This medicine may also interact with the following medications: -carbamazepine -certain medicines for depression, anxiety, or psychotic disturbances -fentanyl -linezolid -MAOIs like Carbex, Eldepryl, Marplan, Nardil, and Parnate -methylene blue (injected into a vein) -other medicines that prolong the QT interval (cause an abnormal heart rhythm) -phenytoin -rifampicin -tramadol This list may not describe all possible interactions. Give your health care provider a list of all the medicines, herbs, non-prescription drugs, or dietary supplements you use. Also tell them if you smoke, drink alcohol, or use illegal drugs. Some items may interact with your medicine. What should I watch for while using this medicine? Check with your doctor or health care professional right away if you have any sign of an allergic reaction. What side effects may I notice from receiving this medicine? Side effects that you should report to your doctor or health care professional as soon as possible: -allergic reactions like skin rash, itching or hives, swelling of the face, lips or tongue -breathing problems -confusion -dizziness -fast or irregular heartbeat -feeling faint or lightheaded, falls -fever and chills -loss of balance or coordination -seizures -sweating -swelling of the hands or feet -tightness  in the chest -tremors -unusually weak or tired Side  effects that usually do not require medical attention (report to your doctor or health care professional if they continue or are bothersome): -constipation or diarrhea -headache This list may not describe all possible side effects. Call your doctor for medical advice about side effects. You may report side effects to FDA at 1-800-FDA-1088. Where should I keep my medicine? Keep out of the reach of children. Store between 2 and 30 degrees C (36 and 86 degrees F). Throw away any unused medicine after the expiration date. NOTE: This sheet is a summary. It may not cover all possible information. If you have questions about this medicine, talk to your doctor, pharmacist, or health care provider.  2018 Elsevier/Gold Standard (2013-02-22 16:27:45)    Tizanidine tablets or capsules What is this medicine? TIZANIDINE (tye ZAN i deen) helps to relieve muscle spasms. It may be used to help in the treatment of multiple sclerosis and spinal cord injury. This medicine may be used for other purposes; ask your health care provider or pharmacist if you have questions. COMMON BRAND NAME(S): Zanaflex What should I tell my health care provider before I take this medicine? They need to know if you have any of these conditions: -kidney disease -liver disease -low blood pressure -mental disorder -an unusual or allergic reaction to tizanidine, other medicines, lactose (tablets only), foods, dyes, or preservatives -pregnant or trying to get pregnant -breast-feeding How should I use this medicine? Take this medicine by mouth with a full glass of water. Take this medicine on an empty stomach, at least 30 minutes before or 2 hours after food. Do not take with food unless you talk with your doctor. Follow the directions on the prescription label. Take your medicine at regular intervals. Do not take your medicine more often than directed. Do not stop taking except on your doctor's advice. Suddenly stopping the medicine  can be very dangerous. Talk to your pediatrician regarding the use of this medicine in children. Patients over 42 years old may have a stronger reaction and need a smaller dose. Overdosage: If you think you have taken too much of this medicine contact a poison control center or emergency room at once. NOTE: This medicine is only for you. Do not share this medicine with others. What if I miss a dose? If you miss a dose, take it as soon as you can. If it is almost time for your next dose, take only that dose. Do not take double or extra doses. What may interact with this medicine? Do not take this medicine with any of the following medications: -ciprofloxacin -cisapride -dofetilide -dronedarone -fluvoxamine -narcotic medicines for cough -pimozide -thiabendazole -thioridazine -ziprasidone This medicine may also interact with the following medications: -acyclovir -alcohol -antihistamines for allergy, cough and cold -baclofen -certain antibiotics like levofloxacin, ofloxacin -certain medicines for anxiety or sleep -certain medicines for blood pressure, heart disease, irregular heart beat -certain medicines for depression like amitriptyline, fluoxetine, sertraline -certain medicines for seizures like phenobarbital, primidone -certain medicines for stomach problems like cimetidine, famotidine -male hormones, like estrogens or progestins and birth control pills, patches, rings, or injections -general anesthetics like halothane, isoflurane, methoxyflurane, propofol -local anesthetics like lidocaine, pramoxine, tetracaine -medicines that relax muscles for surgery -narcotic medicines for pain -other medicines that prolong the QT interval (cause an abnormal heart rhythm) -phenothiazines like chlorpromazine, mesoridazine, prochlorperazine -ticlopidine -zileuton This list may not describe all possible interactions. Give your health care provider a list of all the  medicines, herbs,  non-prescription drugs, or dietary supplements you use. Also tell them if you smoke, drink alcohol, or use illegal drugs. Some items may interact with your medicine. What should I watch for while using this medicine? Tell your doctor or health care professional if your symptoms do not start to get better or if they get worse. You may get drowsy or dizzy. Do not drive, use machinery, or do anything that needs mental alertness until you know how this medicine affects you. Do not stand or sit up quickly, especially if you are an older patient. This reduces the risk of dizzy or fainting spells. Alcohol may interfere with the effect of this medicine. Avoid alcoholic drinks. If you are taking another medicine that also causes drowsiness, you may have more side effects. Give your health care provider a list of all medicines you use. Your doctor will tell you how much medicine to take. Do not take more medicine than directed. Call emergency for help if you have problems breathing or unusual sleepiness. Your mouth may get dry. Chewing sugarless gum or sucking hard candy, and drinking plenty of water may help. Contact your doctor if the problem does not go away or is severe. What side effects may I notice from receiving this medicine? Side effects that you should report to your doctor or health care professional as soon as possible: -allergic reactions like skin rash, itching or hives, swelling of the face, lips, or tongue -breathing problems -hallucinations -signs and symptoms of liver injury like dark yellow or brown urine; general ill feeling or flu-like symptoms; light-colored stools; loss of appetite; nausea; right upper quadrant belly pain; unusually weak or tired; yellowing of the eyes or skin -signs and symptoms of low blood pressure like dizziness; feeling faint or lightheaded, falls; unusually weak or tired -unusually slow heartbeat -unusually weak or tired Side effects that usually do not require  medical attention (report to your doctor or health care professional if they continue or are bothersome): -blurred vision -constipation -dizziness -dry mouth -tiredness This list may not describe all possible side effects. Call your doctor for medical advice about side effects. You may report side effects to FDA at 1-800-FDA-1088. Where should I keep my medicine? Keep out of the reach of children. Store at room temperature between 15 and 30 degrees C (59 and 86 degrees F). Throw away any unused medicine after the expiration date. NOTE: This sheet is a summary. It may not cover all possible information. If you have questions about this medicine, talk to your doctor, pharmacist, or health care provider.  2018 Elsevier/Gold Standard (2015-02-26 13:52:12)   Topiramate extended-release capsules What is this medicine? TOPIRAMATE (toe PYRE a mate) is used to treat seizures in adults or children with epilepsy. It is also used for the prevention of migraine headaches. This medicine may be used for other purposes; ask your health care provider or pharmacist if you have questions. COMMON BRAND NAME(S): Trokendi XR What should I tell my health care provider before I take this medicine? They need to know if you have any of these conditions: -cirrhosis of the liver or liver disease -diarrhea -glaucoma -kidney stones or kidney disease -lung disease like asthma, obstructive pulmonary disease, emphysema -metabolic acidosis -on a ketogenic diet -scheduled for surgery or a procedure -suicidal thoughts, plans, or attempt; a previous suicide attempt by you or a family member -an unusual or allergic reaction to topiramate, other medicines, foods, dyes, or preservatives -pregnant or trying to get pregnant -breast-feeding  How should I use this medicine? Take this medicine by mouth with a glass of water. Follow the directions on the prescription label. Trokendi XR capsules must be swallowed whole. Do not  sprinkle on food, break, crush, dissolve, or chew. Qudexy XR capsules may be swallowed whole or opened and sprinkled on a small amount of soft food. This mixture must be swallowed immediately. Do not chew or store mixture for later use. You may take this medicine with meals. Take your medicine at regular intervals. Do not take it more often than directed. Talk to your pediatrician regarding the use of this medicine in children. Special care may be needed. While Trokendi XR may be prescribed for children as young as 6 years and Qudexy XR may be prescribed for children as young as 2 years for selected conditions, precautions do apply. Overdosage: If you think you have taken too much of this medicine contact a poison control center or emergency room at once. NOTE: This medicine is only for you. Do not share this medicine with others. What if I miss a dose? If you miss a dose, take it as soon as you can. If it is almost time for your next dose, take only that dose. Do not take double or extra doses. What may interact with this medicine? Do not take this medicine with any of the following medications: -probenecid This medicine may also interact with the following medications: -acetazolamide -alcohol -amitriptyline -birth control pills -digoxin -hydrochlorothiazide -lithium -medicines for pain, sleep, or muscle relaxation -metformin -methazolamide -other seizure or epilepsy medicines -pioglitazone -risperidone This list may not describe all possible interactions. Give your health care provider a list of all the medicines, herbs, non-prescription drugs, or dietary supplements you use. Also tell them if you smoke, drink alcohol, or use illegal drugs. Some items may interact with your medicine. What should I watch for while using this medicine? Visit your doctor or health care professional for regular checks on your progress. Do not stop taking this medicine suddenly. This increases the risk of  seizures if you are using this medicine to control epilepsy. Wear a medical identification bracelet or chain to say you have epilepsy or seizures, and carry a card that lists all your medicines. This medicine can decrease sweating and increase your body temperature. Watch for signs of deceased sweating or fever, especially in children. Avoid extreme heat, hot baths, and saunas. Be careful about exercising, especially in hot weather. Contact your health care provider right away if you notice a fever or decrease in sweating. You should drink plenty of fluids while taking this medicine. If you have had kidney stones in the past, this will help to reduce your chances of forming kidney stones. If you have stomach pain, with nausea or vomiting and yellowing of your eyes or skin, call your doctor immediately. You may get drowsy, dizzy, or have blurred vision. Do not drive, use machinery, or do anything that needs mental alertness until you know how this medicine affects you. To reduce dizziness, do not sit or stand up quickly, especially if you are an older patient. Alcohol can increase drowsiness and dizziness. Avoid alcoholic drinks. Do not drink alcohol for 6 hours before or 6 hours after taking Trokendi XR. If you notice blurred vision, eye pain, or other eye problems, seek medical attention at once for an eye exam. The use of this medicine may increase the chance of suicidal thoughts or actions. Pay special attention to how you are responding while on  this medicine. Any worsening of mood, or thoughts of suicide or dying should be reported to your health care professional right away. This medicine may increase the chance of developing metabolic acidosis. If left untreated, this can cause kidney stones, bone disease, or slowed growth in children. Symptoms include breathing fast, fatigue, loss of appetite, irregular heartbeat, or loss of consciousness. Call your doctor immediately if you experience any of these  side effects. Also, tell your doctor about any surgery you plan on having while taking this medicine since this may increase your risk for metabolic acidosis. Birth control pills may not work properly while you are taking this medicine. Talk to your doctor about using an extra method of birth control. Women who become pregnant while using this medicine may enroll in the [place] Pregnancy Registry by calling [phone]. This registry collects information about the safety of antiepileptic drug use during pregnancy. What side effects may I notice from receiving this medicine? Side effects that you should report to your doctor or health care professional as soon as possible: -allergic reactions like skin rash, itching or hives, swelling of the face, lips, or tongue -decreased sweating and/or rise in body temperature -depression -difficulty breathing, fast or irregular breathing patterns -difficulty speaking -difficulty walking or controlling muscle movements -hearing impairment -redness, blistering, peeling or loosening of the skin, including inside the mouth -tingling, pain or numbness in the hands or feet -unusually weak or tired -worsening of mood, thoughts or actions of suicide or dying Side effects that usually do not require medical attention (report to your doctor or health care professional if they continue or are bothersome): -altered taste -back pain, joint or muscle aches and pains -diarrhea, or constipation -headache -loss of appetite -nausea -stomach upset, indigestion -tremors This list may not describe all possible side effects. Call your doctor for medical advice about side effects. You may report side effects to FDA at 1-800-FDA-1088. Where should I keep my medicine? Keep out of the reach of children. Store at room temperature between 15 and 30 degrees C (59 and 86 degrees F) in a tightly closed container. Protect from moisture. Throw away any  unused medicine after the expiration date. NOTE: This sheet is a summary. It may not cover all possible information. If you have questions about this medicine, talk to your doctor, pharmacist, or health care provider.  2018 Elsevier/Gold Standard (2015-09-06 12:33:11)

## 2017-04-12 ENCOUNTER — Other Ambulatory Visit: Payer: Self-pay | Admitting: Neurology

## 2017-04-12 ENCOUNTER — Telehealth: Payer: Self-pay | Admitting: Family Medicine

## 2017-04-12 MED ORDER — ALPRAZOLAM 0.25 MG PO TABS: ORAL_TABLET | ORAL | 0 refills | Status: DC

## 2017-04-12 NOTE — Telephone Encounter (Signed)
Agree.  Signed:  Crissie Sickles, MD           04/12/2017

## 2017-04-12 NOTE — Telephone Encounter (Signed)
Patient called requesting lab results.  Spoke with Dr Anitra Lauth, advised patient all labs were normal.

## 2017-04-12 NOTE — Telephone Encounter (Signed)
Completed, thanks

## 2017-04-12 NOTE — Telephone Encounter (Addendum)
Prescription for Xanax for use prior to getting MRI faxed to [facility] pharmacy. Received a receipt of confirmation.  Called and LVM on pt's cell (ok per DPR) informing pt of prescription for Xanax 0.25 mg tablets with the following instructions: Take 1-2 tabs 30-60 minutes before MRI. May repeat if needed. If you are going to take this medication do not drive. He was told to call with any questions, but this message does not require a call back. The prescription was faxed to his pharmacy.

## 2017-04-13 ENCOUNTER — Ambulatory Visit (HOSPITAL_BASED_OUTPATIENT_CLINIC_OR_DEPARTMENT_OTHER)
Admission: RE | Admit: 2017-04-13 | Discharge: 2017-04-13 | Disposition: A | Discharge status: Home / Self Care | Payer: BLUE CROSS/BLUE SHIELD | Source: Ambulatory Visit | Attending: Family Medicine | Admitting: Family Medicine

## 2017-04-13 DIAGNOSIS — J329 Chronic sinusitis, unspecified: Secondary | ICD-10-CM | POA: Diagnosis not present

## 2017-04-13 DIAGNOSIS — J3489 Other specified disorders of nose and nasal sinuses: Secondary | ICD-10-CM

## 2017-04-13 DIAGNOSIS — J322 Chronic ethmoidal sinusitis: Secondary | ICD-10-CM | POA: Diagnosis not present

## 2017-04-13 DIAGNOSIS — R5081 Fever presenting with conditions classified elsewhere: Secondary | ICD-10-CM | POA: Diagnosis not present

## 2017-04-14 ENCOUNTER — Encounter: Payer: Self-pay | Admitting: *Deleted

## 2017-04-14 ENCOUNTER — Ambulatory Visit (INDEPENDENT_AMBULATORY_CARE_PROVIDER_SITE_OTHER): Payer: BLUE CROSS/BLUE SHIELD

## 2017-04-14 ENCOUNTER — Telehealth: Payer: Self-pay | Admitting: Neurology

## 2017-04-14 DIAGNOSIS — R519 Headache, unspecified: Secondary | ICD-10-CM

## 2017-04-14 DIAGNOSIS — H539 Unspecified visual disturbance: Secondary | ICD-10-CM

## 2017-04-14 DIAGNOSIS — G4719 Other hypersomnia: Secondary | ICD-10-CM | POA: Diagnosis not present

## 2017-04-14 DIAGNOSIS — R509 Fever, unspecified: Secondary | ICD-10-CM

## 2017-04-14 DIAGNOSIS — R42 Dizziness and giddiness: Secondary | ICD-10-CM

## 2017-04-14 DIAGNOSIS — R51 Headache with orthostatic component, not elsewhere classified: Secondary | ICD-10-CM

## 2017-04-14 MED ORDER — GADOPENTETATE DIMEGLUMINE 469.01 MG/ML IV SOLN: 18.0000 mL | Freq: Once | INTRAVENOUS | Status: AC | PRN

## 2017-04-14 MED ORDER — RIZATRIPTAN BENZOATE 10 MG PO TBDP: 10.0000 mg | ORAL_TABLET | ORAL | 0 refills | Status: DC | PRN

## 2017-04-14 NOTE — Telephone Encounter (Signed)
Called and LVM (ok per DPR) informing patient of new prescription for Maxalt from Dr. Brett Fairy. Take 1 tab at onset of migraine, may repeat in 2 hours. No more than 2 tabs in 24 hour period. I informed him that pharmacy will have instructions and can answer any questions he has about the medication. I told him to call our office if he has any questions and I left the number.

## 2017-04-14 NOTE — Telephone Encounter (Signed)
maxalt 10 mg , 10 tabs, up to 2 in 24 hours. CD

## 2017-04-14 NOTE — Telephone Encounter (Signed)
Will d/w work in [place]. Maxalt mentioned in latest office note but no prescription written.

## 2017-04-14 NOTE — Addendum Note (Signed)
Addended by: Larey Seat on: 04/14/2017 05:29 PM   Modules accepted: Orders

## 2017-04-14 NOTE — Telephone Encounter (Signed)
Pt has a question on the Maxalt that was supposed to be called in for him but it still has not been called in. Please call the pt when you get a chance. Thank you. JBA

## 2017-04-19 ENCOUNTER — Ambulatory Visit (INDEPENDENT_AMBULATORY_CARE_PROVIDER_SITE_OTHER): Payer: BLUE CROSS/BLUE SHIELD | Admitting: Neurology

## 2017-04-19 ENCOUNTER — Encounter: Payer: Self-pay | Admitting: Neurology

## 2017-04-19 VITALS — BP 123/79 | HR 81 | Ht 69.0 in | Wt 191.0 lb

## 2017-04-19 DIAGNOSIS — R0683 Snoring: Secondary | ICD-10-CM | POA: Diagnosis not present

## 2017-04-19 DIAGNOSIS — R519 Headache, unspecified: Secondary | ICD-10-CM

## 2017-04-19 DIAGNOSIS — G4719 Other hypersomnia: Secondary | ICD-10-CM | POA: Diagnosis not present

## 2017-04-19 DIAGNOSIS — R351 Nocturia: Secondary | ICD-10-CM

## 2017-04-19 DIAGNOSIS — R0681 Apnea, not elsewhere classified: Secondary | ICD-10-CM | POA: Diagnosis not present

## 2017-04-19 DIAGNOSIS — R51 Headache: Secondary | ICD-10-CM | POA: Diagnosis not present

## 2017-04-19 DIAGNOSIS — E663 Overweight: Secondary | ICD-10-CM

## 2017-04-19 NOTE — Patient Instructions (Signed)

## 2017-04-19 NOTE — Progress Notes (Signed)
Subjective:    Patient ID: Benjamin [name] is a 46 y.o. male.  HPI     Star Age, MD, PhD [facility] [address]O. [place], [place] [address]  Dear [name],   I saw your patient, Benjamin [name], upon your kind request in my clinic today for initial consultation of his sleep disorder, in particular, concern for underlying obstructive sleep apnea. The patient is unaccompanied today. As you know, [name] is a 46 year old right-handed gentleman with an underlying medical history of allergic rhinitis, recurrent headaches, chronic back pain, reflux disease, anxiety, history of diverticulosis, hypertension, psoriasis, and overweight state, who reports snoring and excessive daytime somnolence as well as morning headaches. I reviewed your office note from 04/09/2017. His Epworth sleepiness score is 8 out of 24, fatigue score is 41 out of 63. He is a Hotel manager, some road travel involved. He denies telltale symptoms of restless leg syndrome. He lives with his girlfriend and his 75-month-old daughter. He is a nonsmoker and drinks alcohol occasionally, caffeine in the form of coffee 1 cup in the morning and maybe a bottle or bottle and a half of soda per day. He has nocturia 2-3 times per average night and morning headaches occasionally. He recently started long-acting Topamax for migraine prevention. He has also recently started losing some weight in the past couple months. His younger brother has sleep apnea and uses a CPAP machine. He reports that he snores and his girlfriend has also witnessed apneic pauses while he is asleep. Bedtime is around 10:30 and he falls asleep okay. He does wake up in the middle of the night. Wake time is in the mornings around 6.  His Past Medical History Is Significant For: Past Medical History:  Diagnosis Date  . Adenomatous colon polyp 10/04/15  . ALLERGIC RHINITIS 07/11/2008  . Allergy    SEASONAL  . 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 Anxiety   . BACK PAIN, CHRONIC, INTERMITTENT 10/17/2008  . Barrett's esophagus determined by biopsy 04/12/14  . BENIGN PROSTATIC HYPERTROPHY, WITH URINARY OBSTRUCTION 07/21/2007  . BURSITIS, HIP 09/30/2009  . CAP (community acquired pneumonia) 06/2014  . Cataracts, bilateral 2016   Not visually significant  . Chronic headaches    tramadol  . Chronic renal insufficiency, stage II (mild)    GFR 60s-70s  . Diverticulitis 2016   CT 01/2015  . Diverticulitis   . Frequent headaches   . Gastric polyp 2006  . GERD without esophagitis   . Herpes labialis   . Hiatal hernia   . Hyperlipidemia   . HYPERTENSION 01/05/2007  . PSORIASIS 01/05/2007    His Past Surgical History Is Significant For: Past Surgical History:  Procedure Laterality Date  . COLONOSCOPY W/ POLYPECTOMY  10/04/15   90mm tubular adenoma + diverticulosis: recall 5 yrs (Dr. Fuller Plan)  . ESOPHAGOGASTRODUODENOSCOPY  04/12/14   Barrett's esophagus  . LIPOMA EXCISION     several, arm    His Family History Is Significant For: Family History  Problem Relation Age of Onset  . CAD Father        around age 56  . Heart disease Father   . Hyperlipidemia Father   . Hypertension Father   . CAD Paternal Grandmother   . Diabetes Paternal Grandmother   . Hypertension Mother   . Hypertension Brother   . Hyperlipidemia Brother   . Lung cancer Maternal Grandmother   . Cancer Maternal Grandfather  type unknown  . Colon cancer Unknown        neg hx  . Prostate cancer Unknown        neg hx    His Social History Is Significant For: Social History   Socioeconomic History  . Marital status: Single    Spouse name: None  . Number of children: 2  . Years of education: None  . Highest education level: None  Social Needs  . Financial resource strain: None  . Food insecurity - worry: None  . Food insecurity - inability: None  . Transportation needs - medical: None  . Transportation needs - non-medical: None   Occupational History  . Occupation: regional Veterinary surgeon    Comment: Network engineer  Tobacco Use  . Smoking status: Never Smoker  . Smokeless tobacco: Never Used  Substance and Sexual Activity  . Alcohol use: Yes    Alcohol/week: 1.2 oz    Types: 2 Cans of beer per week    Comment: occasionally  . Drug use: No  . Sexual activity: None  Other Topics Concern  . None  Social History Narrative   Currently separated as of 01/2014, 2 children.  One younger brother.   Occupation: Press photographer for [employer]   No tobacco.   Occ alcohol (1-2 x/month).  No hx of alc/drug prob.   Exercise: runs about 2 times a week. Not as much lately due to medical state   Diet: regular american diet.   Right handed   1 cup coffee daily       His Allergies Are:  Allergies  Allergen Reactions  . Compazine [Prochlorperazine Edisylate] Other (See Comments)    Cervical dystonia and panic attack  . Penicillins Other (See Comments)    Childhood allergy Has patient had a PCN reaction causing immediate rash, facial/tongue/throat swelling, SOB or lightheadedness with hypotension: unknown Has patient had a PCN reaction causing severe rash involving mucus membranes or skin necrosis: Unknown Has patient had a PCN reaction that required hospitalization: Unknown Has patient had a PCN reaction occurring within the last 10 years: Unknown If all of the above answers are "NO", then may proceed with Cephalosporin use.    . Pepto-Bismol [Bismuth Subsalicylate] Swelling  :   His Current Medications Are:  Outpatient Encounter Medications as of 04/19/2017  Medication Sig  . ALPRAZolam (XANAX) 0.25 MG tablet Take 1-2 tabs 30-60 minutes before MRI may repeat if needed. Do not drive.  . bifidobacterium infantis (ALIGN) capsule Take 1 capsule by mouth daily.  . bisoprolol-hydrochlorothiazide (ZIAC) 5-6.25 MG tablet TAKE 1 TABLET BY MOUTH DAILY.  Marland Kitchen buPROPion (WELLBUTRIN XL) 300 MG 24 hr tablet Take 1 tablet (300 mg  total) by mouth daily.  . butalbital-acetaminophen-caffeine (FIORICET, ESGIC) 50-325-40 MG tablet Take 1-2 tablets by mouth every 6 (six) hours as needed for headache.  . fluticasone (FLONASE) 50 MCG/ACT nasal spray Place 2 sprays into both nostrils daily.  . hyoscyamine (LEVSIN SL) 0.125 MG SL tablet Place 1 tablet (0.125 mg total) under the tongue every 4 (four) hours as needed for cramping.  . nystatin (MYCOSTATIN) 100000 UNIT/ML suspension 1 tsp po swish and swallow qid x 14 days  . ondansetron (ZOFRAN) 4 MG tablet Take 1 tablet (4 mg total) by mouth every 8 (eight) hours as needed for nausea or vomiting.  . ondansetron (ZOFRAN-ODT) 4 MG disintegrating tablet Take 1 tablet (4 mg total) every 8 (eight) hours as needed by mouth for nausea. Or for headache.  . RABEprazole (  ACIPHEX) 20 MG tablet Take 1 tablet (20 mg total) by mouth daily.  . rizatriptan (MAXALT-MLT) 10 MG disintegrating tablet Take 1 tablet (10 mg total) as needed by mouth for migraine. May repeat in 2 hours if needed  . rosuvastatin (CRESTOR) 40 MG tablet TAKE 1 TABLET BY MOUTH ONCE A WEEK.  . tamsulosin (FLOMAX) 0.4 MG CAPS capsule TAKE 1 CAPSULE BY MOUTH EVERY DAY  . tiZANidine (ZANAFLEX) 4 MG tablet Take 1 tablet (4 mg total) every 6 (six) hours as needed by mouth for muscle spasms. Or for headaches.  . Topiramate ER (QUDEXY XR) 100 MG CS24 sprinkle capsule Take 100 mg at bedtime as needed by mouth.  . traMADol (ULTRAM) 50 MG tablet Take 1 tablet (50 mg total) by mouth every 6 (six) hours as needed for moderate pain.  . valACYclovir (VALTREX) 500 MG tablet Take 1 tablet (500 mg total) by mouth daily. (Patient taking differently: Take 500 mg daily as needed by mouth. )   Facility-Administered Encounter Medications as of 04/19/2017  Medication  . gadopentetate dimeglumine (MAGNEVIST) injection 18 mL  :  Review of Systems:  Out of a complete 14 point review of systems, all are reviewed and negative with the exception of these  symptoms as listed below: Review of Systems  Neurological:       Pt presents today to discuss his sleep. Pt has never had a sleep study but does endorse snoring.  Epworth Sleepiness Scale 0= would never doze 1= slight chance of dozing 2= moderate chance of dozing 3= high chance of dozing  Sitting and reading: 1 Watching TV: 2 Sitting inactive in a public place (ex. Theater or meeting): 0 As a passenger in a car for an hour without a break: 1 Lying down to rest in the afternoon: 3 Sitting and talking to someone: 0 Sitting quietly after lunch (no alcohol): 1 In a car, while stopped in traffic: 0 Total: 8     Objective:  Neurological Exam  Physical Exam Physical Examination:   Vitals:   04/19/17 1329  BP: 123/79  Pulse: 81    General Examination: The patient is a very pleasant 46 y.o. male in no acute distress. He appears well-developed and well-nourished and well groomed.   HEENT: Normocephalic, atraumatic, pupils are equal, round and reactive to light and accommodation. Funduscopic exam is normal with sharp disc margins noted. Extraocular tracking is good without limitation to gaze excursion or nystagmus noted. Normal smooth pursuit is noted. Hearing is grossly intact. Tympanic membranes are clear bilaterally. Face is symmetric with normal facial animation and normal facial sensation. Speech is clear with no dysarthria noted. There is no hypophonia. There is no lip, neck/head, jaw or voice tremor. Neck is supple with full range of passive and active motion. There are no carotid bruits on auscultation. Oropharynx exam reveals: moderate mouth dryness, adequate dental hygiene and moderate airway crowding, due to redundant soft palate, floppy uvula, tonsils in place. Mallampati is class II. Tongue protrudes centrally and palate elevates symmetrically. Tonsils are 1+ to 2+ in size. Neck size is 16.5 inches. He has a minimal overbite, mildly malaligned teeth.   Chest: Clear to  auscultation without wheezing, rhonchi or crackles noted.  Heart: S1+S2+0, regular and normal without murmurs, rubs or gallops noted.   Abdomen: Soft, non-tender and non-distended with normal bowel sounds appreciated on auscultation.  Extremities: There is no pitting edema in the distal lower extremities bilaterally. Pedal pulses are intact.  Skin: Warm and dry without  trophic changes noted.  Musculoskeletal: exam reveals no obvious joint deformities, tenderness or joint swelling or erythema.   Neurologically:  Mental status: The patient is awake, alert and oriented in all 4 spheres. His immediate and remote memory, attention, language skills and fund of knowledge are appropriate. There is no evidence of aphasia, agnosia, apraxia or anomia. Speech is clear with normal prosody and enunciation. Thought process is linear. Mood is normal and affect is normal.  Cranial nerves II - XII are as described above under HEENT exam. In addition: shoulder shrug is normal with equal shoulder height noted. Motor exam: Normal bulk, strength and tone is noted. There is no drift, tremor or rebound. Romberg is negative. Reflexes are 2+ throughout. Fine motor skills and coordination: intact with normal finger taps, normal hand movements, normal rapid alternating patting, normal foot taps and normal foot agility.  Cerebellar testing: No dysmetria or intention tremor on finger to nose testing. Heel to shin is unremarkable bilaterally. There is no truncal or gait ataxia.  Sensory exam: intact to light touch in the upper and lower extremities.  Gait, station and balance: He stands easily. No veering to one side is noted. No leaning to one side is noted. Posture is age-appropriate and stance is narrow based. Gait shows normal stride length and normal pace. No problems turning are noted. Tandem walk is unremarkable.   Assessment and Plan:  In summary, DRAYDON CLAIRMONT is a very pleasant 46 y.o.-year old male with an  underlying medical history of allergic rhinitis, recurrent headaches, chronic back pain, reflux disease, anxiety, history of diverticulosis, hypertension, psoriasis, and overweight state, whose history and physical exam are concerning for obstructive sleep apnea (OSA). I had a long chat with the patient about my findings and the diagnosis of OSA, its prognosis and treatment options. We talked about medical treatments, surgical interventions and non-pharmacological approaches. I explained in particular the risks and ramifications of untreated moderate to severe OSA, especially with respect to developing cardiovascular disease down the Road, including congestive heart failure, difficult to treat hypertension, cardiac arrhythmias, or stroke. Even type 2 diabetes has, in part, been linked to untreated OSA. Symptoms of untreated OSA include daytime sleepiness, memory problems, mood irritability and mood disorder such as depression and anxiety, lack of energy, as well as recurrent headaches, especially morning headaches. We talked about trying to maintain a healthy lifestyle in general, as well as the importance of weight control. I encouraged the patient to eat healthy, exercise daily and keep well hydrated, to keep a scheduled bedtime and wake time routine, to not skip any meals and eat healthy snacks in between meals. I advised the patient not to drive when feeling sleepy. I recommended the following at this time: sleep study with potential positive airway pressure titration. (We will score hypopneas at 3%).   I explained the sleep test procedure to the patient and also outlined possible surgical and non-surgical treatment options of OSA, including the use of a custom-made dental device (which would require a referral to a specialist dentist or oral surgeon), upper airway surgical options, such as pillar implants, radiofrequency surgery, tongue base surgery, and UPPP (which would involve a referral to an ENT  surgeon). Rarely, jaw surgery such as mandibular advancement may be considered.  I also explained the CPAP treatment option to the patient, who indicated that he would be willing to try CPAP if the need arises. I explained the importance of being compliant with PAP treatment, not only for insurance purposes  but primarily to improve His symptoms, and for the patient's long term health benefit, including to reduce His cardiovascular risks. I answered all his questions today and the patient was in agreement. I will likely see him back after the sleep study is completed and encouraged him to call with any interim questions, concerns, problems or updates.   Thank you very much for allowing me to participate in the care of this nice patient. If I can be of any further assistance to you please do not hesitate to talk to me.  Sincerely,   Star Age, MD, PhD

## 2017-04-20 ENCOUNTER — Telehealth: Payer: Self-pay | Admitting: *Deleted

## 2017-04-20 NOTE — Telephone Encounter (Signed)
Called and informed patient that his MRI of his brain is normal. He verbalized understanding and had no questions.

## 2017-04-20 NOTE — Telephone Encounter (Signed)
-----   Message from Melvenia Beam, MD sent at 04/19/2017  4:57 PM EST ----- MRI of the brain was normal, thanks

## 2017-04-24 ENCOUNTER — Emergency Department (HOSPITAL_COMMUNITY)
Admission: EM | Admit: 2017-04-24 | Discharge: 2017-04-24 | Disposition: A | Discharge status: Home / Self Care | Payer: BLUE CROSS/BLUE SHIELD | Attending: Emergency Medicine | Admitting: Emergency Medicine

## 2017-04-24 ENCOUNTER — Emergency Department (HOSPITAL_BASED_OUTPATIENT_CLINIC_OR_DEPARTMENT_OTHER)
Admit: 2017-04-24 | Discharge: 2017-04-24 | Disposition: A | Discharge status: Home / Self Care | Payer: BLUE CROSS/BLUE SHIELD | Attending: Emergency Medicine | Admitting: Emergency Medicine

## 2017-04-24 ENCOUNTER — Other Ambulatory Visit: Payer: Self-pay

## 2017-04-24 ENCOUNTER — Encounter (HOSPITAL_COMMUNITY): Payer: Self-pay | Admitting: Nurse Practitioner

## 2017-04-24 DIAGNOSIS — M79662 Pain in left lower leg: Secondary | ICD-10-CM | POA: Diagnosis not present

## 2017-04-24 DIAGNOSIS — M7989 Other specified soft tissue disorders: Secondary | ICD-10-CM | POA: Diagnosis not present

## 2017-04-24 DIAGNOSIS — Z79899 Other long term (current) drug therapy: Secondary | ICD-10-CM | POA: Diagnosis not present

## 2017-04-24 DIAGNOSIS — M79609 Pain in unspecified limb: Secondary | ICD-10-CM

## 2017-04-24 DIAGNOSIS — J302 Other seasonal allergic rhinitis: Secondary | ICD-10-CM | POA: Diagnosis not present

## 2017-04-24 DIAGNOSIS — I10 Essential (primary) hypertension: Secondary | ICD-10-CM | POA: Diagnosis not present

## 2017-04-24 DIAGNOSIS — I82442 Acute embolism and thrombosis of left tibial vein: Secondary | ICD-10-CM

## 2017-04-24 HISTORY — DX: Acute embolism and thrombosis of left tibial vein: I82.442

## 2017-04-24 LAB — I-STAT CHEM 8, ED
BUN: 15 mg/dL (ref 6–20)
Calcium, Ion: 1.24 mmol/L (ref 1.15–1.40)
Chloride: 106 mmol/L (ref 101–111)
Creatinine, Ser: 1.3 mg/dL — ABNORMAL HIGH (ref 0.61–1.24)
Glucose, Bld: 102 mg/dL — ABNORMAL HIGH (ref 65–99)
HCT: 39 % (ref 39.0–52.0)
Hemoglobin: 13.3 g/dL (ref 13.0–17.0)
Potassium: 3.6 mmol/L (ref 3.5–5.1)
Sodium: 141 mmol/L (ref 135–145)
TCO2: 25 mmol/L (ref 22–32)

## 2017-04-24 MED ORDER — RIVAROXABAN (XARELTO) VTE STARTER PACK (15 & 20 MG): ORAL_TABLET | ORAL | 0 refills | Status: DC

## 2017-04-24 MED ORDER — RIVAROXABAN 15 MG PO TABS
15.0000 mg | ORAL_TABLET | Freq: Once | ORAL | Status: AC
  Administered 2017-04-24: 15 mg via ORAL
  Filled 2017-04-24: qty 1

## 2017-04-24 NOTE — ED Provider Notes (Signed)
[facility] DEPT Provider Note   CSN: [id number] Arrival date & time: 04/24/17  1647     History   Chief Complaint Chief Complaint  Patient presents with  . Calf Pain    Left    HPI Benjamin [name] is a 46 y.o. male who presents with 3 days of left calf pain and swelling.  Patient reports that initially when symptoms began, the area was red and hot but states that has resolved.  He states that this morning, he noticed that his foot was slightly red and swollen but states that has improved.  He has continued to have persistent calf pain, prompting ED visit.  He states that it is worse with movement of the leg.  He has not tried any medications for the pain.  He describes the pain as a throbbing pain.  Patient denies any recent hormone use, surgeries, bedrest, hospitalizations, long travel, history of blood clots in his legs or his lungs.  Patient denies any chest pain, difficulty breathing, fever, abdominal pain, nausea/vomiting.  The history is provided by the patient.    Past Medical History:  Diagnosis Date  . Adenomatous colon polyp 10/04/15  . ALLERGIC RHINITIS 07/11/2008  . Allergy    SEASONAL  . 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 Anxiety   . BACK PAIN, CHRONIC, INTERMITTENT 10/17/2008  . Barrett's esophagus determined by biopsy 04/12/14  . BENIGN PROSTATIC HYPERTROPHY, WITH URINARY OBSTRUCTION 07/21/2007  . BURSITIS, HIP 09/30/2009  . CAP (community acquired pneumonia) 06/2014  . Cataracts, bilateral 2016   Not visually significant  . Chronic headaches    tramadol  . Chronic renal insufficiency, stage II (mild)    GFR 60s-70s  . Diverticulitis 2016   CT 01/2015  . Diverticulitis   . Frequent headaches   . Gastric polyp 2006  . GERD without esophagitis   . Herpes labialis   . Hiatal hernia   . Hyperlipidemia   . HYPERTENSION 01/05/2007  . PSORIASIS 01/05/2007    Patient Active Problem List   Diagnosis Date Noted  . Chronic  migraine without aura, intractable, with status migrainosus 04/09/2017  . New daily persistent headache 03/29/2017  . Maxillary sinusitis 03/29/2017  . Urinary frequency 09/02/2015  . Rash and nonspecific skin eruption 09/02/2015  . LLQ abdominal pain 08/09/2015  . History of diverticulitis 08/09/2015  . LLQ pain 08/06/2015  . Nausea without vomiting 08/06/2015  . Diverticulitis of colon 07/02/2015  . BMI 26.0-26.9,adult 09/17/2014  . Health maintenance examination 06/28/2014  . Adjustment disorder with mixed anxiety and depressed mood 02/19/2014  . Chronic cluster headache, not intractable 02/19/2014  . Herpes labialis 05/17/2012  . BURSITIS, HIP 09/30/2009  . BACK PAIN, CHRONIC, INTERMITTENT 10/17/2008  . ALLERGIC RHINITIS 07/11/2008  . BPH (benign prostatic hypertrophy) with urinary obstruction 07/21/2007  . Essential hypertension 01/05/2007  . PSORIASIS 01/05/2007    Past Surgical History:  Procedure Laterality Date  . COLONOSCOPY W/ POLYPECTOMY  10/04/15   78mm tubular adenoma + diverticulosis: recall 5 yrs (Dr. Fuller Plan)  . ESOPHAGOGASTRODUODENOSCOPY  04/12/14   Barrett's esophagus  . LIPOMA EXCISION     several, arm       Home Medications    Prior to Admission medications   Medication Sig Start Date End Date Taking? Authorizing Provider  ALPRAZolam (XANAX) 0.25 MG tablet Take 1-2 tabs 30-60 minutes before MRI may repeat if needed. Do not drive. 04/12/17   Melvenia Beam, MD  bifidobacterium infantis (ALIGN)  capsule Take 1 capsule by mouth daily.    [provider]  bisoprolol-hydrochlorothiazide (ZIAC) 5-6.25 MG tablet TAKE 1 TABLET BY MOUTH DAILY. 03/05/17   McGowen, Adrian Blackwater, MD  buPROPion (WELLBUTRIN XL) 300 MG 24 hr tablet Take 1 tablet (300 mg total) by mouth daily. 01/11/17   McGowen, Adrian Blackwater, MD  butalbital-acetaminophen-caffeine (FIORICET, ESGIC) [phone] MG tablet Take 1-2 tablets by mouth every 6 (six) hours as needed for headache. 03/28/17 03/28/18   [name], PA-C  fluticasone (FLONASE) 50 MCG/ACT nasal spray Place 2 sprays into both nostrils daily. 09/11/14   McGowen, Adrian Blackwater, MD  hyoscyamine (LEVSIN SL) 0.125 MG SL tablet Place 1 tablet (0.125 mg total) under the tongue every 4 (four) hours as needed for cramping. 10/04/15   Ladene Artist, MD  nystatin (MYCOSTATIN) 100000 UNIT/ML suspension 1 tsp po swish and swallow qid x 14 days 04/06/17   McGowen, Adrian Blackwater, MD  ondansetron (ZOFRAN) 4 MG tablet Take 1 tablet (4 mg total) by mouth every 8 (eight) hours as needed for nausea or vomiting. 03/29/17   Kuneff, Renee A, DO  ondansetron (ZOFRAN-ODT) 4 MG disintegrating tablet Take 1 tablet (4 mg total) every 8 (eight) hours as needed by mouth for nausea. Or for headache. 04/09/17   Melvenia Beam, MD  RABEprazole (ACIPHEX) 20 MG tablet Take 1 tablet (20 mg total) by mouth daily. 12/06/16   McGowen, Adrian Blackwater, MD  Rivaroxaban 15 & 20 MG TBPK Take as directed on package: Start with one 15mg  tablet by mouth twice a day with food. On Day 22, switch to one 20mg  tablet once a day with food. 04/24/17   Volanda Napoleon, PA-C  rizatriptan (MAXALT-MLT) 10 MG disintegrating tablet Take 1 tablet (10 mg total) as needed by mouth for migraine. May repeat in 2 hours if needed 04/14/17   Dohmeier, Asencion Partridge, MD  rosuvastatin (CRESTOR) 40 MG tablet TAKE 1 TABLET BY MOUTH ONCE A WEEK. 12/30/16   McGowen, Adrian Blackwater, MD  tamsulosin (FLOMAX) 0.4 MG CAPS capsule TAKE 1 CAPSULE BY MOUTH EVERY DAY 01/18/17   McGowen, Adrian Blackwater, MD  tiZANidine (ZANAFLEX) 4 MG tablet Take 1 tablet (4 mg total) every 6 (six) hours as needed by mouth for muscle spasms. Or for headaches. 04/09/17   Melvenia Beam, MD  Topiramate ER (QUDEXY XR) 100 MG CS24 sprinkle capsule Take 100 mg at bedtime as needed by mouth. 04/09/17   Melvenia Beam, MD  traMADol (ULTRAM) 50 MG tablet Take 1 tablet (50 mg total) by mouth every 6 (six) hours as needed for moderate pain. 10/23/16   McGowen, Adrian Blackwater, MD    valACYclovir (VALTREX) 500 MG tablet Take 1 tablet (500 mg total) by mouth daily. Patient taking differently: Take 500 mg daily as needed by mouth.  08/05/16   McGowen, Adrian Blackwater, MD    Family History Family History  Problem Relation Age of Onset  . CAD Father        around age 68  . Heart disease Father   . Hyperlipidemia Father   . Hypertension Father   . CAD Paternal Grandmother   . Diabetes Paternal Grandmother   . Hypertension Mother   . Hypertension Brother   . Hyperlipidemia Brother   . Lung cancer Maternal Grandmother   . Cancer Maternal Grandfather        type unknown  . Colon cancer Unknown        neg hx  . Prostate cancer Unknown  neg hx    Social History Social History   Tobacco Use  . Smoking status: Never Smoker  . Smokeless tobacco: Never Used  Substance Use Topics  . Alcohol use: Yes    Alcohol/week: 1.2 oz    Types: 2 Cans of beer per week    Comment: occasionally  . Drug use: No     Allergies   Compazine [prochlorperazine edisylate]; Penicillins; and Pepto-bismol [bismuth subsalicylate]   Review of Systems Review of Systems  Constitutional: Negative for fever.  Respiratory: Negative for shortness of breath.   Cardiovascular: Positive for leg swelling. Negative for chest pain.  Gastrointestinal: Negative for nausea and vomiting.  Skin: Positive for color change.     Physical Exam Updated Vital Signs BP 123/89 (BP Location: Right Arm)   Pulse (P) 85   Temp (P) 98.1 F (36.7 C) (Oral)   Resp (P) 18   Ht 5\' 9"  (1.753 m)   Wt 86.2 kg (190 lb)   SpO2 (P) 100%   BMI 28.06 kg/m   Physical Exam  Constitutional: He is oriented to person, place, and time. He appears well-developed and well-nourished.  Sitting comfortably on examination table  HENT:  Head: Normocephalic and atraumatic.  Mouth/Throat: Oropharynx is clear and moist and mucous membranes are normal.  Eyes: Conjunctivae, EOM and lids are normal. Pupils are equal, round,  and reactive to light.  Neck: Full passive range of motion without pain.  Cardiovascular:  Pulses:      Radial pulses are 2+ on the right side, and 2+ on the left side.       Dorsalis pedis pulses are 2+ on the right side, and 2+ on the left side.  Pulmonary/Chest: Effort normal.  No evidence of respiratory distress. Able to speak in full sentences without difficulty.  Abdominal: Soft. Normal appearance. There is no tenderness. There is no rigidity and no guarding.  Musculoskeletal: Normal range of motion.  Tenderness palpation to the left calf.  Patient does endorse tenderness to the calf with dorsiflexion of the foot.  There is some slight asymmetry of the bilateral lower extremities, where left lower extremity is slightly larger than the right.  No overlying warmth or erythema noted.  There is some slight erythema noted to the dorsal aspect of the foot.  Neurological: He is alert and oriented to person, place, and time.  Skin: Skin is warm and dry. Capillary refill takes less than 2 seconds.  Good distal cap refill. LLE is not dusky in appearance or cool to touch.  Psychiatric: He has a normal mood and affect. His speech is normal.  Nursing note and vitals reviewed.    ED Treatments / Results  Labs (all labs ordered are listed, but only abnormal results are displayed) Labs Reviewed  I-STAT CHEM 8, ED - Abnormal; Notable for the following components:      Result Value   Creatinine, Ser 1.30 (*)    Glucose, Bld 102 (*)    All other components within normal limits    EKG  EKG Interpretation None       Radiology No results found.  Procedures Procedures (including critical care time)  Medications Ordered in ED Medications  Rivaroxaban (XARELTO) tablet 15 mg (15 mg Oral Given 04/24/17 2006)     Initial Impression / Assessment and Plan / ED Course  I have reviewed the triage vital signs and the nursing notes.  Pertinent labs & imaging results that were available during  my care of the patient  were reviewed by me and considered in my medical decision making (see chart for details).     46 y.o. who presents with 3 days of left calf pain and swelling.  Initially noted some warmth and erythema to the leg but states that has improved.  He recent immobilization, prior history of DVT/PE, recent surgery, leg swelling, or long travel. Patient is afebrile, non-toxic appearing, sitting comfortably on examination table. Vital signs reviewed and stable.  Physical exam shows some slight asymmetry of the left lower extremity.  No overlying warmth or erythema noted.  Patient does have tenderness palpation to the left calf.  Concern for DVT versus cellulitis.  History/physical exam not concerning for acute arterial embolism.  Initial lower extremity ultrasound ordered at triage.  Patient with reassuring vitals.  No complaints of chest pain or shortness of breath.  No tachycardia or hypoxia noted.  Do not suspect PE at this time.  Ultrasound reviewed and shows positive DVT in the left posterior tibial vein.  Will check BMP for evaluation of kidney function.  Discussed results with patient.  Given location, I feel that this may be managed with outpatient treatment.  Patient does not endorse any risk factors for DVT.  Concerned that this is an unprovoked DVT.  He does report that he has been seen by his primary care doctor for evaluation of low-grade fever that is been ongoing for 2 weeks.  He states that that fever has improved.  He also endorses a slight 9 pound weight loss in the last 45 days.  Again this is followed up by his primary care doctor.  Records reviewed show that he recently had lab work.  CBC was within normal limits.  No evidence of anemia or pancytopenia.  No indications that patient needs further workup at this time.  BMP reviewed. Patient has slight bump in creatinine at 1.30 but otherwise unremarkable.  Drug allergies reviewed.  Patient has a good primary care doctor  relationship and is frequently seen by primary care.  Patient vitals are reassuring.  There is no evidence of hypoxia or tachycardia.  Not feel that patient needs further PE workup at this time.  Given good primary care follow-up, location of DVT and reassuring physical exam, I feel that patient's DVT can be treated on outpatient basis.  Instructed patient that he will need to follow-up with his primary care doctor to receive a full course of Xarelto for treatment of unprovoked DVT.  Patient instructed to follow-up with primary care in the next 24-48 hours for further evaluation. Patient had ample opportunity for questions and discussion. All patient's questions were answered with full understanding. Strict return precautions discussed. Patient expresses understanding and agreement to plan.    Final Clinical Impressions(s) / ED Diagnoses   Final diagnoses:  Acute deep vein thrombosis (DVT) of tibial vein of left lower extremity [facility])    ED Discharge Orders        Ordered    Rivaroxaban 15 & 20 MG TBPK     04/24/17 1947       Volanda Napoleon, PA-C 04/24/17 2041    Deno Etienne, DO 04/24/17 2331

## 2017-04-24 NOTE — Discharge Instructions (Signed)
As we discussed, you will need to follow-up with your primary care doctor in the next 24-48 hours for further evaluation.  They will need to continue the medication for the next several months.  As we discussed, do not take any ibuprofen while taking this medication as it makes your risk for bleeding increase.  Return to the emergency department for any worsening pain, worsening redness or swelling, chest pain, difficulty breathing or any other worsening or concerning symptoms.

## 2017-04-24 NOTE — Progress Notes (Signed)
VASCULAR LAB PRELIMINARY  PRELIMINARY  PRELIMINARY  PRELIMINARY  Left lower extremity venous duplex completed.    Preliminary report:  There is acute DVT noted in the left posterior tibial vein.  There is superficial thrombosis noted in the left lesser saphenous vein.  Called report to [place], PA  [facility], [facility], RVT 04/24/2017, 6:22 PM

## 2017-04-24 NOTE — ED Triage Notes (Signed)
Pt is c/o a constant left calf pain and warmness to touch. States there was previous redness but has resolved. Denies hx of DVT.

## 2017-04-24 NOTE — Progress Notes (Signed)
PHARMACY - XARELTO  Patient with new diagnosis of DVT.  Patient received first dose of Xarelto 70m po x 1 dose in the ED.  Pharmacist provided discharge kit and education including indication, instructions for use, possible side effects, missed doses.  Patient verbalized understanding with no additional questions.  LLeone Haven PharmD

## 2017-04-24 NOTE — ED Notes (Signed)
Pt is c/o left side calf pain x 3 days with swelling noted 4/10 sharp shooting pain. No redness is noted.

## 2017-04-25 ENCOUNTER — Encounter: Payer: Self-pay | Admitting: Family Medicine

## 2017-04-26 ENCOUNTER — Ambulatory Visit (INDEPENDENT_AMBULATORY_CARE_PROVIDER_SITE_OTHER): Payer: BLUE CROSS/BLUE SHIELD | Admitting: Family Medicine

## 2017-04-26 ENCOUNTER — Other Ambulatory Visit: Payer: Self-pay

## 2017-04-26 ENCOUNTER — Encounter: Payer: Self-pay | Admitting: *Deleted

## 2017-04-26 ENCOUNTER — Encounter: Payer: Self-pay | Admitting: Family Medicine

## 2017-04-26 VITALS — BP 117/72 | HR 82 | Temp 98.0°F | Resp 16 | Ht 69.0 in | Wt 190.2 lb

## 2017-04-26 DIAGNOSIS — I82442 Acute embolism and thrombosis of left tibial vein: Secondary | ICD-10-CM

## 2017-04-26 DIAGNOSIS — R5081 Fever presenting with conditions classified elsewhere: Secondary | ICD-10-CM | POA: Diagnosis not present

## 2017-04-26 DIAGNOSIS — R51 Headache: Secondary | ICD-10-CM

## 2017-04-26 DIAGNOSIS — R634 Abnormal weight loss: Secondary | ICD-10-CM | POA: Diagnosis not present

## 2017-04-26 DIAGNOSIS — R519 Headache, unspecified: Secondary | ICD-10-CM

## 2017-04-26 LAB — BASIC METABOLIC PANEL
BUN: 15 mg/dL (ref 6–23)
CO2: 25 mEq/L (ref 19–32)
Calcium: 9.7 mg/dL (ref 8.4–10.5)
Chloride: 104 mEq/L (ref 96–112)
Creatinine, Ser: 1.38 mg/dL (ref 0.40–1.50)
GFR: 58.54 mL/min — ABNORMAL LOW (ref >60.00)
Glucose, Bld: 101 mg/dL — ABNORMAL HIGH (ref 70–99)
Potassium: 3.7 mEq/L (ref 3.5–5.1)
Sodium: 139 mEq/L (ref 135–145)

## 2017-04-26 LAB — POCT URINALYSIS DIPSTICK
Bilirubin, UA: NEGATIVE
Blood, UA: NEGATIVE
Glucose, UA: NEGATIVE
Ketones, UA: NEGATIVE
Leukocytes, UA: NEGATIVE
Nitrite, UA: NEGATIVE
Protein, UA: NEGATIVE
Spec Grav, UA: 1.025 (ref 1.010–1.025)
Urobilinogen, UA: 0.2 E.U./dL
pH, UA: 5.5 (ref 5.0–8.0)

## 2017-04-26 LAB — SEDIMENTATION RATE: Sed Rate: 11 mm/hr (ref 0–15)

## 2017-04-26 LAB — TSH: TSH: 1.38 u[IU]/mL (ref 0.35–4.50)

## 2017-04-26 LAB — C-REACTIVE PROTEIN: CRP: 0.4 mg/dL — ABNORMAL LOW (ref 0.5–20.0)

## 2017-04-26 NOTE — Progress Notes (Signed)
OFFICE VISIT  04/26/2017   CC:  Chief Complaint  Patient presents with  . Follow-up    ER visit for DVT    HPI:    Patient is a 46 y.o. Caucasian male who presents for f/u recent dx of DVT in left posterior tibial vein 04/24/17. Unprovoked DVT: no FH or personal hx of thrombosis/embolism, no RF's for DVT.  Started on xarelto, d/c'd home to f/u with me in 1-2 days.  Tolerating xarelto--no sign of bleeding anywhere.  A loose BM last night---he's not sure if this is related to the xarelto or just nervous stomach lately.  His DVT sx's had been occurring for about 1 week prior to his presentation to the ED---pain at rest, but worse with ambulation. Has been throbbing since being seen in ED.  No heat or ice applied.  L calf feels less swollen.  No ankle or foot swelling. The pain comes in crampy waves. He is on no meds for pain.    Saw neuro for HAs: topamax started and maxalt rx'd for prn use.  He has not had a HA in the last 4 days, so this has been going well of late.   Past Medical History:  Diagnosis Date  . Adenomatous colon polyp 10/04/15  . ALLERGIC RHINITIS 07/11/2008  . Allergy    SEASONAL  . 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 Anxiety   . BACK PAIN, CHRONIC, INTERMITTENT 10/17/2008  . Barrett's esophagus determined by biopsy 04/12/14  . BENIGN PROSTATIC HYPERTROPHY, WITH URINARY OBSTRUCTION 07/21/2007  . BURSITIS, HIP 09/30/2009  . CAP (community acquired pneumonia) 06/2014  . Cataracts, bilateral 2016   Not visually significant  . Chronic headaches    Migraine syndrome: worsening fall 2018, neuro eval 04/2017--topamax and maxalt started, MRI brain ordered (LP to be done if MRI neg--pt reporting fevers).  Pt referred for sleep study.  . Chronic renal insufficiency, stage II (mild)    GFR 60s-70s  . Diverticulitis 2016   CT 01/2015  . Diverticulitis   . Frequent headaches   . Gastric polyp 2006  . GERD without esophagitis   . Herpes labialis   . Hiatal hernia   .  Hyperlipidemia   . HYPERTENSION 01/05/2007  . OSA (obstructive sleep apnea) 04/2017   Dr. Rexene Alberts eval 04/2017-plan for sleep study.  Marland Kitchen PSORIASIS 01/05/2007    Past Surgical History:  Procedure Laterality Date  . COLONOSCOPY W/ POLYPECTOMY  10/04/15   91m tubular adenoma + diverticulosis: recall 5 yrs (Dr. SFuller Plan  . ESOPHAGOGASTRODUODENOSCOPY  04/12/14   Barrett's esophagus  . LIPOMA EXCISION     several, arm    Outpatient Medications Prior to Visit  Medication Sig Dispense Refill  . ALPRAZolam (XANAX) 0.25 MG tablet Take 1-2 tabs 30-60 minutes before MRI may repeat if needed. Do not drive. 5 tablet 0  . bisoprolol-hydrochlorothiazide (ZIAC) 5-6.25 MG tablet TAKE 1 TABLET BY MOUTH DAILY. 90 tablet 1  . buPROPion (WELLBUTRIN XL) 300 MG 24 hr tablet Take 1 tablet (300 mg total) by mouth daily. 90 tablet 1  . fluticasone (FLONASE) 50 MCG/ACT nasal spray Place 2 sprays into both nostrils daily. 16 g 6  . hyoscyamine (LEVSIN SL) 0.125 MG SL tablet Place 1 tablet (0.125 mg total) under the tongue every 4 (four) hours as needed for cramping. 120 tablet 11  . ondansetron (ZOFRAN) 4 MG tablet TAKE 1 TABLET BY MOUTH EVERY 8 HOURS AS NEEDED FOR NAUSEA AND VOMITING  0  . RABEprazole (  ACIPHEX) 20 MG tablet Take 1 tablet (20 mg total) by mouth daily. 90 tablet 3  . Rivaroxaban 15 & 20 MG TBPK Take as directed on package: Start with one 16m tablet by mouth twice a day with food. On Day 22, switch to one 224mtablet once a day with food. 51 each 0  . rizatriptan (MAXALT-MLT) 10 MG disintegrating tablet Take 1 tablet (10 mg total) as needed by mouth for migraine. May repeat in 2 hours if needed 10 tablet 0  . rosuvastatin (CRESTOR) 40 MG tablet TAKE 1 TABLET BY MOUTH ONCE A WEEK. 4 tablet 12  . tamsulosin (FLOMAX) 0.4 MG CAPS capsule TAKE 1 CAPSULE BY MOUTH EVERY DAY 90 capsule 1  . tiZANidine (ZANAFLEX) 4 MG tablet Take 1 tablet (4 mg total) every 6 (six) hours as needed by mouth for muscle spasms. Or for  headaches. 60 tablet 6  . Topiramate ER (QUDEXY XR) 100 MG CS24 sprinkle capsule Take 100 mg at bedtime as needed by mouth. 30 each 11  . valACYclovir (VALTREX) 500 MG tablet Take 1 tablet (500 mg total) by mouth daily. (Patient taking differently: Take 500 mg daily as needed by mouth. ) 30 tablet 12  . bifidobacterium infantis (ALIGN) capsule Take 1 capsule by mouth daily.    . butalbital-acetaminophen-caffeine (FIORICET, ESGIC) 50-325-40 MG tablet Take 1-2 tablets by mouth every 6 (six) hours as needed for headache. (Patient not taking: Reported on 04/26/2017) 16 tablet 0  . nystatin (MYCOSTATIN) 100000 UNIT/ML suspension 1 tsp po swish and swallow qid x 14 days (Patient not taking: Reported on 04/26/2017) 280 mL 0  . ondansetron (ZOFRAN) 4 MG tablet Take 1 tablet (4 mg total) by mouth every 8 (eight) hours as needed for nausea or vomiting. (Patient not taking: Reported on 04/26/2017) 20 tablet 0  . ondansetron (ZOFRAN-ODT) 4 MG disintegrating tablet Take 1 tablet (4 mg total) every 8 (eight) hours as needed by mouth for nausea. Or for headache. (Patient not taking: Reported on 04/26/2017) 90 tablet 3  . traMADol (ULTRAM) 50 MG tablet Take 1 tablet (50 mg total) by mouth every 6 (six) hours as needed for moderate pain. (Patient not taking: Reported on 04/26/2017) 90 tablet 1   Facility-Administered Medications Prior to Visit  Medication Dose Route Frequency Provider Last Rate Last Dose  . gadopentetate dimeglumine (MAGNEVIST) injection 18 mL  18 mL Intravenous Once PRN AhMelvenia BeamMD        Allergies  Allergen Reactions  . Compazine [Prochlorperazine Edisylate] Other (See Comments)    Cervical dystonia and panic attack  . Penicillins Other (See Comments)    Childhood allergy Has patient had a PCN reaction causing immediate rash, facial/tongue/throat swelling, SOB or lightheadedness with hypotension: unknown Has patient had a PCN reaction causing severe rash involving mucus membranes or  skin necrosis: Unknown Has patient had a PCN reaction that required hospitalization: Unknown Has patient had a PCN reaction occurring within the last 10 years: Unknown If all of the above answers are "NO", then may proceed with Cephalosporin use.    . Pepto-Bismol [Bismuth Subsalicylate] Swelling    ROS Review of Systems  Constitutional: Positive for fatigue, fever (highest temp 100--but none in last couple weeks) and unexpected weight change (has lost 9 lbs in last 1.5-2 mo). Negative for appetite change and chills.  HENT: Positive for congestion (constant), postnasal drip (slight), sinus pressure and sinus pain. Negative for dental problem, ear pain, rhinorrhea and sore throat.   Eyes: Negative  for discharge, redness and visual disturbance.  Respiratory: Negative for cough, chest tightness, shortness of breath and wheezing.   Cardiovascular: Positive for leg swelling (left lower). Negative for chest pain and palpitations.  Gastrointestinal: Negative for abdominal pain, blood in stool, diarrhea, nausea and vomiting.  Endocrine: Negative for cold intolerance, heat intolerance, polydipsia, polyphagia and polyuria (nocturia; poor flow--flomax not helpful.  ).  Genitourinary: Negative for difficulty urinating, dysuria, flank pain, frequency, hematuria and urgency.  Musculoskeletal: Positive for back pain (lower back pain). Negative for arthralgias, joint swelling, myalgias and neck stiffness.  Skin: Negative for pallor and rash.  Neurological: Positive for headaches (but improved lately). Negative for dizziness, tremors, speech difficulty and weakness.  Hematological: Negative for adenopathy. Does not bruise/bleed easily.  Psychiatric/Behavioral: Negative for confusion and sleep disturbance. The patient is not nervous/anxious.     PE: Blood pressure 117/72, pulse 82, temperature 98 F (36.7 C), temperature source Oral, resp. rate 16, height 5' 9"  (1.753 m), weight 190 lb 4 oz (86.3 kg), SpO2  99 %. Gen: Alert, well appearing.  Patient is oriented to person, place, time, and situation. AFFECT: pleasant, lucid thought and speech. ENT: Ears: EACs clear, normal epithelium.  TMs with good light reflex and landmarks bilaterally.  Eyes: no injection, icteris, swelling, or exudate.  EOMI, PERRLA. Nose: no drainage or turbinate edema/swelling.  No injection or focal lesion.  Mouth: lips without lesion/swelling.  Oral mucosa pink and moist.  Tongue with white film adherent to surface.  Dentition intact and without obvious caries or gingival swelling.  Oropharynx without erythema, exudate, or swelling.  Neck - No masses or thyromegaly or limitation in range of motion. CV: RRR, no m/r/g.   LUNGS: CTA bilat, nonlabored resps, good aeration in all lung fields. ABD: soft, NT, ND, BS normal.  No hepatospenomegaly or mass.  No bruits. EXT: no clubbing, cyanosis, or edema.  Skin - no sores or suspicious lesions or rashes or color changes Musculoskeletal: no joint swelling, erythema, warmth, or tenderness.  ROM of all joints intact.   IMAGING:  VAS [place] lower ext venous left: Final Interpretation Right: No evidence of common femoral vein obstruction. Left: There is evidence of acute superficial thrombosis of the small saphenous vein. There is acute DVT noted in the left posterior tibial vein.  LABS:  Lab Results  Component Value Date   TSH 1.38 04/26/2017   Lab Results  Component Value Date   WBC 8.2 04/06/2017   HGB 13.3 04/24/2017   HCT 39.0 04/24/2017   MCV 89.5 04/06/2017   PLT 293.0 04/06/2017   Lab Results  Component Value Date   CREATININE 1.38 04/26/2017   BUN 15 04/26/2017   NA 139 04/26/2017   K 3.7 04/26/2017   CL 104 04/26/2017   CO2 25 04/26/2017   Lab Results  Component Value Date   ALT 40 04/06/2017   AST 26 04/06/2017   ALKPHOS 72 04/06/2017   BILITOT 0.6 04/06/2017   Lab Results  Component Value Date   CHOL 195 10/23/2016   Lab Results  Component Value  Date   HDL 37.20 (L) 10/23/2016   Lab Results  Component Value Date   LDLCALC 117 (H) 12/12/2013   Lab Results  Component Value Date   TRIG 282.0 (H) 10/23/2016   Lab Results  Component Value Date   CHOLHDL 5 10/23/2016   Lab Results  Component Value Date   PSA 0.46 06/18/2014   Lab Results  Component Value Date   ESRSEDRATE 11  04/26/2017   Lab Results  Component Value Date   CRP 0.4 (L) 04/26/2017    IMPRESSION AND PLAN:  1) Acute L posterior tibial DVT, idiopathic. Tolerating xarelto 21m bid.  Change to 22mqd after 3 weeks. Will refer to oncology for help with consideration of lab w/u for hereditary thrombotic d/o. Also, will do general lab eval today for conditions that may predispose to DVT.  However, will hold off on FULL occult malignancy search at this time.   Check TSH, BMET, CRP, ESR, Rh factor, CCP ab, RPR, HIV, ANA, UA, and CXR. Recommended he continue tylenol 1000 mg q6h prn for pain, also apply ice to L calf region of pain.  I suspect his pain is coming from his superficial venous thrombosis of small saphenous vein.  2) Headaches, chronic daily.  Migraine syndrome + rebound HA's.   He has responded well to starting topamax extended release 10042md and maxalt prn. He is to avoid use of any other otc or rx pain med for HA as much as he possibly can to help avoid rebound HAs.  An After Visit Summary was printed and given to the patient.  FOLLOW UP: Return in about 4 weeks (around 05/24/2017) for f/u DVT.  Signed:  PhiCrissie SicklesD           04/26/2017

## 2017-04-27 ENCOUNTER — Encounter: Payer: Self-pay | Admitting: Family Medicine

## 2017-04-27 ENCOUNTER — Encounter: Payer: Self-pay | Admitting: *Deleted

## 2017-04-27 ENCOUNTER — Other Ambulatory Visit (INDEPENDENT_AMBULATORY_CARE_PROVIDER_SITE_OTHER): Payer: BLUE CROSS/BLUE SHIELD

## 2017-04-27 DIAGNOSIS — I82442 Acute embolism and thrombosis of left tibial vein: Secondary | ICD-10-CM | POA: Insufficient documentation

## 2017-04-27 LAB — HIV ANTIBODY (ROUTINE TESTING W REFLEX): HIV 1&2 Ab, 4th Generation: NONREACTIVE

## 2017-04-27 LAB — RHEUMATOID FACTOR: Rhuematoid fact SerPl-aCnc: <14 IU/mL (ref <14)

## 2017-04-27 LAB — PSA: PSA: 0.28 ng/mL (ref 0.10–4.00)

## 2017-04-27 LAB — RPR: RPR Ser Ql: NONREACTIVE

## 2017-04-27 LAB — CYCLIC CITRUL PEPTIDE ANTIBODY, IGG: Cyclic Citrullin Peptide Ab: <16 UNITS

## 2017-04-28 LAB — ANTINUCLEAR ANTIBODIES, IFA: ANA Titer 1: NEGATIVE

## 2017-04-29 ENCOUNTER — Encounter: Payer: Self-pay | Admitting: *Deleted

## 2017-05-13 ENCOUNTER — Ambulatory Visit (HOSPITAL_BASED_OUTPATIENT_CLINIC_OR_DEPARTMENT_OTHER)
Admission: RE | Admit: 2017-05-13 | Discharge: 2017-05-13 | Disposition: A | Discharge status: Home / Self Care | Payer: BLUE CROSS/BLUE SHIELD | Source: Ambulatory Visit | Attending: Family Medicine | Admitting: Family Medicine

## 2017-05-13 DIAGNOSIS — I82409 Acute embolism and thrombosis of unspecified deep veins of unspecified lower extremity: Secondary | ICD-10-CM | POA: Diagnosis not present

## 2017-05-13 DIAGNOSIS — I82442 Acute embolism and thrombosis of left tibial vein: Secondary | ICD-10-CM | POA: Insufficient documentation

## 2017-05-14 ENCOUNTER — Telehealth: Payer: Self-pay | Admitting: Neurology

## 2017-05-14 ENCOUNTER — Encounter: Payer: Self-pay | Admitting: *Deleted

## 2017-05-14 NOTE — Telephone Encounter (Signed)
We have attempted to call the patient 2 times to schedule sleep study. Patient has been unavailable at the phone numbers we have on file and has not returned our calls. At this point we will send a letter asking pt to please contact the sleep lab to schedule their sleep study. If patient calls back we will schedule them for their sleep study. ° °

## 2017-05-17 ENCOUNTER — Ambulatory Visit: Payer: BLUE CROSS/BLUE SHIELD | Admitting: Family Medicine

## 2017-05-18 ENCOUNTER — Ambulatory Visit (INDEPENDENT_AMBULATORY_CARE_PROVIDER_SITE_OTHER): Payer: BLUE CROSS/BLUE SHIELD | Admitting: Family Medicine

## 2017-05-18 ENCOUNTER — Other Ambulatory Visit: Payer: Self-pay

## 2017-05-18 ENCOUNTER — Encounter: Payer: Self-pay | Admitting: Family Medicine

## 2017-05-18 VITALS — BP 115/74 | HR 83 | Temp 98.0°F | Resp 16 | Ht 69.0 in | Wt 191.2 lb

## 2017-05-18 DIAGNOSIS — M79661 Pain in right lower leg: Secondary | ICD-10-CM

## 2017-05-18 DIAGNOSIS — I82442 Acute embolism and thrombosis of left tibial vein: Secondary | ICD-10-CM

## 2017-05-18 MED ORDER — RIVAROXABAN 20 MG PO TABS: 20.0000 mg | ORAL_TABLET | Freq: Every day | ORAL | 2 refills | Status: DC

## 2017-05-18 NOTE — Progress Notes (Signed)
OFFICE VISIT  05/18/2017   CC:  Chief Complaint  Patient presents with  . Follow-up    DVT   HPI:    Patient is a 46 y.o. Caucasian male who presents for 3 wk f/u acute DVT--idiopathic. 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 He was also having some signif pain from a superficial DVT in same leg. Tylenol and ice application was recommended. Says left leg w/out any pain now.  No left leg swelling. Says in the last day he has had a slight intermittent pulling sensation in R calf, no swelling--focal area about 2/3 of the way down calf. No heel pain or achilles pain.  No trauma or strain recalled.  Has not missed any doses of xarelto--he is now on 20mg  qd. Has hematology appt 05/24/17.  He had been having mild/low grade fever for approx several weeks last time I saw him: rheum labs, PSA, UA, and CXR all normal at that time.  No further low grade or subjective fevers.  Past Medical History:  Diagnosis Date  . Acute DVT of left tibial vein ([place]) 04/24/2017   Xarelto started (dx'd in emergency dept).  . Adenomatous colon polyp 10/04/15  . ALLERGIC RHINITIS 07/11/2008  . Allergy    SEASONAL  . 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 Anxiety   . BACK PAIN, CHRONIC, INTERMITTENT 10/17/2008  . Barrett's esophagus determined by biopsy 04/12/14  . BENIGN PROSTATIC HYPERTROPHY, WITH URINARY OBSTRUCTION 07/21/2007  . BURSITIS, HIP 09/30/2009  . CAP (community acquired pneumonia) 06/2014  . Cataracts, bilateral 2016   Not visually significant  . Chronic headaches    Migraine syndrome: worsening fall 2018, neuro eval 04/2017--topamax and maxalt started, MRI brain ordered (LP to be done if MRI neg--pt reporting fevers).  Pt referred for sleep study.  . Chronic renal insufficiency, stage II (mild)    GFR 60s-70s (suspect HTN and NSAID damage as etiology).  GFR dipped into 50s fall 2018 after lots of NSAID use for chronic daily HA's.  .  Diverticulitis 2016   CT 01/2015  . Frequent headaches   . Gastric polyp 2006  . GERD without esophagitis   . Herpes labialis   . Hiatal hernia   . Hyperlipidemia   . HYPERTENSION 01/05/2007  . OSA (obstructive sleep apnea) 04/2017   Dr. Rexene Alberts eval 04/2017-plan for sleep study.  Marland Kitchen PSORIASIS 01/05/2007    Past Surgical History:  Procedure Laterality Date  . COLONOSCOPY W/ POLYPECTOMY  10/04/15   78mm tubular adenoma + diverticulosis: recall 5 yrs (Dr. Fuller Plan)  . ESOPHAGOGASTRODUODENOSCOPY  04/12/14   Barrett's esophagus  . LIPOMA EXCISION     several, arm    Outpatient Medications Prior to Visit  Medication Sig Dispense Refill  . ALPRAZolam (XANAX) 0.25 MG tablet Take 1-2 tabs 30-60 minutes before MRI may repeat if needed. Do not drive. 5 tablet 0  . bisoprolol-hydrochlorothiazide (ZIAC) 5-6.25 MG tablet TAKE 1 TABLET BY MOUTH DAILY. 90 tablet 1  . buPROPion (WELLBUTRIN XL) 300 MG 24 hr tablet Take 1 tablet (300 mg total) by mouth daily. 90 tablet 1  . fluticasone (FLONASE) 50 MCG/ACT nasal spray Place 2 sprays into both nostrils daily. 16 g 6  . hyoscyamine (LEVSIN SL) 0.125 MG SL tablet Place 1 tablet (0.125 mg total) under the tongue every 4 (four) hours as needed for cramping. 120 tablet 11  . ondansetron (ZOFRAN) 4 MG tablet TAKE 1 TABLET BY MOUTH  EVERY 8 HOURS AS NEEDED FOR NAUSEA AND VOMITING  0  . RABEprazole (ACIPHEX) 20 MG tablet Take 1 tablet (20 mg total) by mouth daily. 90 tablet 3  . rizatriptan (MAXALT-MLT) 10 MG disintegrating tablet Take 1 tablet (10 mg total) as needed by mouth for migraine. May repeat in 2 hours if needed 10 tablet 0  . rosuvastatin (CRESTOR) 40 MG tablet TAKE 1 TABLET BY MOUTH ONCE A WEEK. 4 tablet 12  . tamsulosin (FLOMAX) 0.4 MG CAPS capsule TAKE 1 CAPSULE BY MOUTH EVERY DAY 90 capsule 1  . tiZANidine (ZANAFLEX) 4 MG tablet Take 1 tablet (4 mg total) every 6 (six) hours as needed by mouth for muscle spasms. Or for headaches. 60 tablet 6  . Topiramate  ER (QUDEXY XR) 100 MG CS24 sprinkle capsule Take 100 mg at bedtime as needed by mouth. 30 each 11  . valACYclovir (VALTREX) 500 MG tablet Take 1 tablet (500 mg total) by mouth daily. (Patient taking differently: Take 500 mg daily as needed by mouth. ) 30 tablet 12  . Rivaroxaban 15 & 20 MG TBPK Take as directed on package: Start with one 15mg  tablet by mouth twice a day with food. On Day 22, switch to one 20mg  tablet once a day with food. 51 each 0   Facility-Administered Medications Prior to Visit  Medication Dose Route Frequency Provider Last Rate Last Dose  . gadopentetate dimeglumine (MAGNEVIST) injection 18 mL  18 mL Intravenous Once PRN Melvenia Beam, MD        Allergies  Allergen Reactions  . Compazine [Prochlorperazine Edisylate] Other (See Comments)    Cervical dystonia and panic attack  . Penicillins Other (See Comments)    Childhood allergy Has patient had a PCN reaction causing immediate rash, facial/tongue/throat swelling, SOB or lightheadedness with hypotension: unknown Has patient had a PCN reaction causing severe rash involving mucus membranes or skin necrosis: Unknown Has patient had a PCN reaction that required hospitalization: Unknown Has patient had a PCN reaction occurring within the last 10 years: Unknown If all of the above answers are "NO", then may proceed with Cephalosporin use.    . Pepto-Bismol [Bismuth Subsalicylate] Swelling    ROS As per HPI  PE: Blood pressure 115/74, pulse 83, temperature 98 F (36.7 C), temperature source Oral, resp. rate 16, height 5\' 9"  (1.753 m), weight 191 lb 4 oz (86.8 kg), SpO2 97 %. Gen: Alert, well appearing.  Patient is oriented to person, place, time, and situation. AFFECT: pleasant, lucid thought and speech. Lower legs: neither the left or right calf have any swelling, tenderness, warmth, or erythema. Homan's neg bilat.  LABS:  Lab Results  Component Value Date   TSH 1.38 04/26/2017   Lab Results  Component  Value Date   WBC 8.2 04/06/2017   HGB 13.3 04/24/2017   HCT 39.0 04/24/2017   MCV 89.5 04/06/2017   PLT 293.0 04/06/2017   Lab Results  Component Value Date   CREATININE 1.38 04/26/2017   BUN 15 04/26/2017   NA 139 04/26/2017   K 3.7 04/26/2017   CL 104 04/26/2017   CO2 25 04/26/2017   Lab Results  Component Value Date   ALT 40 04/06/2017   AST 26 04/06/2017   ALKPHOS 72 04/06/2017   BILITOT 0.6 04/06/2017   Lab Results  Component Value Date   CHOL 195 10/23/2016   Lab Results  Component Value Date   HDL 37.20 (L) 10/23/2016   Lab Results  Component Value Date  LDLCALC 117 (H) 12/12/2013   Lab Results  Component Value Date   TRIG 282.0 (H) 10/23/2016   Lab Results  Component Value Date   CHOLHDL 5 10/23/2016   Lab Results  Component Value Date   PSA 0.28 04/27/2017   PSA 0.46 06/18/2014    IMPRESSION AND PLAN:  Left calf DVT: doing well and is w/out any pain. Compliant with xarelto 20mg  qd. Plan at least 3 mo duration of anticoagulation; pending hematologist evaluation. Recent mild/intermittent R calf pain x < 24h.  Watchful waiting approach at this time. If it gets more persistent or severe or if he notes LE swelling, will proceed with LE venous doppler.  An After Visit Summary was printed and given to the patient.  FOLLOW UP: Return in about 3 months (around 08/16/2017) for routine chronic illness f/u.  Signed:  Crissie Sickles, MD           05/18/2017

## 2017-05-21 ENCOUNTER — Other Ambulatory Visit: Payer: Self-pay | Admitting: Family

## 2017-05-21 DIAGNOSIS — D6859 Other primary thrombophilia: Secondary | ICD-10-CM

## 2017-05-21 DIAGNOSIS — I82442 Acute embolism and thrombosis of left tibial vein: Secondary | ICD-10-CM

## 2017-05-24 ENCOUNTER — Other Ambulatory Visit: Payer: Self-pay

## 2017-05-24 ENCOUNTER — Ambulatory Visit (HOSPITAL_BASED_OUTPATIENT_CLINIC_OR_DEPARTMENT_OTHER): Payer: BLUE CROSS/BLUE SHIELD | Admitting: Family

## 2017-05-24 ENCOUNTER — Other Ambulatory Visit (HOSPITAL_BASED_OUTPATIENT_CLINIC_OR_DEPARTMENT_OTHER): Payer: BLUE CROSS/BLUE SHIELD

## 2017-05-24 ENCOUNTER — Encounter: Payer: Self-pay | Admitting: Family

## 2017-05-24 VITALS — BP 121/87 | HR 78 | Temp 97.9°F | Resp 18 | Wt 189.0 lb

## 2017-05-24 DIAGNOSIS — I82442 Acute embolism and thrombosis of left tibial vein: Secondary | ICD-10-CM | POA: Diagnosis not present

## 2017-05-24 DIAGNOSIS — I82422 Acute embolism and thrombosis of left iliac vein: Secondary | ICD-10-CM | POA: Diagnosis not present

## 2017-05-24 DIAGNOSIS — D6859 Other primary thrombophilia: Secondary | ICD-10-CM | POA: Diagnosis not present

## 2017-05-24 LAB — CMP (CANCER CENTER ONLY)
ALT(SGPT): 35 U/L (ref 10–47)
AST: 30 U/L (ref 11–38)
Albumin: 4.1 g/dL (ref 3.3–5.5)
Alkaline Phosphatase: 66 U/L (ref 26–84)
BUN, Bld: 17 mg/dL (ref 7–22)
CO2: 26 mEq/L (ref 18–33)
Calcium: 9 mg/dL (ref 8.0–10.3)
Chloride: 106 mEq/L (ref 98–108)
Creat: 1.4 mg/dl — ABNORMAL HIGH (ref 0.6–1.2)
Glucose, Bld: 97 mg/dL (ref 73–118)
Potassium: 3.6 mEq/L (ref 3.3–4.7)
Sodium: 141 mEq/L (ref 128–145)
Total Bilirubin: 1 mg/dl (ref 0.20–1.60)
Total Protein: 7.5 g/dL (ref 6.4–8.1)

## 2017-05-24 LAB — CBC WITH DIFFERENTIAL (CANCER CENTER ONLY)
BASO#: 0.1 10*3/uL (ref 0.0–0.2)
BASO%: 0.9 % (ref 0.0–2.0)
EOS%: 2.6 % (ref 0.0–7.0)
Eosinophils Absolute: 0.2 10*3/uL (ref 0.0–0.5)
HCT: 42.2 % (ref 38.7–49.9)
HGB: 14.9 g/dL (ref 13.0–17.1)
LYMPH#: 2.9 10*3/uL (ref 0.9–3.3)
LYMPH%: 50.2 % — ABNORMAL HIGH (ref 14.0–48.0)
MCH: 30.7 pg (ref 28.0–33.4)
MCHC: 35.3 g/dL (ref 32.0–35.9)
MCV: 87 fL (ref 82–98)
MONO#: 0.5 10*3/uL (ref 0.1–0.9)
MONO%: 9.3 % (ref 0.0–13.0)
NEUT#: 2.1 10*3/uL (ref 1.5–6.5)
NEUT%: 37 % — ABNORMAL LOW (ref 40.0–80.0)
Platelets: 266 10*3/uL (ref 145–400)
RBC: 4.86 10*6/uL (ref 4.20–5.70)
RDW: 13 % (ref 11.1–15.7)
WBC: 5.7 10*3/uL (ref 4.0–10.0)

## 2017-05-24 LAB — CHCC SATELLITE - SMEAR

## 2017-05-24 LAB — IRON AND TIBC
%SAT: 18 % — ABNORMAL LOW (ref 20–55)
Iron: 65 ug/dL (ref 42–163)
TIBC: 361 ug/dL (ref 202–409)
UIBC: 296 ug/dL (ref 117–376)

## 2017-05-24 LAB — LACTATE DEHYDROGENASE: LDH: 174 U/L (ref 125–245)

## 2017-05-24 LAB — FERRITIN: Ferritin: 111 ng/ml (ref 22–316)

## 2017-05-24 NOTE — Progress Notes (Signed)
Hematology/Oncology Consultation   Name: Benjamin [name]      MRN: [id number]    Location: Room/bed info not found  Date: 05/24/2017 Time:10:44 AM   REFERRING PHYSICIAN: Cherlynn June, MD  REASON FOR CONSULT: Acute DVT of the left tibial vein   DIAGNOSIS:  Acute DVT of the left posterior tibial vein Acute superficial thombosis of the small saphenous vein   HISTORY OF PRESENT ILLNESS: Benjamin [name] is a very pleasant 46 yo caucasian gentleman with recent diagnosis of an acute DVT of the left posterior tibial vein and an acute superficial thombosis of the small saphenous vein. He has no prior history of thrombus. No family history. In early November he developed left calf pain and he thought he had pulled a muscle. [place] confirmed thrombus.  He is not a smoker and does not use any testosterone therapy.  He does travel in the car quite a bit for work. He works in Training and development officer.  He also noticed around that same time in November he was running a low grade fever (99's) for 2-3 weeks.  He had some mild nausea (no vomiting) as well. His appetite has improved but he admits that he needs to hydrate better.  He states that from August to September he lost 11 lbs.  He is feeling quite fatigued and has to take an afternoon nap each day.  He is doing well on Xarelto and has had no issue with bleeding, bruising or petechiae.  He has occasional foot cramps.  He has occasional abdominal pain and history of diverticulitis.  No personal cancer history. Family history of cancer includes paternal grandfather with liver (was an alcoholic) and maternal grandmother with lung cancer (was a smoker).  No chills, cough, rash, dizziness, SOB, chest pain, palpitations or changes in bowel or bladder habits. No swelling, tenderness, numbness or tingling in his extremities. No c/o pain.   ROS: All other 10 point review of systems is negative.   PAST MEDICAL HISTORY:   Past Medical History:  Diagnosis Date  .  Acute DVT of left tibial vein ([place]) 04/24/2017   Xarelto started (dx'd in emergency dept).  . Adenomatous colon polyp 10/04/15  . ALLERGIC RHINITIS 07/11/2008  . Allergy    SEASONAL  . 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 Anxiety   . BACK PAIN, CHRONIC, INTERMITTENT 10/17/2008  . Barrett's esophagus determined by biopsy 04/12/14  . BENIGN PROSTATIC HYPERTROPHY, WITH URINARY OBSTRUCTION 07/21/2007  . BURSITIS, HIP 09/30/2009  . CAP (community acquired pneumonia) 06/2014  . Cataracts, bilateral 2016   Not visually significant  . Chronic headaches    Migraine syndrome: worsening fall 2018, neuro eval 04/2017--topamax and maxalt started, MRI brain ordered (LP to be done if MRI neg--pt reporting fevers).  Pt referred for sleep study.  . Chronic renal insufficiency, stage II (mild)    GFR 60s-70s (suspect HTN and NSAID damage as etiology).  GFR dipped into 50s fall 2018 after lots of NSAID use for chronic daily HA's.  . Diverticulitis 2016   CT 01/2015  . Frequent headaches   . Gastric polyp 2006  . GERD without esophagitis   . Herpes labialis   . Hiatal hernia   . Hyperlipidemia   . HYPERTENSION 01/05/2007  . OSA (obstructive sleep apnea) 04/2017   Dr. Rexene Alberts eval 04/2017-plan for sleep study.  Marland Kitchen PSORIASIS 01/05/2007    ALLERGIES: Allergies  Allergen Reactions  . Compazine [Prochlorperazine Edisylate] Other (See Comments)    Cervical dystonia  and panic attack  . Penicillins Other (See Comments)    Childhood allergy Has patient had a PCN reaction causing immediate rash, facial/tongue/throat swelling, SOB or lightheadedness with hypotension: unknown Has patient had a PCN reaction causing severe rash involving mucus membranes or skin necrosis: Unknown Has patient had a PCN reaction that required hospitalization: Unknown Has patient had a PCN reaction occurring within the last 10 years: Unknown If all of the above answers are "NO", then may proceed with Cephalosporin use.    .  Pepto-Bismol [Bismuth Subsalicylate] Swelling      MEDICATIONS:  Current Outpatient Medications on File Prior to Visit  Medication Sig Dispense Refill  . ALPRAZolam (XANAX) 0.25 MG tablet Take 1-2 tabs 30-60 minutes before MRI may repeat if needed. Do not drive. 5 tablet 0  . bisoprolol-hydrochlorothiazide (ZIAC) 5-6.25 MG tablet TAKE 1 TABLET BY MOUTH DAILY. 90 tablet 1  . buPROPion (WELLBUTRIN XL) 300 MG 24 hr tablet Take 1 tablet (300 mg total) by mouth daily. 90 tablet 1  . fluticasone (FLONASE) 50 MCG/ACT nasal spray Place 2 sprays into both nostrils daily. 16 g 6  . hyoscyamine (LEVSIN SL) 0.125 MG SL tablet Place 1 tablet (0.125 mg total) under the tongue every 4 (four) hours as needed for cramping. 120 tablet 11  . ondansetron (ZOFRAN) 4 MG tablet TAKE 1 TABLET BY MOUTH EVERY 8 HOURS AS NEEDED FOR NAUSEA AND VOMITING  0  . RABEprazole (ACIPHEX) 20 MG tablet Take 1 tablet (20 mg total) by mouth daily. 90 tablet 3  . rivaroxaban (XARELTO) 20 MG TABS tablet Take 1 tablet (20 mg total) by mouth daily with supper. 30 tablet 2  . rizatriptan (MAXALT-MLT) 10 MG disintegrating tablet Take 1 tablet (10 mg total) as needed by mouth for migraine. May repeat in 2 hours if needed 10 tablet 0  . rosuvastatin (CRESTOR) 40 MG tablet TAKE 1 TABLET BY MOUTH ONCE A WEEK. 4 tablet 12  . tamsulosin (FLOMAX) 0.4 MG CAPS capsule TAKE 1 CAPSULE BY MOUTH EVERY DAY 90 capsule 1  . tiZANidine (ZANAFLEX) 4 MG tablet Take 1 tablet (4 mg total) every 6 (six) hours as needed by mouth for muscle spasms. Or for headaches. 60 tablet 6  . Topiramate ER (QUDEXY XR) 100 MG CS24 sprinkle capsule Take 100 mg at bedtime as needed by mouth. 30 each 11  . valACYclovir (VALTREX) 500 MG tablet Take 1 tablet (500 mg total) by mouth daily. (Patient taking differently: Take 500 mg daily as needed by mouth. ) 30 tablet 12  . [DISCONTINUED] lisinopril-hydrochlorothiazide (PRINZIDE,ZESTORETIC) 20-25 MG per tablet Take 1 tablet by mouth  daily. 90 tablet 3   Current Facility-Administered Medications on File Prior to Visit  Medication Dose Route Frequency Provider Last Rate Last Dose  . gadopentetate dimeglumine (MAGNEVIST) injection 18 mL  18 mL Intravenous Once PRN Melvenia Beam, MD         PAST SURGICAL HISTORY Past Surgical History:  Procedure Laterality Date  . COLONOSCOPY W/ POLYPECTOMY  10/04/15   72mm tubular adenoma + diverticulosis: recall 5 yrs (Dr. Fuller Plan)  . ESOPHAGOGASTRODUODENOSCOPY  04/12/14   Barrett's esophagus  . LIPOMA EXCISION     several, arm    FAMILY HISTORY: Family History  Problem Relation Age of Onset  . CAD Father        around age 35  . Heart disease Father   . Hyperlipidemia Father   . Hypertension Father   . CAD Paternal Grandmother   .  Diabetes Paternal Grandmother   . Hypertension Mother   . Hypertension Brother   . Hyperlipidemia Brother   . Lung cancer Maternal Grandmother   . Cancer Maternal Grandfather        type unknown  . Colon cancer Unknown        neg hx  . Prostate cancer Unknown        neg hx    SOCIAL HISTORY:  reports that  has never smoked. he has never used smokeless tobacco. He reports that he drinks about 1.2 oz of alcohol per week. He reports that he does not use drugs.  PERFORMANCE STATUS: The patient's performance status is 1 - Symptomatic but completely ambulatory  PHYSICAL EXAM: Most Recent Vital Signs: Blood pressure 121/87, pulse 78, temperature 97.9 F (36.6 C), temperature source Oral, resp. rate 18, weight 189 lb (85.7 kg), SpO2 99 %. BP 121/87 (BP Location: Left Arm, Patient Position: Sitting)   Pulse 78   Temp 97.9 F (36.6 C) (Oral)   Resp 18   Wt 189 lb (85.7 kg)   SpO2 99%   BMI 27.91 kg/m   General Appearance:    Alert, cooperative, no distress, appears stated age  Head:    Normocephalic, without obvious abnormality, atraumatic  Eyes:    PERRL, conjunctiva/corneas clear, EOM's intact, fundi    benign, both eyes              Throat:   Lips, mucosa, and tongue normal; teeth and gums normal  Neck:   Supple, symmetrical, trachea midline, no adenopathy;       thyroid:  No enlargement/tenderness/nodules; no carotid   bruit or JVD  Back:     Symmetric, no curvature, ROM normal, no CVA tenderness  Lungs:     Clear to auscultation bilaterally, respirations unlabored  Chest wall:    No tenderness or deformity  Heart:    Regular rate and rhythm, S1 and S2 normal, no murmur, rub   or gallop  Abdomen:     Soft, non-tender, bowel sounds active all four quadrants,    no masses, no organomegaly        Extremities:   Extremities normal, atraumatic, no cyanosis or edema  Pulses:   2+ and symmetric all extremities  Skin:   Skin color, texture, turgor normal, no rashes or lesions  Lymph nodes:   Cervical, supraclavicular, and axillary nodes normal  Neurologic:   CNII-XII intact. Normal strength, sensation and reflexes      throughout    LABORATORY DATA:  Results for orders placed or performed in visit on 05/24/17 (from the past 48 hour(s))  CBC w/Diff     Status: Abnormal   Collection Time: 05/24/17  8:45 AM  Result Value Ref Range   WBC 5.7 4.0 - 10.0 10e3/uL   RBC 4.86 4.20 - 5.70 10e6/uL   HGB 14.9 13.0 - 17.1 g/dL   HCT 42.2 38.7 - 49.9 %   MCV 87 82 - 98 fL   MCH 30.7 28.0 - 33.4 pg   MCHC 35.3 32.0 - 35.9 g/dL   RDW 13.0 11.1 - 15.7 %   Platelets 266 145 - 400 10e3/uL   NEUT# 2.1 1.5 - 6.5 10e3/uL   LYMPH# 2.9 0.9 - 3.3 10e3/uL   MONO# 0.5 0.1 - 0.9 10e3/uL   Eosinophils Absolute 0.2 0.0 - 0.5 10e3/uL   BASO# 0.1 0.0 - 0.2 10e3/uL   NEUT% 37.0 (L) 40.0 - 80.0 %   LYMPH% 50.2 (H) 14.0 -  48.0 %   MONO% 9.3 0.0 - 13.0 %   EOS% 2.6 0.0 - 7.0 %   BASO% 0.9 0.0 - 2.0 %  CMP STAT     Status: Abnormal   Collection Time: 05/24/17  8:45 AM  Result Value Ref Range   Sodium 141 128 - 145 mEq/L   Potassium 3.6 3.3 - 4.7 mEq/L   Chloride 106 98 - 108 mEq/L   CO2 26 18 - 33 mEq/L   Glucose, Bld 97 73 - 118 mg/dL    BUN, Bld 17 7 - 22 mg/dL   Creat 1.4 (H) 0.6 - 1.2 mg/dl   Total Bilirubin 1.00 0.20 - 1.60 mg/dl   Alkaline Phosphatase 66 26 - 84 U/L   AST 30 11 - 38 U/L   ALT(SGPT) 35 10 - 47 U/L   Total Protein 7.5 6.4 - 8.1 g/dL   Albumin 4.1 3.3 - 5.5 g/dL   Calcium 9.0 8.0 - 10.3 mg/dL  Smear     Status: None   Collection Time: 05/24/17  8:45 AM  Result Value Ref Range   Smear Result Smear Available       RADIOGRAPHY: No results found.     PATHOLOGY: None   ASSESSMENT/PLAN: Mr. Dulude is a very pleasant 46 yo caucasian gentleman with recent diagnosis of an acute DVT of the left posterior tibial vein and an acute superficial thombosis of the small saphenous vein. He has no prior history of thrombus. He is doing well on Xarelto and his leg is much better. He will continue his same regimen.  We will see what his hypercoag panel shows.  We will plan to see him back again in another 6 weeks for follow-up and lab and repeat an [place] at that time.   All questions were answered and he is in agreement with the plan. He will contact our office with any questions or concerns. We can certainly see him much sooner if necessary.  He was discussed with and also seen by Dr. Marin Olp and he is in agreement with the aforementioned.   Laverna Peace    ADDENDUM: I saw and examined Mr. Mercer back with Judson Roch.  I agree with the above assessment.  I would wonder if he does have a hypercoagulable state.  Of course, nowadays almost everybody has a positive lupus anticoagulant.  We will have to see if his is positive.  As far as anticoagulation duration, I would keep him on full dose anticoagulation for 6 months.  I would then put him on maintenance Xarelto for 1 year.  I would like to follow-up with a Doppler of his leg in 6 weeks when we see him back.  That would be 3 months since we last saw him.  His leg looks okay.  I think the Xarelto is doing quite well for him.  We spent about 40 minutes with  him today.  We answered all of his questions.  We reassured him that he should do quite well.  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  If he does have a chronic thrombus in the left leg, this really should have a low tendency to recur, particularly after maintenance Xarelto.  Lattie Haw, MD

## 2017-05-26 ENCOUNTER — Encounter: Payer: Self-pay | Admitting: Family Medicine

## 2017-05-27 LAB — CARDIOLIPIN ANTIBODIES, IGG, IGM, IGA
Anticardiolipin Ab,IgA,Qn: <9 APL U/mL (ref 0–11)
Anticardiolipin Ab,IgG,Qn: <9 GPL U/mL (ref 0–14)
Anticardiolipin Ab,IgM,Qn: 14 MPL U/mL — ABNORMAL HIGH (ref 0–12)

## 2017-05-27 LAB — PROTEIN S ACTIVITY: Protein S-Functional: 87 % (ref 63–140)

## 2017-05-27 LAB — BETA-2-GLYCOPROTEIN I ABS, IGG/M/A
Beta-2 Glyco 1 IgA: <9 GPI IgA units (ref 0–25)
Beta-2 Glyco 1 IgM: <9 GPI IgM units (ref 0–32)
Beta-2 Glycoprotein I Ab, IgG: 11 GPI IgG units (ref 0–20)

## 2017-05-27 LAB — PROTEIN C ACTIVITY: Protein C-Functional: 115 % (ref 73–180)

## 2017-05-27 LAB — LUPUS ANTICOAGULANT PANEL
PTT-LA: 46.6 s (ref 0.0–51.9)
dRVVT Confirm: 2.2 ratio — ABNORMAL HIGH (ref 0.8–1.2)
dRVVT Mix: 85.9 s — ABNORMAL HIGH (ref 0.0–47.0)
dRVVT: 116.5 s — ABNORMAL HIGH (ref 0.0–47.0)

## 2017-05-27 LAB — PROTEIN C, TOTAL: Protein C Antigen: 113 % (ref 60–150)

## 2017-05-27 LAB — PROTEIN S, TOTAL: Protein S, Total: 81 % (ref 60–150)

## 2017-05-27 LAB — ANTITHROMBIN III: Antithrombin Activity: 109 % (ref 75–135)

## 2017-05-28 LAB — FACTOR 5 LEIDEN

## 2017-05-28 LAB — PROTHROMBIN GENE MUTATION

## 2017-05-31 ENCOUNTER — Telehealth: Payer: Self-pay | Admitting: Family

## 2017-05-31 NOTE — Telephone Encounter (Signed)
Left call back number for results.

## 2017-06-02 ENCOUNTER — Telehealth: Payer: Self-pay | Admitting: Family

## 2017-06-02 NOTE — Telephone Encounter (Signed)
I spoke with Benjamin [name] and went over his lab work with him in detail. He verbalized understanding. 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 We will plan to see him at his next follow-up in February.

## 2017-06-18 ENCOUNTER — Telehealth: Payer: Self-pay | Admitting: Family Medicine

## 2017-06-18 MED ORDER — ACYCLOVIR 5 % EX OINT: TOPICAL_OINTMENT | CUTANEOUS | 6 refills | Status: DC

## 2017-06-18 MED ORDER — VALACYCLOVIR HCL 500 MG PO TABS: 500.0000 mg | ORAL_TABLET | Freq: Every day | ORAL | 12 refills | Status: DC

## 2017-06-18 NOTE — Telephone Encounter (Signed)
Copied from [place] [id number]. Topic: Quick Communication - Rx Refill/Question >> Jun 18, 2017  9:29 AM Clack, Janett Billow D wrote: Medication: valACYclovir (VALTREX) 500 MG tablet [[id number]]    Has the patient contacted their pharmacy? No.   (Agent: If no, request that the patient contact the pharmacy for the refill.)   Preferred Pharmacy (with phone number or street name): [facility] [place], [place] [phone] (Phone) [phone] (Fax)  Pt would like to know if Dr Williemae Natter can call him in the ointment for valacyclovir instead of the tablet. Please f/u with pt.   Agent: Please be advised that RX refills may take up to 3 business days. We ask that you follow-up with your pharmacy.

## 2017-06-18 NOTE — Telephone Encounter (Signed)
Pt advised and voiced understanding.   

## 2017-06-18 NOTE — Telephone Encounter (Signed)
OK, stop valtrex pills. I sent in the ointment.

## 2017-06-18 NOTE — Telephone Encounter (Signed)
OK, yes it is fine to continue daily dose of valtrex pill.

## 2017-06-18 NOTE — Telephone Encounter (Signed)
Please advise. Thanks.  

## 2017-06-18 NOTE — Telephone Encounter (Signed)
SW pt and he stated that he was taking a daily maintenance dose of the valtrex. He had requested the ointment because his outbreak was very inflamed. Pt wants to know if he can continue the daily pill. Please advise. Thanks.

## 2017-06-19 ENCOUNTER — Encounter (HOSPITAL_COMMUNITY): Payer: Self-pay | Admitting: Emergency Medicine

## 2017-06-19 ENCOUNTER — Other Ambulatory Visit: Payer: Self-pay

## 2017-06-19 ENCOUNTER — Ambulatory Visit (HOSPITAL_COMMUNITY)
Admission: EM | Admit: 2017-06-19 | Discharge: 2017-06-19 | Disposition: A | Discharge status: Home / Self Care | Payer: BLUE CROSS/BLUE SHIELD | Attending: Physician Assistant | Admitting: Physician Assistant

## 2017-06-19 DIAGNOSIS — Z23 Encounter for immunization: Secondary | ICD-10-CM | POA: Diagnosis not present

## 2017-06-19 DIAGNOSIS — S61211A Laceration without foreign body of left index finger without damage to nail, initial encounter: Secondary | ICD-10-CM

## 2017-06-19 DIAGNOSIS — W260XXA Contact with knife, initial encounter: Secondary | ICD-10-CM | POA: Diagnosis not present

## 2017-06-19 MED ORDER — TETANUS-DIPHTH-ACELL PERTUSSIS 5-2.5-18.5 LF-MCG/0.5 IM SUSP
0.5000 mL | Freq: Once | INTRAMUSCULAR | Status: AC
  Administered 2017-06-19: 0.5 mL via INTRAMUSCULAR

## 2017-06-19 MED ORDER — TETANUS-DIPHTH-ACELL PERTUSSIS 5-2.5-18.5 LF-MCG/0.5 IM SUSP
INTRAMUSCULAR | Status: AC
  Filled 2017-06-19: qty 0.5

## 2017-06-19 NOTE — Discharge Instructions (Signed)
After about 10 days it is okay to start to work on getting the stitches out herself.  If you have any problems with this please come to my office and I will take them out for you for free.  If you have any pus, exquisite pain, redness, or streaking of the hand, wrist, arm please call [phone] and I will give you a call back to discuss the problem with it.

## 2017-06-19 NOTE — ED Provider Notes (Signed)
06/19/2017 12:40 PM   DOB: 12/07/1970 / MRN: [id number]  SUBJECTIVE:  Benjamin [name] is a 47 y.o. male presenting for  a left hand injury.  He was cutting a cucumber this morning and tells me "I almost cut the tip of my finger off."  He does take blood thinners.  Immunization History  Administered Date(s) Administered  . Influenza Split 03/26/2011  . Influenza,inj,Quad PF,6+ Mos 02/19/2014, 05/15/2015  . Influenza-Unspecified 02/13/2016, 03/10/2017  . Td 06/01/2008     He is allergic to compazine [prochlorperazine edisylate]; penicillins; and pepto-bismol [bismuth subsalicylate].   He  has a past medical history of Acute DVT of left tibial vein ([place]) (04/24/2017), Adenomatous colon polyp (10/04/15), ALLERGIC RHINITIS (07/11/2008), Allergy, Angiolipoma (2007), Anxiety, BACK PAIN, CHRONIC, INTERMITTENT (10/17/2008), Barrett's esophagus determined by biopsy (04/12/14), BENIGN PROSTATIC HYPERTROPHY, WITH URINARY OBSTRUCTION (07/21/2007), BURSITIS, HIP (09/30/2009), CAP (community acquired pneumonia) (06/2014), Cataracts, bilateral (2016), Chronic headaches, Chronic renal insufficiency, stage II (mild), Diverticulitis (2016), Frequent headaches, Gastric polyp (2006), GERD without esophagitis, Herpes labialis, Hiatal hernia, Hyperlipidemia, HYPERTENSION (01/05/2007), OSA (obstructive sleep apnea) (04/2017), and PSORIASIS (01/05/2007).    He  reports that  has never smoked. he has never used smokeless tobacco. He reports that he drinks about 1.2 oz of alcohol per week. He reports that he does not use drugs. He  has no sexual activity history on file. The patient  has a past surgical history that includes Lipoma excision; Esophagogastroduodenoscopy (04/12/14); and Colonoscopy w/ polypectomy (10/04/15).  His family history includes CAD in his father and paternal grandmother; Cancer in his maternal grandfather; Colon cancer in his unknown relative; Diabetes in his paternal grandmother; Heart disease in his father;  Hyperlipidemia in his brother and father; Hypertension in his brother, father, and mother; Lung cancer in his maternal grandmother; Prostate cancer in his unknown relative.  Review of Systems  Neurological: Negative for dizziness, tingling, tremors, sensory change, focal weakness and headaches.    OBJECTIVE:  BP (!) 143/107 (BP Location: Right Arm) Comment: reported BP to nurse Maudie Mercury Lapan Hutchins  Pulse 81   Temp 98.1 F (36.7 C) (Oral)   Resp 16   SpO2 100%   BP Readings from Last 3 Encounters:  06/19/17 (!) 143/107  05/24/17 121/87  05/18/17 115/74     Physical Exam  The patient is well-appearing and in no acute distress.  There is a laceration of the left distal index finger with a 0.5 cm skin flap overlying.  There is no nail involvement.  No results found for this or any previous visit (from the past 72 hour(s)).  No results found.  Risk and benefits discussed and verbal consent obtained. Anesthetic allergies reviewed. Patient anesthetized using 1:1 mix of 2% lidocaine without epi and Marcaine. The wound was cleansed thoroughly with soap and water. Sterile prep and drape. Wound closed with 2 throws using 4-0  dissolvable antibacterial suture material. Hemostasis achieved. Mupirocin applied to the wound and bandage placed. The patient tolerated well. Wound instructions were provided and the patient is to return in 10 days if needed for suture removal.   ASSESSMENT AND PLAN:   Laceration of left index finger without foreign body without damage to nail, initial encounter wound repaired here hemostasis controled.  RTC in 10 days only if needed given dissolvable suture.      The patient is advised to call or return to clinic if he does not see an improvement in symptoms, or to seek the care of the closest emergency department if he  worsens with the above plan.   Philis Fendt, MHS, PA-C 06/19/2017 12:40 PM    [name], PA-C 06/19/17 [id number]

## 2017-06-19 NOTE — ED Triage Notes (Signed)
Seen by Legrand Como, PA, prior to nursing.  Left index finger injury

## 2017-06-23 ENCOUNTER — Ambulatory Visit: Payer: BLUE CROSS/BLUE SHIELD | Admitting: Neurology

## 2017-07-02 ENCOUNTER — Encounter: Payer: Self-pay | Admitting: Physician Assistant

## 2017-07-02 ENCOUNTER — Ambulatory Visit (INDEPENDENT_AMBULATORY_CARE_PROVIDER_SITE_OTHER): Payer: BLUE CROSS/BLUE SHIELD | Admitting: Physician Assistant

## 2017-07-02 ENCOUNTER — Other Ambulatory Visit: Payer: Self-pay

## 2017-07-02 VITALS — BP 138/70 | HR 89 | Temp 98.7°F | Resp 16 | Ht 69.0 in | Wt 186.5 lb

## 2017-07-02 DIAGNOSIS — B9789 Other viral agents as the cause of diseases classified elsewhere: Secondary | ICD-10-CM | POA: Diagnosis not present

## 2017-07-02 DIAGNOSIS — J069 Acute upper respiratory infection, unspecified: Secondary | ICD-10-CM | POA: Diagnosis not present

## 2017-07-02 NOTE — Patient Instructions (Addendum)
Take tylenol 1000 mg every eight for sinus congestion.  If your not better in 109 days then mychart message me and I will send in some doxy.

## 2017-07-02 NOTE — Progress Notes (Addendum)
07/02/2017 12:12 PM   DOB: 1970-09-27 / MRN: [id number]  SUBJECTIVE:  Benjamin [name] is a 47 y.o. male with a history of factor V leiden presenting for sinus congestion. He is taking xarelto for now hematology plans to go on asa evetually.   Complains of new onset nasal congestion that started today.  Has a mild headache with this.  Also complains of some mild coughing.    He is here for suture removal as well.  These were placed at the urgent care at [facility].   He is allergic to compazine [prochlorperazine edisylate]; penicillins; and pepto-bismol [bismuth subsalicylate].   He  has a past medical history of Acute DVT of left tibial vein ([place]) (04/24/2017), Adenomatous colon polyp (10/04/15), ALLERGIC RHINITIS (07/11/2008), Allergy, Angiolipoma (2007), Anxiety, BACK PAIN, CHRONIC, INTERMITTENT (10/17/2008), Barrett's esophagus determined by biopsy (04/12/14), BENIGN PROSTATIC HYPERTROPHY, WITH URINARY OBSTRUCTION (07/21/2007), BURSITIS, HIP (09/30/2009), CAP (community acquired pneumonia) (06/2014), Cataracts, bilateral (2016), Chronic headaches, Chronic renal insufficiency, stage II (mild), Diverticulitis (2016), Frequent headaches, Gastric polyp (2006), GERD without esophagitis, Herpes labialis, Hiatal hernia, Hyperlipidemia, HYPERTENSION (01/05/2007), OSA (obstructive sleep apnea) (04/2017), and PSORIASIS (01/05/2007).    He  reports that  has never smoked. he has never used smokeless tobacco. He reports that he drinks about 1.2 oz of alcohol per week. He reports that he does not use drugs. He  has no sexual activity history on file. The patient  has a past surgical history that includes Lipoma excision; Esophagogastroduodenoscopy (04/12/14); and Colonoscopy w/ polypectomy (10/04/15).  His family history includes CAD in his father and paternal grandmother; Cancer in his maternal grandfather; Colon cancer in his unknown relative; Diabetes in his paternal grandmother; Heart disease in his father;  Hyperlipidemia in his brother and father; Hypertension in his brother, father, and mother; Lung cancer in his maternal grandmother; Prostate cancer in his unknown relative.  Review of Systems  Constitutional: Negative for chills and fever.  Respiratory: Positive for cough.   Cardiovascular: Negative for chest pain.  Skin: Negative for rash.  Neurological: Negative for dizziness.    The problem list and medications were reviewed and updated by myself where necessary and exist elsewhere in the encounter.   OBJECTIVE:  Pulse 89   Temp 98.7 F (37.1 C) (Oral)   Resp 16   Ht 5\' 9"  (1.753 m)   Wt 186 lb 8 oz (84.6 kg)   SpO2 100%   BMI 27.54 kg/m   Physical Exam  Constitutional: He appears well-developed. He is active and cooperative.  Non-toxic appearance.  HENT:  Right Ear: Hearing, tympanic membrane, external ear and ear canal normal.  Left Ear: Hearing, tympanic membrane, external ear and ear canal normal.  Nose: Nose normal. Right sinus exhibits no maxillary sinus tenderness and no frontal sinus tenderness. Left sinus exhibits no maxillary sinus tenderness and no frontal sinus tenderness.  Mouth/Throat: Uvula is midline, oropharynx is clear and moist and mucous membranes are normal. No oropharyngeal exudate, posterior oropharyngeal edema or tonsillar abscesses.  Eyes: Conjunctivae are normal. Pupils are equal, round, and reactive to light.  Cardiovascular: Normal rate, regular rhythm, S1 normal, S2 normal, normal heart sounds, intact distal pulses and normal pulses. Exam reveals no gallop and no friction rub.  No murmur heard. Pulmonary/Chest: Effort normal. No stridor. No tachypnea. No respiratory distress. He has no wheezes. He has no rales.  Abdominal: He exhibits no distension.  Musculoskeletal: He exhibits no edema.  Lymphadenopathy:       Head (right  side): No submandibular and no tonsillar adenopathy present.       Head (left side): No submandibular and no tonsillar  adenopathy present.    He has no cervical adenopathy.  Neurological: He is alert.  Skin: Skin is warm and dry. He is not diaphoretic. No pallor.  Vitals reviewed.   Lab Results  Component Value Date   WBC 5.7 05/24/2017   HGB 14.9 05/24/2017   HCT 42.2 05/24/2017   MCV 87 05/24/2017   PLT 266 05/24/2017    Lab Results  Component Value Date   CREATININE 1.4 (H) 05/24/2017   BUN 17 05/24/2017   NA 141 05/24/2017   K 3.6 05/24/2017   CL 106 05/24/2017   CO2 26 05/24/2017    Lab Results  Component Value Date   ALT 35 05/24/2017   AST 30 05/24/2017   ALKPHOS 66 05/24/2017   BILITOT 1.00 05/24/2017    Lab Results  Component Value Date   TSH 1.38 04/26/2017    No results found for: HGBA1C  Lab Results  Component Value Date   CHOL 195 10/23/2016   HDL 37.20 (L) 10/23/2016   LDLCALC 117 (H) 12/12/2013   LDLDIRECT 94.0 10/23/2016   TRIG 282.0 (H) 10/23/2016   CHOLHDL 5 10/23/2016   BP Readings from Last 3 Encounters:  07/02/17 138/70  06/19/17 (!) 143/107  05/24/17 121/87     No results found for this or any previous visit (from the past 72 hour(s)).  No results found.  ASSESSMENT AND PLAN:  Diagnoses and all orders for this visit:  Viral URI with cough: Patient with a history of acute bacterial rhinosinusitis.  Advised that if his illness is not remissed after 10 days time that he can call the office and I will place him on doxycycline 100 mg twice daily.  This is worked well for him in the past.    The patient is advised to call or return to clinic if he does not see an improvement in symptoms, or to seek the care of the closest emergency department if he worsens with the above plan.   Philis Fendt, MHS, PA-C [facility] 07/02/2017 12:12 PM

## 2017-07-05 ENCOUNTER — Inpatient Hospital Stay: Payer: BLUE CROSS/BLUE SHIELD

## 2017-07-05 ENCOUNTER — Other Ambulatory Visit: Payer: Self-pay

## 2017-07-05 ENCOUNTER — Telehealth: Payer: Self-pay

## 2017-07-05 ENCOUNTER — Encounter: Payer: Self-pay | Admitting: Family

## 2017-07-05 ENCOUNTER — Other Ambulatory Visit: Payer: Self-pay | Admitting: Physician Assistant

## 2017-07-05 ENCOUNTER — Ambulatory Visit (HOSPITAL_BASED_OUTPATIENT_CLINIC_OR_DEPARTMENT_OTHER)
Admission: RE | Admit: 2017-07-05 | Discharge: 2017-07-05 | Disposition: A | Discharge status: Home / Self Care | Payer: BLUE CROSS/BLUE SHIELD | Source: Ambulatory Visit | Attending: Family | Admitting: Family

## 2017-07-05 ENCOUNTER — Inpatient Hospital Stay: Payer: BLUE CROSS/BLUE SHIELD | Attending: Family | Admitting: Family

## 2017-07-05 ENCOUNTER — Encounter: Payer: Self-pay | Admitting: Physician Assistant

## 2017-07-05 VITALS — BP 126/92 | HR 93 | Temp 98.9°F | Resp 16 | Wt 182.0 lb

## 2017-07-05 DIAGNOSIS — Z86718 Personal history of other venous thrombosis and embolism: Secondary | ICD-10-CM | POA: Diagnosis not present

## 2017-07-05 DIAGNOSIS — I82442 Acute embolism and thrombosis of left tibial vein: Secondary | ICD-10-CM | POA: Insufficient documentation

## 2017-07-05 DIAGNOSIS — Z7901 Long term (current) use of anticoagulants: Secondary | ICD-10-CM | POA: Insufficient documentation

## 2017-07-05 DIAGNOSIS — Z79899 Other long term (current) drug therapy: Secondary | ICD-10-CM | POA: Diagnosis not present

## 2017-07-05 LAB — CBC WITH DIFFERENTIAL (CANCER CENTER ONLY)
Basophils Absolute: 0.1 10*3/uL (ref 0.0–0.1)
Basophils Relative: 1 %
Eosinophils Absolute: 0.1 10*3/uL (ref 0.0–0.5)
Eosinophils Relative: 1 %
HCT: 46.2 % (ref 38.7–49.9)
Hemoglobin: 15.7 g/dL (ref 13.0–17.1)
Lymphocytes Relative: 27 %
Lymphs Abs: 2.5 10*3/uL (ref 0.9–3.3)
MCH: 29.8 pg (ref 28.0–33.4)
MCHC: 34 g/dL (ref 32.0–35.9)
MCV: 87.7 fL (ref 82.0–98.0)
Monocytes Absolute: 1.2 10*3/uL — ABNORMAL HIGH (ref 0.1–0.9)
Monocytes Relative: 13 %
Neutro Abs: 5.3 10*3/uL (ref 1.5–6.5)
Neutrophils Relative %: 58 %
Platelet Count: 226 10*3/uL (ref 145–400)
RBC: 5.27 MIL/uL (ref 4.20–5.70)
RDW: 12.7 % (ref 11.1–15.7)
WBC Count: 9.1 10*3/uL (ref 4.0–10.0)

## 2017-07-05 LAB — COMPREHENSIVE METABOLIC PANEL
ALT: 28 U/L (ref 0–55)
AST: 22 U/L (ref 5–34)
Albumin: 4.2 g/dL (ref 3.5–5.0)
Alkaline Phosphatase: 81 U/L (ref 40–150)
Anion gap: 9 (ref 3–11)
BUN: 19 mg/dL (ref 7–26)
CO2: 23 mmol/L (ref 22–29)
Calcium: 9.6 mg/dL (ref 8.4–10.4)
Chloride: 106 mmol/L (ref 98–109)
Creatinine, Ser: 1.39 mg/dL — ABNORMAL HIGH (ref 0.70–1.30)
GFR calc Af Amer: >60 mL/min (ref >60)
GFR calc non Af Amer: 59 mL/min — ABNORMAL LOW (ref >60)
Glucose, Bld: 91 mg/dL (ref 70–140)
Potassium: 4 mmol/L (ref 3.5–5.1)
Sodium: 138 mmol/L (ref 136–145)
Total Bilirubin: 0.8 mg/dL (ref 0.2–1.2)
Total Protein: 8.8 g/dL — ABNORMAL HIGH (ref 6.4–8.3)

## 2017-07-05 LAB — D-DIMER, QUANTITATIVE: D-Dimer, Quant: <0.27 ug/mL-FEU (ref 0.00–0.50)

## 2017-07-05 MED ORDER — DOXYCYCLINE HYCLATE 100 MG PO CAPS: 100.0000 mg | ORAL_CAPSULE | Freq: Two times a day (BID) | ORAL | 0 refills | Status: DC

## 2017-07-05 NOTE — Telephone Encounter (Signed)
Pt advised that we have called in doxy to pharmacy.

## 2017-07-05 NOTE — Telephone Encounter (Addendum)
Per 07/02/17 office visit note with Philis Fendt - "Advised that if his illness is not remissed after 10 days time that he can call the office and I will place him on doxycycline 100 mg twice daily. "  Provider, ok to call in medication for patient? If so, please prescribe.   Tye [place] Communication 07/05/2017 04:01 PM    Pt called to ask if he could have a Rx called in for him b/c pt states he was seen by Carlis Abbott on Friday, and pcp is aware of symptoms he is having, contact pt to advise to state whether a Rx can be called in or if appt is needed

## 2017-07-05 NOTE — Progress Notes (Signed)
   Unilateral left lower ext. Venous duplex, negative for acute DVT Benjamin [name], Benjamin [name] 07/05/2017, 11:22 AM

## 2017-07-05 NOTE — Progress Notes (Signed)
Hematology and Oncology Follow Up Visit  KELIN BORUM [id number] January 01, 1971 47 y.o. 07/05/2017   Principle Diagnosis:  Acute DVT of the left posterior tibial vein Acute superficial thombosis of the small saphenous vein   Current Therapy:   Xarelto 20 mg PO daily    Interim History:  Mr. Difrancesco is here today for follow-up. He is sick with a cold. He denies a fever. He has sinus congestion and dry cough. He has had some nausea and vomited several times. He has had some mild SOB with occasional dizziness.  He is doing well on Xarelto and taking as prescribed. No episodes of bleeding, no bruising or petechiae.  He had an US of the left lower extremity today. Results are pending.  No chills, rash, chest pain, palpitations, abdominal pain or changes in bowel or bladder habits.   No swelling, tenderness, numbness or tingling in his extremities.  No lymphadenopathy found on exam.  He doesn't have much of an appetite right now and states that he needs to better hydrate.   ECOG Performance Status: 1 - Symptomatic but completely ambulatory  Medications:  Allergies as of 07/05/2017      Reactions   Compazine [prochlorperazine Edisylate] Other (See Comments)   Cervical dystonia and panic attack   Penicillins Other (See Comments)   Childhood allergy Has patient had a PCN reaction causing immediate rash, facial/tongue/throat swelling, SOB or lightheadedness with hypotension: unknown Has patient had a PCN reaction causing severe rash involving mucus membranes or skin necrosis: Unknown Has patient had a PCN reaction that required hospitalization: Unknown Has patient had a PCN reaction occurring within the last 10 years: Unknown If all of the above answers are "NO", then may proceed with Cephalosporin use.   Pepto-bismol [bismuth Subsalicylate] Swelling      Medication List        Accurate as of 07/05/17 11:56 AM. Always use your most recent med list.          acyclovir ointment 5  % Commonly known as:  ZOVIRAX Apply to affected areas every 3 hours as needed   ALPRAZolam 0.25 MG tablet Commonly known as:  XANAX Take 1-2 tabs 30-60 minutes before MRI may repeat if needed. Do not drive.   bisoprolol-hydrochlorothiazide 5-6.25 MG tablet Commonly known as:  ZIAC TAKE 1 TABLET BY MOUTH DAILY.   buPROPion 300 MG 24 hr tablet Commonly known as:  WELLBUTRIN XL Take 1 tablet (300 mg total) by mouth daily.   fluticasone 50 MCG/ACT nasal spray Commonly known as:  FLONASE Place 2 sprays into both nostrils daily.   hyoscyamine 0.125 MG SL tablet Commonly known as:  LEVSIN SL Place 1 tablet (0.125 mg total) under the tongue every 4 (four) hours as needed for cramping.   ondansetron 4 MG tablet Commonly known as:  ZOFRAN TAKE 1 TABLET BY MOUTH EVERY 8 HOURS AS NEEDED FOR NAUSEA AND VOMITING   RABEprazole 20 MG tablet Commonly known as:  ACIPHEX Take 1 tablet (20 mg total) by mouth daily.   rivaroxaban 20 MG Tabs tablet Commonly known as:  XARELTO Take 1 tablet (20 mg total) by mouth daily with supper.   rizatriptan 10 MG disintegrating tablet Commonly known as:  MAXALT-MLT Take 1 tablet (10 mg total) as needed by mouth for migraine. May repeat in 2 hours if needed   rosuvastatin 40 MG tablet Commonly known as:  CRESTOR TAKE 1 TABLET BY MOUTH ONCE A WEEK.   tamsulosin 0.4 MG Caps capsule Commonly known  as:  FLOMAX TAKE 1 CAPSULE BY MOUTH EVERY DAY   tiZANidine 4 MG tablet Commonly known as:  ZANAFLEX Take 1 tablet (4 mg total) every 6 (six) hours as needed by mouth for muscle spasms. Or for headaches.   Topiramate ER 100 MG Cs24 sprinkle capsule Commonly known as:  QUDEXY XR Take 100 mg at bedtime as needed by mouth.   valACYclovir 500 MG tablet Commonly known as:  VALTREX Take 1 tablet (500 mg total) by mouth daily.       Allergies:  Allergies  Allergen Reactions  . Compazine [Prochlorperazine Edisylate] Other (See Comments)    Cervical  dystonia and panic attack  . Penicillins Other (See Comments)    Childhood allergy Has patient had a PCN reaction causing immediate rash, facial/tongue/throat swelling, SOB or lightheadedness with hypotension: unknown Has patient had a PCN reaction causing severe rash involving mucus membranes or skin necrosis: Unknown Has patient had a PCN reaction that required hospitalization: Unknown Has patient had a PCN reaction occurring within the last 10 years: Unknown If all of the above answers are "NO", then may proceed with Cephalosporin use.    . Pepto-Bismol [Bismuth Subsalicylate] Swelling    Past Medical History, Surgical history, Social history, and Family History were reviewed and updated.  Review of Systems: All other 10 point review of systems is negative.   Physical Exam:  vitals were not taken for this visit.   Wt Readings from Last 3 Encounters:  07/02/17 186 lb 8 oz (84.6 kg)  05/24/17 189 lb (85.7 kg)  05/18/17 191 lb 4 oz (86.8 kg)    Ocular: Sclerae unicteric, pupils equal, round and reactive to light Ear-nose-throat: Oropharynx clear, dentition fair Lymphatic: No cervical, supraclavicular or axillary adenopathy Lungs no rales or rhonchi, good excursion bilaterally Heart regular rate and rhythm, no murmur appreciated Abd soft, nontender, positive bowel sounds, no liver or spleen tip palpated on exam, no fluid wave  MSK no focal spinal tenderness, no joint edema Neuro: non-focal, well-oriented, appropriate affect Breasts: Deferred   Lab Results  Component Value Date   WBC 9.1 07/05/2017   HGB 14.9 05/24/2017   HCT 46.2 07/05/2017   MCV 87.7 07/05/2017   PLT 226 07/05/2017   Lab Results  Component Value Date   FERRITIN 111 05/24/2017   IRON 65 05/24/2017   TIBC 361 05/24/2017   UIBC 296 05/24/2017   IRONPCTSAT 18 (L) 05/24/2017   Lab Results  Component Value Date   RBC 5.27 07/05/2017   No results found for: KPAFRELGTCHN, LAMBDASER, KAPLAMBRATIO No  results found for: IGGSERUM, IGA, IGMSERUM No results found for: Ronnald Ramp, A1GS, A2GS, Tillman Sers, SPEI   Chemistry      Component Value Date/Time   NA 141 05/24/2017 0845   K 3.6 05/24/2017 0845   CL 106 05/24/2017 0845   CO2 26 05/24/2017 0845   BUN 17 05/24/2017 0845   CREATININE 1.4 (H) 05/24/2017 0845      Component Value Date/Time   CALCIUM 9.0 05/24/2017 0845   ALKPHOS 66 05/24/2017 0845   AST 30 05/24/2017 0845   ALT 35 05/24/2017 0845   BILITOT 1.00 05/24/2017 0845      Impression and Plan: Mr. Bielinski is a pleasant 47 yo caucasian gentleman with DVT of the left posterior tibial vein and an acute superficial thrombosis of the small saphenous vein. Unfortunately he has a cold right now and is not feeling well. He has had a nice response on Xarelto.  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  We will see him back in another 3 months for follow-up and lab.  He will contact our office with any questions or concerns. We can certainly see him sooner if need be.   Laverna Peace, NP 2/4/201911:56 AM

## 2017-07-06 ENCOUNTER — Other Ambulatory Visit: Payer: Self-pay | Admitting: Family Medicine

## 2017-07-06 ENCOUNTER — Ambulatory Visit (INDEPENDENT_AMBULATORY_CARE_PROVIDER_SITE_OTHER): Payer: BLUE CROSS/BLUE SHIELD | Admitting: Family Medicine

## 2017-07-06 ENCOUNTER — Encounter: Payer: Self-pay | Admitting: Family Medicine

## 2017-07-06 VITALS — BP 132/86 | HR 105 | Temp 97.9°F | Ht 69.0 in | Wt 179.8 lb

## 2017-07-06 DIAGNOSIS — J320 Chronic maxillary sinusitis: Secondary | ICD-10-CM

## 2017-07-06 DIAGNOSIS — J40 Bronchitis, not specified as acute or chronic: Secondary | ICD-10-CM

## 2017-07-06 MED ORDER — BENZONATATE 200 MG PO CAPS: 200.0000 mg | ORAL_CAPSULE | Freq: Two times a day (BID) | ORAL | 0 refills | Status: DC | PRN

## 2017-07-06 MED ORDER — HYDROCODONE-HOMATROPINE 5-1.5 MG/5ML PO SYRP: 5.0000 mL | ORAL_SOLUTION | Freq: Four times a day (QID) | ORAL | 0 refills | Status: DC | PRN

## 2017-07-06 NOTE — Patient Instructions (Addendum)
Start doxycyline already prescribed.  Rest, hydrate.  + flonase, mucinex (DM if cough), nasal saline.  Tessalon perles for cough (in the day time with mucinex DM).  Hycodan cough syrup (can cause sedation) If cough present it can last up to 6-8 weeks.  F/U 2 weeks of not improved with PCP   Sinusitis, Adult Sinusitis is soreness and inflammation of your sinuses. Sinuses are hollow spaces in the bones around your face. They are located:  Around your eyes.  In the middle of your forehead.  Behind your nose.  In your cheekbones.  Your sinuses and nasal passages are lined with a stringy fluid (mucus). Mucus normally drains out of your sinuses. When your nasal tissues get inflamed or swollen, the mucus can get trapped or blocked so air cannot flow through your sinuses. This lets bacteria, viruses, and funguses grow, and that leads to infection. Follow these instructions at home: Medicines  Take, use, or apply over-the-counter and prescription medicines only as told by your doctor. These may include nasal sprays.  If you were prescribed an antibiotic medicine, take it as told by your doctor. Do not stop taking the antibiotic even if you start to feel better. Hydrate and Humidify  Drink enough water to keep your pee (urine) clear or pale yellow.  Use a cool mist humidifier to keep the humidity level in your home above 50%.  Breathe in steam for 10-15 minutes, 3-4 times a day or as told by your doctor. You can do this in the bathroom while a hot shower is running.  Try not to spend time in cool or dry air. Rest  Rest as much as possible.  Sleep with your head raised (elevated).  Make sure to get enough sleep each night. General instructions  Put a warm, moist washcloth on your face 3-4 times a day or as told by your doctor. This will help with discomfort.  Wash your hands often with soap and water. If there is no soap and water, use hand sanitizer.  Do not smoke. Avoid being  around people who are smoking (secondhand smoke).  Keep all follow-up visits as told by your doctor. This is important. Contact a doctor if:  You have a fever.  Your symptoms get worse.  Your symptoms do not get better within 10 days. Get help right away if:  You have a very bad headache.  You cannot stop throwing up (vomiting).  You have pain or swelling around your face or eyes.  You have trouble seeing.  You feel confused.  Your neck is stiff.  You have trouble breathing. This information is not intended to replace advice given to you by your health care provider. Make sure you discuss any questions you have with your health care provider. Document Released: 11/04/2007 Document Revised: 01/12/2016 Document Reviewed: 03/13/2015 Elsevier Interactive Patient Education  [employer].

## 2017-07-06 NOTE — Progress Notes (Signed)
Benjamin [name] , 1971/02/27, 47 y.o., male MRN: [id number] Patient Care Team    Relationship Specialty Notifications Start End  McGowen, Adrian Blackwater, MD PCP - General Family Medicine  03/06/14    Comment: Dr. Arnoldo Morale transfer  Darleen Crocker, MD Consulting Physician Ophthalmology  08/25/14   Ladene Artist, MD Consulting Physician Gastroenterology  12/18/14   Star Age, MD Consulting Physician Neurology  04/25/17    Comment: Sleep MD  Melvenia Beam, MD Consulting Physician Neurology  04/25/17   Volanda Napoleon, MD Consulting Physician Oncology  05/26/17     Chief Complaint  Patient presents with  . URI    pt congestion, cough,nausea, fatigue,body ache and headache X 4 days. try mucinex, pseudofid for temproral relief     Subjective: Pt presents for an OV with complaints of cough of 4-5 days duration.  Associated symptoms include nasal congestion, runny nose, cough, nausea, fatigue, body aches and headaches. Patient reports he is unable to sleep secondary to cough. He has been taking Mucinex and Sudafed. He reports he cannot lay flat secondary to the nasal congestion. He denies fever, chills, nausea, vomiting or diarrhea.  Depression screen [facility] 2/9 07/02/2017 10/23/2016  Decreased Interest 1 1  Down, Depressed, Hopeless 0 0  PHQ - 2 Score 1 1  Altered sleeping - 3  Tired, decreased energy - 2  Change in appetite - 1  Feeling bad or failure about yourself  - 0  Trouble concentrating - 2  Moving slowly or fidgety/restless - 0  Suicidal thoughts - 0  PHQ-9 Score - 9    Allergies  Allergen Reactions  . Compazine [Prochlorperazine Edisylate] Other (See Comments)    Cervical dystonia and panic attack  . Penicillins Other (See Comments)    Childhood allergy Has patient had a PCN reaction causing immediate rash, facial/tongue/throat swelling, SOB or lightheadedness with hypotension: unknown Has patient had a PCN reaction causing severe rash involving mucus membranes or skin  necrosis: Unknown Has patient had a PCN reaction that required hospitalization: Unknown Has patient had a PCN reaction occurring within the last 10 years: Unknown If all of the above answers are "NO", then may proceed with Cephalosporin use.    . Pepto-Bismol [Bismuth Subsalicylate] Swelling   Social History   Tobacco Use  . Smoking status: Never Smoker  . Smokeless tobacco: Never Used  Substance Use Topics  . Alcohol use: Yes    Alcohol/week: 1.2 oz    Types: 2 Cans of beer per week    Comment: occasionally   Past Medical History:  Diagnosis Date  . Acute DVT of left tibial vein ([place]) 04/24/2017   Xarelto started (dx'd in emergency dept).  Dr. Marin Olp recommended full dose xarelto x 27mo, with an additional year of maintenance dose xarelto (as of 05/2017 initial consult note).  . Adenomatous colon polyp 10/04/15  . ALLERGIC RHINITIS 07/11/2008  . Allergy    SEASONAL  . 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 Anxiety   . BACK PAIN, CHRONIC, INTERMITTENT 10/17/2008  . Barrett's esophagus determined by biopsy 04/12/14  . BENIGN PROSTATIC HYPERTROPHY, WITH URINARY OBSTRUCTION 07/21/2007  . BURSITIS, HIP 09/30/2009  . CAP (community acquired pneumonia) 06/2014  . Cataracts, bilateral 2016   Not visually significant  . Chronic headaches    Migraine syndrome: worsening fall 2018, neuro eval 04/2017--topamax and maxalt started, MRI brain ordered (LP to be done if MRI neg--pt reporting fevers).  Pt referred for sleep study.  Marland Kitchen  Chronic renal insufficiency, stage II (mild)    GFR 60s-70s (suspect HTN and NSAID damage as etiology).  GFR dipped into 50s fall 2018 after lots of NSAID use for chronic daily HA's.  . Diverticulitis 2016   CT 01/2015  . Frequent headaches   . Gastric polyp 2006  . GERD without esophagitis   . Herpes labialis   . Hiatal hernia   . Hyperlipidemia   . HYPERTENSION 01/05/2007  . OSA (obstructive sleep apnea) 04/2017   Dr. Rexene Alberts eval 04/2017-plan for sleep study.    Marland Kitchen PSORIASIS 01/05/2007   Past Surgical History:  Procedure Laterality Date  . COLONOSCOPY W/ POLYPECTOMY  10/04/15   56mm tubular adenoma + diverticulosis: recall 5 yrs (Dr. Fuller Plan)  . ESOPHAGOGASTRODUODENOSCOPY  04/12/14   Barrett's esophagus  . LIPOMA EXCISION     several, arm   Family History  Problem Relation Age of Onset  . CAD Father        around age 85  . Heart disease Father   . Hyperlipidemia Father   . Hypertension Father   . CAD Paternal Grandmother   . Diabetes Paternal Grandmother   . Hypertension Mother   . Hypertension Brother   . Hyperlipidemia Brother   . Lung cancer Maternal Grandmother   . Cancer Maternal Grandfather        type unknown  . Colon cancer Unknown        neg hx  . Prostate cancer Unknown        neg hx   Allergies as of 07/06/2017      Reactions   Compazine [prochlorperazine Edisylate] Other (See Comments)   Cervical dystonia and panic attack   Penicillins Other (See Comments)   Childhood allergy Has patient had a PCN reaction causing immediate rash, facial/tongue/throat swelling, SOB or lightheadedness with hypotension: unknown Has patient had a PCN reaction causing severe rash involving mucus membranes or skin necrosis: Unknown Has patient had a PCN reaction that required hospitalization: Unknown Has patient had a PCN reaction occurring within the last 10 years: Unknown If all of the above answers are "NO", then may proceed with Cephalosporin use.   Pepto-bismol [bismuth Subsalicylate] Swelling      Medication List        Accurate as of 07/06/17  4:38 PM. Always use your most recent med list.          acyclovir ointment 5 % Commonly known as:  ZOVIRAX Apply to affected areas every 3 hours as needed   ALPRAZolam 0.25 MG tablet Commonly known as:  XANAX Take 1-2 tabs 30-60 minutes before MRI may repeat if needed. Do not drive.   benzonatate 200 MG capsule Commonly known as:  TESSALON Take 1 capsule (200 mg total) by mouth 2 (two)  times daily as needed for cough.   bisoprolol-hydrochlorothiazide 5-6.25 MG tablet Commonly known as:  ZIAC TAKE 1 TABLET BY MOUTH DAILY.   buPROPion 300 MG 24 hr tablet Commonly known as:  WELLBUTRIN XL TAKE 1 TABLET BY MOUTH EVERY DAY   doxycycline 100 MG capsule Commonly known as:  VIBRAMYCIN Take 1 capsule (100 mg total) by mouth 2 (two) times daily for 10 days.   fluticasone 50 MCG/ACT nasal spray Commonly known as:  FLONASE Place 2 sprays into both nostrils daily.   HYDROcodone-homatropine 5-1.5 MG/5ML syrup Commonly known as:  HYCODAN Take 5 mLs by mouth every 6 (six) hours as needed for cough.   hyoscyamine 0.125 MG SL tablet Commonly known as:  LEVSIN SL Place 1 tablet (0.125 mg total) under the tongue every 4 (four) hours as needed for cramping.   ondansetron 4 MG tablet Commonly known as:  ZOFRAN TAKE 1 TABLET BY MOUTH EVERY 8 HOURS AS NEEDED FOR NAUSEA AND VOMITING   RABEprazole 20 MG tablet Commonly known as:  ACIPHEX Take 1 tablet (20 mg total) by mouth daily.   rivaroxaban 20 MG Tabs tablet Commonly known as:  XARELTO Take 1 tablet (20 mg total) by mouth daily with supper.   rizatriptan 10 MG disintegrating tablet Commonly known as:  MAXALT-MLT Take 1 tablet (10 mg total) as needed by mouth for migraine. May repeat in 2 hours if needed   rosuvastatin 40 MG tablet Commonly known as:  CRESTOR TAKE 1 TABLET BY MOUTH ONCE A WEEK.   tamsulosin 0.4 MG Caps capsule Commonly known as:  FLOMAX TAKE 1 CAPSULE BY MOUTH EVERY DAY   tiZANidine 4 MG tablet Commonly known as:  ZANAFLEX Take 1 tablet (4 mg total) every 6 (six) hours as needed by mouth for muscle spasms. Or for headaches.   Topiramate ER 100 MG Cs24 sprinkle capsule Commonly known as:  QUDEXY XR Take 100 mg at bedtime as needed by mouth.   valACYclovir 500 MG tablet Commonly known as:  VALTREX Take 1 tablet (500 mg total) by mouth daily.       All past medical history, surgical history,  allergies, family history, immunizations andmedications were updated in the EMR today and reviewed under the history and medication portions of their EMR.     ROS: Negative, with the exception of above mentioned in HPI   Objective:  BP 132/86 (BP Location: Left Arm, Patient Position: Sitting, Cuff Size: Normal)   Pulse (!) 105   Temp 97.9 F (36.6 C) (Oral)   Ht 5\' 9"  (1.753 m)   Wt 179 lb 12.8 oz (81.6 kg)   SpO2 99%   BMI 26.55 kg/m  Body mass index is 26.55 kg/m. Gen: Afebrile. No acute distress. Nontoxic in appearance, well developed, well nourished.  HENT: AT. [place]. Bilateral TM visualized without erythema or fullness. MMM, no oral lesions. Bilateral nares with erythema, drainage and swelling. Throat with erythema, no exudates. Postnasal drip present. Hoarseness present. Cough present. Sinus pressure present.  Eyes:Pupils Equal Round Reactive to light, Extraocular movements intact,  Conjunctiva without redness, discharge or icterus. Neck/lymp/endocrine: Supple, no lymphadenopathy CV: RRR  Chest: CTAB, no wheeze or crackles. Good air movement, normal resp effort.  Abd: Soft. NTND. BS present Neuro:  Normal gait. PERLA. EOMi. Alert. Oriented x3  No exam data present No results found. No results found for this or any previous visit (from the past 24 hour(s)).  Assessment/Plan: RICHAD RAMSAY is a 47 y.o. male present for OV for  Maxillary sinusitis, unspecified chronicity Bronchitis - Features of both sinusitis and bronchitis on exam today. His initial blood pressure was elevated, likely secondary to Sudafed use. Patient was encouraged to stop using Sudafed. - Rest, hydrate.  + flonase, mucinex (DM if cough), nettie pot or nasal saline.  Tessalon Perles and Hycodan cough syrup provided today. He had been prescribed doxycycline by another provider yesterday, but has not started it. Encouraged him to start doxycycline twice a day 10 days. If cough present it can last up to 6-8  weeks.  F/U 2 weeks of not improved with PCP    Reviewed expectations re: course of current medical issues.  Discussed self-management of symptoms.  Outlined signs and symptoms  indicating need for more acute intervention.  Patient verbalized understanding and all questions were answered.  Patient received an After-Visit Summary.    No orders of the defined types were placed in this encounter.    Note is dictated utilizing voice recognition software. Although note has been proof read prior to signing, occasional typographical errors still can be missed. If any questions arise, please do not hesitate to call for verification.   electronically signed by:  Howard Pouch, DO  [place]

## 2017-07-12 ENCOUNTER — Encounter: Payer: Self-pay | Admitting: Family Medicine

## 2017-07-15 ENCOUNTER — Ambulatory Visit (INDEPENDENT_AMBULATORY_CARE_PROVIDER_SITE_OTHER): Payer: BLUE CROSS/BLUE SHIELD | Admitting: Family Medicine

## 2017-07-15 ENCOUNTER — Other Ambulatory Visit: Payer: Self-pay

## 2017-07-15 ENCOUNTER — Encounter: Payer: Self-pay | Admitting: Family Medicine

## 2017-07-15 VITALS — BP 102/70 | HR 90 | Temp 98.0°F | Resp 16 | Ht 69.29 in | Wt 178.0 lb

## 2017-07-15 DIAGNOSIS — J209 Acute bronchitis, unspecified: Secondary | ICD-10-CM

## 2017-07-15 DIAGNOSIS — J0101 Acute recurrent maxillary sinusitis: Secondary | ICD-10-CM

## 2017-07-15 MED ORDER — AZITHROMYCIN 250 MG PO TABS: ORAL_TABLET | ORAL | 0 refills | Status: DC

## 2017-07-15 MED ORDER — HYDROCOD POLST-CPM POLST ER 10-8 MG/5ML PO SUER: 5.0000 mL | Freq: Two times a day (BID) | ORAL | 0 refills | Status: DC | PRN

## 2017-07-15 NOTE — Progress Notes (Signed)
Subjective:  This chart was scribed for Shawnee Knapp, MD by Tamsen Roers, at [place] at [facility].  This patient was seen in room 3 and the patient's care was started at 11:04 AM.   Chief Complaint  Patient presents with  . Cough    with chest congestion and headache  . Nasal Congestion    x 3 weeks      Patient ID: Benjamin [name], male    DOB: 09-10-1970, 47 y.o.   MRN: [id number]  HPI  HPI Comments: Benjamin [name] is a 47 y.o. male who presents to Primary Care at [facility] complaining of a persistent cough with associated symptoms of chest/ nasal congestion and a headache onset two weeks ago.  Patient has been taking doxycycline and cough suppressants (which helped with the cough about one week ago) but states that it came back. Benjamin [name] has been taking Mucin-Ex DM which helps with the cough for the first 4-6 hours, but then wears off. Benjamin [name] has not been using Flonase. Denies any fevers/chills/nightsweats, chest pains/chest tightness, nausea or shortness of breath.  Patient has a history of migraines but has not used any medication for his headaches. Patient has a cataract in his left eye.   Patient was given tessalon perles for his current symptoms by another provider which did not seem to be effective.    Past Medical History:  Diagnosis Date  . Acute DVT of left tibial vein ([place]) 04/24/2017   Xarelto started (dx'd in emergency dept).  Dr. Marin Olp recommended full dose xarelto x 77mo, with an additional year of maintenance dose xarelto (as of 05/2017 initial consult note).  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  . Adenomatous colon polyp 10/04/15  . ALLERGIC RHINITIS 07/11/2008  . Allergy    SEASONAL  . 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 Anxiety   . BACK PAIN, CHRONIC, INTERMITTENT 10/17/2008  . Barrett's esophagus determined by biopsy 04/12/14  . BENIGN PROSTATIC HYPERTROPHY, WITH URINARY OBSTRUCTION 07/21/2007  . BURSITIS, HIP  09/30/2009  . CAP (community acquired pneumonia) 06/2014  . Cataracts, bilateral 2016   Not visually significant  . Chronic headaches    Migraine syndrome: worsening fall 2018, neuro eval 04/2017--topamax and maxalt started, MRI brain ordered (LP to be done if MRI neg--pt reporting fevers).  Pt referred for sleep study.  . Chronic renal insufficiency, stage II (mild)    GFR 60s-70s (suspect HTN and NSAID damage as etiology).  GFR dipped into 50s fall 2018 after lots of NSAID use for chronic daily HA's.  . Diverticulitis 2016   CT 01/2015  . Frequent headaches   . Gastric polyp 2006  . GERD without esophagitis   . Herpes labialis   . Hiatal hernia   . Hyperlipidemia   . HYPERTENSION 01/05/2007  . OSA (obstructive sleep apnea) 04/2017   Dr. Rexene Alberts eval 04/2017-plan for sleep study.  Marland Kitchen PSORIASIS 01/05/2007    Current Outpatient Medications on File Prior to Visit  Medication Sig Dispense Refill  . acyclovir ointment (ZOVIRAX) 5 % Apply to affected areas every 3 hours as needed 30 g 6  . ALPRAZolam (XANAX) 0.25 MG tablet Take 1-2 tabs 30-60 minutes before MRI may repeat if needed. Do not drive. 5 tablet 0  . benzonatate (TESSALON) 200 MG capsule Take 1 capsule (200 mg total) by mouth 2 (two) times daily as needed for cough. 20 capsule 0  . bisoprolol-hydrochlorothiazide (ZIAC) 5-6.25 MG tablet  TAKE 1 TABLET BY MOUTH DAILY. 90 tablet 1  . buPROPion (WELLBUTRIN XL) 300 MG 24 hr tablet TAKE 1 TABLET BY MOUTH EVERY DAY 90 tablet 1  . doxycycline (VIBRAMYCIN) 100 MG capsule Take 1 capsule (100 mg total) by mouth 2 (two) times daily for 10 days. 20 capsule 0  . fluticasone (FLONASE) 50 MCG/ACT nasal spray Place 2 sprays into both nostrils daily. 16 g 6  . HYDROcodone-homatropine (HYCODAN) 5-1.5 MG/5ML syrup Take 5 mLs by mouth every 6 (six) hours as needed for cough. 120 mL 0  . hyoscyamine (LEVSIN SL) 0.125 MG SL tablet Place 1 tablet (0.125 mg total) under the tongue every 4 (four) hours as needed for  cramping. 120 tablet 11  . ondansetron (ZOFRAN) 4 MG tablet TAKE 1 TABLET BY MOUTH EVERY 8 HOURS AS NEEDED FOR NAUSEA AND VOMITING  0  . RABEprazole (ACIPHEX) 20 MG tablet Take 1 tablet (20 mg total) by mouth daily. 90 tablet 3  . rivaroxaban (XARELTO) 20 MG TABS tablet Take 1 tablet (20 mg total) by mouth daily with supper. 30 tablet 2  . rizatriptan (MAXALT-MLT) 10 MG disintegrating tablet Take 1 tablet (10 mg total) as needed by mouth for migraine. May repeat in 2 hours if needed 10 tablet 0  . rosuvastatin (CRESTOR) 40 MG tablet TAKE 1 TABLET BY MOUTH ONCE A WEEK. 4 tablet 12  . tamsulosin (FLOMAX) 0.4 MG CAPS capsule TAKE 1 CAPSULE BY MOUTH EVERY DAY 90 capsule 1  . tiZANidine (ZANAFLEX) 4 MG tablet Take 1 tablet (4 mg total) every 6 (six) hours as needed by mouth for muscle spasms. Or for headaches. 60 tablet 6  . Topiramate ER (QUDEXY XR) 100 MG CS24 sprinkle capsule Take 100 mg at bedtime as needed by mouth. 30 each 11  . valACYclovir (VALTREX) 500 MG tablet Take 1 tablet (500 mg total) by mouth daily. 30 tablet 12  . [DISCONTINUED] lisinopril-hydrochlorothiazide (PRINZIDE,ZESTORETIC) 20-25 MG per tablet Take 1 tablet by mouth daily. 90 tablet 3   Current Facility-Administered Medications on File Prior to Visit  Medication Dose Route Frequency Provider Last Rate Last Dose  . gadopentetate dimeglumine (MAGNEVIST) injection 18 mL  18 mL Intravenous Once PRN Melvenia Beam, MD        Allergies  Allergen Reactions  . Compazine [Prochlorperazine Edisylate] Other (See Comments)    Cervical dystonia and panic attack  . Penicillins Other (See Comments)    Childhood allergy Has patient had a PCN reaction causing immediate rash, facial/tongue/throat swelling, SOB or lightheadedness with hypotension: unknown Has patient had a PCN reaction causing severe rash involving mucus membranes or skin necrosis: Unknown Has patient had a PCN reaction that required hospitalization: Unknown Has patient  had a PCN reaction occurring within the last 10 years: Unknown If all of the above answers are "NO", then may proceed with Cephalosporin use.    . Pepto-Bismol [Bismuth Subsalicylate] Swelling   Past Surgical History:  Procedure Laterality Date  . COLONOSCOPY W/ POLYPECTOMY  10/04/15   71mm tubular adenoma + diverticulosis: recall 5 yrs (Dr. Fuller Plan)  . ESOPHAGOGASTRODUODENOSCOPY  04/12/14   Barrett's esophagus  . LIPOMA EXCISION     several, arm   Family History  Problem Relation Age of Onset  . CAD Father        around age 5  . Heart disease Father   . Hyperlipidemia Father   . Hypertension Father   . CAD Paternal Grandmother   . Diabetes Paternal Grandmother   .  Hypertension Mother   . Hypertension Brother   . Hyperlipidemia Brother   . Lung cancer Maternal Grandmother   . Cancer Maternal Grandfather        type unknown  . Colon cancer Unknown        neg hx  . Prostate cancer Unknown        neg hx   Social History   Socioeconomic History  . Marital status: Single    Spouse name: None  . Number of children: 2  . Years of education: None  . Highest education level: None  Social Needs  . Financial resource strain: None  . Food insecurity - worry: None  . Food insecurity - inability: None  . Transportation needs - medical: None  . Transportation needs - non-medical: None  Occupational History  . Occupation: regional Veterinary surgeon    Comment: Network engineer  Tobacco Use  . Smoking status: Never Smoker  . Smokeless tobacco: Never Used  Substance and Sexual Activity  . Alcohol use: Yes    Alcohol/week: 1.2 oz    Types: 2 Cans of beer per week    Comment: occasionally  . Drug use: No  . Sexual activity: None  Other Topics Concern  . None  Social History Narrative   Currently separated as of 01/2014, 2 children.  One younger brother.   Occupation: Press photographer for [employer]   No tobacco.   Occ alcohol (1-2 x/month).  No hx of alc/drug prob.   Exercise:  runs about 2 times a week. Not as much lately due to medical state   Diet: regular american diet.   Right handed   1 cup coffee daily      Depression screen [facility] 2/9 07/15/2017 07/02/2017 10/23/2016  Decreased Interest 0 1 1  Down, Depressed, Hopeless 0 0 0  PHQ - 2 Score 0 1 1  Altered sleeping - - 3  Tired, decreased energy - - 2  Change in appetite - - 1  Feeling bad or failure about yourself  - - 0  Trouble concentrating - - 2  Moving slowly or fidgety/restless - - 0  Suicidal thoughts - - 0  PHQ-9 Score - - 9    Review of Systems  Constitutional: Negative for chills and fever.  HENT: Positive for congestion.   Eyes: Negative for pain, redness and itching.  Respiratory: Positive for cough. Negative for choking and shortness of breath.   Cardiovascular: Negative for chest pain.  Gastrointestinal: Negative for nausea and vomiting.  Musculoskeletal: Negative for neck pain and neck stiffness.  Neurological: Positive for headaches. Negative for speech difficulty.       Objective:   Physical Exam  Constitutional: Benjamin [name] is oriented to person, place, and time. No distress.  HENT:  Head: Normocephalic and atraumatic.  TMs retracts/injected, mid ear effusion. Nares with erythema, oropharynx with erythema, mucous membranes dry.   Neck:  positive submandibular tonsillar lymphadenopathy. Some anterior cervical lymphadenopathy bilaterally.   Cardiovascular:  Tachycardic but regular rhythm   Pulmonary/Chest: Effort normal. No respiratory distress.  Lungs clear, good air movement.   Neurological: Benjamin [name] is alert and oriented to person, place, and time.  Skin: Skin is warm and dry.  Psychiatric: Benjamin [name] has a normal mood and affect. His behavior is normal.   Vitals:   07/15/17 1025  BP: 102/70  Pulse: 90  Resp: 16  Temp: 98 F (36.7 C)  TempSrc: Oral  SpO2: 99%  Weight: 178 lb (80.7 kg)  Height: 5' 9.29" (  1.76 m)       Assessment & Plan:   Azithromycin and cough suppressant.  Use  Tylenol or Tizanidine if needed.   1. Acute bronchitis, unspecified organism   2. Acute recurrent maxillary sinusitis     Meds ordered this encounter  Medications  . azithromycin (ZITHROMAX) 250 MG tablet    Sig: Take 2 tabs PO x 1 dose, then 1 tab PO QD x 4 days    Dispense:  6 tablet    Refill:  0  . chlorpheniramine-HYDROcodone (TUSSIONEX PENNKINETIC ER) 10-8 MG/5ML SUER    Sig: Take 5 mLs by mouth every 12 (twelve) hours as needed for cough.    Dispense:  120 mL    Refill:  0    I personally performed the services described in this documentation, which was scribed in my presence. The recorded information has been reviewed and considered, and addended by me as needed.   Delman Cheadle, M.D.  Primary Care at [facility] [address] [place], [place] [address] [phone] phone [phone] fax  07/18/17 7:42 AM

## 2017-07-15 NOTE — Patient Instructions (Addendum)
I choose not to send in the nausea medicine for sleep/cough suppression/headaches - promethazine/Phenergan - as we discussed in the office due to its relation to Compazine to which you are allergic.  The cough from the bronchitis that you have developed will probably last for about 3 weeks - however, I would expect you to start feeling better within the net 3 days, 4 at most.  The antibiotic will last and keep acting in your system for almost 2 weeks.  I would expect for you to be feeling better in a week (though likely still with cough). If you are not feeling better in a week or the cough lasts more than 3 weeks, please come back in to our office for a repeat visit where we will want to check your blood counts, get a chest xray and consider breathing tests/treatments.  Continue the mucinex DM. Restart the flonase.  The tussionex cough syrup willl be sedating so it is recommended that you do not use this during the day when you may need to work or drive - ok to take during the day if you are going to be at home resting. Take 1 teaspon every night 12 hours before you need to leave for work.  If you are continuing to worsen, call and we will call you in a prednisone taper.  Some homemade cough syrups can be just as effective as the over-the-counter medications with much fewer side effects. Also they can be combined with over-the-counter and prescription cough regimens, to make them even more effective together. Below is a recipe for my favorite:  Sweet lemon, honey, and thyme cough syrup recipe Place half of a chopped lemon in a container and cover with half a cup of honey (raw is best but any will do).  Then boil a handful of fresh thyme sprigs or organic dry leaves with 2 cups of water uncovered until it is reduced to one cup. Strain this liquid into the jar with the lemon and honey to remove the steams and leaves and then shake it up. Use 1 teaspoonful as often as needed and it will store in the  refrigerator for at least a month.    IF you received an x-ray today, you will receive an invoice from [facility]. Please contact [facility] at [phone] with questions or concerns regarding your invoice.   IF you received labwork today, you will receive an invoice from [facility]. Please contact [facility] at [phone] with questions or concerns regarding your invoice.   Our billing staff will not be able to assist you with questions regarding bills from these companies.  You will be contacted with the lab results as soon as they are available. The fastest way to get your results is to activate your My Chart account. Instructions are located on the last page of this paperwork. If you have not heard from [place] regarding the results in 2 weeks, please contact this office.      Acute Bronchitis, Adult Acute bronchitis is sudden (acute) swelling of the air tubes (bronchi) in the lungs. Acute bronchitis causes these tubes to fill with mucus, which can make it hard to breathe. It can also cause coughing or wheezing. In adults, acute bronchitis usually goes away within 2 weeks. A cough caused by bronchitis may last up to 3 weeks. Smoking, allergies, and asthma can make the condition worse. Repeated episodes of bronchitis may cause further lung problems, such as chronic obstructive pulmonary disease (COPD). What are the causes?  This condition can be caused by germs and by substances that irritate the lungs, including:  Cold and flu viruses. This condition is most often caused by the same virus that causes a cold.  Bacteria.  Exposure to tobacco smoke, dust, fumes, and air pollution.  What increases the risk? This condition is more likely to develop in people who:  Have close contact with someone with acute bronchitis.  Are exposed to lung irritants, such as tobacco smoke, dust, fumes, and vapors.  Have a weak immune system.  Have a respiratory condition such as  asthma.  What are the signs or symptoms? Symptoms of this condition include:  A cough.  Coughing up clear, yellow, or green mucus.  Wheezing.  Chest congestion.  Shortness of breath.  A fever.  Body aches.  Chills.  A sore throat.  How is this diagnosed? This condition is usually diagnosed with a physical exam. During the exam, your health care provider may order tests, such as chest X-rays, to rule out other conditions. He or she may also:  Test a sample of your mucus for bacterial infection.  Check the level of oxygen in your blood. This is done to check for pneumonia.  Do a chest X-ray or lung function testing to rule out pneumonia and other conditions.  Perform blood tests.  Your health care provider will also ask about your symptoms and medical history. How is this treated? Most cases of acute bronchitis clear up over time without treatment. Your health care provider may recommend:  Drinking more fluids. Drinking more makes your mucus thinner, which may make it easier to breathe.  Taking a medicine for a fever or cough.  Taking an antibiotic medicine.  Using an inhaler to help improve shortness of breath and to control a cough.  Using a cool mist vaporizer or humidifier to make it easier to breathe.  Follow these instructions at home: Medicines  Take over-the-counter and prescription medicines only as told by your health care provider.  If you were prescribed an antibiotic, take it as told by your health care provider. Do not stop taking the antibiotic even if you start to feel better. General instructions  Get plenty of rest.  Drink enough fluids to keep your urine clear or pale yellow.  Avoid smoking and secondhand smoke. Exposure to cigarette smoke or irritating chemicals will make bronchitis worse. If you smoke and you need help quitting, ask your health care provider. Quitting smoking will help your lungs heal faster.  Use an inhaler, cool mist  vaporizer, or humidifier as told by your health care provider.  Keep all follow-up visits as told by your health care provider. This is important. How is this prevented? To lower your risk of getting this condition again:  Wash your hands often with soap and water. If soap and water are not available, use hand sanitizer.  Avoid contact with people who have cold symptoms.  Try not to touch your hands to your mouth, nose, or eyes.  Make sure to get the flu shot every year.  Contact a health care provider if:  Your symptoms do not improve in 2 weeks of treatment. Get help right away if:  You cough up blood.  You have chest pain.  You have severe shortness of breath.  You become dehydrated.  You faint or keep feeling like you are going to faint.  You keep vomiting.  You have a severe headache.  Your fever or chills gets worse. This information  is not intended to replace advice given to you by your health care provider. Make sure you discuss any questions you have with your health care provider. Document Released: 06/25/2004 Document Revised: 12/11/2015 Document Reviewed: 11/06/2015 Elsevier Interactive Patient Education  [employer].

## 2017-07-19 ENCOUNTER — Other Ambulatory Visit: Payer: Self-pay | Admitting: Family Medicine

## 2017-07-23 ENCOUNTER — Ambulatory Visit (INDEPENDENT_AMBULATORY_CARE_PROVIDER_SITE_OTHER): Payer: BLUE CROSS/BLUE SHIELD | Admitting: Physician Assistant

## 2017-07-23 ENCOUNTER — Encounter: Payer: Self-pay | Admitting: Physician Assistant

## 2017-07-23 ENCOUNTER — Other Ambulatory Visit: Payer: Self-pay

## 2017-07-23 VITALS — BP 126/78 | HR 85 | Temp 98.5°F | Resp 16 | Ht 69.29 in | Wt 172.0 lb

## 2017-07-23 DIAGNOSIS — J320 Chronic maxillary sinusitis: Secondary | ICD-10-CM

## 2017-07-23 DIAGNOSIS — R634 Abnormal weight loss: Secondary | ICD-10-CM | POA: Diagnosis not present

## 2017-07-23 LAB — POCT URINALYSIS DIP (MANUAL ENTRY)
Bilirubin, UA: NEGATIVE
Blood, UA: NEGATIVE
Glucose, UA: NEGATIVE mg/dL
Ketones, POC UA: NEGATIVE mg/dL
Leukocytes, UA: NEGATIVE
Nitrite, UA: NEGATIVE
Protein Ur, POC: 30 mg/dL — AB
Spec Grav, UA: 1.015 (ref 1.010–1.025)
Urobilinogen, UA: 0.2 E.U./dL
pH, UA: 6.5 (ref 5.0–8.0)

## 2017-07-23 MED ORDER — AMOXICILLIN-POT CLAVULANATE 875-125 MG PO TABS: 1.0000 | ORAL_TABLET | Freq: Two times a day (BID) | ORAL | 0 refills | Status: DC

## 2017-07-23 MED ORDER — PREDNISONE 20 MG PO TABS: ORAL_TABLET | ORAL | 0 refills | Status: DC

## 2017-07-23 MED ORDER — TOPIRAMATE ER 25 MG PO CAP24: 50.0000 mg | ORAL_CAPSULE | Freq: Every day | ORAL | 0 refills | Status: DC

## 2017-07-23 MED ORDER — TOPIRAMATE ER 25 MG PO CAP24: ORAL_CAPSULE | ORAL | 0 refills | Status: DC

## 2017-07-23 NOTE — Patient Instructions (Addendum)
Lets adjust the topamax as this seems to be the cause of your symptoms.   Pick up the med sig that says 75 mg for one week then 50 mg for 1 week.  See you back in 7 daysl    IF you received an x-ray today, you will receive an invoice from [facility]. Please contact [facility] at [phone] with questions or concerns regarding your invoice.   IF you received labwork today, you will receive an invoice from [facility]. Please contact [facility] at [phone] with questions or concerns regarding your invoice.   Our billing staff will not be able to assist you with questions regarding bills from these companies.  You will be contacted with the lab results as soon as they are available. The fastest way to get your results is to activate your My Chart account. Instructions are located on the last page of this paperwork. If you have not heard from [place] regarding the results in 2 weeks, please contact this office.

## 2017-07-23 NOTE — Progress Notes (Signed)
07/23/2017 12:55 PM   DOB: 1971-01-05 / MRN: [id number]  SUBJECTIVE:  Benjamin [name] is a 47 y.o. male presenting for weight loss and sinusitis.  Symptoms have been ongoing now for about 1 month to 1.5 months.  He is trying doxycycline as well as azithromycin.  None of these have helped.  He has recently been placed on 100 mg of Topamax daily.  This was started in late November early December.   He is allergic to compazine [prochlorperazine edisylate] and pepto-bismol [bismuth subsalicylate].   He  has a past medical history of Acute DVT of left tibial vein ([place]) (04/24/2017), Adenomatous colon polyp (10/04/15), ALLERGIC RHINITIS (07/11/2008), Allergy, Angiolipoma (2007), Anxiety, BACK PAIN, CHRONIC, INTERMITTENT (10/17/2008), Barrett's esophagus determined by biopsy (04/12/14), BENIGN PROSTATIC HYPERTROPHY, WITH URINARY OBSTRUCTION (07/21/2007), BURSITIS, HIP (09/30/2009), CAP (community acquired pneumonia) (06/2014), Cataracts, bilateral (2016), Chronic headaches, Chronic renal insufficiency, stage II (mild), Diverticulitis (2016), Frequent headaches, Gastric polyp (2006), GERD without esophagitis, Herpes labialis, Hiatal hernia, Hyperlipidemia, HYPERTENSION (01/05/2007), OSA (obstructive sleep apnea) (04/2017), and PSORIASIS (01/05/2007).    He  reports that  has never smoked. he has never used smokeless tobacco. He reports that he drinks about 1.2 oz of alcohol per week. He reports that he does not use drugs. He  has no sexual activity history on file. The patient  has a past surgical history that includes Lipoma excision; Esophagogastroduodenoscopy (04/12/14); and Colonoscopy w/ polypectomy (10/04/15).  His family history includes CAD in his father and paternal grandmother; Cancer in his maternal grandfather; Colon cancer in his unknown relative; Diabetes in his paternal grandmother; Heart disease in his father; Hyperlipidemia in his brother and father; Hypertension in his brother, father, and mother; Lung  cancer in his maternal grandmother; Prostate cancer in his unknown relative.  Review of Systems  HENT: Positive for congestion. Negative for sore throat.   Gastrointestinal: Negative for abdominal pain, blood in stool, constipation, diarrhea, heartburn, melena, nausea and vomiting.  Genitourinary: Negative for dysuria, flank pain, frequency, hematuria and urgency.  Musculoskeletal: Negative for myalgias.    The problem list and medications were reviewed and updated by myself where necessary and exist elsewhere in the encounter.   OBJECTIVE:  BP 126/78   Pulse 85   Temp 98.5 F (36.9 C) (Oral)   Resp 16   Ht 5' 9.29" (1.76 m)   Wt 172 lb (78 kg)   SpO2 100%   BMI 25.19 kg/m   Wt Readings from Last 3 Encounters:  07/23/17 172 lb (78 kg)  07/15/17 178 lb (80.7 kg)  07/06/17 179 lb 12.8 oz (81.6 kg)   Lab Results  Component Value Date   WBC 9.1 07/05/2017   HGB 14.9 05/24/2017   HCT 46.2 07/05/2017   MCV 87.7 07/05/2017   PLT 226 07/05/2017    Lab Results  Component Value Date   CREATININE 1.39 (H) 07/05/2017   BUN 19 07/05/2017   NA 138 07/05/2017   K 4.0 07/05/2017   CL 106 07/05/2017   CO2 23 07/05/2017    Lab Results  Component Value Date   ALT 28 07/05/2017   AST 22 07/05/2017   ALKPHOS 81 07/05/2017   BILITOT 0.8 07/05/2017    Lab Results  Component Value Date   TSH 1.38 04/26/2017    Lab Results  Component Value Date   CHOL 195 10/23/2016   HDL 37.20 (L) 10/23/2016   LDLCALC 117 (H) 12/12/2013   LDLDIRECT 94.0 10/23/2016   TRIG 282.0 (H) 10/23/2016  CHOLHDL 5 10/23/2016      Physical Exam  Constitutional: He appears well-developed. He is active and cooperative.  Non-toxic appearance.  Cardiovascular: Normal rate, regular rhythm, S1 normal, S2 normal, normal heart sounds, intact distal pulses and normal pulses. Exam reveals no gallop and no friction rub.  No murmur heard. Pulmonary/Chest: Effort normal. No stridor. No tachypnea. No  respiratory distress. He has no wheezes. He has no rales.  Abdominal: He exhibits no distension.  Musculoskeletal: He exhibits no edema.  Neurological: He is alert.  Skin: Skin is warm and dry. He is not diaphoretic. No pallor.  Vitals reviewed.   Results for orders placed or performed in visit on 07/23/17 (from the past 72 hour(s))  POCT urinalysis dipstick     Status: Abnormal   Collection Time: 07/23/17 11:35 AM  Result Value Ref Range   Color, UA yellow yellow   Clarity, UA clear clear   Glucose, UA negative negative mg/dL   Bilirubin, UA negative negative   Ketones, POC UA negative negative mg/dL   Spec Grav, UA 1.015 1.010 - 1.025   Blood, UA negative negative   pH, UA 6.5 5.0 - 8.0   Protein Ur, POC =30 (A) negative mg/dL   Urobilinogen, UA 0.2 0.2 or 1.0 E.U./dL   Nitrite, UA Negative Negative   Leukocytes, UA Negative Negative    No results found.  ASSESSMENT AND PLAN:  Benjamin [name] was seen today for bronchitis.  Diagnoses and all orders for this visit:  Chronic maxillary sinusitis: Symptoms pushing through doxycycline as well as azithromycin now.  He is also had weight loss and changes in his sensation of taste.  He is lost almost 30 pounds.  This is highly abnormal for a bacterial sinus infection.  I reviewed his medications with him and he has been taking Topamax at 100 mg daily now for about a month.  I reviewed the side effects associated with this medication which obviously include weight loss and also includes the side effect of sinusitis which appears to be common per epocrates.  Unfortunately it is working well for his headaches.  Instead of treating a bacteria I think is wise we tried to pull back the offending agent to see if this makes a difference in his symptoms.  He will go to 75 mg for 1 week and then to 50 mg the next week.  Hopefully we will see him improve with this plan. -     CBC -     Sedimentation Rate -     POCT urinalysis dipstick  Weight loss -      Renal Function Panel -     TSH -     Topiramate ER (TROKENDI XR) 25 MG CP24; Take 75 mg by mouth daily for 7 days, THEN 50 mg daily for 7 days.  Other orders -     Discontinue: amoxicillin-clavulanate (AUGMENTIN) 875-125 MG tablet; Take 1 tablet by mouth 2 (two) times daily. -     Discontinue: predniSONE (DELTASONE) 20 MG tablet; Take 3 in the morning for 3 days, then 2 in the morning for 3 days, and then 1 in the morning for 3 days. -     Discontinue: Topiramate ER (TROKENDI XR) 25 MG CP24; Take 50-75 mg by mouth daily. Take 75 mg tomorrow and then go to 50 mg.  Stay on the 50 and come back in 1 week.    The patient is advised to call or return to clinic if he does not  see an improvement in symptoms, or to seek the care of the closest emergency department if he worsens with the above plan.   Philis Fendt, MHS, PA-C [facility] 07/23/2017 12:55 PM

## 2017-07-24 LAB — CBC
Hematocrit: 42.2 % (ref 37.5–51.0)
Hemoglobin: 14.4 g/dL (ref 13.0–17.7)
MCH: 29.1 pg (ref 26.6–33.0)
MCHC: 34.1 g/dL (ref 31.5–35.7)
MCV: 85 fL (ref 79–97)
Platelets: 356 10*3/uL (ref 150–379)
RBC: 4.94 x10E6/uL (ref 4.14–5.80)
RDW: 13.7 % (ref 12.3–15.4)
WBC: 4.1 10*3/uL (ref 3.4–10.8)

## 2017-07-24 LAB — RENAL FUNCTION PANEL
Albumin: 4.4 g/dL (ref 3.5–5.5)
BUN/Creatinine Ratio: 14 (ref 9–20)
BUN: 19 mg/dL (ref 6–24)
CO2: 22 mmol/L (ref 20–29)
Calcium: 9.7 mg/dL (ref 8.7–10.2)
Chloride: 107 mmol/L — ABNORMAL HIGH (ref 96–106)
Creatinine, Ser: 1.35 mg/dL — ABNORMAL HIGH (ref 0.76–1.27)
GFR calc Af Amer: 72 mL/min/{1.73_m2} (ref >59)
GFR calc non Af Amer: 62 mL/min/{1.73_m2} (ref >59)
Glucose: 95 mg/dL (ref 65–99)
Phosphorus: 3.4 mg/dL (ref 2.5–4.5)
Potassium: 3.9 mmol/L (ref 3.5–5.2)
Sodium: 143 mmol/L (ref 134–144)

## 2017-07-24 LAB — TSH: TSH: 1.18 u[IU]/mL (ref 0.450–4.500)

## 2017-07-24 LAB — SEDIMENTATION RATE: Sed Rate: 6 mm/hr (ref 0–15)

## 2017-08-02 ENCOUNTER — Encounter: Payer: Self-pay | Admitting: Neurology

## 2017-08-16 ENCOUNTER — Other Ambulatory Visit: Payer: Self-pay

## 2017-08-16 MED ORDER — RIVAROXABAN 20 MG PO TABS: 20.0000 mg | ORAL_TABLET | Freq: Every day | ORAL | 2 refills | Status: DC

## 2017-08-23 ENCOUNTER — Other Ambulatory Visit: Payer: Self-pay | Admitting: Physician Assistant

## 2017-08-30 ENCOUNTER — Other Ambulatory Visit: Payer: Self-pay | Admitting: Family Medicine

## 2017-09-03 ENCOUNTER — Ambulatory Visit (INDEPENDENT_AMBULATORY_CARE_PROVIDER_SITE_OTHER): Payer: BLUE CROSS/BLUE SHIELD | Admitting: Physician Assistant

## 2017-09-03 ENCOUNTER — Encounter: Payer: Self-pay | Admitting: Physician Assistant

## 2017-09-03 VITALS — BP 113/73 | HR 81 | Temp 97.9°F | Ht 69.0 in | Wt 180.2 lb

## 2017-09-03 DIAGNOSIS — J019 Acute sinusitis, unspecified: Secondary | ICD-10-CM | POA: Diagnosis not present

## 2017-09-03 DIAGNOSIS — B9689 Other specified bacterial agents as the cause of diseases classified elsewhere: Secondary | ICD-10-CM

## 2017-09-03 MED ORDER — AMOXICILLIN-POT CLAVULANATE 875-125 MG PO TABS: 1.0000 | ORAL_TABLET | Freq: Two times a day (BID) | ORAL | 0 refills | Status: DC

## 2017-09-03 NOTE — Progress Notes (Signed)
09/03/2017 2:58 PM   DOB: 04-20-1971 / MRN: [id number]  SUBJECTIVE:  Benjamin [name] is a 47 y.o. male presenting for left sided maxillary pain, teeth pain, and frontal sinus pain.  Symptoms worsening over the last three days.  He tried doxy without relief.  He is getting worse. Was having chronic sinusitis pushing through multiple abx therapies in the setting of increasing Topamax dosing for chronic migraine.  He dropped the dose back to 50 mg per my instructions and this resoled.   He is allergic to compazine [prochlorperazine edisylate] and pepto-bismol [bismuth subsalicylate].   He  has a past medical history of Acute DVT of left tibial vein ([place]) (04/24/2017), Adenomatous colon polyp (10/04/15), ALLERGIC RHINITIS (07/11/2008), Allergy, Angiolipoma (2007), Anxiety, BACK PAIN, CHRONIC, INTERMITTENT (10/17/2008), Barrett's esophagus determined by biopsy (04/12/14), BENIGN PROSTATIC HYPERTROPHY, WITH URINARY OBSTRUCTION (07/21/2007), BURSITIS, HIP (09/30/2009), CAP (community acquired pneumonia) (06/2014), Cataracts, bilateral (2016), Chronic headaches, Chronic renal insufficiency, stage II (mild), Diverticulitis (2016), Frequent headaches, Gastric polyp (2006), GERD without esophagitis, Herpes labialis, Hiatal hernia, Hyperlipidemia, HYPERTENSION (01/05/2007), OSA (obstructive sleep apnea) (04/2017), and PSORIASIS (01/05/2007).    He  reports that he has never smoked. He has never used smokeless tobacco. He reports that he drinks about 1.2 oz of alcohol per week. He reports that he does not use drugs. He  has no sexual activity history on file. The patient  has a past surgical history that includes Lipoma excision; Esophagogastroduodenoscopy (04/12/14); and Colonoscopy w/ polypectomy (10/04/15).  His family history includes CAD in his father and paternal grandmother; Cancer in his maternal grandfather; Colon cancer in his unknown relative; Diabetes in his paternal grandmother; Heart disease in his father;  Hyperlipidemia in his brother and father; Hypertension in his brother, father, and mother; Lung cancer in his maternal grandmother; Prostate cancer in his unknown relative.  Review of Systems  Constitutional: Negative for fever and weight loss.  HENT: Positive for congestion and sinus pain. Negative for sore throat.   Respiratory: Negative for cough.   Cardiovascular: Negative for chest pain.  Gastrointestinal: Negative for nausea.    The problem list and medications were reviewed and updated by myself where necessary and exist elsewhere in the encounter.   OBJECTIVE:  BP 113/73 (BP Location: Left Arm, Patient Position: Sitting, Cuff Size: Normal)   Pulse 81   Temp 97.9 F (36.6 C) (Oral)   Ht 5\' 9"  (1.753 m)   Wt 180 lb 3.2 oz (81.7 kg)   SpO2 99%   BMI 26.61 kg/m   Physical Exam  Constitutional: He appears well-developed. He is active and cooperative.  Non-toxic appearance.  HENT:  Head:    Right Ear: Hearing, tympanic membrane, external ear and ear canal normal.  Left Ear: Hearing, tympanic membrane, external ear and ear canal normal.  Nose: Nose normal. Right sinus exhibits no maxillary sinus tenderness and no frontal sinus tenderness. Left sinus exhibits no maxillary sinus tenderness and no frontal sinus tenderness.  Mouth/Throat: Uvula is midline, oropharynx is clear and moist and mucous membranes are normal. No oropharyngeal exudate, posterior oropharyngeal edema or tonsillar abscesses.  Eyes: Pupils are equal, round, and reactive to light. Conjunctivae are normal.  Cardiovascular: Normal rate.  Pulmonary/Chest: Effort normal. No tachypnea.  Lymphadenopathy:       Head (right side): No submandibular and no tonsillar adenopathy present.       Head (left side): No submandibular and no tonsillar adenopathy present.    He has no cervical adenopathy.  Neurological: He  is alert.  Skin: Skin is warm and dry. He is not diaphoretic. No pallor.  Vitals reviewed.   No  results found for this or any previous visit (from the past 72 hour(s)).  No results found.  ASSESSMENT AND PLAN:  [name] was seen today for sinus problem.  Diagnoses and all orders for this visit:  Acute bacterial sinusitis:  -     amoxicillin-clavulanate (AUGMENTIN) 875-125 MG tablet; Take 1 tablet by mouth 2 (two) times daily.    The patient is advised to call or return to clinic if he does not see an improvement in symptoms, or to seek the care of the closest emergency department if he worsens with the above plan.   Philis Fendt, MHS, PA-C [facility] 09/03/2017 2:58 PM

## 2017-09-03 NOTE — Patient Instructions (Addendum)
Augmentin waiting at the pharmacy.     IF you received an x-ray today, you will receive an invoice from [facility]. Please contact [facility] at [phone] with questions or concerns regarding your invoice.   IF you received labwork today, you will receive an invoice from [facility]. Please contact [facility] at [phone] with questions or concerns regarding your invoice.   Our billing staff will not be able to assist you with questions regarding bills from these companies.  You will be contacted with the lab results as soon as they are available. The fastest way to get your results is to activate your My Chart account. Instructions are located on the last page of this paperwork. If you have not heard from [place] regarding the results in 2 weeks, please contact this office.

## 2017-09-13 ENCOUNTER — Encounter: Payer: Self-pay | Admitting: Physician Assistant

## 2017-09-13 ENCOUNTER — Ambulatory Visit (INDEPENDENT_AMBULATORY_CARE_PROVIDER_SITE_OTHER): Payer: BLUE CROSS/BLUE SHIELD | Admitting: Physician Assistant

## 2017-09-13 ENCOUNTER — Other Ambulatory Visit: Payer: Self-pay

## 2017-09-13 VITALS — BP 130/82 | HR 120 | Temp 99.0°F | Resp 16 | Ht 69.69 in | Wt 170.0 lb

## 2017-09-13 DIAGNOSIS — K529 Noninfective gastroenteritis and colitis, unspecified: Secondary | ICD-10-CM | POA: Diagnosis not present

## 2017-09-13 MED ORDER — LOPERAMIDE HCL 2 MG PO TABS: 2.0000 mg | ORAL_TABLET | Freq: Four times a day (QID) | ORAL | 0 refills | Status: DC | PRN

## 2017-09-13 MED ORDER — ONDANSETRON HCL 4 MG PO TABS: 4.0000 mg | ORAL_TABLET | Freq: Three times a day (TID) | ORAL | 0 refills | Status: DC | PRN

## 2017-09-13 MED ORDER — TRAMADOL HCL 50 MG PO TABS: 50.0000 mg | ORAL_TABLET | Freq: Three times a day (TID) | ORAL | 0 refills | Status: DC | PRN

## 2017-09-13 MED ORDER — ONDANSETRON HCL 4 MG/2ML IJ SOLN
4.0000 mg | Freq: Once | INTRAMUSCULAR | Status: AC
  Administered 2017-09-13: 4 mg via INTRAVENOUS

## 2017-09-13 MED ORDER — ONDANSETRON 4 MG PO TBDP: 4.0000 mg | ORAL_TABLET | Freq: Once | ORAL | Status: DC

## 2017-09-13 MED ORDER — SODIUM CHLORIDE 0.9 % IV BOLUS
1000.0000 mL | Freq: Once | INTRAVENOUS | Status: AC
  Administered 2017-09-13: 1000 mL via INTRAVENOUS

## 2017-09-13 NOTE — Progress Notes (Signed)
09/13/2017 3:26 PM   DOB: 06/13/1970 / MRN: [id number]  SUBJECTIVE:  Benjamin [name] is a 47 y.o. male presenting for nausea, vomiting, diarrhea.  Symptoms started last night and suddenly.  Patient denies any bloody stools.  States "I have never been this sick."  Denies fever.  He thinks this is something that he ate.  Tells me he feels dizzy when he stands up.  He is allergic to compazine [prochlorperazine edisylate] and pepto-bismol [bismuth subsalicylate].   He  has a past medical history of Acute DVT of left tibial vein ([place]) (04/24/2017), Adenomatous colon polyp (10/04/15), ALLERGIC RHINITIS (07/11/2008), Allergy, Angiolipoma (2007), Anxiety, BACK PAIN, CHRONIC, INTERMITTENT (10/17/2008), Barrett's esophagus determined by biopsy (04/12/14), BENIGN PROSTATIC HYPERTROPHY, WITH URINARY OBSTRUCTION (07/21/2007), BURSITIS, HIP (09/30/2009), CAP (community acquired pneumonia) (06/2014), Cataracts, bilateral (2016), Chronic headaches, Chronic renal insufficiency, stage II (mild), Diverticulitis (2016), Frequent headaches, Gastric polyp (2006), GERD without esophagitis, Herpes labialis, Hiatal hernia, Hyperlipidemia, HYPERTENSION (01/05/2007), OSA (obstructive sleep apnea) (04/2017), and PSORIASIS (01/05/2007).    He  reports that he has never smoked. He has never used smokeless tobacco. He reports that he drinks about 1.2 oz of alcohol per week. He reports that he does not use drugs. He  has no sexual activity history on file. The patient  has a past surgical history that includes Lipoma excision; Esophagogastroduodenoscopy (04/12/14); and Colonoscopy w/ polypectomy (10/04/15).  His family history includes CAD in his father and paternal grandmother; Cancer in his maternal grandfather; Colon cancer in his unknown relative; Diabetes in his paternal grandmother; Heart disease in his father; Hyperlipidemia in his brother and father; Hypertension in his brother, father, and mother; Lung cancer in his maternal  grandmother; Prostate cancer in his unknown relative.  Review of Systems  Constitutional: Negative for chills, diaphoresis and fever.  Respiratory: Negative for shortness of breath.   Cardiovascular: Negative for chest pain, orthopnea and leg swelling.  Gastrointestinal: Positive for diarrhea, nausea and vomiting. Negative for abdominal pain, blood in stool and constipation.  Skin: Negative for rash.  Neurological: Negative for dizziness.    The problem list and medications were reviewed and updated by myself where necessary and exist elsewhere in the encounter.   OBJECTIVE:  BP 130/82   Pulse (!) 120   Temp 99 F (37.2 C) (Oral)   Resp 16   Ht 5' 9.69" (1.77 m)   Wt 170 lb (77.1 kg)   SpO2 99%   BMI 24.61 kg/m   Orthostatic VS for the past 24 hrs:  BP- Lying Pulse- Lying BP- Sitting Pulse- Sitting BP- Standing at 0 minutes Pulse- Standing at 0 minutes  09/13/17 1410 (!) 135/91 99 138/90 115 (!) 134/95 122      Wt Readings from Last 3 Encounters:  09/13/17 170 lb (77.1 kg)  09/03/17 180 lb 3.2 oz (81.7 kg)  07/23/17 172 lb (78 kg)     Physical Exam  Constitutional: He is oriented to person, place, and time. He appears well-developed. He does not appear ill.  Eyes: Pupils are equal, round, and reactive to light. Conjunctivae and EOM are normal.  Cardiovascular: Normal rate, regular rhythm, S1 normal, S2 normal, normal heart sounds, intact distal pulses and normal pulses. Exam reveals no gallop and no friction rub.  No murmur heard. Pulmonary/Chest: Effort normal. He has no rales.  Abdominal: He exhibits no distension.  Musculoskeletal: Normal range of motion. He exhibits no edema.  Neurological: He is alert and oriented to person, place, and time. No cranial  nerve deficit. Coordination normal.  Skin: Skin is warm and dry. He is not diaphoretic.  Psychiatric: He has a normal mood and affect.  Nursing note and vitals reviewed.   No results found for this or any  previous visit (from the past 72 hour(s)).  No results found.  ASSESSMENT AND PLAN:  Kalim was seen today for diarrhea and emesis.  Diagnoses and all orders for this visit:  Gastroenteritis -     Orthostatic vital signs -     Insert peripheral IV -     Discontinue: ondansetron (ZOFRAN-ODT) disintegrating tablet 4 mg -     sodium chloride 0.9 % bolus 1,000 mL -     ondansetron (ZOFRAN) injection 4 mg -     loperamide (IMODIUM A-D) 2 MG tablet; Take 1 tablet (2 mg total) by mouth 4 (four) times daily as needed for diarrhea or loose stools. -     ondansetron (ZOFRAN) 4 MG tablet; Take 1 tablet (4 mg total) by mouth every 8 (eight) hours as needed for nausea or vomiting. -     traMADol (ULTRAM) 50 MG tablet; Take 1 tablet (50 mg total) by mouth every 8 (eight) hours as needed.  Other orders -     Discontinue: traMADol (ULTRAM) 50 MG tablet; Take 1 tablet (50 mg total) by mouth every 8 (eight) hours as needed.    The patient is advised to call or return to clinic if he does not see an improvement in symptoms, or to seek the care of the closest emergency department if he worsens with the above plan.   Philis Fendt, MHS, PA-C [facility] 09/13/2017 3:26 PM

## 2017-09-13 NOTE — Patient Instructions (Addendum)
  Take loperamide 2 mg every 6 hours to stop the diarrhea. Take Zofran 4 mg every 4-6 hours for nausea.  Try to get some rest.  Come back tomorrow or Wednesday if you symptoms are getting worse.  Come back on Friday if you are not getting better.    IF you received an x-ray today, you will receive an invoice from [facility]. Please contact [facility] at [phone] with questions or concerns regarding your invoice.   IF you received labwork today, you will receive an invoice from [facility]. Please contact [facility] at [phone] with questions or concerns regarding your invoice.   Our billing staff will not be able to assist you with questions regarding bills from these companies.  You will be contacted with the lab results as soon as they are available. The fastest way to get your results is to activate your My Chart account. Instructions are located on the last page of this paperwork. If you have not heard from [place] regarding the results in 2 weeks, please contact this office.

## 2017-10-04 ENCOUNTER — Inpatient Hospital Stay: Payer: BLUE CROSS/BLUE SHIELD | Attending: Family | Admitting: Family

## 2017-10-04 ENCOUNTER — Encounter: Payer: Self-pay | Admitting: Family

## 2017-10-04 ENCOUNTER — Inpatient Hospital Stay: Payer: BLUE CROSS/BLUE SHIELD

## 2017-10-04 ENCOUNTER — Other Ambulatory Visit: Payer: Self-pay

## 2017-10-04 VITALS — BP 125/76 | HR 80 | Temp 98.2°F | Resp 16 | Wt 179.0 lb

## 2017-10-04 DIAGNOSIS — Z7901 Long term (current) use of anticoagulants: Secondary | ICD-10-CM | POA: Insufficient documentation

## 2017-10-04 DIAGNOSIS — D6851 Activated protein C resistance: Secondary | ICD-10-CM

## 2017-10-04 DIAGNOSIS — Z79899 Other long term (current) drug therapy: Secondary | ICD-10-CM | POA: Diagnosis not present

## 2017-10-04 DIAGNOSIS — D6862 Lupus anticoagulant syndrome: Secondary | ICD-10-CM | POA: Insufficient documentation

## 2017-10-04 DIAGNOSIS — I82442 Acute embolism and thrombosis of left tibial vein: Secondary | ICD-10-CM | POA: Insufficient documentation

## 2017-10-04 LAB — CMP (CANCER CENTER ONLY)
ALT: 19 U/L (ref 0–55)
AST: 22 U/L (ref 5–34)
Albumin: 4.4 g/dL (ref 3.5–5.0)
Alkaline Phosphatase: 63 U/L (ref 40–150)
Anion gap: 8 (ref 3–11)
BUN: 16 mg/dL (ref 7–26)
CO2: 23 mmol/L (ref 22–29)
Calcium: 9.5 mg/dL (ref 8.4–10.4)
Chloride: 110 mmol/L — ABNORMAL HIGH (ref 98–109)
Creatinine: 1.38 mg/dL — ABNORMAL HIGH (ref 0.70–1.30)
GFR, Est AFR Am: >60 mL/min (ref >60)
GFR, Estimated: 59 mL/min — ABNORMAL LOW (ref >60)
Glucose, Bld: 110 mg/dL (ref 70–140)
Potassium: 3.5 mmol/L (ref 3.5–5.1)
Sodium: 141 mmol/L (ref 136–145)
Total Bilirubin: 0.5 mg/dL (ref 0.2–1.2)
Total Protein: 7.7 g/dL (ref 6.4–8.3)

## 2017-10-04 LAB — CBC WITH DIFFERENTIAL (CANCER CENTER ONLY)
Basophils Absolute: 0 10*3/uL (ref 0.0–0.1)
Basophils Relative: 1 %
Eosinophils Absolute: 0.2 10*3/uL (ref 0.0–0.5)
Eosinophils Relative: 4 %
HCT: 42.4 % (ref 38.7–49.9)
Hemoglobin: 14.8 g/dL (ref 13.0–17.1)
Lymphocytes Relative: 44 %
Lymphs Abs: 2.5 10*3/uL (ref 0.9–3.3)
MCH: 30.8 pg (ref 28.0–33.4)
MCHC: 34.9 g/dL (ref 32.0–35.9)
MCV: 88.1 fL (ref 82.0–98.0)
Monocytes Absolute: 0.4 10*3/uL (ref 0.1–0.9)
Monocytes Relative: 6 %
Neutro Abs: 2.6 10*3/uL (ref 1.5–6.5)
Neutrophils Relative %: 45 %
Platelet Count: 324 10*3/uL (ref 145–400)
RBC: 4.81 MIL/uL (ref 4.20–5.70)
RDW: 13 % (ref 11.1–15.7)
WBC Count: 5.7 10*3/uL (ref 4.0–10.0)

## 2017-10-04 LAB — D-DIMER, QUANTITATIVE (NOT AT ARMC): D-Dimer, Quant: <0.27 ug/mL-FEU (ref 0.00–0.50)

## 2017-10-04 NOTE — Progress Notes (Signed)
Hematology and Oncology Follow Up Visit  Benjamin [name] [id number] [birth year] 47 y.o. 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 He is doing well and has no complaints at this time.  He has been doing a lot of travelling in the car for work. He has made sure to stop and get out to walk around every 1-2 hours and is staying well hydrated. He is wearing his compression stockings for support. He will have some tenderness in his left calve when he is travelling and on his feet for an extended period of time.  He is doing well on Xarelto and verbalized that he is taking his medication 20 mg PO daily as prescribed.  No episodes of bleeding, no bruising or petechiae.  No fever, chills, n/v, cough, rash, dizziness, SOB, chest pain, palpitations, abdominal pain or changes in bowel or bladder habits.  No lymphadenopathy noted on exam. No swelling in her extremities. No c/o pain at this time.  He has positional numbness and tingling in his fingertips when playing golf.  He has maintained a good appetite and is staying well hydrated. His weight is stable.   ECOG Performance Status: 1 - Symptomatic but completely ambulatory  Medications:  Allergies as of 10/04/2017      Reactions   Compazine [prochlorperazine Edisylate] Other (See Comments)   Cervical dystonia and panic attack   Pepto-bismol [bismuth Subsalicylate] Swelling      Medication List        Accurate as of 10/04/17 10:40 AM. Always use your most recent med list.          acyclovir ointment 5 % Commonly known as:  ZOVIRAX Apply to affected areas every 3 hours as needed   ALPRAZolam 0.25 MG tablet Commonly known as:  XANAX Take 1-2 tabs 30-60  minutes before MRI may repeat if needed. Do not drive.   amoxicillin-clavulanate 875-125 MG tablet Commonly known as:  AUGMENTIN Take 1 tablet by mouth 2 (two) times daily.   bisoprolol-hydrochlorothiazide 5-6.25 MG tablet Commonly known as:  ZIAC TAKE 1 TABLET BY MOUTH DAILY.   buPROPion 300 MG 24 hr tablet Commonly known as:  WELLBUTRIN XL TAKE 1 TABLET BY MOUTH EVERY DAY   dextromethorphan-guaiFENesin 30-600 MG 12hr tablet Commonly known as:  MUCINEX DM Take 1 tablet by mouth 2 (two) times daily.   fluticasone 50 MCG/ACT nasal spray Commonly known as:  FLONASE Place 2 sprays into both nostrils daily.   hyoscyamine 0.125 MG SL tablet Commonly known as:  LEVSIN SL Place 1 tablet (0.125 mg total) under the tongue every 4 (four) hours as needed for cramping.   loperamide 2 MG tablet Commonly known as:  IMODIUM A-D Take 1 tablet (2 mg total) by mouth 4 (four) times daily as needed for diarrhea or loose stools.   ondansetron 4 MG tablet Commonly known as:  ZOFRAN Take 1 tablet (4 mg total) by mouth every 8 (eight) hours as needed for nausea or vomiting.   RABEprazole 20 MG tablet Commonly known as:  ACIPHEX Take 1 tablet (20 mg total) by mouth daily.   rivaroxaban 20 MG Tabs tablet Commonly known as:  XARELTO Take 1 tablet (20 mg total) by mouth daily with supper.  rizatriptan 10 MG disintegrating tablet Commonly known as:  MAXALT-MLT Take 1 tablet (10 mg total) as needed by mouth for migraine. May repeat in 2 hours if needed   rosuvastatin 40 MG tablet Commonly known as:  CRESTOR TAKE 1 TABLET BY MOUTH ONCE A WEEK.   tamsulosin 0.4 MG Caps capsule Commonly known as:  FLOMAX TAKE 1 CAPSULE BY MOUTH EVERY DAY   tiZANidine 4 MG tablet Commonly known as:  ZANAFLEX Take 1 tablet (4 mg total) every 6 (six) hours as needed by mouth for muscle spasms. Or for headaches.   Topiramate ER 25 MG Cp24 Commonly known as:  TROKENDI XR Take 75 mg by mouth daily for 7 days,  THEN 50 mg daily for 7 days. Start taking on:  07/23/2017   TROKENDI XR 25 MG Cp24 Generic drug:  Topiramate ER   traMADol 50 MG tablet Commonly known as:  ULTRAM Take 1 tablet (50 mg total) by mouth every 8 (eight) hours as needed.   valACYclovir 500 MG tablet Commonly known as:  VALTREX Take 1 tablet (500 mg total) by mouth daily.       Allergies:  Allergies  Allergen Reactions  . Compazine [Prochlorperazine Edisylate] Other (See Comments)    Cervical dystonia and panic attack  . Pepto-Bismol [Bismuth Subsalicylate] Swelling    Past Medical History, Surgical history, Social history, and Family History were reviewed and updated.  Review of Systems: All other 10 point review of systems is negative.   Physical Exam:  vitals were not taken for this visit.   Wt Readings from Last 3 Encounters:  09/13/17 170 lb (77.1 kg)  09/03/17 180 lb 3.2 oz (81.7 kg)  07/23/17 172 lb (78 kg)    Ocular: Sclerae unicteric, pupils equal, round and reactive to light Ear-nose-throat: Oropharynx clear, dentition fair Lymphatic: No cervical, supraclavicular or axillary adenopathy Lungs no rales or rhonchi, good excursion bilaterally Heart regular rate and rhythm, no murmur appreciated Abd soft, nontender, positive bowel sounds, no liver or spleen tip palpated on exam, no fluid wave  MSK no focal spinal tenderness, no joint edema Neuro: non-focal, well-oriented, appropriate affect Breasts: Deferred   Lab Results  Component Value Date   WBC 4.1 07/23/2017   HGB 14.4 07/23/2017   HCT 42.2 07/23/2017   MCV 85 07/23/2017   PLT 356 07/23/2017   Lab Results  Component Value Date   FERRITIN 111 05/24/2017   IRON 65 05/24/2017   TIBC 361 05/24/2017   UIBC 296 05/24/2017   IRONPCTSAT 18 (L) 05/24/2017   Lab Results  Component Value Date   RBC 4.94 07/23/2017   No results found for: KPAFRELGTCHN, LAMBDASER, KAPLAMBRATIO No results found for: IGGSERUM, IGA, IGMSERUM No results found  for: Odetta Pink, SPEI   Chemistry      Component Value Date/Time   NA 143 07/23/2017 1201   NA 141 05/24/2017 0845   K 3.9 07/23/2017 1201   K 3.6 05/24/2017 0845   CL 107 (H) 07/23/2017 1201   CL 106 05/24/2017 0845   CO2 22 07/23/2017 1201   CO2 26 05/24/2017 0845   BUN 19 07/23/2017 1201   BUN 17 05/24/2017 0845   CREATININE 1.35 (H) 07/23/2017 1201   CREATININE 1.4 (H) 05/24/2017 0845      Component Value Date/Time   CALCIUM 9.7 07/23/2017 1201   CALCIUM 9.0 05/24/2017 0845   ALKPHOS 81 07/05/2017 1129   ALKPHOS 66 05/24/2017 0845   AST 22  07/05/2017 1129   AST 30 05/24/2017 0845   ALT 28 07/05/2017 1129   ALT 35 05/24/2017 0845   BILITOT 0.8 07/05/2017 1129   BILITOT 1.00 05/24/2017 0845      Impression and Plan: Mr. 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  Lupus anticoagulant pending.  He continues to do well on Xarelto and no complaints at this time. He will continue his same regimen.  We will see him back in another 3 months for follow-up.  He will contact our office with any questions or concerns. We can certainly see him sooner if need be.   Laverna Peace, NP 5/6/201910:40 AM

## 2017-10-07 LAB — DRVVT CONFIRM: dRVVT Confirm: 1.3 ratio — ABNORMAL HIGH (ref 0.8–1.2)

## 2017-10-07 LAB — DRVVT MIX: dRVVT Mix: 47.4 s — ABNORMAL HIGH (ref 0.0–47.0)

## 2017-10-07 LAB — LUPUS ANTICOAGULANT PANEL
DRVVT: 59 s — ABNORMAL HIGH (ref 0.0–47.0)
PTT Lupus Anticoagulant: 37.3 s (ref 0.0–51.9)

## 2017-10-10 ENCOUNTER — Other Ambulatory Visit: Payer: Self-pay | Admitting: Family Medicine

## 2017-10-19 ENCOUNTER — Encounter: Payer: Self-pay | Admitting: Family Medicine

## 2017-11-13 ENCOUNTER — Other Ambulatory Visit: Payer: Self-pay | Admitting: Physician Assistant

## 2017-11-13 ENCOUNTER — Other Ambulatory Visit: Payer: Self-pay | Admitting: Family Medicine

## 2017-11-13 DIAGNOSIS — R634 Abnormal weight loss: Secondary | ICD-10-CM

## 2017-11-15 ENCOUNTER — Other Ambulatory Visit: Payer: Self-pay | Admitting: Physician Assistant

## 2017-11-15 ENCOUNTER — Encounter: Payer: Self-pay | Admitting: Physician Assistant

## 2017-11-15 DIAGNOSIS — R634 Abnormal weight loss: Secondary | ICD-10-CM

## 2017-11-15 MED ORDER — RIVAROXABAN 20 MG PO TABS
20.0000 mg | ORAL_TABLET | Freq: Every day | ORAL | 2 refills | Status: DC
Start: 2017-11-15 — End: 2018-02-11

## 2017-11-15 MED ORDER — TOPIRAMATE ER 25 MG PO CAP24: 50.0000 mg | ORAL_CAPSULE | Freq: Every day | ORAL | 3 refills | Status: DC

## 2017-11-18 ENCOUNTER — Ambulatory Visit (INDEPENDENT_AMBULATORY_CARE_PROVIDER_SITE_OTHER): Payer: BLUE CROSS/BLUE SHIELD | Admitting: Physician Assistant

## 2017-11-18 ENCOUNTER — Other Ambulatory Visit: Payer: Self-pay

## 2017-11-18 ENCOUNTER — Encounter: Payer: Self-pay | Admitting: Physician Assistant

## 2017-11-18 VITALS — BP 114/84 | HR 80 | Temp 98.3°F | Resp 16 | Ht 68.0 in | Wt 184.4 lb

## 2017-11-18 DIAGNOSIS — M546 Pain in thoracic spine: Secondary | ICD-10-CM | POA: Diagnosis not present

## 2017-11-18 DIAGNOSIS — M62838 Other muscle spasm: Secondary | ICD-10-CM | POA: Diagnosis not present

## 2017-11-18 MED ORDER — PREDNISONE 50 MG PO TABS: ORAL_TABLET | ORAL | 0 refills | Status: DC

## 2017-11-18 MED ORDER — TRAMADOL HCL 50 MG PO TABS: 50.0000 mg | ORAL_TABLET | Freq: Four times a day (QID) | ORAL | 0 refills | Status: DC | PRN

## 2017-11-18 NOTE — Patient Instructions (Addendum)
  Take 1000 mg tylenol every 8 hours for pain on top of prescription meds.  Fill the prednisone if a few days if your not getting enough relief with tramdol and flexeril.     IF you received an x-ray today, you will receive an invoice from [facility]. Please contact [facility] at [phone] with questions or concerns regarding your invoice.   IF you received labwork today, you will receive an invoice from [facility]. Please contact [facility] at [phone] with questions or concerns regarding your invoice.   Our billing staff will not be able to assist you with questions regarding bills from these companies.  You will be contacted with the lab results as soon as they are available. The fastest way to get your results is to activate your My Chart account. Instructions are located on the last page of this paperwork. If you have not heard from [place] regarding the results in 2 weeks, please contact this office.

## 2017-11-18 NOTE — Progress Notes (Signed)
11/19/2017 1:42 PM   DOB: 02-Apr-1971 / MRN: [id number]  SUBJECTIVE:  Benjamin [name] is a 47 y.o. male presenting for left-sided thoracic back pain. Symptoms present for about 12 hours.  The problem is worsening. He has tried Tylenol which helped mildly.  Patient has history of  factor V Leiden and has been instructed to avoid NSAIDs given he is taking Xarelto along with ASA.  He's had tramadol in the past and had no side effects.  He is prescribed a muscle relaxer by neurology not instructed him to continue this.  Is okay with trying some prednisone should his symptoms not improve with tramadol.  He is allergic to compazine [prochlorperazine edisylate] and pepto-bismol [bismuth subsalicylate].   He  has a past medical history of Acute DVT of left tibial vein ([place]) (04/24/2017), Adenomatous colon polyp (10/04/15), ALLERGIC RHINITIS (07/11/2008), Allergy, Angiolipoma (2007), Anxiety, BACK PAIN, CHRONIC, INTERMITTENT (10/17/2008), Barrett's esophagus determined by biopsy (04/12/14), BENIGN PROSTATIC HYPERTROPHY, WITH URINARY OBSTRUCTION (07/21/2007), BURSITIS, HIP (09/30/2009), CAP (community acquired pneumonia) (06/2014), Cataracts, bilateral (2016), Chronic headaches, Chronic renal insufficiency, stage II (mild), Diverticulitis (2016), Frequent headaches, Gastric polyp (2006), GERD without esophagitis, Herpes labialis, Hiatal hernia, Hyperlipidemia, HYPERTENSION (01/05/2007), OSA (obstructive sleep apnea) (04/2017), and PSORIASIS (01/05/2007).    He  reports that he has never smoked. He has never used smokeless tobacco. He reports that he drinks about 1.2 oz of alcohol per week. He reports that he does not use drugs. He  has no sexual activity history on file. The patient  has a past surgical history that includes Lipoma excision; Esophagogastroduodenoscopy (04/12/14); and Colonoscopy w/ polypectomy (10/04/15).  His family history includes CAD in his father and paternal grandmother; Cancer in his maternal  grandfather; Colon cancer in his unknown relative; Diabetes in his paternal grandmother; Heart disease in his father; Hyperlipidemia in his brother and father; Hypertension in his brother, father, and mother; Lung cancer in his maternal grandmother; Prostate cancer in his unknown relative.  Review of Systems  Constitutional: Negative for fever.  Respiratory: Negative for cough and shortness of breath.   Cardiovascular: Negative for chest pain.  Gastrointestinal: Negative for abdominal pain.  Musculoskeletal: Positive for back pain and myalgias. Negative for neck pain.  Neurological: Negative for dizziness.    The problem list and medications were reviewed and updated by myself where necessary and exist elsewhere in the encounter.   OBJECTIVE:  BP 114/84 (BP Location: Left Arm, Patient Position: Sitting, Cuff Size: Normal)   Pulse 80   Temp 98.3 F (36.8 C) (Oral)   Resp 16   Ht 5\' 8"  (1.727 m)   Wt 184 lb 6.4 oz (83.6 kg)   SpO2 99%   BMI 28.04 kg/m   Wt Readings from Last 3 Encounters:  11/18/17 184 lb 6.4 oz (83.6 kg)  10/04/17 179 lb (81.2 kg)  09/13/17 170 lb (77.1 kg)   Temp Readings from Last 3 Encounters:  11/18/17 98.3 F (36.8 C) (Oral)  10/04/17 98.2 F (36.8 C) (Oral)  09/13/17 99 F (37.2 C) (Oral)   BP Readings from Last 3 Encounters:  11/18/17 114/84  10/04/17 125/76  09/13/17 130/82   Pulse Readings from Last 3 Encounters:  11/18/17 80  10/04/17 80  09/13/17 (!) 120    Physical Exam  Constitutional: He is oriented to person, place, and time. He appears well-developed. He does not appear ill.  Eyes: Pupils are equal, round, and reactive to light. Conjunctivae and EOM are normal.  Cardiovascular: Normal rate,  regular rhythm, S1 normal, S2 normal, normal heart sounds, intact distal pulses and normal pulses. Exam reveals no gallop and no friction rub.  No murmur heard. Pulmonary/Chest: Effort normal and breath sounds normal. No stridor. No respiratory  distress. He has no wheezes. He has no rales.  Abdominal: He exhibits no distension.  Musculoskeletal: Normal range of motion. He exhibits no edema.       Arms: Neurological: He is alert and oriented to person, place, and time. No cranial nerve deficit. Coordination normal.  Skin: Skin is warm and dry. He is not diaphoretic.  Psychiatric: He has a normal mood and affect.  Nursing note and vitals reviewed.   No results found for: HGBA1C  Lab Results  Component Value Date   WBC 5.7 10/04/2017   HGB 14.8 10/04/2017   HCT 42.4 10/04/2017   MCV 88.1 10/04/2017   PLT 324 10/04/2017    Lab Results  Component Value Date   CREATININE 1.38 (H) 10/04/2017   BUN 16 10/04/2017   NA 141 10/04/2017   K 3.5 10/04/2017   CL 110 (H) 10/04/2017   CO2 23 10/04/2017    Lab Results  Component Value Date   ALT 19 10/04/2017   AST 22 10/04/2017   ALKPHOS 63 10/04/2017   BILITOT 0.5 10/04/2017    Lab Results  Component Value Date   TSH 1.180 07/23/2017    Lab Results  Component Value Date   CHOL 195 10/23/2016   HDL 37.20 (L) 10/23/2016   LDLCALC 117 (H) 12/12/2013   LDLDIRECT 94.0 10/23/2016   TRIG 282.0 (H) 10/23/2016   CHOLHDL 5 10/23/2016     ASSESSMENT AND PLAN:  Conlin was seen today for back pain.  Diagnoses and all orders for this visit:  Acute left-sided thoracic back pain: Patient modestly complex because he cannot take NSAIDs secondary to factor X inhibition secondary to factor V Leiden diagnosis.  He is done well on tramadol in the past for various complaints.  Place him back on that.  He does plan to travel soon and is concerned that he may not get adequate pain relief with tramadol.  I prescribed some prednisone on paper for him so they can try this for anti-inflammation if he gets no relief with tramadol.  Advised to be has any numbness or tingling in the fingers or arms it would be okay to go ahead and start the prednisone. -     traMADol (ULTRAM) 50 MG tablet;  Take 1-1.5 tablets (50-75 mg total) by mouth every 6 (six) hours as needed for severe pain. -     predniSONE (DELTASONE) 50 MG tablet; Take 1 tab daily in the morning.  Muscle spasm    The patient is advised to call or return to clinic if he does not see an improvement in symptoms, or to seek the care of the closest emergency department if he worsens with the above plan.   Philis Fendt, MHS, PA-C [facility] 11/19/2017 1:42 PM

## 2017-11-29 ENCOUNTER — Ambulatory Visit (INDEPENDENT_AMBULATORY_CARE_PROVIDER_SITE_OTHER): Payer: BLUE CROSS/BLUE SHIELD | Admitting: Podiatry

## 2017-11-29 ENCOUNTER — Encounter: Payer: Self-pay | Admitting: Podiatry

## 2017-11-29 VITALS — BP 123/80 | HR 73 | Temp 97.6°F | Resp 15 | Ht 69.0 in | Wt 182.0 lb

## 2017-11-29 DIAGNOSIS — B070 Plantar wart: Secondary | ICD-10-CM | POA: Diagnosis not present

## 2017-11-29 MED ORDER — FLUCONAZOLE 150 MG PO TABS: 150.0000 mg | ORAL_TABLET | ORAL | 1 refills | Status: DC

## 2017-11-29 NOTE — Progress Notes (Signed)
   Subjective:    Patient ID: Benjamin [name], male    DOB: 1970/09/25, 46 y.o.   MRN: [id number]  HPI    Review of Systems  All other systems reviewed and are negative.      Objective:   Physical Exam        Assessment & Plan:

## 2017-11-29 NOTE — Patient Instructions (Signed)
Remove dressing in 6 hours, wash with soap and water. Apply band-aid.

## 2017-11-29 NOTE — Progress Notes (Signed)
Subjective:  Patient ID: Benjamin [name], male    DOB: 1970-06-05,  MRN: [id number]  Chief Complaint  Patient presents with  . Skin Problem    Lesion: L sub-met 4 x couple weeks; 5/10 sharp pain Tx: none   47 y.o. male presents with the above complaint.  Reports lesion underneath the fourth metatarsal area for couple weeks.  5 out of 10 sharp pain denies prior treatments. Past Medical History:  Diagnosis Date  . Acute DVT of left tibial vein ([place]) 04/24/2017   Xarelto started (dx'd in emergency dept).  Dr. Marin Olp recommended full dose xarelto x 59mo with an additional year of maintenance dose xarelto (as of 05/2017 initial consult note).  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  Pt Factor V leiden heterozygote.  Stable as of 09/2017 hem f/u.  . Adenomatous colon polyp 10/04/15  . ALLERGIC RHINITIS 07/11/2008  . Allergy    SEASONAL  . 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 Anxiety   . BACK PAIN, CHRONIC, INTERMITTENT 10/17/2008  . Barrett's esophagus determined by biopsy 04/12/14  . BENIGN PROSTATIC HYPERTROPHY, WITH URINARY OBSTRUCTION 07/21/2007  . BURSITIS, HIP 09/30/2009  . CAP (community acquired pneumonia) 06/2014  . Cataracts, bilateral 2016   Not visually significant  . Chronic headaches    Migraine syndrome: worsening fall 2018, neuro eval 04/2017--topamax and maxalt started, MRI brain ordered (LP to be done if MRI neg--pt reporting fevers).  Pt referred for sleep study.  . Chronic renal insufficiency, stage II (mild)    GFR 60s-70s (suspect HTN and NSAID damage as etiology).  GFR dipped into 50s fall 2018 after lots of NSAID use for chronic daily HA's.  . Diverticulitis 2016   CT 01/2015  . Frequent headaches   . Gastric polyp 2006  . GERD without esophagitis   . Herpes labialis   . Hiatal hernia   . Hyperlipidemia   . HYPERTENSION 01/05/2007  . OSA (obstructive sleep apnea) 04/2017   Dr. ARexene Albertseval 04/2017-plan for sleep study.  .Marland Kitchen PSORIASIS 01/05/2007   Past Surgical History:  Procedure Laterality Date  . COLONOSCOPY W/ POLYPECTOMY  10/04/15   734mtubular adenoma + diverticulosis: recall 5 yrs (Dr. StFuller Plan . ESOPHAGOGASTRODUODENOSCOPY  04/12/14   Barrett's esophagus  . LIPOMA EXCISION     several, arm    Current Outpatient Medications:  .  acyclovir ointment (ZOVIRAX) 5 %, Apply to affected areas every 3 hours as needed, Disp: 30 g, Rfl: 6 .  ALPRAZolam (XANAX) 0.25 MG tablet, Take 1-2 tabs 30-60 minutes before MRI may repeat if needed. Do not drive., Disp: 5 tablet, Rfl: 0 .  bisoprolol-hydrochlorothiazide (ZIAC) 5-6.25 MG tablet, TAKE 1 TABLET BY MOUTH DAILY., Disp: 90 tablet, Rfl: 1 .  buPROPion (WELLBUTRIN XL) 300 MG 24 hr tablet, TAKE 1 TABLET BY MOUTH EVERY DAY, Disp: 90 tablet, Rfl: 1 .  fluticasone (FLONASE) 50 MCG/ACT nasal spray, Place 2 sprays into both nostrils daily., Disp: 16 g, Rfl: 6 .  hyoscyamine (LEVSIN SL) 0.125 MG SL tablet, Place 1 tablet (0.125 mg total) under the tongue every 4 (four) hours as needed for cramping., Disp: 120 tablet, Rfl: 11 .  ondansetron (ZOFRAN) 4 MG tablet, Take 1 tablet (4 mg total) by mouth every 8 (eight) hours as needed for nausea or vomiting., Disp: 20 tablet, Rfl: 0 .  predniSONE (DELTASONE) 50 MG tablet, Take 1 tab daily in the morning., Disp: 5 tablet, Rfl: 0 .  RABEprazole (ACIPHEX) 20 MG tablet, Take 1 tablet (20 mg total) by mouth daily., Disp: 90 tablet, Rfl: 3 .  rivaroxaban (XARELTO) 20 MG TABS tablet, Take 1 tablet (20 mg total) by mouth daily with supper., Disp: 30 tablet, Rfl: 2 .  rizatriptan (MAXALT-MLT) 10 MG disintegrating tablet, Take 1 tablet (10 mg total) as needed by mouth for migraine. May repeat in 2 hours if needed, Disp: 10 tablet, Rfl: 0 .  rosuvastatin (CRESTOR) 40 MG tablet, TAKE 1 TABLET BY MOUTH ONCE A WEEK., Disp: 4 tablet, Rfl: 12 .  tamsulosin (FLOMAX) 0.4 MG CAPS capsule, TAKE 1 CAPSULE BY MOUTH EVERY DAY, Disp: 90 capsule, Rfl: 0 .   tiZANidine (ZANAFLEX) 4 MG tablet, Take 1 tablet (4 mg total) every 6 (six) hours as needed by mouth for muscle spasms. Or for headaches., Disp: 60 tablet, Rfl: 6 .  Topiramate ER (TROKENDI XR) 25 MG CP24, Take 50 mg by mouth daily., Disp: 90 capsule, Rfl: 3 .  traMADol (ULTRAM) 50 MG tablet, Take 1-1.5 tablets (50-75 mg total) by mouth every 6 (six) hours as needed for severe pain., Disp: 45 tablet, Rfl: 0 .  valACYclovir (VALTREX) 500 MG tablet, Take 1 tablet (500 mg total) by mouth daily., Disp: 30 tablet, Rfl: 12 No current facility-administered medications for this visit.   Facility-Administered Medications Ordered in Other Visits:  .  gadopentetate dimeglumine (MAGNEVIST) injection 18 mL, 18 mL, Intravenous, Once PRN, Melvenia Beam, MD  Allergies  Allergen Reactions  . Compazine [Prochlorperazine Edisylate] Other (See Comments)    Cervical dystonia and panic attack  . Pepto-Bismol [Bismuth Subsalicylate] Swelling   Review of Systems: Negative except as noted in the HPI. Denies N/V/F/Ch. Objective:  There were no vitals filed for this visit. General AA&O x3. Normal mood and affect.  Vascular Dorsalis pedis and posterior tibial pulses  present 2+ bilaterally  Capillary refill normal to all digits. Pedal hair growth normal.  Neurologic Epicritic sensation grossly present.  Dermatologic No open lesions. Interspaces clear of maceration. Nails well groomed and normal in appearance. Hyperkeratotic lesion with punctate bleeding noted of the distal aspect of the fourth metatarsal plantarly not weightbearing surface  Orthopedic: MMT 5/5 in dorsiflexion, plantarflexion, inversion, and eversion. Normal joint ROM without pain or crepitus.   Assessment & Plan:  Patient was evaluated and treated and all questions answered.  Verruca, L Foot -Educated on the etiology. -Lesion destroyed as below. -Educated on post-op care.  Procedure: Destruction of Lesion Location: L 4th metatarsal  distal Anesthesia: none Instrumentation: 15 blade. Technique: Debridement of lesion to petechial bleeding. Aperture pad applied around lesion. Small amount of salinocaine applied to the base of the lesion. Dressing: Dry, sterile, compression dressing. Disposition: Patient tolerated procedure well. Advised to leave dressing on for 6-8 hours. Thereafter patient to wash the area with soap and water and applied band-aid. Off-loading pads dispensed. Patient to return in 2 weeks for follow-up.  -  No follow-ups on file.

## 2017-12-02 ENCOUNTER — Other Ambulatory Visit: Payer: Self-pay | Admitting: Family Medicine

## 2017-12-14 ENCOUNTER — Other Ambulatory Visit: Payer: Self-pay | Admitting: Family Medicine

## 2017-12-14 ENCOUNTER — Other Ambulatory Visit: Payer: Self-pay | Admitting: Physician Assistant

## 2017-12-14 DIAGNOSIS — E785 Hyperlipidemia, unspecified: Secondary | ICD-10-CM

## 2017-12-15 NOTE — Telephone Encounter (Signed)
Rosuvastatin calcium 40 MG tab refill request Last Refill: 09/21/17 #12 tabs, last ordered by Dr. Anitra Lauth on 12/30/16 Last OV: 11/18/17 PCP: Carlis Abbott, PA [facility] [address] [place]  Could not locate an office note where this medication has been addressed. Found only in [place] under medication list. Routing for further consideration

## 2017-12-23 ENCOUNTER — Ambulatory Visit: Payer: BLUE CROSS/BLUE SHIELD | Admitting: Podiatry

## 2017-12-23 NOTE — Telephone Encounter (Signed)
Refill request for rosuvastatin 40 mg denied.  This medication was not prescribed by Philis Fendt but Ricardo Jericho. Dgaddy, CMA

## 2017-12-26 ENCOUNTER — Encounter: Payer: Self-pay | Admitting: Physician Assistant

## 2017-12-26 DIAGNOSIS — E785 Hyperlipidemia, unspecified: Secondary | ICD-10-CM

## 2017-12-27 ENCOUNTER — Encounter: Payer: Self-pay | Admitting: Family

## 2017-12-27 MED ORDER — VALACYCLOVIR HCL 500 MG PO TABS: 500.0000 mg | ORAL_TABLET | Freq: Every day | ORAL | 12 refills | Status: DC

## 2017-12-27 MED ORDER — ROSUVASTATIN CALCIUM 40 MG PO TABS
40.0000 mg | ORAL_TABLET | ORAL | 12 refills | Status: AC
Start: 2017-12-27 — End: ?

## 2017-12-28 ENCOUNTER — Other Ambulatory Visit: Payer: Self-pay | Admitting: *Deleted

## 2017-12-28 DIAGNOSIS — Z0000 Encounter for general adult medical examination without abnormal findings: Secondary | ICD-10-CM

## 2017-12-28 MED ORDER — VALACYCLOVIR HCL 500 MG PO TABS: 500.0000 mg | ORAL_TABLET | Freq: Every day | ORAL | 12 refills | Status: DC

## 2017-12-30 ENCOUNTER — Other Ambulatory Visit: Payer: Self-pay

## 2017-12-30 ENCOUNTER — Telehealth: Payer: Self-pay | Admitting: Physician Assistant

## 2017-12-30 DIAGNOSIS — Z0000 Encounter for general adult medical examination without abnormal findings: Secondary | ICD-10-CM

## 2017-12-30 MED ORDER — VALACYCLOVIR HCL 500 MG PO TABS: 500.0000 mg | ORAL_TABLET | Freq: Every day | ORAL | 12 refills | Status: AC

## 2017-12-30 NOTE — Telephone Encounter (Signed)
Noted that Valacyclovir was reordered per office on 12/28/17, but was not sent through electronically. (sent "no print")  Resent the Valacyclovir 500 mg; #30; [facility] x 12, [facility].

## 2017-12-30 NOTE — Telephone Encounter (Signed)
Copied from [place] [id number]. Topic: Quick Communication - Rx Refill/Question >> Dec 30, 2017  9:33 AM Synthia Innocent wrote: Medication: valACYclovir (VALTREX) 500 MG tablet, pharmacy did not receive refill sent on 12/28/17  Has the patient contacted their pharmacy? Yes.   (Agent: If no, request that the patient contact the pharmacy for the refill.) (Agent: If yes, when and what did the pharmacy advise?)  Preferred Pharmacy (with phone number or street name[facility] [address]  Agent: Please be advised that RX refills may take up to 3 business days. We ask that you follow-up with your pharmacy.

## 2018-01-03 ENCOUNTER — Inpatient Hospital Stay: Payer: BLUE CROSS/BLUE SHIELD | Attending: Family | Admitting: Family

## 2018-01-03 ENCOUNTER — Other Ambulatory Visit: Payer: Self-pay

## 2018-01-03 ENCOUNTER — Encounter: Payer: Self-pay | Admitting: Family

## 2018-01-03 ENCOUNTER — Inpatient Hospital Stay: Payer: BLUE CROSS/BLUE SHIELD

## 2018-01-03 VITALS — BP 114/77 | HR 68 | Temp 98.3°F | Resp 18 | Wt 187.0 lb

## 2018-01-03 DIAGNOSIS — I82442 Acute embolism and thrombosis of left tibial vein: Secondary | ICD-10-CM

## 2018-01-03 DIAGNOSIS — I825Z2 Chronic embolism and thrombosis of unspecified deep veins of left distal lower extremity: Secondary | ICD-10-CM

## 2018-01-03 DIAGNOSIS — D6851 Activated protein C resistance: Secondary | ICD-10-CM | POA: Diagnosis not present

## 2018-01-03 DIAGNOSIS — R760 Raised antibody titer: Secondary | ICD-10-CM

## 2018-01-03 DIAGNOSIS — Z7901 Long term (current) use of anticoagulants: Secondary | ICD-10-CM

## 2018-01-03 DIAGNOSIS — D6859 Other primary thrombophilia: Secondary | ICD-10-CM

## 2018-01-03 DIAGNOSIS — Z862 Personal history of diseases of the blood and blood-forming organs and certain disorders involving the immune mechanism: Secondary | ICD-10-CM

## 2018-01-03 LAB — CBC WITH DIFFERENTIAL (CANCER CENTER ONLY)
Basophils Absolute: 0.1 10*3/uL (ref 0.0–0.1)
Basophils Relative: 1 %
Eosinophils Absolute: 0.2 10*3/uL (ref 0.0–0.5)
Eosinophils Relative: 3 %
HCT: 44 % (ref 38.7–49.9)
Hemoglobin: 15.2 g/dL (ref 13.0–17.1)
Lymphocytes Relative: 40 %
Lymphs Abs: 2.5 10*3/uL (ref 0.9–3.3)
MCH: 30.6 pg (ref 28.0–33.4)
MCHC: 34.5 g/dL (ref 32.0–35.9)
MCV: 88.7 fL (ref 82.0–98.0)
Monocytes Absolute: 0.5 10*3/uL (ref 0.1–0.9)
Monocytes Relative: 8 %
Neutro Abs: 2.9 10*3/uL (ref 1.5–6.5)
Neutrophils Relative %: 48 %
Platelet Count: 300 10*3/uL (ref 145–400)
RBC: 4.96 MIL/uL (ref 4.20–5.70)
RDW: 12.7 % (ref 11.1–15.7)
WBC Count: 6.1 10*3/uL (ref 4.0–10.0)

## 2018-01-03 LAB — CMP (CANCER CENTER ONLY)
ALT: 26 U/L (ref 10–47)
AST: 25 U/L (ref 11–38)
Albumin: 3.8 g/dL (ref 3.5–5.0)
Alkaline Phosphatase: 56 U/L (ref 26–84)
Anion gap: 4 — ABNORMAL LOW (ref 5–15)
BUN: 22 mg/dL (ref 7–22)
CO2: 24 mmol/L (ref 18–33)
Calcium: 9.1 mg/dL (ref 8.0–10.3)
Chloride: 109 mmol/L — ABNORMAL HIGH (ref 98–108)
Creatinine: 1.5 mg/dL — ABNORMAL HIGH (ref 0.60–1.20)
Glucose, Bld: 106 mg/dL (ref 73–118)
Potassium: 4.1 mmol/L (ref 3.3–4.7)
Sodium: 137 mmol/L (ref 128–145)
Total Bilirubin: 0.8 mg/dL (ref 0.2–1.6)
Total Protein: 7.5 g/dL (ref 6.4–8.1)

## 2018-01-03 LAB — D-DIMER, QUANTITATIVE (NOT AT ARMC): D-Dimer, Quant: <0.27 ug/mL-FEU (ref 0.00–0.50)

## 2018-01-03 NOTE — Progress Notes (Signed)
Hematology and Oncology Follow Up Visit  Benjamin [name] [id number] 02/10/1971 47 y.o. 01/03/2018   Principle Diagnosis:  Heterozygous for the Factor V Leiden R506Q mutation Acute DVT of the left posterior tibial vein Acute superficial thombosis of the small saphenous vein Positive lupus anticoagulant  Current Therapy:   Xarelto 20 mg PO daily   Interim History:  Benjamin [name] is here today for follow-up. He is doing well. He has had to do a good bit of travelling by place recently for work.  He is hydrating well and wearing his compression stocking for support. He will have some puffiness and tenderness in his left leg occasionally when on his feet for an extended period of time. The compression stocking does help with this.  No swelling, tenderness, numbness or tingling in his extremities at this time.  He is tolerating Xarelto well and has had no episodes of bleeding, no bruising or petechiae.  No lymphadenopathy noted on exam.  No fever, chills, n/v, cough, rash, dizziness, SOB, chest pain, palpitations, abdominal pain or changes in bowel or bladder habits.  He has migraines and takes Trokendi as needed.  He has noticed that his appetite decreases in the evenings. He is happy that his weight is back up to 187 lbs. He had been losing weight while on Topamax.  ECOG Performance Status: 1 - Symptomatic but completely ambulatory  Medications:  Allergies as of 01/03/2018      Reactions   Compazine [prochlorperazine Edisylate] Other (See Comments)   Cervical dystonia and panic attack   Pepto-bismol [bismuth Subsalicylate] Swelling      Medication List        Accurate as of 01/03/18 10:58 AM. Always use your most recent med list.          acyclovir ointment 5 % Commonly known as:  ZOVIRAX Apply to affected areas every 3 hours as needed   ALPRAZolam 0.25 MG tablet Commonly known as:  XANAX Take 1-2 tabs 30-60 minutes before MRI may repeat if needed. Do not drive.     bisoprolol-hydrochlorothiazide 5-6.25 MG tablet Commonly known as:  ZIAC TAKE 1 TABLET BY MOUTH DAILY.   buPROPion 300 MG 24 hr tablet Commonly known as:  WELLBUTRIN XL TAKE 1 TABLET BY MOUTH EVERY DAY   fluconazole 150 MG tablet Commonly known as:  DIFLUCAN Take 1 tablet (150 mg total) by mouth once a week.   fluticasone 50 MCG/ACT nasal spray Commonly known as:  FLONASE Place 2 sprays into both nostrils daily.   hyoscyamine 0.125 MG SL tablet Commonly known as:  LEVSIN SL Place 1 tablet (0.125 mg total) under the tongue every 4 (four) hours as needed for cramping.   ondansetron 4 MG tablet Commonly known as:  ZOFRAN Take 1 tablet (4 mg total) by mouth every 8 (eight) hours as needed for nausea or vomiting.   predniSONE 50 MG tablet Commonly known as:  DELTASONE Take 1 tab daily in the morning.   RABEprazole 20 MG tablet Commonly known as:  ACIPHEX Take 1 tablet (20 mg total) by mouth daily.   rivaroxaban 20 MG Tabs tablet Commonly known as:  XARELTO Take 1 tablet (20 mg total) by mouth daily with supper.   rizatriptan 10 MG disintegrating tablet Commonly known as:  MAXALT-MLT Take 1 tablet (10 mg total) as needed by mouth for migraine. May repeat in 2 hours if needed   rosuvastatin 40 MG tablet Commonly known as:  CRESTOR Take 1 tablet (40 mg total) by mouth  once a week.   tamsulosin 0.4 MG Caps capsule Commonly known as:  FLOMAX TAKE 1 CAPSULE BY MOUTH EVERY DAY   tiZANidine 4 MG tablet Commonly known as:  ZANAFLEX Take 1 tablet (4 mg total) every 6 (six) hours as needed by mouth for muscle spasms. Or for headaches.   Topiramate ER 25 MG Cp24 Commonly known as:  TROKENDI XR Take 50 mg by mouth daily.   traMADol 50 MG tablet Commonly known as:  ULTRAM Take 1-1.5 tablets (50-75 mg total) by mouth every 6 (six) hours as needed for severe pain.   valACYclovir 500 MG tablet Commonly known as:  VALTREX Take 1 tablet (500 mg total) by mouth daily.        Allergies:  Allergies  Allergen Reactions  . Compazine [Prochlorperazine Edisylate] Other (See Comments)    Cervical dystonia and panic attack  . Pepto-Bismol [Bismuth Subsalicylate] Swelling    Past Medical History, Surgical history, Social history, and Family History were reviewed and updated.  Review of Systems: All other 10 point review of systems is negative.   Physical Exam:  vitals were not taken for this visit.   Wt Readings from Last 3 Encounters:  11/29/17 182 lb (82.6 kg)  11/18/17 184 lb 6.4 oz (83.6 kg)  10/04/17 179 lb (81.2 kg)    Ocular: Sclerae unicteric, pupils equal, round and reactive to light Ear-nose-throat: Oropharynx clear, dentition fair Lymphatic: No cervical, supraclavicular or axillary adenopathy Lungs no rales or rhonchi, good excursion bilaterally Heart regular rate and rhythm, no murmur appreciated Abd soft, nontender, positive bowel sounds, no liver or spleen tip palpated on exam, no fluid wave  MSK no focal spinal tenderness, no joint edema Neuro: non-focal, well-oriented, appropriate affect Breasts: Deferred   Lab Results  Component Value Date   WBC 5.7 10/04/2017   HGB 14.8 10/04/2017   HCT 42.4 10/04/2017   MCV 88.1 10/04/2017   PLT 324 10/04/2017   Lab Results  Component Value Date   FERRITIN 111 05/24/2017   IRON 65 05/24/2017   TIBC 361 05/24/2017   UIBC 296 05/24/2017   IRONPCTSAT 18 (L) 05/24/2017   Lab Results  Component Value Date   RBC 4.81 10/04/2017   No results found for: KPAFRELGTCHN, LAMBDASER, KAPLAMBRATIO No results found for: IGGSERUM, IGA, IGMSERUM No results found for: Odetta Pink, SPEI   Chemistry      Component Value Date/Time   NA 141 10/04/2017 1032   NA 143 07/23/2017 1201   NA 141 05/24/2017 0845   K 3.5 10/04/2017 1032   K 3.6 05/24/2017 0845   CL 110 (H) 10/04/2017 1032   CL 106 05/24/2017 0845   CO2 23 10/04/2017 1032   CO2 26  05/24/2017 0845   BUN 16 10/04/2017 1032   BUN 19 07/23/2017 1201   BUN 17 05/24/2017 0845   CREATININE 1.38 (H) 10/04/2017 1032   CREATININE 1.4 (H) 05/24/2017 0845      Component Value Date/Time   CALCIUM 9.5 10/04/2017 1032   CALCIUM 9.0 05/24/2017 0845   ALKPHOS 63 10/04/2017 1032   ALKPHOS 66 05/24/2017 0845   AST 22 10/04/2017 1032   ALT 19 10/04/2017 1032   ALT 35 05/24/2017 0845   BILITOT 0.5 10/04/2017 1032      Impression and Plan: Mr. 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 Lupus anticoagulant has also been positive.  He continues to do well on Xarelto and will continue his same regimen for now. He will complete one year of full dose anticoagulation in November and we will look at putting him on maintenance dose at that time.   We will see him back in another 3 months for follow-up.  He will contact our office with any questions or concerns. We can certainly see him sooner if need be.   Laverna Peace, NP 8/5/201910:58 AM

## 2018-01-05 LAB — LUPUS ANTICOAGULANT PANEL
DRVVT: 114.3 s — ABNORMAL HIGH (ref 0.0–47.0)
PTT Lupus Anticoagulant: 49.4 s (ref 0.0–51.9)

## 2018-01-05 LAB — DRVVT MIX: dRVVT Mix: 63.2 s — ABNORMAL HIGH (ref 0.0–47.0)

## 2018-01-05 LAB — DRVVT CONFIRM: dRVVT Confirm: 2.3 ratio — ABNORMAL HIGH (ref 0.8–1.2)

## 2018-01-17 ENCOUNTER — Other Ambulatory Visit: Payer: Self-pay | Admitting: Family Medicine

## 2018-01-24 ENCOUNTER — Encounter: Payer: Self-pay | Admitting: Physician Assistant

## 2018-01-24 ENCOUNTER — Other Ambulatory Visit: Payer: Self-pay | Admitting: Physician Assistant

## 2018-01-24 NOTE — Telephone Encounter (Signed)
Wellbutrin refill Last refill 07/06/17; #90 1 refill ( filled by Dr Anitra Lauth) PCP Maxwell Marion 11/18/17 Pharmacy [facility] [place]   Not refilled due to question of patients PCP. See My Chart message @ [phone] today

## 2018-01-24 NOTE — Telephone Encounter (Signed)
Request for wellbutrin refill.  Last refill was by Dr Anitra Lauth 10/11/17. Philis Fendt now listed as PCP. Called patient and left message to call Dr Anitra Lauth office for clarification.

## 2018-01-25 MED ORDER — BUPROPION HCL ER (XL) 300 MG PO TB24: 300.0000 mg | ORAL_TABLET | Freq: Every day | ORAL | 3 refills | Status: DC

## 2018-01-27 ENCOUNTER — Ambulatory Visit (INDEPENDENT_AMBULATORY_CARE_PROVIDER_SITE_OTHER): Payer: BLUE CROSS/BLUE SHIELD | Admitting: Podiatry

## 2018-01-27 DIAGNOSIS — B070 Plantar wart: Secondary | ICD-10-CM | POA: Diagnosis not present

## 2018-01-27 DIAGNOSIS — L989 Disorder of the skin and subcutaneous tissue, unspecified: Secondary | ICD-10-CM

## 2018-01-27 NOTE — Progress Notes (Signed)
Subjective:  Patient ID: Benjamin [name], male    DOB: [birth year],  MRN: [id number]  Chief Complaint  Patient presents with  . Plantar Warts    left foot, 4th metatarsal -2nd treatment    47 y.o. male presents with the above complaint. Unsure how it looks compared to previous but is hurting him again. Has a trip in 2 weeks to the beach and is concerned about it hurting.  Review of Systems: Negative except as noted in the HPI. Denies N/V/F/Ch.  Past Medical History:  Diagnosis Date  . Acute DVT of left tibial vein ([place]) 04/24/2017   Xarelto started (dx'd in emergency dept).  Dr. Marin Olp recommended full dose xarelto x 40mo, with an additional year of maintenance dose xarelto (as of 05/2017 initial consult note).  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  Pt Factor V leiden heterozygote.  Stable as of 09/2017 hem f/u.  . Adenomatous colon polyp 10/04/15  . ALLERGIC RHINITIS 07/11/2008  . Allergy    SEASONAL  . 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 Anxiety   . BACK PAIN, CHRONIC, INTERMITTENT 10/17/2008  . Barrett's esophagus determined by biopsy 04/12/14  . BENIGN PROSTATIC HYPERTROPHY, WITH URINARY OBSTRUCTION 07/21/2007  . BURSITIS, HIP 09/30/2009  . CAP (community acquired pneumonia) 06/2014  . Cataracts, bilateral 2016   Not visually significant  . Chronic headaches    Migraine syndrome: worsening fall 2018, neuro eval 04/2017--topamax and maxalt started, MRI brain ordered (LP to be done if MRI neg--pt reporting fevers).  Pt referred for sleep study.  . Chronic renal insufficiency, stage II (mild)    GFR 60s-70s (suspect HTN and NSAID damage as etiology).  GFR dipped into 50s fall 2018 after lots of NSAID use for chronic daily HA's.  . Diverticulitis 2016   CT 01/2015  . Frequent headaches   . Gastric polyp 2006  . GERD without esophagitis   . Herpes labialis   . Hiatal hernia   . Hyperlipidemia   . HYPERTENSION 01/05/2007  . OSA (obstructive  sleep apnea) 04/2017   Dr. Rexene Alberts eval 04/2017-plan for sleep study.  Marland Kitchen PSORIASIS 01/05/2007    Current Outpatient Medications:  .  acyclovir ointment (ZOVIRAX) 5 %, Apply to affected areas every 3 hours as needed, Disp: 30 g, Rfl: 6 .  ALPRAZolam (XANAX) 0.25 MG tablet, Take 1-2 tabs 30-60 minutes before MRI may repeat if needed. Do not drive., Disp: 5 tablet, Rfl: 0 .  bisoprolol-hydrochlorothiazide (ZIAC) 5-6.25 MG tablet, TAKE 1 TABLET BY MOUTH DAILY., Disp: 90 tablet, Rfl: 1 .  buPROPion (WELLBUTRIN XL) 300 MG 24 hr tablet, Take 1 tablet (300 mg total) by mouth daily., Disp: 90 tablet, Rfl: 3 .  fluconazole (DIFLUCAN) 150 MG tablet, Take 1 tablet (150 mg total) by mouth once a week., Disp: 6 tablet, Rfl: 1 .  fluticasone (FLONASE) 50 MCG/ACT nasal spray, Place 2 sprays into both nostrils daily., Disp: 16 g, Rfl: 6 .  hyoscyamine (LEVSIN SL) 0.125 MG SL tablet, Place 1 tablet (0.125 mg total) under the tongue every 4 (four) hours as needed for cramping., Disp: 120 tablet, Rfl: 11 .  ondansetron (ZOFRAN) 4 MG tablet, Take 1 tablet (4 mg total) by mouth every 8 (eight) hours as needed for nausea or vomiting., Disp: 20 tablet, Rfl: 0 .  predniSONE (DELTASONE) 50 MG tablet, Take 1 tab daily in the morning., Disp: 5 tablet, Rfl: 0 .  RABEprazole (ACIPHEX) 20 MG tablet, Take  1 tablet (20 mg total) by mouth daily., Disp: 90 tablet, Rfl: 3 .  rivaroxaban (XARELTO) 20 MG TABS tablet, Take 1 tablet (20 mg total) by mouth daily with supper., Disp: 30 tablet, Rfl: 2 .  rizatriptan (MAXALT-MLT) 10 MG disintegrating tablet, Take 1 tablet (10 mg total) as needed by mouth for migraine. May repeat in 2 hours if needed, Disp: 10 tablet, Rfl: 0 .  rosuvastatin (CRESTOR) 40 MG tablet, Take 1 tablet (40 mg total) by mouth once a week., Disp: 4 tablet, Rfl: 12 .  tamsulosin (FLOMAX) 0.4 MG CAPS capsule, TAKE 1 CAPSULE BY MOUTH EVERY DAY, Disp: 90 capsule, Rfl: 0 .  tiZANidine (ZANAFLEX) 4 MG tablet, Take 1 tablet (4  mg total) every 6 (six) hours as needed by mouth for muscle spasms. Or for headaches., Disp: 60 tablet, Rfl: 6 .  Topiramate ER (TROKENDI XR) 25 MG CP24, Take 50 mg by mouth daily., Disp: 90 capsule, Rfl: 3 .  traMADol (ULTRAM) 50 MG tablet, Take 1-1.5 tablets (50-75 mg total) by mouth every 6 (six) hours as needed for severe pain., Disp: 45 tablet, Rfl: 0 .  valACYclovir (VALTREX) 500 MG tablet, Take 1 tablet (500 mg total) by mouth daily., Disp: 30 tablet, Rfl: 12 No current facility-administered medications for this visit.   Facility-Administered Medications Ordered in Other Visits:  .  gadopentetate dimeglumine (MAGNEVIST) injection 18 mL, 18 mL, Intravenous, Once PRN, Melvenia Beam, MD  Social History   Tobacco Use  Smoking Status Never Smoker  Smokeless Tobacco Never Used    Allergies  Allergen Reactions  . Compazine [Prochlorperazine Edisylate] Other (See Comments)    Cervical dystonia and panic attack  . Pepto-Bismol [Bismuth Subsalicylate] Swelling   Objective:  There were no vitals filed for this visit. There is no height or weight on file to calculate BMI. Constitutional Well developed. Well nourished.  Vascular Dorsalis pedis pulses palpable bilaterally. Posterior tibial pulses palpable bilaterally. Capillary refill normal to all digits.  No cyanosis or clubbing noted. Pedal hair growth normal.  Neurologic Normal speech. Oriented to person, place, and time. Epicritic sensation to light touch grossly present bilaterally.  Dermatologic Nails well groomed and normal in appearance. No open wounds. Hyperkeratotic lesion with petechiae L 4th metatarsal sulcus.  Orthopedic: Normal joint ROM without pain or crepitus bilaterally. No visible deformities. No bony tenderness.   Radiographs: None Assessment:   1. Verruca plantaris   2. Benign skin lesion    Plan:  Patient was evaluated and treated and all questions answered.  Verruca Plantaris -Re-drestruction as  below. -Should persist would consider surgical excision vs laser.  Procedure: Destruction of Lesion Location: L 4th metatarsal area Anesthesia: None Instrumentation: 15 blade. Technique: Debridement of lesion to petechial bleeding. Aperture pad applied around lesion. Small amount of canthrone applied to the base of the lesion. Dressing: Dry, sterile, compression dressing. Disposition: Patient tolerated procedure well. Advised to leave dressing on for 6-8 hours. Thereafter patient to wash the area with soap and water and applied band-aid. Off-loading pads dispensed. Patient to return in 4 weeks for follow-up.   Return in about 4 weeks (around 02/24/2018).

## 2018-02-08 ENCOUNTER — Emergency Department (HOSPITAL_COMMUNITY)
Admission: EM | Admit: 2018-02-08 | Discharge: 2018-02-08 | Disposition: A | Discharge status: Home / Self Care | Payer: BLUE CROSS/BLUE SHIELD | Attending: Emergency Medicine | Admitting: Emergency Medicine

## 2018-02-08 ENCOUNTER — Emergency Department (HOSPITAL_COMMUNITY): Payer: BLUE CROSS/BLUE SHIELD

## 2018-02-08 ENCOUNTER — Encounter (HOSPITAL_COMMUNITY): Payer: Self-pay | Admitting: Emergency Medicine

## 2018-02-08 ENCOUNTER — Ambulatory Visit (INDEPENDENT_AMBULATORY_CARE_PROVIDER_SITE_OTHER): Payer: BLUE CROSS/BLUE SHIELD | Admitting: Urgent Care

## 2018-02-08 ENCOUNTER — Encounter: Payer: Self-pay | Admitting: Urgent Care

## 2018-02-08 VITALS — BP 134/81 | HR 65 | Temp 97.7°F | Resp 16 | Ht 69.0 in | Wt 188.0 lb

## 2018-02-08 DIAGNOSIS — K5732 Diverticulitis of large intestine without perforation or abscess without bleeding: Secondary | ICD-10-CM

## 2018-02-08 DIAGNOSIS — D72829 Elevated white blood cell count, unspecified: Secondary | ICD-10-CM

## 2018-02-08 DIAGNOSIS — K5790 Diverticulosis of intestine, part unspecified, without perforation or abscess without bleeding: Secondary | ICD-10-CM | POA: Diagnosis not present

## 2018-02-08 DIAGNOSIS — K5792 Diverticulitis of intestine, part unspecified, without perforation or abscess without bleeding: Secondary | ICD-10-CM | POA: Diagnosis not present

## 2018-02-08 DIAGNOSIS — R1032 Left lower quadrant pain: Secondary | ICD-10-CM | POA: Diagnosis not present

## 2018-02-08 DIAGNOSIS — K529 Noninfective gastroenteritis and colitis, unspecified: Secondary | ICD-10-CM

## 2018-02-08 DIAGNOSIS — R7989 Other specified abnormal findings of blood chemistry: Secondary | ICD-10-CM

## 2018-02-08 DIAGNOSIS — Z79899 Other long term (current) drug therapy: Secondary | ICD-10-CM | POA: Diagnosis not present

## 2018-02-08 DIAGNOSIS — R1030 Lower abdominal pain, unspecified: Secondary | ICD-10-CM | POA: Diagnosis not present

## 2018-02-08 DIAGNOSIS — I10 Essential (primary) hypertension: Secondary | ICD-10-CM | POA: Diagnosis not present

## 2018-02-08 DIAGNOSIS — R3916 Straining to void: Secondary | ICD-10-CM

## 2018-02-08 DIAGNOSIS — Z7901 Long term (current) use of anticoagulants: Secondary | ICD-10-CM | POA: Diagnosis not present

## 2018-02-08 DIAGNOSIS — R110 Nausea: Secondary | ICD-10-CM

## 2018-02-08 DIAGNOSIS — F419 Anxiety disorder, unspecified: Secondary | ICD-10-CM | POA: Diagnosis not present

## 2018-02-08 DIAGNOSIS — Z8719 Personal history of other diseases of the digestive system: Secondary | ICD-10-CM

## 2018-02-08 DIAGNOSIS — R34 Anuria and oliguria: Secondary | ICD-10-CM

## 2018-02-08 LAB — I-STAT CHEM 8, ED
BUN: 15 mg/dL (ref 6–20)
Calcium, Ion: 1.2 mmol/L (ref 1.15–1.40)
Chloride: 105 mmol/L (ref 98–111)
Creatinine, Ser: 1.4 mg/dL — ABNORMAL HIGH (ref 0.61–1.24)
Glucose, Bld: 84 mg/dL (ref 70–99)
HCT: 43 % (ref 39.0–52.0)
Hemoglobin: 14.6 g/dL (ref 13.0–17.0)
Potassium: 3.6 mmol/L (ref 3.5–5.1)
Sodium: 139 mmol/L (ref 135–145)
TCO2: 22 mmol/L (ref 22–32)

## 2018-02-08 LAB — POCT URINALYSIS DIP (MANUAL ENTRY)
Bilirubin, UA: NEGATIVE
Glucose, UA: NEGATIVE mg/dL
Ketones, POC UA: NEGATIVE mg/dL
Leukocytes, UA: NEGATIVE
Nitrite, UA: NEGATIVE
Protein Ur, POC: NEGATIVE mg/dL
Spec Grav, UA: 1.01 (ref 1.010–1.025)
Urobilinogen, UA: 0.2 E.U./dL
pH, UA: 6 (ref 5.0–8.0)

## 2018-02-08 LAB — POCT CBC
Granulocyte percent: 75.2 %G (ref 37–80)
HCT, POC: 43.8 % (ref 43.5–53.7)
Hemoglobin: 15 g/dL (ref 14.1–18.1)
Lymph, poc: 2.6 (ref 0.6–3.4)
MCH, POC: 30.8 pg (ref 27–31.2)
MCHC: 34.1 g/dL (ref 31.8–35.4)
MCV: 90.1 fL (ref 80–97)
MID (cbc): 0.4 (ref 0–0.9)
MPV: 7 fL (ref 0–99.8)
POC Granulocyte: 9 — AB (ref 2–6.9)
POC LYMPH PERCENT: 21.3 %L (ref 10–50)
POC MID %: 3.5 %M (ref 0–12)
Platelet Count, POC: 323 10*3/uL (ref 142–424)
RBC: 4.86 M/uL (ref 4.69–6.13)
RDW, POC: 12.9 %
WBC: 12 10*3/uL — AB (ref 4.6–10.2)

## 2018-02-08 MED ORDER — METRONIDAZOLE 500 MG PO TABS: 500.0000 mg | ORAL_TABLET | Freq: Three times a day (TID) | ORAL | 0 refills | Status: DC

## 2018-02-08 MED ORDER — CIPROFLOXACIN HCL 500 MG PO TABS: 500.0000 mg | ORAL_TABLET | Freq: Two times a day (BID) | ORAL | 0 refills | Status: DC

## 2018-02-08 MED ORDER — ONDANSETRON HCL 4 MG/2ML IJ SOLN
4.0000 mg | INTRAMUSCULAR | Status: AC
  Administered 2018-02-08: 4 mg via INTRAMUSCULAR

## 2018-02-08 MED ORDER — HYDROMORPHONE HCL 1 MG/ML IJ SOLN
1.0000 mg | Freq: Once | INTRAMUSCULAR | Status: AC
  Administered 2018-02-08: 1 mg via INTRAVENOUS
  Filled 2018-02-08: qty 1

## 2018-02-08 MED ORDER — ONDANSETRON HCL 8 MG PO TABS: 4.0000 mg | ORAL_TABLET | Freq: Three times a day (TID) | ORAL | 0 refills | Status: DC | PRN

## 2018-02-08 MED ORDER — DIPHENHYDRAMINE HCL 25 MG PO CAPS
25.0000 mg | ORAL_CAPSULE | Freq: Once | ORAL | Status: AC
  Administered 2018-02-08: 25 mg via ORAL
  Filled 2018-02-08: qty 1

## 2018-02-08 MED ORDER — HYDROCODONE-ACETAMINOPHEN 5-325 MG PO TABS: 1.0000 | ORAL_TABLET | Freq: Four times a day (QID) | ORAL | 0 refills | Status: DC | PRN

## 2018-02-08 MED ORDER — METRONIDAZOLE IN NACL 5-0.79 MG/ML-% IV SOLN
500.0000 mg | Freq: Once | INTRAVENOUS | Status: AC
  Administered 2018-02-08: 500 mg via INTRAVENOUS
  Filled 2018-02-08: qty 100

## 2018-02-08 MED ORDER — CIPROFLOXACIN IN D5W 400 MG/200ML IV SOLN
400.0000 mg | Freq: Once | INTRAVENOUS | Status: AC
  Administered 2018-02-08: 400 mg via INTRAVENOUS
  Filled 2018-02-08: qty 200

## 2018-02-08 MED ORDER — ACETAMINOPHEN 325 MG PO TABS
650.0000 mg | ORAL_TABLET | Freq: Once | ORAL | Status: AC
  Administered 2018-02-08: 650 mg via ORAL
  Filled 2018-02-08: qty 2

## 2018-02-08 MED ORDER — IOPAMIDOL (ISOVUE-300) INJECTION 61%
100.0000 mL | Freq: Once | INTRAVENOUS | Status: AC | PRN
  Administered 2018-02-08: 100 mL via INTRAVENOUS

## 2018-02-08 MED ORDER — ONDANSETRON 4 MG PO TBDP: 8.0000 mg | ORAL_TABLET | Freq: Once | ORAL | Status: DC

## 2018-02-08 MED ORDER — ONDANSETRON 4 MG PO TBDP: ORAL_TABLET | ORAL | 0 refills | Status: DC

## 2018-02-08 MED ORDER — ONDANSETRON HCL 4 MG/2ML IJ SOLN
4.0000 mg | Freq: Once | INTRAMUSCULAR | Status: DC
  Filled 2018-02-08: qty 2

## 2018-02-08 MED ORDER — IOPAMIDOL (ISOVUE-300) INJECTION 61%
INTRAVENOUS | Status: AC
  Filled 2018-02-08: qty 100

## 2018-02-08 NOTE — ED Notes (Signed)
Pt's Cipro finished and Pt notified that his left lower lip was swollen. Notified Dr. Roderic Palau.

## 2018-02-08 NOTE — ED Provider Notes (Signed)
[facility] DEPT Provider Note   CSN: [id number] Arrival date & time: 02/08/18  1633     History   Chief Complaint Chief Complaint  Patient presents with  . Abdominal Pain  . Nausea    HPI Benjamin [name] is a 47 y.o. male.  Patient complains of lower abdominal pain.  No vomiting  The history is provided by the patient. No language interpreter was used.  Abdominal Pain   This is a new problem. The current episode started 2 days ago. The problem occurs constantly. The problem has not changed since onset.The pain is associated with an unknown factor. The pain is located in the LLQ. The quality of the pain is aching. The pain is at a severity of 5/10. The pain is moderate. Pertinent negatives include anorexia, diarrhea, frequency, hematuria and headaches. Nothing aggravates the symptoms. Nothing relieves the symptoms. Past workup does not include GI consult.    Past Medical History:  Diagnosis Date  . Acute DVT of left tibial vein ([place]) 04/24/2017   Xarelto started (dx'd in emergency dept).  Dr. Marin Olp recommended full dose xarelto x 56mo, with an additional year of maintenance dose xarelto (as of 05/2017 initial consult note).  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  Pt Factor V leiden heterozygote.  Stable as of 09/2017 hem f/u.  . Adenomatous colon polyp 10/04/15  . ALLERGIC RHINITIS 07/11/2008  . Allergy    SEASONAL  . 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 Anxiety   . BACK PAIN, CHRONIC, INTERMITTENT 10/17/2008  . Barrett's esophagus determined by biopsy 04/12/14  . BENIGN PROSTATIC HYPERTROPHY, WITH URINARY OBSTRUCTION 07/21/2007  . BURSITIS, HIP 09/30/2009  . CAP (community acquired pneumonia) 06/2014  . Cataracts, bilateral 2016   Not visually significant  . Chronic headaches    Migraine syndrome: worsening fall 2018, neuro eval 04/2017--topamax and maxalt started, MRI brain ordered (LP to be done if MRI  neg--pt reporting fevers).  Pt referred for sleep study.  . Chronic renal insufficiency, stage II (mild)    GFR 60s-70s (suspect HTN and NSAID damage as etiology).  GFR dipped into 50s fall 2018 after lots of NSAID use for chronic daily HA's.  . Diverticulitis 2016   CT 01/2015  . Frequent headaches   . Gastric polyp 2006  . GERD without esophagitis   . Herpes labialis   . Hiatal hernia   . Hyperlipidemia   . HYPERTENSION 01/05/2007  . OSA (obstructive sleep apnea) 04/2017   Dr. Rexene Alberts eval 04/2017-plan for sleep study.  Marland Kitchen PSORIASIS 01/05/2007    Patient Active Problem List   Diagnosis Date Noted  . Acute DVT of left tibial vein ([place]) 04/27/2017  . Chronic migraine without aura, intractable, with status migrainosus 04/09/2017  . New daily persistent headache 03/29/2017  . Maxillary sinusitis 03/29/2017  . Urinary frequency 09/02/2015  . Rash and nonspecific skin eruption 09/02/2015  . LLQ abdominal pain 08/09/2015  . History of diverticulitis 08/09/2015  . LLQ pain 08/06/2015  . Nausea without vomiting 08/06/2015  . Diverticulitis of colon 07/02/2015  . BMI 26.0-26.9,adult 09/17/2014  . Health maintenance examination 06/28/2014  . Adjustment disorder with mixed anxiety and depressed mood 02/19/2014  . Chronic cluster headache, not intractable 02/19/2014  . Herpes labialis 05/17/2012  . BURSITIS, HIP 09/30/2009  . BACK PAIN, CHRONIC, INTERMITTENT 10/17/2008  . ALLERGIC RHINITIS 07/11/2008  . BPH (benign prostatic hypertrophy) with urinary obstruction 07/21/2007  . Essential  hypertension 01/05/2007  . PSORIASIS 01/05/2007    Past Surgical History:  Procedure Laterality Date  . COLONOSCOPY W/ POLYPECTOMY  10/04/15   50mm tubular adenoma + diverticulosis: recall 5 yrs (Dr. Fuller Plan)  . ESOPHAGOGASTRODUODENOSCOPY  04/12/14   Barrett's esophagus  . LIPOMA EXCISION     several, arm        Home Medications    Prior to Admission medications   Medication Sig Start Date End Date  Taking? Authorizing Provider  acyclovir ointment (ZOVIRAX) 5 % Apply to affected areas every 3 hours as needed 06/18/17  Yes McGowen, Adrian Blackwater, MD  bisoprolol-hydrochlorothiazide (ZIAC) 5-6.25 MG tablet TAKE 1 TABLET BY MOUTH DAILY. 08/30/17  Yes McGowen, Adrian Blackwater, MD  buPROPion (WELLBUTRIN XL) 300 MG 24 hr tablet Take 1 tablet (300 mg total) by mouth daily. 01/25/18  Yes [name], PA-C  fluconazole (DIFLUCAN) 150 MG tablet Take 1 tablet (150 mg total) by mouth once a week. 11/29/17  Yes Evelina Bucy, DPM  hyoscyamine (LEVSIN SL) 0.125 MG SL tablet Place 1 tablet (0.125 mg total) under the tongue every 4 (four) hours as needed for cramping. 10/04/15  Yes Ladene Artist, MD  ondansetron (ZOFRAN) 8 MG tablet Take 0.5-1 tablets (4-8 mg total) by mouth every 8 (eight) hours as needed for nausea or vomiting. 02/08/18  Yes Jaynee Eagles, PA-C  RABEprazole (ACIPHEX) 20 MG tablet Take 1 tablet (20 mg total) by mouth daily. 12/06/16  Yes McGowen, Adrian Blackwater, MD  rivaroxaban (XARELTO) 20 MG TABS tablet Take 1 tablet (20 mg total) by mouth daily with supper. Patient taking differently: Take 20 mg by mouth every morning.  11/15/17  Yes [name], PA-C  rosuvastatin (CRESTOR) 40 MG tablet Take 1 tablet (40 mg total) by mouth once a week. 12/27/17  Yes [name], PA-C  tamsulosin (FLOMAX) 0.4 MG CAPS capsule TAKE 1 CAPSULE BY MOUTH EVERY DAY 10/11/17  Yes McGowen, Adrian Blackwater, MD  tiZANidine (ZANAFLEX) 4 MG tablet Take 1 tablet (4 mg total) every 6 (six) hours as needed by mouth for muscle spasms. Or for headaches. 04/09/17  Yes Melvenia Beam, MD  Topiramate ER (TROKENDI XR) 25 MG CP24 Take 50 mg by mouth daily. 11/15/17  Yes [name], PA-C  valACYclovir (VALTREX) 500 MG tablet Take 1 tablet (500 mg total) by mouth daily. 12/30/17  Yes [name], PA-C  ALPRAZolam Duanne Moron) 0.25 MG tablet Take 1-2 tabs 30-60 minutes before MRI may repeat if needed. Do not drive. Patient not taking: Reported on  02/08/2018 04/12/17   Melvenia Beam, MD  ciprofloxacin (CIPRO) 500 MG tablet Take 1 tablet (500 mg total) by mouth 2 (two) times daily. 02/08/18   Milton Ferguson, MD  fluticasone (FLONASE) 50 MCG/ACT nasal spray Place 2 sprays into both nostrils daily. Patient not taking: Reported on 02/08/2018 09/11/14   Tammi Sou, MD  HYDROcodone-acetaminophen (NORCO/VICODIN) 5-325 MG tablet Take 1 tablet by mouth every 6 (six) hours as needed for moderate pain. 02/08/18   Milton Ferguson, MD  metroNIDAZOLE (FLAGYL) 500 MG tablet Take 1 tablet (500 mg total) by mouth 3 (three) times daily. One po bid x 7 days 02/08/18   Milton Ferguson, MD  ondansetron (ZOFRAN ODT) 4 MG disintegrating tablet 4mg  ODT q4 hours prn nausea/vomit 02/08/18   Milton Ferguson, MD  predniSONE (DELTASONE) 50 MG tablet Take 1 tab daily in the morning. Patient not taking: Reported on 02/08/2018 11/18/17   [name], PA-C  rizatriptan (MAXALT-MLT) 10 MG disintegrating tablet Take 1 tablet (10 mg total) as needed by mouth for migraine. May repeat in 2 hours if needed Patient not taking: Reported on 02/08/2018 04/14/17   Dohmeier, Asencion Partridge, MD  traMADol (ULTRAM) 50 MG tablet Take 1-1.5 tablets (50-75 mg total) by mouth every 6 (six) hours as needed for severe pain. Patient not taking: Reported on 02/08/2018 11/18/17   [name], PA-C  lisinopril-hydrochlorothiazide (PRINZIDE,ZESTORETIC) 20-25 MG per tablet Take 1 tablet by mouth daily. 07/24/11 08/21/11  Marletta Lor, MD    Family History Family History  Problem Relation Age of Onset  . CAD Father        around age 54  . Heart disease Father   . Hyperlipidemia Father   . Hypertension Father   . CAD Paternal Grandmother   . Diabetes Paternal Grandmother   . Hypertension Mother   . Hypertension Brother   . Hyperlipidemia Brother   . Lung cancer Maternal Grandmother   . Cancer Maternal Grandfather        type unknown  . Colon cancer Unknown        neg hx  . Prostate  cancer Unknown        neg hx    Social History Social History   Tobacco Use  . Smoking status: Never Smoker  . Smokeless tobacco: Never Used  Substance Use Topics  . Alcohol use: Yes    Alcohol/week: 2.0 standard drinks    Types: 2 Cans of beer per week    Comment: occasionally  . Drug use: No     Allergies   Compazine [prochlorperazine edisylate] and Pepto-bismol [bismuth subsalicylate]   Review of Systems Review of Systems  Constitutional: Negative for appetite change and fatigue.  HENT: Negative for congestion, ear discharge and sinus pressure.   Eyes: Negative for discharge.  Respiratory: Negative for cough.   Cardiovascular: Negative for chest pain.  Gastrointestinal: Positive for abdominal pain. Negative for anorexia and diarrhea.  Genitourinary: Negative for frequency and hematuria.  Musculoskeletal: Negative for back pain.  Skin: Negative for rash.  Neurological: Negative for seizures and headaches.  Psychiatric/Behavioral: Negative for hallucinations.     Physical Exam Updated Vital Signs BP 120/80 (BP Location: Right Arm)   Pulse 87   Temp 98.8 F (37.1 C) (Oral)   Resp 14   SpO2 97%   Physical Exam  Constitutional: He is oriented to person, place, and time. He appears well-developed.  HENT:  Head: Normocephalic.  Eyes: Conjunctivae and EOM are normal. No scleral icterus.  Neck: Neck supple. No thyromegaly present.  Cardiovascular: Normal rate and regular rhythm. Exam reveals no gallop and no friction rub.  No murmur heard. Pulmonary/Chest: No stridor. He has no wheezes. He has no rales. He exhibits no tenderness.  Abdominal: He exhibits no distension. There is tenderness. There is no rebound.  Tender left lower quadrant  Musculoskeletal: Normal range of motion. He exhibits no edema.  Lymphadenopathy:    He has no cervical adenopathy.  Neurological: He is oriented to person, place, and time. He exhibits normal muscle tone. Coordination normal.    Skin: No rash noted. No erythema.  Psychiatric: He has a normal mood and affect. His behavior is normal.     ED Treatments / Results  Labs (all labs ordered are listed, but only abnormal results are displayed) Labs Reviewed  I-STAT CHEM 8, ED - Abnormal; Notable for the following components:      Result Value  Creatinine, Ser 1.40 (*)    All other components within normal limits    EKG None  Radiology Ct Abdomen Pelvis W Contrast  Result Date: 02/08/2018 CLINICAL DATA:  Abdominal pain and nausea beginning yesterday. EXAM: CT ABDOMEN AND PELVIS WITH CONTRAST TECHNIQUE: Multidetector CT imaging of the abdomen and pelvis was performed using the standard protocol following bolus administration of intravenous contrast. CONTRAST:  132mL ISOVUE-300 IOPAMIDOL (ISOVUE-300) INJECTION 61% COMPARISON:  01/31/2015 FINDINGS: Lower Chest: No acute findings. Hepatobiliary: No hepatic masses identified. Gallbladder is unremarkable. Pancreas:  No mass or inflammatory changes. Spleen: Within normal limits in size and appearance. Adrenals/Urinary Tract: No masses identified. No evidence of hydronephrosis. Stomach/Bowel: Mild-to-moderate diverticulitis is seen involving the sigmoid colon. No evidence of abscess, extraluminal air, or bowel obstruction. Vascular/Lymphatic: No pathologically enlarged lymph nodes. No abdominal aortic aneurysm. Reproductive:  No mass or other significant abnormality. Other:  None. Musculoskeletal:  No suspicious bone lesions identified. IMPRESSION: Mild-to-moderate sigmoid diverticulitis. No evidence of abscess or other complication. Electronically Signed   By: Earle Gell M.D.   On: 02/08/2018 19:19    Procedures Procedures (including critical care time)  Medications Ordered in ED Medications  ondansetron (ZOFRAN) injection 4 mg (4 mg Intravenous Refused 02/08/18 1727)  metroNIDAZOLE (FLAGYL) IVPB 500 mg (500 mg Intravenous New Bag/Given 02/08/18 2047)  ciprofloxacin (CIPRO)  IVPB 400 mg (has no administration in time range)  iopamidol (ISOVUE-300) 61 % injection 100 mL (100 mLs Intravenous Contrast Given 02/08/18 1832)  HYDROmorphone (DILAUDID) injection 1 mg (1 mg Intravenous Given 02/08/18 1935)     Initial Impression / Assessment and Plan / ED Course  I have reviewed the triage vital signs and the nursing notes.  Pertinent labs & imaging results that were available during my care of the patient were reviewed by me and considered in my medical decision making (see chart for details).     Patient with mild to moderate diverticulitis he will be placed on Cipro Flagyl Vicodin and Zofran and follow-up with GI  Final Clinical Impressions(s) / ED Diagnoses   Final diagnoses:  Diverticulitis    ED Discharge Orders         Ordered    ciprofloxacin (CIPRO) 500 MG tablet  2 times daily     02/08/18 2108    metroNIDAZOLE (FLAGYL) 500 MG tablet  3 times daily     02/08/18 2108    HYDROcodone-acetaminophen (NORCO/VICODIN) 5-325 MG tablet  Every 6 hours PRN     02/08/18 2108    ondansetron (ZOFRAN ODT) 4 MG disintegrating tablet     02/08/18 2108           Milton Ferguson, MD 02/08/18 2110

## 2018-02-08 NOTE — Progress Notes (Signed)
MRN: [id number] DOB: [birth year]  Subjective:   Benjamin [name] is a 47 y.o. male presenting for 2 day history of constipation, hard stools, has mid-left lower abdominal pain, nausea without vomiting. Tried Miralax yesterday, did not help. Still has small bowel movements. Has a history of diverticulitis as seen in 2017. Did try ondansetron, hyoscyamine with some relief. Has also had some urinary straining, decreased urine output. Denies bloody stools, fever. PMH as below but significant for dvt, BPH with urinary obstruction.  reports that he has never smoked. He has never used smokeless tobacco. He reports that he drinks about 2.0 standard drinks of alcohol per week. He reports that he does not use drugs.   Benjamin [name] has a current medication list which includes the following prescription(s): acyclovir ointment, alprazolam, bisoprolol-hydrochlorothiazide, bupropion, fluticasone, hyoscyamine, ondansetron, prednisone, rabeprazole, rivaroxaban, rizatriptan, rosuvastatin, tamsulosin, tizanidine, topiramate er, tramadol, valacyclovir, fluconazole, and lisinopril-hydrochlorothiazide, and the following Facility-Administered Medications: gadopentetate dimeglumine. Also is allergic to compazine [prochlorperazine edisylate] and pepto-bismol [bismuth subsalicylate].  Benjamin [name]  has a past medical history of Acute DVT of left tibial vein ([place]) (04/24/2017), Adenomatous colon polyp (10/04/15), ALLERGIC RHINITIS (07/11/2008), Allergy, Angiolipoma (2007), Anxiety, BACK PAIN, CHRONIC, INTERMITTENT (10/17/2008), Barrett's esophagus determined by biopsy (04/12/14), BENIGN PROSTATIC HYPERTROPHY, WITH URINARY OBSTRUCTION (07/21/2007), BURSITIS, HIP (09/30/2009), CAP (community acquired pneumonia) (06/2014), Cataracts, bilateral (2016), Chronic headaches, Chronic renal insufficiency, stage II (mild), Diverticulitis (2016), Frequent headaches, Gastric polyp (2006), GERD without esophagitis, Herpes labialis, Hiatal hernia, Hyperlipidemia,  HYPERTENSION (01/05/2007), OSA (obstructive sleep apnea) (04/2017), and PSORIASIS (01/05/2007). Also  has a past surgical history that includes Lipoma excision; Esophagogastroduodenoscopy (04/12/14); and Colonoscopy w/ polypectomy (10/04/15).  Objective:   Vitals: BP 134/81   Pulse 65   Temp 97.7 F (36.5 C) (Oral)   Resp 16   Ht 5\' 9"  (1.753 m)   Wt 188 lb (85.3 kg)   SpO2 98%   BMI 27.76 kg/m   Physical Exam  Constitutional: He is oriented to person, place, and time. He appears well-developed and well-nourished.  HENT:  Mouth/Throat: Oropharynx is clear and moist.  Eyes: No scleral icterus.  Cardiovascular: Normal rate, regular rhythm and intact distal pulses. Exam reveals no gallop and no friction rub.  No murmur heard. Pulmonary/Chest: No respiratory distress. He has no wheezes. He has no rales.  Abdominal: Soft. Bowel sounds are normal. He exhibits no distension and no mass. There is tenderness in the right lower quadrant, suprapubic area and left lower quadrant. There is guarding (lower abdomen). There is no rigidity, no rebound and no CVA tenderness.  Neurological: He is alert and oriented to person, place, and time.  Skin: Skin is warm and dry.  Psychiatric: He has a normal mood and affect.   Results for orders placed or performed in visit on 02/08/18 (from the past 24 hour(s))  POCT urinalysis dipstick     Status: Abnormal   Collection Time: 02/08/18  4:05 PM  Result Value Ref Range   Color, UA yellow yellow   Clarity, UA clear clear   Glucose, UA negative negative mg/dL   Bilirubin, UA negative negative   Ketones, POC UA negative negative mg/dL   Spec Grav, UA 1.010 1.010 - 1.025   Blood, UA small (A) negative   pH, UA 6.0 5.0 - 8.0   Protein Ur, POC negative negative mg/dL   Urobilinogen, UA 0.2 0.2 or 1.0 E.U./dL   Nitrite, UA Negative Negative   Leukocytes, UA Negative Negative  POCT CBC     Status:  Abnormal   Collection Time: 02/08/18  4:08 PM  Result Value Ref  Range   WBC 12.0 (A) 4.6 - 10.2 K/uL   Lymph, poc 2.6 0.6 - 3.4   POC LYMPH PERCENT 21.3 10 - 50 %L   MID (cbc) 0.4 0 - 0.9   POC MID % 3.5 0 - 12 %M   POC Granulocyte 9.0 (A) 2 - 6.9   Granulocyte percent 75.2 37 - 80 %G   RBC 4.86 4.69 - 6.13 M/uL   Hemoglobin 15.0 14.1 - 18.1 g/dL   HCT, POC 43.8 43.5 - 53.7 %   MCV 90.1 80 - 97 fL   MCH, POC 30.8 27 - 31.2 pg   MCHC 34.1 31.8 - 35.4 g/dL   RDW, POC 12.9 %   Platelet Count, POC 323 142 - 424 K/uL   MPV 7.0 0 - 99.8 fL    Assessment and Plan :   Diverticulitis of colon  Lower abdominal pain - Plan: POCT CBC, POCT urinalysis dipstick, ondansetron (ZOFRAN) injection 4 mg  LLQ pain - Plan: POCT CBC, POCT urinalysis dipstick  Diverticulosis  History of diverticulitis  Urinary straining  Decreased urination  Nausea without vomiting - Plan: DISCONTINUED: ondansetron (ZOFRAN-ODT) disintegrating tablet 8 mg  Elevated serum creatinine  Gastroenteritis - Plan: ondansetron (ZOFRAN) 8 MG tablet  Leukocytosis, unspecified type  Counseled patient on diagnosis of diverticulitis and its complications. Patient is planning on leaving on a beach trip in 2 days. He presents today with severe lower abdominal pain, worse in LLQ. CBC demonstrates leukocytosis with a left shift. Patient will present to the ER for an emergent evaluation and consideration for a CT abdomen/pelvis to rule out a life threatening infection.   Jaynee Eagles, PA-C [facility] [phone] 02/08/2018  3:43 PM

## 2018-02-08 NOTE — Discharge Instructions (Addendum)
Follow-up with your GI doctor next week Dr. Fuller Plan return if high fever vomiting or severe pain

## 2018-02-08 NOTE — Patient Instructions (Addendum)
I am very concerned that you are having a recurrence of diverticulitis and are at risk for its complications which can be life threatening. It is my clinical opinion that you need to have a CT of your abdomen/pelvis to make sure you that you do not have a life threatening infection. And therefore, I recommend you report to the ER immediately for an emergent evaluation. You will be evaluated by a different medical team there but they can pick up where we left off.     Diverticulosis Diverticulosis is a condition that develops when small pouches (diverticula) form in the wall of the large intestine (colon). The colon is where water is absorbed and stool is formed. The pouches form when the inside layer of the colon pushes through weak spots in the outer layers of the colon. You may have a few pouches or many of them. What are the causes? The cause of this condition is not known. What increases the risk? The following factors may make you more likely to develop this condition:  Being older than age 38. Your risk for this condition increases with age. Diverticulosis is rare among people younger than age 56. By age 33, many people have it.  Eating a low-fiber diet.  Having frequent constipation.  Being overweight.  Not getting enough exercise.  Smoking.  Taking over-the-counter pain medicines, like aspirin and ibuprofen.  Having a family history of diverticulosis.  What are the signs or symptoms? In most people, there are no symptoms of this condition. If you do have symptoms, they may include:  Bloating.  Cramps in the abdomen.  Constipation or diarrhea.  Pain in the lower left side of the abdomen.  How is this diagnosed? This condition is most often diagnosed during an exam for other colon problems. Because diverticulosis usually has no symptoms, it often cannot be diagnosed independently. This condition may be diagnosed by:  Using a flexible scope to examine the colon  (colonoscopy).  Taking an X-ray of the colon after dye has been put into the colon (barium enema).  Doing a CT scan.  How is this treated? You may not need treatment for this condition if you have never developed an infection related to diverticulosis. If you have had an infection before, treatment may include:  Eating a high-fiber diet. This may include eating more fruits, vegetables, and grains.  Taking a fiber supplement.  Taking a live bacteria supplement (probiotic).  Taking medicine to relax your colon.  Taking antibiotic medicines.  Follow these instructions at home:  Drink 6-8 glasses of water or more each day to prevent constipation.  Try not to strain when you have a bowel movement.  If you have had an infection before: ? Eat more fiber as directed by your health care provider or your diet and nutrition specialist (dietitian). ? Take a fiber supplement or probiotic, if your health care provider approves.  Take over-the-counter and prescription medicines only as told by your health care provider.  If you were prescribed an antibiotic, take it as told by your health care provider. Do not stop taking the antibiotic even if you start to feel better.  Keep all follow-up visits as told by your health care provider. This is important. Contact a health care provider if:  You have pain in your abdomen.  You have bloating.  You have cramps.  You have not had a bowel movement in 3 days. Get help right away if:  Your pain gets worse.  Your  bloating becomes very bad.  You have a fever or chills, and your symptoms suddenly get worse.  You vomit.  You have bowel movements that are bloody or black.  You have bleeding from your rectum. Summary  Diverticulosis is a condition that develops when small pouches (diverticula) form in the wall of the large intestine (colon).  You may have a few pouches or many of them.  This condition is most often diagnosed during an  exam for other colon problems.  If you have had an infection related to diverticulosis, treatment may include increasing the fiber in your diet, taking supplements, or taking medicines. This information is not intended to replace advice given to you by your health care provider. Make sure you discuss any questions you have with your health care provider. Document Released: 02/13/2004 Document Revised: 04/06/2016 Document Reviewed: 04/06/2016 Elsevier Interactive Patient Education  2017 Elsevier [employer].     Diverticulitis Diverticulitis is infection or inflammation of small pouches (diverticula) in the colon that form due to a condition called diverticulosis. Diverticula can trap stool (feces) and bacteria, causing infection and inflammation. Diverticulitis may cause severe stomach pain and diarrhea. It may lead to tissue damage in the colon that causes bleeding. The diverticula may also burst (rupture) and cause infected stool to enter other areas of the abdomen. Complications of diverticulitis can include:  Bleeding.  Severe infection.  Severe pain.  Rupture (perforation) of the colon.  Blockage (obstruction) of the colon.  What are the causes? This condition is caused by stool becoming trapped in the diverticula, which allows bacteria to grow in the diverticula. This leads to inflammation and infection. What increases the risk? You are more likely to develop this condition if:  You have diverticulosis. The risk for diverticulosis increases if: ? You are overweight or obese. ? You use tobacco products. ? You do not get enough exercise.  You eat a diet that does not include enough fiber. High-fiber foods include fruits, vegetables, beans, nuts, and whole grains.  What are the signs or symptoms? Symptoms of this condition may include:  Pain and tenderness in the abdomen. The pain is normally located on the left side of the abdomen, but it may occur in other areas.  Fever and  chills.  Bloating.  Cramping.  Nausea.  Vomiting.  Changes in bowel routines.  Blood in your stool.  How is this diagnosed? This condition is diagnosed based on:  Your medical history.  A physical exam.  Tests to make sure there is nothing else causing your condition. These tests may include: ? Blood tests. ? Urine tests. ? Imaging tests of the abdomen, including X-rays, ultrasounds, MRIs, or CT scans.  How is this treated? Most cases of this condition are mild and can be treated at home. Treatment may include:  Taking over-the-counter pain medicines.  Following a clear liquid diet.  Taking antibiotic medicines by mouth.  Rest.  More severe cases may need to be treated at a hospital. Treatment may include:  Not eating or drinking.  Taking prescription pain medicine.  Receiving antibiotic medicines through an IV tube.  Receiving fluids and nutrition through an IV tube.  Surgery.  When your condition is under control, your health care provider may recommend that you have a colonoscopy. This is an exam to look at the entire large intestine. During the exam, a lubricated, bendable tube is inserted into the anus and then passed into the rectum, colon, and other parts of the large intestine. A  colonoscopy can show how severe your diverticula are and whether something else may be causing your symptoms. Follow these instructions at home: Medicines  Take over-the-counter and prescription medicines only as told by your health care provider. These include fiber supplements, probiotics, and stool softeners.  If you were prescribed an antibiotic medicine, take it as told by your health care provider. Do not stop taking the antibiotic even if you start to feel better.  Do not drive or use heavy machinery while taking prescription pain medicine. General instructions  Follow a full liquid diet or another diet as directed by your health care provider. After your symptoms  improve, your health care provider may tell you to change your diet. He or she may recommend that you eat a diet that contains at least 25 g (25 grams) of fiber daily. Fiber makes it easier to pass stool. Healthy sources of fiber include: ? Berries. One cup contains 4-8 grams of fiber. ? Beans or lentils. One half cup contains 5-8 grams of fiber. ? Green vegetables. One cup contains 4 grams of fiber.  Exercise for at least 30 minutes, 3 times each week. You should exercise hard enough to raise your heart rate and break a sweat.  Keep all follow-up visits as told by your health care provider. This is important. You may need a colonoscopy. Contact a health care provider if:  Your pain does not improve.  You have a hard time drinking or eating food.  Your bowel movements do not return to normal. Get help right away if:  Your pain gets worse.  Your symptoms do not get better with treatment.  Your symptoms suddenly get worse.  You have a fever.  You vomit more than one time.  You have stools that are bloody, black, or tarry. Summary  Diverticulitis is infection or inflammation of small pouches (diverticula) in the colon that form due to a condition called diverticulosis. Diverticula can trap stool (feces) and bacteria, causing infection and inflammation.  You are at higher risk for this condition if you have diverticulosis and you eat a diet that does not include enough fiber.  Most cases of this condition are mild and can be treated at home. More severe cases may need to be treated at a hospital.  When your condition is under control, your health care provider may recommend that you have an exam called a colonoscopy. This exam can show how severe your diverticula are and whether something else may be causing your symptoms. This information is not intended to replace advice given to you by your health care provider. Make sure you discuss any questions you have with your health care  provider. Document Released: 02/25/2005 Document Revised: 06/20/2016 Document Reviewed: 06/20/2016 Elsevier Interactive Patient Education  [employer].     If you have lab work done today you will be contacted with your lab results within the next 2 weeks.  If you have not heard from [place] then please contact us. The fastest way to get your results is to register for My Chart.   IF you received an x-ray today, you will receive an invoice from [facility]. Please contact [facility] at [phone] with questions or concerns regarding your invoice.   IF you received labwork today, you will receive an invoice from [facility]. Please contact [facility] at [phone] with questions or concerns regarding your invoice.   Our billing staff will not be able to assist you with questions regarding bills from these companies.  You will be contacted with the lab results as soon as they are available. The fastest way to get your results is to activate your My Chart account. Instructions are located on the last page of this paperwork. If you have not heard from [place] regarding the results in 2 weeks, please contact this office.

## 2018-02-08 NOTE — ED Triage Notes (Signed)
Pt repots abd pains with nausea since yesterday morning. Sent by PCP for CT scan to rule out diverticulitis. Pt has hx in 2017.

## 2018-02-11 ENCOUNTER — Other Ambulatory Visit: Payer: Self-pay | Admitting: Physician Assistant

## 2018-02-11 ENCOUNTER — Other Ambulatory Visit: Payer: Self-pay | Admitting: Family Medicine

## 2018-02-11 NOTE — Telephone Encounter (Signed)
Patient called, unable to leave VM due to mailbox full. Former patient of Philis Fendt, [place]. Patient will need to indicate if he is staying at [place] or going to [facility] new practice. If staying at [facility], will need to establish with new provider.  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

## 2018-02-16 ENCOUNTER — Other Ambulatory Visit: Payer: Self-pay | Admitting: Family

## 2018-02-24 ENCOUNTER — Encounter: Payer: Self-pay | Admitting: Podiatry

## 2018-02-24 ENCOUNTER — Ambulatory Visit (INDEPENDENT_AMBULATORY_CARE_PROVIDER_SITE_OTHER): Payer: BLUE CROSS/BLUE SHIELD | Admitting: Podiatry

## 2018-02-24 DIAGNOSIS — L989 Disorder of the skin and subcutaneous tissue, unspecified: Secondary | ICD-10-CM | POA: Diagnosis not present

## 2018-02-24 DIAGNOSIS — B070 Plantar wart: Secondary | ICD-10-CM

## 2018-02-24 NOTE — Progress Notes (Signed)
Subjective:  Patient ID: Benjamin [name], male    DOB: [birth year],  MRN: [id number]  Chief Complaint  Patient presents with  . Plantar Warts    left foot follow up; pt stated, "doing good, think its clearing up"    47 y.o. male presents with the above complaint. Thinks it's doing much better. Denies pain or issues with it.  Review of Systems: Negative except as noted in the HPI. Denies N/V/F/Ch.  Past Medical History:  Diagnosis Date  . Acute DVT of left tibial vein ([place]) 04/24/2017   Xarelto started (dx'd in emergency dept).  Dr. Marin Olp recommended full dose xarelto x 67mo, with an additional year of maintenance dose xarelto (as of 05/2017 initial consult note).  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  Pt Factor V leiden heterozygote.  Stable as of 09/2017 hem f/u.  . Adenomatous colon polyp 10/04/15  . ALLERGIC RHINITIS 07/11/2008  . Allergy    SEASONAL  . 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 Anxiety   . BACK PAIN, CHRONIC, INTERMITTENT 10/17/2008  . Barrett's esophagus determined by biopsy 04/12/14  . BENIGN PROSTATIC HYPERTROPHY, WITH URINARY OBSTRUCTION 07/21/2007  . BURSITIS, HIP 09/30/2009  . CAP (community acquired pneumonia) 06/2014  . Cataracts, bilateral 2016   Not visually significant  . Chronic headaches    Migraine syndrome: worsening fall 2018, neuro eval 04/2017--topamax and maxalt started, MRI brain ordered (LP to be done if MRI neg--pt reporting fevers).  Pt referred for sleep study.  . Chronic renal insufficiency, stage II (mild)    GFR 60s-70s (suspect HTN and NSAID damage as etiology).  GFR dipped into 50s fall 2018 after lots of NSAID use for chronic daily HA's.  . Diverticulitis 2016   CT 01/2015  . Frequent headaches   . Gastric polyp 2006  . GERD without esophagitis   . Herpes labialis   . Hiatal hernia   . Hyperlipidemia   . HYPERTENSION 01/05/2007  . OSA (obstructive sleep apnea) 04/2017   Dr. Rexene Alberts eval  04/2017-plan for sleep study.  Marland Kitchen PSORIASIS 01/05/2007    Current Outpatient Medications:  .  acyclovir ointment (ZOVIRAX) 5 %, Apply to affected areas every 3 hours as needed, Disp: 30 g, Rfl: 6 .  ALPRAZolam (XANAX) 0.25 MG tablet, Take 1-2 tabs 30-60 minutes before MRI may repeat if needed. Do not drive., Disp: 5 tablet, Rfl: 0 .  bisoprolol-hydrochlorothiazide (ZIAC) 5-6.25 MG tablet, TAKE 1 TABLET BY MOUTH DAILY., Disp: 90 tablet, Rfl: 1 .  buPROPion (WELLBUTRIN XL) 300 MG 24 hr tablet, Take 1 tablet (300 mg total) by mouth daily., Disp: 90 tablet, Rfl: 3 .  ciprofloxacin (CIPRO) 500 MG tablet, Take 1 tablet (500 mg total) by mouth 2 (two) times daily., Disp: 20 tablet, Rfl: 0 .  fluconazole (DIFLUCAN) 150 MG tablet, Take 1 tablet (150 mg total) by mouth once a week., Disp: 6 tablet, Rfl: 1 .  fluticasone (FLONASE) 50 MCG/ACT nasal spray, Place 2 sprays into both nostrils daily., Disp: 16 g, Rfl: 6 .  HYDROcodone-acetaminophen (NORCO/VICODIN) 5-325 MG tablet, Take 1 tablet by mouth every 6 (six) hours as needed for moderate pain., Disp: 20 tablet, Rfl: 0 .  hyoscyamine (LEVSIN SL) 0.125 MG SL tablet, Place 1 tablet (0.125 mg total) under the tongue every 4 (four) hours as needed for cramping., Disp: 120 tablet, Rfl: 11 .  metroNIDAZOLE (FLAGYL) 500 MG tablet, Take 1 tablet (500 mg total) by mouth 3 (three)  times daily. One po bid x 7 days, Disp: 30 tablet, Rfl: 0 .  ondansetron (ZOFRAN ODT) 4 MG disintegrating tablet, 4mg  ODT q4 hours prn nausea/vomit, Disp: 10 tablet, Rfl: 0 .  ondansetron (ZOFRAN) 8 MG tablet, Take 0.5-1 tablets (4-8 mg total) by mouth every 8 (eight) hours as needed for nausea or vomiting., Disp: 30 tablet, Rfl: 0 .  predniSONE (DELTASONE) 50 MG tablet, Take 1 tab daily in the morning., Disp: 5 tablet, Rfl: 0 .  RABEprazole (ACIPHEX) 20 MG tablet, Take 1 tablet (20 mg total) by mouth daily., Disp: 90 tablet, Rfl: 3 .  rizatriptan (MAXALT-MLT) 10 MG disintegrating tablet, Take  1 tablet (10 mg total) as needed by mouth for migraine. May repeat in 2 hours if needed, Disp: 10 tablet, Rfl: 0 .  rosuvastatin (CRESTOR) 40 MG tablet, Take 1 tablet (40 mg total) by mouth once a week., Disp: 4 tablet, Rfl: 12 .  tamsulosin (FLOMAX) 0.4 MG CAPS capsule, TAKE 1 CAPSULE BY MOUTH EVERY DAY, Disp: 90 capsule, Rfl: 0 .  tiZANidine (ZANAFLEX) 4 MG tablet, Take 1 tablet (4 mg total) every 6 (six) hours as needed by mouth for muscle spasms. Or for headaches., Disp: 60 tablet, Rfl: 6 .  Topiramate ER (TROKENDI XR) 25 MG CP24, Take 50 mg by mouth daily., Disp: 90 capsule, Rfl: 3 .  traMADol (ULTRAM) 50 MG tablet, Take 1-1.5 tablets (50-75 mg total) by mouth every 6 (six) hours as needed for severe pain., Disp: 45 tablet, Rfl: 0 .  valACYclovir (VALTREX) 500 MG tablet, Take 1 tablet (500 mg total) by mouth daily., Disp: 30 tablet, Rfl: 12 .  XARELTO 20 MG TABS tablet, TAKE 1 TABLET (20 MG TOTAL) BY MOUTH DAILY WITH SUPPER., Disp: 90 tablet, Rfl: 0 .  XARELTO 20 MG TABS tablet, TAKE 1 TABLET (20 MG TOTAL) BY MOUTH DAILY WITH SUPPER., Disp: 90 tablet, Rfl: 0 No current facility-administered medications for this visit.   Facility-Administered Medications Ordered in Other Visits:  .  gadopentetate dimeglumine (MAGNEVIST) injection 18 mL, 18 mL, Intravenous, Once PRN, Melvenia Beam, MD  Social History   Tobacco Use  Smoking Status Never Smoker  Smokeless Tobacco Never Used    Allergies  Allergen Reactions  . Compazine [Prochlorperazine Edisylate] Other (See Comments)    Cervical dystonia and panic attack  . Pepto-Bismol [Bismuth Subsalicylate] Swelling   Objective:  There were no vitals filed for this visit. There is no height or weight on file to calculate BMI. Constitutional Well developed. Well nourished.  Vascular Dorsalis pedis pulses palpable bilaterally. Posterior tibial pulses palpable bilaterally. Capillary refill normal to all digits.  No cyanosis or clubbing  noted. Pedal hair growth normal.  Neurologic Normal speech. Oriented to person, place, and time. Epicritic sensation to light touch grossly present bilaterally.  Dermatologic Nails well groomed and normal in appearance. No open wounds. Small continued verrucous lesion left fourth metatarsal area.  Orthopedic: Normal joint ROM without pain or crepitus bilaterally. No visible deformities. No bony tenderness.   Radiographs: None Assessment:   1. Verruca plantaris   2. Benign skin lesion    Plan:  Patient was evaluated and treated and all questions answered.  Verruca Plantaris Left -Destroyed as below.  Procedure: Destruction of Lesion Location: L 4th metatarsal area Anesthesia: none Instrumentation: 15 blade. Technique: Debridement of lesion to petechial bleeding. Aperture pad applied around lesion. Small amount of salinocaine applied to the base of the lesion. Dressing: Dry, sterile, compression dressing. Disposition: Patient tolerated  procedure well. Advised to leave dressing on for 6-8 hours. Thereafter patient to wash the area with soap and water and applied band-aid. Off-loading pads dispensed. Patient to return in 2 weeks for follow-up.   Return if symptoms worsen or fail to improve.

## 2018-03-03 ENCOUNTER — Other Ambulatory Visit: Payer: Self-pay | Admitting: Family Medicine

## 2018-03-10 ENCOUNTER — Other Ambulatory Visit: Payer: Self-pay | Admitting: Physician Assistant

## 2018-03-15 ENCOUNTER — Other Ambulatory Visit: Payer: Self-pay | Admitting: Family Medicine

## 2018-03-29 ENCOUNTER — Telehealth: Payer: Self-pay | Admitting: *Deleted

## 2018-03-29 ENCOUNTER — Encounter: Payer: Self-pay | Admitting: Family

## 2018-03-29 NOTE — Telephone Encounter (Signed)
Unable to reach pt, lmovm that our office is unable to refill pt's blood pressure medication.  Pt will need to reach out to PCP.  Pt advised he does not have a PCP in prior email. Gave pt the phone# and address of [facility]. Informed pt to call clinic to establish PCP.

## 2018-04-05 ENCOUNTER — Other Ambulatory Visit: Payer: Self-pay

## 2018-04-05 ENCOUNTER — Telehealth: Payer: Self-pay | Admitting: Physician Assistant

## 2018-04-05 ENCOUNTER — Encounter: Payer: Self-pay | Admitting: Physician Assistant

## 2018-04-05 ENCOUNTER — Ambulatory Visit (INDEPENDENT_AMBULATORY_CARE_PROVIDER_SITE_OTHER): Payer: BLUE CROSS/BLUE SHIELD | Admitting: Physician Assistant

## 2018-04-05 VITALS — BP 130/88 | HR 97 | Temp 97.9°F | Resp 20 | Ht 69.69 in | Wt 189.2 lb

## 2018-04-05 DIAGNOSIS — I10 Essential (primary) hypertension: Secondary | ICD-10-CM

## 2018-04-05 DIAGNOSIS — N401 Enlarged prostate with lower urinary tract symptoms: Secondary | ICD-10-CM | POA: Diagnosis not present

## 2018-04-05 DIAGNOSIS — R21 Rash and other nonspecific skin eruption: Secondary | ICD-10-CM

## 2018-04-05 DIAGNOSIS — N138 Other obstructive and reflux uropathy: Secondary | ICD-10-CM

## 2018-04-05 LAB — CMP14+EGFR
ALT: 23 IU/L (ref 0–44)
AST: 25 IU/L (ref 0–40)
Albumin/Globulin Ratio: 1.7 (ref 1.2–2.2)
Albumin: 4.9 g/dL (ref 3.5–5.5)
Alkaline Phosphatase: 53 IU/L (ref 39–117)
BUN/Creatinine Ratio: 14 (ref 9–20)
BUN: 17 mg/dL (ref 6–24)
Bilirubin Total: 0.7 mg/dL (ref 0.0–1.2)
CO2: 21 mmol/L (ref 20–29)
Calcium: 10 mg/dL (ref 8.7–10.2)
Chloride: 103 mmol/L (ref 96–106)
Creatinine, Ser: 1.2 mg/dL (ref 0.76–1.27)
GFR calc Af Amer: 82 mL/min/{1.73_m2} (ref >59)
GFR calc non Af Amer: 71 mL/min/{1.73_m2} (ref >59)
Globulin, Total: 2.9 g/dL (ref 1.5–4.5)
Glucose: 85 mg/dL (ref 65–99)
Potassium: 3.9 mmol/L (ref 3.5–5.2)
Sodium: 140 mmol/L (ref 134–144)
Total Protein: 7.8 g/dL (ref 6.0–8.5)

## 2018-04-05 LAB — POCT URINALYSIS DIP (MANUAL ENTRY)
Bilirubin, UA: NEGATIVE
Glucose, UA: NEGATIVE mg/dL
Ketones, POC UA: NEGATIVE mg/dL
Leukocytes, UA: NEGATIVE
Nitrite, UA: NEGATIVE
Protein Ur, POC: NEGATIVE mg/dL
Spec Grav, UA: 1.02 (ref 1.010–1.025)
Urobilinogen, UA: 0.2 E.U./dL
pH, UA: 6 (ref 5.0–8.0)

## 2018-04-05 MED ORDER — BLOOD PRESSURE KIT DEVI: 1.0000 | 0 refills | Status: DC

## 2018-04-05 MED ORDER — TAMSULOSIN HCL 0.4 MG PO CAPS: 0.4000 mg | ORAL_CAPSULE | Freq: Every day | ORAL | 1 refills | Status: AC

## 2018-04-05 NOTE — Patient Instructions (Addendum)
In terms of blood pressure, I would like you to check your blood pressure at least a couple times over the next two week outside of the office and document these values. It is best if you check the blood pressure at different times in the day. Your goal is <140/90. If your values are consistently above this goal, please return to office for further evaluation. If you start to have chest pain, blurred vision, shortness of breath, severe headache, lower leg swelling, or nausea/vomiting please seek care immediately here or at the ED. If your values remain below this value, continue with lifestyle modifications.   For BPH, continue flomax, I have placed referral to urology and they should call you in 2 weeks.  For rash, return if it comes back.  We will contact you with your kidney function results.  In the meantime, make sure you are well hydrated.      If you have lab work done today you will be contacted with your lab results within the next 2 weeks.  If you have not heard from [place] then please contact us. The fastest way to get your results is to register for My Chart.  Hypertension Hypertension, commonly called high blood pressure, is when the force of blood pumping through the arteries is too strong. The arteries are the blood vessels that carry blood from the heart throughout the body. Hypertension forces the heart to work harder to pump blood and may cause arteries to become narrow or stiff. Having untreated or uncontrolled hypertension can cause heart attacks, strokes, kidney disease, and other problems. A blood pressure reading consists of a higher number over a lower number. Ideally, your blood pressure should be below 120/80. The first ("top") number is called the systolic pressure. It is a measure of the pressure in your arteries as your heart beats. The second ("bottom") number is called the diastolic pressure. It is a measure of the pressure in your arteries as the heart relaxes. What are  the causes? The cause of this condition is not known. What increases the risk? Some risk factors for high blood pressure are under your control. Others are not. Factors you can change  Smoking.  Having type 2 diabetes mellitus, high cholesterol, or both.  Not getting enough exercise or physical activity.  Being overweight.  Having too much fat, sugar, calories, or salt (sodium) in your diet.  Drinking too much alcohol. Factors that are difficult or impossible to change  Having chronic kidney disease.  Having a family history of high blood pressure.  Age. Risk increases with age.  Race. You may be at higher risk if you are African-American.  Gender. Men are at higher risk than women before age 52. After age 71, women are at higher risk than men.  Having obstructive sleep apnea.  Stress. What are the signs or symptoms? Extremely high blood pressure (hypertensive crisis) may cause:  Headache.  Anxiety.  Shortness of breath.  Nosebleed.  Nausea and vomiting.  Severe chest pain.  Jerky movements you cannot control (seizures).  How is this diagnosed? This condition is diagnosed by measuring your blood pressure while you are seated, with your arm resting on a surface. The cuff of the blood pressure monitor will be placed directly against the skin of your upper arm at the level of your heart. It should be measured at least twice using the same arm. Certain conditions can cause a difference in blood pressure between your right and left arms. Certain  factors can cause blood pressure readings to be lower or higher than normal (elevated) for a short period of time:  When your blood pressure is higher when you are in a health care provider's office than when you are at home, this is called white coat hypertension. Most people with this condition do not need medicines.  When your blood pressure is higher at home than when you are in a health care provider's office, this is  called masked hypertension. Most people with this condition may need medicines to control blood pressure.  If you have a high blood pressure reading during one visit or you have normal blood pressure with other risk factors:  You may be asked to return on a different day to have your blood pressure checked again.  You may be asked to monitor your blood pressure at home for 1 week or longer.  If you are diagnosed with hypertension, you may have other blood or imaging tests to help your health care provider understand your overall risk for other conditions. How is this treated? This condition is treated by making healthy lifestyle changes, such as eating healthy foods, exercising more, and reducing your alcohol intake. Your health care provider may prescribe medicine if lifestyle changes are not enough to get your blood pressure under control, and if:  Your systolic blood pressure is above 130.  Your diastolic blood pressure is above 80.  Your personal target blood pressure may vary depending on your medical conditions, your age, and other factors. Follow these instructions at home: Eating and drinking  Eat a diet that is high in fiber and potassium, and low in sodium, added sugar, and fat. An example eating plan is called the DASH (Dietary Approaches to Stop Hypertension) diet. To eat this way: ? Eat plenty of fresh fruits and vegetables. Try to fill half of your plate at each meal with fruits and vegetables. ? Eat whole grains, such as whole wheat pasta, brown rice, or whole grain bread. Fill about one quarter of your plate with whole grains. ? Eat or drink low-fat dairy products, such as skim milk or low-fat yogurt. ? Avoid fatty cuts of meat, processed or cured meats, and poultry with skin. Fill about one quarter of your plate with lean proteins, such as fish, chicken without skin, beans, eggs, and tofu. ? Avoid premade and processed foods. These tend to be higher in sodium, added sugar,  and fat.  Reduce your daily sodium intake. Most people with hypertension should eat less than 1,500 mg of sodium a day.  Limit alcohol intake to no more than 1 drink a day for nonpregnant women and 2 drinks a day for men. One drink equals 12 oz of beer, 5 oz of wine, or 1 oz of hard liquor. Lifestyle  Work with your health care provider to maintain a healthy body weight or to lose weight. Ask what an ideal weight is for you.  Get at least 30 minutes of exercise that causes your heart to beat faster (aerobic exercise) most days of the week. Activities may include walking, swimming, or biking.  Include exercise to strengthen your muscles (resistance exercise), such as pilates or lifting weights, as part of your weekly exercise routine. Try to do these types of exercises for 30 minutes at least 3 days a week.  Do not use any products that contain nicotine or tobacco, such as cigarettes and e-cigarettes. If you need help quitting, ask your health care provider.  Monitor your blood  pressure at home as told by your health care provider.  Keep all follow-up visits as told by your health care provider. This is important. Medicines  Take over-the-counter and prescription medicines only as told by your health care provider. Follow directions carefully. Blood pressure medicines must be taken as prescribed.  Do not skip doses of blood pressure medicine. Doing this puts you at risk for problems and can make the medicine less effective.  Ask your health care provider about side effects or reactions to medicines that you should watch for. Contact a health care provider if:  You think you are having a reaction to a medicine you are taking.  You have headaches that keep coming back (recurring).  You feel dizzy.  You have swelling in your ankles.  You have trouble with your vision. Get help right away if:  You develop a severe headache or confusion.  You have unusual weakness or  numbness.  You feel faint.  You have severe pain in your chest or abdomen.  You vomit repeatedly.  You have trouble breathing. Summary  Hypertension is when the force of blood pumping through your arteries is too strong. If this condition is not controlled, it may put you at risk for serious complications.  Your personal target blood pressure may vary depending on your medical conditions, your age, and other factors. For most people, a normal blood pressure is less than 120/80.  Hypertension is treated with lifestyle changes, medicines, or a combination of both. Lifestyle changes include weight loss, eating a healthy, low-sodium diet, exercising more, and limiting alcohol. This information is not intended to replace advice given to you by your health care provider. Make sure you discuss any questions you have with your health care provider. Document Released: 05/18/2005 Document Revised: 04/15/2016 Document Reviewed: 04/15/2016 Elsevier Interactive Patient Education  2018 [employer].   IF you received an x-ray today, you will receive an invoice from [facility]. Please contact [facility] at [phone] with questions or concerns regarding your invoice.   IF you received labwork today, you will receive an invoice from [place]. Please contact [facility] at [phone] with questions or concerns regarding your invoice.   Our billing staff will not be able to assist you with questions regarding bills from these companies.  You will be contacted with the lab results as soon as they are available. The fastest way to get your results is to activate your My Chart account. Instructions are located on the last page of this paperwork. If you have not heard from [place] regarding the results in 2 weeks, please contact this office.

## 2018-04-05 NOTE — Progress Notes (Signed)
KRISTOFER SCHAFFERT  MRN: [id number] DOB: [birth year]  Subjective:  Benjamin [name] is a 47 y.o. male seen in office today for a chief complaint of rash and bp med refills.   -Noticed a rash on the sides of his face yesterday, took benedryl and it went away. Had some itching. Denies pain. Patient has not had contacts with similar rash. Patient has not had new exposures (soaps, lotions, laundry detergents, foods, medications, plants, insects or animals). Has never happened before. Not present today.   -HTN: Dx 5-6 years. Was taking bisoprolol-hctz 5-6.25mg daily. Has been out for a month. Not checking bp. Denies lightheadedness, dizziness, new headache, double vision, chest pain, shortness of breath, heart racing, palpitations, nausea, vomiting, abdominal pain, hematuria, lower leg swelling. Has not really changed anything with his lifestyle.   -BPH: Dx years ago. Was told the left side was larger. Normal PSA. Has been on flomax for years. Ran out a month ago. Notices a difference without medication since he has difficult time starting urinary stream and more nocturia.  Denies rectal pain, suprapubic pain, and erectile dysfunction.  He would like a referral to urology.  No PMH of diabetes or thyroid disease.   Denies smoking. Denies frequent NSAID use. Review of Systems  Patient Active Problem List   Diagnosis Date Noted  . Acute DVT of left tibial vein ([place]) 04/27/2017  . Chronic migraine without aura, intractable, with status migrainosus 04/09/2017  . New daily persistent headache 03/29/2017  . Maxillary sinusitis 03/29/2017  . Urinary frequency 09/02/2015  . Rash and nonspecific skin eruption 09/02/2015  . LLQ abdominal pain 08/09/2015  . History of diverticulitis 08/09/2015  . LLQ pain 08/06/2015  . Nausea without vomiting 08/06/2015  . Diverticulitis of colon 07/02/2015  . BMI 26.0-26.9,adult 09/17/2014  . Health maintenance examination 06/28/2014  . Adjustment disorder with  mixed anxiety and depressed mood 02/19/2014  . Chronic cluster headache, not intractable 02/19/2014  . Herpes labialis 05/17/2012  . BURSITIS, HIP 09/30/2009  . BACK PAIN, CHRONIC, INTERMITTENT 10/17/2008  . ALLERGIC RHINITIS 07/11/2008  . Benign prostatic hyperplasia with urinary obstruction 07/21/2007  . Essential hypertension 01/05/2007  . PSORIASIS 01/05/2007    Current Outpatient Medications on File Prior to Visit  Medication Sig Dispense Refill  . acyclovir ointment (ZOVIRAX) 5 % Apply to affected areas every 3 hours as needed 30 g 6  . ALPRAZolam (XANAX) 0.25 MG tablet Take 1-2 tabs 30-60 minutes before MRI may repeat if needed. Do not drive. (Patient not taking: Reported on 04/05/2018) 5 tablet 0  . bisoprolol-hydrochlorothiazide (ZIAC) 5-6.25 MG tablet TAKE 1 TABLET BY MOUTH DAILY. 90 tablet 1  . buPROPion (WELLBUTRIN XL) 300 MG 24 hr tablet Take 1 tablet (300 mg total) by mouth daily. 90 tablet 3  . ciprofloxacin (CIPRO) 500 MG tablet Take 1 tablet (500 mg total) by mouth 2 (two) times daily. (Patient not taking: Reported on 04/05/2018) 20 tablet 0  . fluconazole (DIFLUCAN) 150 MG tablet Take 1 tablet (150 mg total) by mouth once a week. (Patient not taking: Reported on 04/05/2018) 6 tablet 1  . fluticasone (FLONASE) 50 MCG/ACT nasal spray Place 2 sprays into both nostrils daily. 16 g 6  . HYDROcodone-acetaminophen (NORCO/VICODIN) 5-325 MG tablet Take 1 tablet by mouth every 6 (six) hours as needed for moderate pain. (Patient not taking: Reported on 04/05/2018) 20 tablet 0  . hyoscyamine (LEVSIN SL) 0.125 MG SL tablet Place 1 tablet (0.125 mg total) under the tongue every 4 (four)  hours as needed for cramping. 120 tablet 11  . metroNIDAZOLE (FLAGYL) 500 MG tablet Take 1 tablet (500 mg total) by mouth 3 (three) times daily. One po bid x 7 days (Patient not taking: Reported on 04/05/2018) 30 tablet 0  . ondansetron (ZOFRAN ODT) 4 MG disintegrating tablet 28m ODT q4 hours prn nausea/vomit 10  tablet 0  . ondansetron (ZOFRAN) 8 MG tablet Take 0.5-1 tablets (4-8 mg total) by mouth every 8 (eight) hours as needed for nausea or vomiting. 30 tablet 0  . predniSONE (DELTASONE) 50 MG tablet Take 1 tab daily in the morning. (Patient not taking: Reported on 04/05/2018) 5 tablet 0  . RABEprazole (ACIPHEX) 20 MG tablet Take 1 tablet (20 mg total) by mouth daily. 90 tablet 3  . rizatriptan (MAXALT-MLT) 10 MG disintegrating tablet Take 1 tablet (10 mg total) as needed by mouth for migraine. May repeat in 2 hours if needed 10 tablet 0  . rosuvastatin (CRESTOR) 40 MG tablet Take 1 tablet (40 mg total) by mouth once a week. 4 tablet 12  . tiZANidine (ZANAFLEX) 4 MG tablet Take 1 tablet (4 mg total) every 6 (six) hours as needed by mouth for muscle spasms. Or for headaches. 60 tablet 6  . Topiramate ER (TROKENDI XR) 25 MG CP24 Take 50 mg by mouth daily. 90 capsule 3  . traMADol (ULTRAM) 50 MG tablet Take 1-1.5 tablets (50-75 mg total) by mouth every 6 (six) hours as needed for severe pain. 45 tablet 0  . valACYclovir (VALTREX) 500 MG tablet Take 1 tablet (500 mg total) by mouth daily. 30 tablet 12  . XARELTO 20 MG TABS tablet TAKE 1 TABLET (20 MG TOTAL) BY MOUTH DAILY WITH SUPPER. 90 tablet 0  . XARELTO 20 MG TABS tablet TAKE 1 TABLET (20 MG TOTAL) BY MOUTH DAILY WITH SUPPER. 90 tablet 0  . [DISCONTINUED] lisinopril-hydrochlorothiazide (PRINZIDE,ZESTORETIC) 20-25 MG per tablet Take 1 tablet by mouth daily. 90 tablet 3   Current Facility-Administered Medications on File Prior to Visit  Medication Dose Route Frequency Provider Last Rate Last Dose  . gadopentetate dimeglumine (MAGNEVIST) injection 18 mL  18 mL Intravenous Once PRN AMelvenia Beam MD        Allergies  Allergen Reactions  . Compazine [Prochlorperazine Edisylate] Other (See Comments)    Cervical dystonia and panic attack  . Pepto-Bismol [Bismuth Subsalicylate] Swelling     Objective:  BP 130/88   Pulse 97   Temp 97.9 F (36.6 C)  (Oral)   Resp 20   Ht 5' 9.69" (1.77 m)   Wt 189 lb 3.2 oz (85.8 kg)   SpO2 98%   BMI 27.39 kg/m   Physical Exam  Constitutional: He is oriented to person, place, and time. He appears well-developed and well-nourished. No distress.  HENT:  Head: Normocephalic and atraumatic.  Mouth/Throat: Uvula is midline, oropharynx is clear and moist and mucous membranes are normal. No tonsillar exudate.  Eyes: Pupils are equal, round, and reactive to light. Conjunctivae and EOM are normal.  Neck: Normal range of motion.  Cardiovascular: Normal rate, regular rhythm, normal heart sounds and intact distal pulses.  Pulmonary/Chest: Effort normal and breath sounds normal. He has no decreased breath sounds. He has no wheezes. He has no rhonchi. He has no rales.  Musculoskeletal:       Right lower leg: He exhibits no swelling.       Left lower leg: He exhibits no swelling.  Neurological: He is alert and oriented to person,  place, and time.  Skin: Skin is warm and dry. No rash noted.  Psychiatric: He has a normal mood and affect.  Vitals reviewed.    BP Readings from Last 3 Encounters:  04/05/18 130/88  02/08/18 128/83  02/08/18 134/81   Results for orders placed or performed in visit on 04/05/18 (from the past 24 hour(s))  POCT urinalysis dipstick     Status: Abnormal   Collection Time: 04/05/18 11:02 AM  Result Value Ref Range   Color, UA yellow yellow   Clarity, UA clear clear   Glucose, UA negative negative mg/dL   Bilirubin, UA negative negative   Ketones, POC UA negative negative mg/dL   Spec Grav, UA 1.020 1.010 - 1.025   Blood, UA trace-intact (A) negative   pH, UA 6.0 5.0 - 8.0   Protein Ur, POC negative negative mg/dL   Urobilinogen, UA 0.2 0.2 or 1.0 E.U./dL   Nitrite, UA Negative Negative   Leukocytes, UA Negative Negative    Assessment and Plan :  1. Rash and nonspecific skin eruption Resolved. Reassuring that it resolved with Benadryl. Not present today.  Suggest  following up if rash reappears or taking a picture next time it is present for further evaluation and management.   2. Essential hypertension Well controlled in office today.  He has not had blood pressure medication in 1 month.  He has not been checking blood pressures outside the office.  He is asymptomatic.  It may be that he can control blood pressure via lifestyle modifications at this time.  Suggest holding Rx for blood pressure at this time and monitoring outside the office.  Patient given prescription for home blood pressure monitor.  Encouraged to check blood pressure outside the office a few times each week and document these values.  Goal is less than 140/90.  If consistently above this goal will return to office for further evaluation.  Otherwise, follow-up as needed. - CMP14+EGFR - POCT urinalysis dipstick - Blood Pressure Monitoring (BLOOD PRESSURE KIT) DEVI; 1 Device by Does not apply route once a week.  Dispense: 1 Device; Refill: 0 - POCT Microscopic Urinalysis ([facility])  3. Benign prostatic hyperplasia with urinary obstruction Referral to urology placed.  Refills for Flomax provided. - Ambulatory referral to Urology - tamsulosin (FLOMAX) 0.4 MG CAPS capsule; Take 1 capsule (0.4 mg total) by mouth daily.  Dispense: 90 capsule; Refill: [place] PA-C  [facility] 04/05/2018 9:20 PM

## 2018-04-05 NOTE — Telephone Encounter (Signed)
Please call patient.  UA showed trace blood. I ordered a point-of-care urine microscopy for further evaluation but it was not collected/resulted so we will need to have this recollected. He can come to the office any time in the next couple week to have this done. Thanks!

## 2018-04-06 NOTE — Telephone Encounter (Signed)
Detailed message left

## 2018-04-11 ENCOUNTER — Inpatient Hospital Stay (HOSPITAL_BASED_OUTPATIENT_CLINIC_OR_DEPARTMENT_OTHER): Payer: BLUE CROSS/BLUE SHIELD | Admitting: Family

## 2018-04-11 ENCOUNTER — Encounter: Payer: Self-pay | Admitting: Family

## 2018-04-11 ENCOUNTER — Other Ambulatory Visit: Payer: Self-pay

## 2018-04-11 ENCOUNTER — Telehealth: Payer: Self-pay | Admitting: Family

## 2018-04-11 ENCOUNTER — Inpatient Hospital Stay: Payer: BLUE CROSS/BLUE SHIELD | Attending: Family

## 2018-04-11 VITALS — BP 149/103 | HR 86 | Temp 98.2°F | Resp 16 | Wt 188.0 lb

## 2018-04-11 DIAGNOSIS — R760 Raised antibody titer: Secondary | ICD-10-CM

## 2018-04-11 DIAGNOSIS — I10 Essential (primary) hypertension: Secondary | ICD-10-CM | POA: Diagnosis not present

## 2018-04-11 DIAGNOSIS — Z7982 Long term (current) use of aspirin: Secondary | ICD-10-CM

## 2018-04-11 DIAGNOSIS — D6859 Other primary thrombophilia: Secondary | ICD-10-CM

## 2018-04-11 DIAGNOSIS — Z86718 Personal history of other venous thrombosis and embolism: Secondary | ICD-10-CM | POA: Diagnosis not present

## 2018-04-11 DIAGNOSIS — Z79899 Other long term (current) drug therapy: Secondary | ICD-10-CM | POA: Insufficient documentation

## 2018-04-11 DIAGNOSIS — Z7901 Long term (current) use of anticoagulants: Secondary | ICD-10-CM | POA: Diagnosis not present

## 2018-04-11 DIAGNOSIS — D6862 Lupus anticoagulant syndrome: Secondary | ICD-10-CM

## 2018-04-11 DIAGNOSIS — I82442 Acute embolism and thrombosis of left tibial vein: Secondary | ICD-10-CM

## 2018-04-11 DIAGNOSIS — D6851 Activated protein C resistance: Secondary | ICD-10-CM

## 2018-04-11 DIAGNOSIS — I825Z2 Chronic embolism and thrombosis of unspecified deep veins of left distal lower extremity: Secondary | ICD-10-CM | POA: Diagnosis not present

## 2018-04-11 LAB — CMP (CANCER CENTER ONLY)
ALT: 26 U/L (ref 0–44)
AST: 24 U/L (ref 15–41)
Albumin: 4.5 g/dL (ref 3.5–5.0)
Alkaline Phosphatase: 63 U/L (ref 38–126)
Anion gap: 8 (ref 5–15)
BUN: 17 mg/dL (ref 6–20)
CO2: 24 mmol/L (ref 22–32)
Calcium: 9.8 mg/dL (ref 8.9–10.3)
Chloride: 108 mmol/L (ref 98–111)
Creatinine: 1.44 mg/dL — ABNORMAL HIGH (ref 0.61–1.24)
GFR, Est AFR Am: >60 mL/min (ref >60)
GFR, Estimated: 56 mL/min — ABNORMAL LOW (ref >60)
Glucose, Bld: 96 mg/dL (ref 70–99)
Potassium: 4.3 mmol/L (ref 3.5–5.1)
Sodium: 140 mmol/L (ref 135–145)
Total Bilirubin: 0.7 mg/dL (ref 0.3–1.2)
Total Protein: 8.1 g/dL (ref 6.5–8.1)

## 2018-04-11 LAB — CBC WITH DIFFERENTIAL (CANCER CENTER ONLY)
Abs Immature Granulocytes: 0.04 10*3/uL (ref 0.00–0.07)
Basophils Absolute: 0.1 10*3/uL (ref 0.0–0.1)
Basophils Relative: 1 %
Eosinophils Absolute: 0.2 10*3/uL (ref 0.0–0.5)
Eosinophils Relative: 3 %
HCT: 46.4 % (ref 39.0–52.0)
Hemoglobin: 15.6 g/dL (ref 13.0–17.0)
Immature Granulocytes: 1 %
Lymphocytes Relative: 37 %
Lymphs Abs: 2.5 10*3/uL (ref 0.7–4.0)
MCH: 30.4 pg (ref 26.0–34.0)
MCHC: 33.6 g/dL (ref 30.0–36.0)
MCV: 90.3 fL (ref 80.0–100.0)
Monocytes Absolute: 0.5 10*3/uL (ref 0.1–1.0)
Monocytes Relative: 7 %
Neutro Abs: 3.4 10*3/uL (ref 1.7–7.7)
Neutrophils Relative %: 51 %
Platelet Count: 309 10*3/uL (ref 150–400)
RBC: 5.14 MIL/uL (ref 4.22–5.81)
RDW: 12.4 % (ref 11.5–15.5)
WBC Count: 6.7 10*3/uL (ref 4.0–10.5)
nRBC: 0 % (ref 0.0–0.2)

## 2018-04-11 LAB — D-DIMER, QUANTITATIVE: D-Dimer, Quant: <0.27 ug/mL-FEU (ref 0.00–0.50)

## 2018-04-11 MED ORDER — RIVAROXABAN 10 MG PO TABS: ORAL_TABLET | ORAL | 6 refills | Status: DC

## 2018-04-11 NOTE — Telephone Encounter (Signed)
Appts scheduled avs/calendar declined due to my chart per 11/11 los

## 2018-04-11 NOTE — Progress Notes (Signed)
Hematology and Oncology Follow Up Visit  BREVAN LUBERTO [id number] [birth year] 47 y.o. 04/11/2018   Principle Diagnosis:  Heterozygous for the Factor V Leiden R506Q mutation Acute DVT of the left posterior tibial vein Acute superficial thombosis of the small saphenous vein Positive lupus anticoagulant  Current Therapy:   Xarelto 20 mg PO daily   Interim History:  [name] is here today for follow-up. He is doing fairly well bit has been having some issues with HTN. He was also told that he has kidney disease and has been referred to nephrology. He has not seen them yet.  He states that he had a little blood in his urine with recent tests.  No other bleeding, no bruising or petechiae.  He has had some mild SOB with over exertion.  He had a recent flare with diverticulitis and was treated with an antibiotic and pain medicine as well as fluids.  No fever, chills, n/v, cough, rash, dizziness, headache,blurred vision, chest pain, palpitations, abdominal pain or changes in bowel or bladder habits.  No swelling, tenderness, numbness or tingling in his extremities.  He has maintained a good appetite and is staying well hydrated. His weight is stable.   ECOG Performance Status: 1 - Symptomatic but completely ambulatory  Medications:  Allergies as of 04/11/2018      Reactions   Compazine [prochlorperazine Edisylate] Other (See Comments)   Cervical dystonia and panic attack   Pepto-bismol [bismuth Subsalicylate] Swelling      Medication List        Accurate as of 04/11/18 11:00 AM. Always use your most recent med list.          acyclovir ointment 5 % Commonly known as:  ZOVIRAX Apply to affected areas every 3 hours as needed   ALPRAZolam 0.25 MG tablet Commonly known as:  XANAX Take 1-2 tabs 30-60 minutes before MRI may repeat if needed. Do not drive.   buPROPion 300 MG 24 hr tablet Commonly known as:  WELLBUTRIN XL Take 1 tablet (300 mg total) by mouth daily.     fluticasone 50 MCG/ACT nasal spray Commonly known as:  FLONASE Place 2 sprays into both nostrils daily.   hyoscyamine 0.125 MG SL tablet Commonly known as:  LEVSIN SL Place 1 tablet (0.125 mg total) under the tongue every 4 (four) hours as needed for cramping.   ondansetron 4 MG disintegrating tablet Commonly known as:  ZOFRAN-ODT 4mg  ODT q4 hours prn nausea/vomit   RABEprazole 20 MG tablet Commonly known as:  ACIPHEX Take 1 tablet (20 mg total) by mouth daily.   rizatriptan 10 MG disintegrating tablet Commonly known as:  MAXALT-MLT Take 1 tablet (10 mg total) as needed by mouth for migraine. May repeat in 2 hours if needed   rosuvastatin 40 MG tablet Commonly known as:  CRESTOR Take 1 tablet (40 mg total) by mouth once a week.   tamsulosin 0.4 MG Caps capsule Commonly known as:  FLOMAX Take 1 capsule (0.4 mg total) by mouth daily.   tiZANidine 4 MG tablet Commonly known as:  ZANAFLEX Take 1 tablet (4 mg total) every 6 (six) hours as needed by mouth for muscle spasms. Or for headaches.   Topiramate ER 25 MG Cp24 Commonly known as:  TROKENDI XR Take 50 mg by mouth daily.   valACYclovir 500 MG tablet Commonly known as:  VALTREX Take 1 tablet (500 mg total) by mouth daily.   XARELTO 20 MG Tabs tablet Generic drug:  rivaroxaban TAKE 1 TABLET (20  MG TOTAL) BY MOUTH DAILY WITH SUPPER.       Allergies:  Allergies  Allergen Reactions  . Compazine [Prochlorperazine Edisylate] Other (See Comments)    Cervical dystonia and panic attack  . Pepto-Bismol [Bismuth Subsalicylate] Swelling    Past Medical History, Surgical history, Social history, and Family History were reviewed and updated.  Review of Systems: All other 10 point review of systems is negative.   Physical Exam:  weight is 188 lb (85.3 kg). His oral temperature is 98.2 F (36.8 C). His blood pressure is 149/103 (abnormal) and his pulse is 86. His respiration is 16 and oxygen saturation is 100%.   Wt  Readings from Last 3 Encounters:  04/11/18 188 lb (85.3 kg)  04/05/18 189 lb 3.2 oz (85.8 kg)  02/08/18 188 lb (85.3 kg)    Ocular: Sclerae unicteric, pupils equal, round and reactive to light Ear-nose-throat: Oropharynx clear, dentition fair Lymphatic: No cervical, supraclavicular or axillary adenopathy Lungs no rales or rhonchi, good excursion bilaterally Heart regular rate and rhythm, no murmur appreciated Abd soft, nontender, positive bowel sounds, no liver or spleen tip palpated on exam, no fluid wave  MSK no focal spinal tenderness, no joint edema Neuro: non-focal, well-oriented, appropriate affect Breasts: Deferred   Lab Results  Component Value Date   WBC 6.7 04/11/2018   HGB 15.6 04/11/2018   HCT 46.4 04/11/2018   MCV 90.3 04/11/2018   PLT 309 04/11/2018   Lab Results  Component Value Date   FERRITIN 111 05/24/2017   IRON 65 05/24/2017   TIBC 361 05/24/2017   UIBC 296 05/24/2017   IRONPCTSAT 18 (L) 05/24/2017   Lab Results  Component Value Date   RBC 5.14 04/11/2018   No results found for: KPAFRELGTCHN, LAMBDASER, KAPLAMBRATIO No results found for: IGGSERUM, IGA, IGMSERUM No results found for: Odetta Pink, SPEI   Chemistry      Component Value Date/Time   NA 140 04/05/2018 1119   NA 141 05/24/2017 0845   K 3.9 04/05/2018 1119   K 3.6 05/24/2017 0845   CL 103 04/05/2018 1119   CL 106 05/24/2017 0845   CO2 21 04/05/2018 1119   CO2 26 05/24/2017 0845   BUN 17 04/05/2018 1119   BUN 17 05/24/2017 0845   CREATININE 1.20 04/05/2018 1119   CREATININE 1.50 (H) 01/03/2018 1041   CREATININE 1.4 (H) 05/24/2017 0845      Component Value Date/Time   CALCIUM 10.0 04/05/2018 1119   CALCIUM 9.0 05/24/2017 0845   ALKPHOS 53 04/05/2018 1119   ALKPHOS 66 05/24/2017 0845   AST 25 04/05/2018 1119   AST 25 01/03/2018 1041   ALT 23 04/05/2018 1119   ALT 26 01/03/2018 1041   ALT 35 05/24/2017 0845   BILITOT 0.7  04/05/2018 1119   BILITOT 0.8 01/03/2018 1041      Impression and Plan: 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  Lupus anticoagulant positive.  He has completed a year of full dose anticoagulation with Xarelto and will now go to maintenance dosing of 10 mg PO daily.  We will also have his start taking 1 enteric baby aspirin daily. He will take this with food.  We will plan to see him back in another 3 months.  He will contact our office with  any questions or concerns. We can certainly see him sooner if need be.   Laverna Peace, NP 11/11/201911:00 AM

## 2018-04-13 LAB — LUPUS ANTICOAGULANT PANEL
DRVVT: 136.5 s — ABNORMAL HIGH (ref 0.0–47.0)
PTT Lupus Anticoagulant: 51.1 s (ref 0.0–51.9)

## 2018-04-13 LAB — DRVVT CONFIRM: dRVVT Confirm: 2.4 ratio — ABNORMAL HIGH (ref 0.8–1.2)

## 2018-04-13 LAB — DRVVT MIX: dRVVT Mix: 83 s — ABNORMAL HIGH (ref 0.0–47.0)

## 2018-04-20 ENCOUNTER — Encounter: Payer: Self-pay | Admitting: Family

## 2018-04-20 DIAGNOSIS — R21 Rash and other nonspecific skin eruption: Secondary | ICD-10-CM | POA: Diagnosis not present

## 2018-04-22 DIAGNOSIS — M310 Hypersensitivity angiitis: Secondary | ICD-10-CM | POA: Diagnosis not present

## 2018-04-22 DIAGNOSIS — L709 Acne, unspecified: Secondary | ICD-10-CM | POA: Diagnosis not present

## 2018-04-25 DIAGNOSIS — M310 Hypersensitivity angiitis: Secondary | ICD-10-CM | POA: Diagnosis not present

## 2018-05-01 ENCOUNTER — Other Ambulatory Visit: Payer: Self-pay | Admitting: Physician Assistant

## 2018-05-02 NOTE — Telephone Encounter (Signed)
Patient called, left VM to return call to schedule an appointment with a provider, if he chooses to stay at [facility]. Requested medication was discontinued by provider on 04/05/18 OV and noted to check BP's at home, return to the office if BP's are higher than 140/90.

## 2018-05-05 ENCOUNTER — Encounter: Payer: Self-pay | Admitting: Physician Assistant

## 2018-05-09 NOTE — Telephone Encounter (Signed)
Please schedule patient to review/discuss Please let him know that Benjamin [name] has left the practice since their last visit. Any provider accepting new patients will be ok. thanks

## 2018-05-10 NOTE — Telephone Encounter (Signed)
Called and spoke with pt regarding making an appt to establish care. I was able to make an appt with Dr. Pamella Pert on 06/14/18 at 10:40. I advised pt of time, building number and late policy. Pt acknowledged.

## 2018-05-13 ENCOUNTER — Other Ambulatory Visit: Payer: Self-pay | Admitting: Family Medicine

## 2018-05-13 DIAGNOSIS — R7989 Other specified abnormal findings of blood chemistry: Secondary | ICD-10-CM

## 2018-05-19 DIAGNOSIS — L708 Other acne: Secondary | ICD-10-CM | POA: Diagnosis not present

## 2018-05-19 DIAGNOSIS — L986 Other infiltrative disorders of the skin and subcutaneous tissue: Secondary | ICD-10-CM | POA: Diagnosis not present

## 2018-05-19 DIAGNOSIS — Z79899 Other long term (current) drug therapy: Secondary | ICD-10-CM | POA: Diagnosis not present

## 2018-05-19 DIAGNOSIS — M310 Hypersensitivity angiitis: Secondary | ICD-10-CM | POA: Diagnosis not present

## 2018-05-19 DIAGNOSIS — L958 Other vasculitis limited to the skin: Secondary | ICD-10-CM | POA: Diagnosis not present

## 2018-05-24 DIAGNOSIS — I776 Arteritis, unspecified: Secondary | ICD-10-CM | POA: Diagnosis not present

## 2018-05-24 DIAGNOSIS — N509 Disorder of male genital organs, unspecified: Secondary | ICD-10-CM | POA: Diagnosis not present

## 2018-05-24 DIAGNOSIS — R944 Abnormal results of kidney function studies: Secondary | ICD-10-CM | POA: Diagnosis not present

## 2018-05-26 ENCOUNTER — Other Ambulatory Visit: Payer: Self-pay | Admitting: Physician Assistant

## 2018-05-26 ENCOUNTER — Other Ambulatory Visit (HOSPITAL_BASED_OUTPATIENT_CLINIC_OR_DEPARTMENT_OTHER): Payer: Self-pay | Admitting: Physician Assistant

## 2018-05-26 ENCOUNTER — Other Ambulatory Visit: Payer: Self-pay | Admitting: *Deleted

## 2018-05-26 DIAGNOSIS — I776 Arteritis, unspecified: Secondary | ICD-10-CM

## 2018-05-27 ENCOUNTER — Ambulatory Visit (HOSPITAL_BASED_OUTPATIENT_CLINIC_OR_DEPARTMENT_OTHER)
Admission: RE | Admit: 2018-05-27 | Discharge: 2018-05-27 | Disposition: A | Discharge status: Home / Self Care | Payer: BLUE CROSS/BLUE SHIELD | Source: Ambulatory Visit | Attending: Physician Assistant | Admitting: Physician Assistant

## 2018-05-27 DIAGNOSIS — K5730 Diverticulosis of large intestine without perforation or abscess without bleeding: Secondary | ICD-10-CM | POA: Diagnosis not present

## 2018-05-27 DIAGNOSIS — I776 Arteritis, unspecified: Secondary | ICD-10-CM | POA: Insufficient documentation

## 2018-05-27 MED ORDER — IOPAMIDOL (ISOVUE-300) INJECTION 61%
100.0000 mL | Freq: Once | INTRAVENOUS | Status: AC | PRN
  Administered 2018-05-27: 100 mL via INTRAVENOUS

## 2018-06-10 DIAGNOSIS — I776 Arteritis, unspecified: Secondary | ICD-10-CM | POA: Diagnosis not present

## 2018-06-10 DIAGNOSIS — R809 Proteinuria, unspecified: Secondary | ICD-10-CM | POA: Diagnosis not present

## 2018-06-13 DIAGNOSIS — L817 Pigmented purpuric dermatosis: Secondary | ICD-10-CM | POA: Diagnosis not present

## 2018-06-14 ENCOUNTER — Ambulatory Visit: Payer: BLUE CROSS/BLUE SHIELD | Admitting: Family Medicine

## 2018-06-29 DIAGNOSIS — D690 Allergic purpura: Secondary | ICD-10-CM | POA: Diagnosis not present

## 2018-06-29 DIAGNOSIS — I10 Essential (primary) hypertension: Secondary | ICD-10-CM | POA: Diagnosis not present

## 2018-07-01 DIAGNOSIS — R351 Nocturia: Secondary | ICD-10-CM | POA: Diagnosis not present

## 2018-07-01 DIAGNOSIS — N401 Enlarged prostate with lower urinary tract symptoms: Secondary | ICD-10-CM | POA: Diagnosis not present

## 2018-07-01 DIAGNOSIS — N5314 Retrograde ejaculation: Secondary | ICD-10-CM | POA: Diagnosis not present

## 2018-07-11 ENCOUNTER — Ambulatory Visit: Payer: BLUE CROSS/BLUE SHIELD | Admitting: Hematology & Oncology

## 2018-07-11 ENCOUNTER — Other Ambulatory Visit: Payer: BLUE CROSS/BLUE SHIELD

## 2018-07-28 ENCOUNTER — Ambulatory Visit: Payer: BLUE CROSS/BLUE SHIELD | Admitting: Hematology & Oncology

## 2018-07-28 ENCOUNTER — Other Ambulatory Visit: Payer: BLUE CROSS/BLUE SHIELD

## 2018-08-12 DIAGNOSIS — R6889 Other general symptoms and signs: Secondary | ICD-10-CM | POA: Diagnosis not present

## 2018-09-26 DIAGNOSIS — M5441 Lumbago with sciatica, right side: Secondary | ICD-10-CM | POA: Diagnosis not present

## 2018-10-17 ENCOUNTER — Other Ambulatory Visit: Payer: Self-pay | Admitting: Physician Assistant

## 2018-10-17 DIAGNOSIS — R634 Abnormal weight loss: Secondary | ICD-10-CM

## 2018-11-14 ENCOUNTER — Other Ambulatory Visit: Payer: Self-pay | Admitting: Family Medicine

## 2018-11-14 DIAGNOSIS — R634 Abnormal weight loss: Secondary | ICD-10-CM

## 2018-11-23 DIAGNOSIS — M5441 Lumbago with sciatica, right side: Secondary | ICD-10-CM | POA: Diagnosis not present

## 2018-11-23 DIAGNOSIS — G8929 Other chronic pain: Secondary | ICD-10-CM | POA: Diagnosis not present

## 2018-11-23 DIAGNOSIS — R21 Rash and other nonspecific skin eruption: Secondary | ICD-10-CM | POA: Diagnosis not present

## 2018-11-24 ENCOUNTER — Other Ambulatory Visit: Payer: Self-pay | Admitting: Physician Assistant

## 2018-11-24 DIAGNOSIS — Z0000 Encounter for general adult medical examination without abnormal findings: Secondary | ICD-10-CM

## 2018-12-09 ENCOUNTER — Other Ambulatory Visit: Payer: Self-pay | Admitting: Physician Assistant

## 2018-12-09 DIAGNOSIS — E785 Hyperlipidemia, unspecified: Secondary | ICD-10-CM

## 2018-12-09 NOTE — Telephone Encounter (Signed)
Patient needs an OV for refills and to establish care with a new provider.

## 2019-02-18 ENCOUNTER — Other Ambulatory Visit: Payer: Self-pay | Admitting: Physician Assistant

## 2019-02-20 NOTE — Telephone Encounter (Signed)
Requested medication (s) are due for refill today: yes  Requested medication (s) are on the active medication list: yes  Last refill:  11/10/2018  Future visit scheduled: no  Notes to clinic:  Review for refill  Requested Prescriptions  Pending Prescriptions Disp Refills   buPROPion (WELLBUTRIN XL) 300 MG 24 hr tablet [Pharmacy Med Name: BUPROPION HCL XL 300 MG TABLET] 90 tablet 3    Sig: TAKE 1 [place]     Psychiatry: Antidepressants - bupropion Failed - 02/18/2019  9:54 AM      Failed - Last BP in normal range    BP Readings from Last 1 Encounters:  04/11/18 (!) 149/103         Failed - Valid encounter within last 6 months    Recent Outpatient Visits          10 months ago Rash and nonspecific skin eruption   [facility] [place], [place] D, PA-C   1 year ago Diverticulitis of colon   [facility] [place], [place]   1 year ago Acute left-sided thoracic back pain   [facility] [place], Audrie Lia, PA-C   1 year ago Gastroenteritis   [facility] [place], PA-C   1 year ago Acute bacterial sinusitis   [facility] [place], PA-C             Failed - Completed PHQ-2 or PHQ-9 in the last 360 days.

## 2019-03-21 DIAGNOSIS — G8929 Other chronic pain: Secondary | ICD-10-CM | POA: Diagnosis not present

## 2019-03-21 DIAGNOSIS — Z23 Encounter for immunization: Secondary | ICD-10-CM | POA: Diagnosis not present

## 2019-03-21 DIAGNOSIS — M5441 Lumbago with sciatica, right side: Secondary | ICD-10-CM | POA: Diagnosis not present

## 2019-03-21 DIAGNOSIS — R10812 Left upper quadrant abdominal tenderness: Secondary | ICD-10-CM | POA: Diagnosis not present

## 2019-03-21 DIAGNOSIS — R21 Rash and other nonspecific skin eruption: Secondary | ICD-10-CM | POA: Diagnosis not present

## 2019-04-14 DIAGNOSIS — D1721 Benign lipomatous neoplasm of skin and subcutaneous tissue of right arm: Secondary | ICD-10-CM | POA: Diagnosis not present

## 2019-04-14 DIAGNOSIS — M6740 Ganglion, unspecified site: Secondary | ICD-10-CM | POA: Diagnosis not present

## 2019-04-14 DIAGNOSIS — D690 Allergic purpura: Secondary | ICD-10-CM | POA: Diagnosis not present

## 2019-04-18 IMAGING — DX DG SINUSES COMPLETE 3+V
3 series · 3 of 3 positions shown · non-contrast
Comparison: 06/04/2005 CT.

CLINICAL DATA: 46-year-old male with sinus congestion for months.
Initial encounter.

EXAM:
PARANASAL SINUSES - COMPLETE 3 + VIEW

[pns waters]
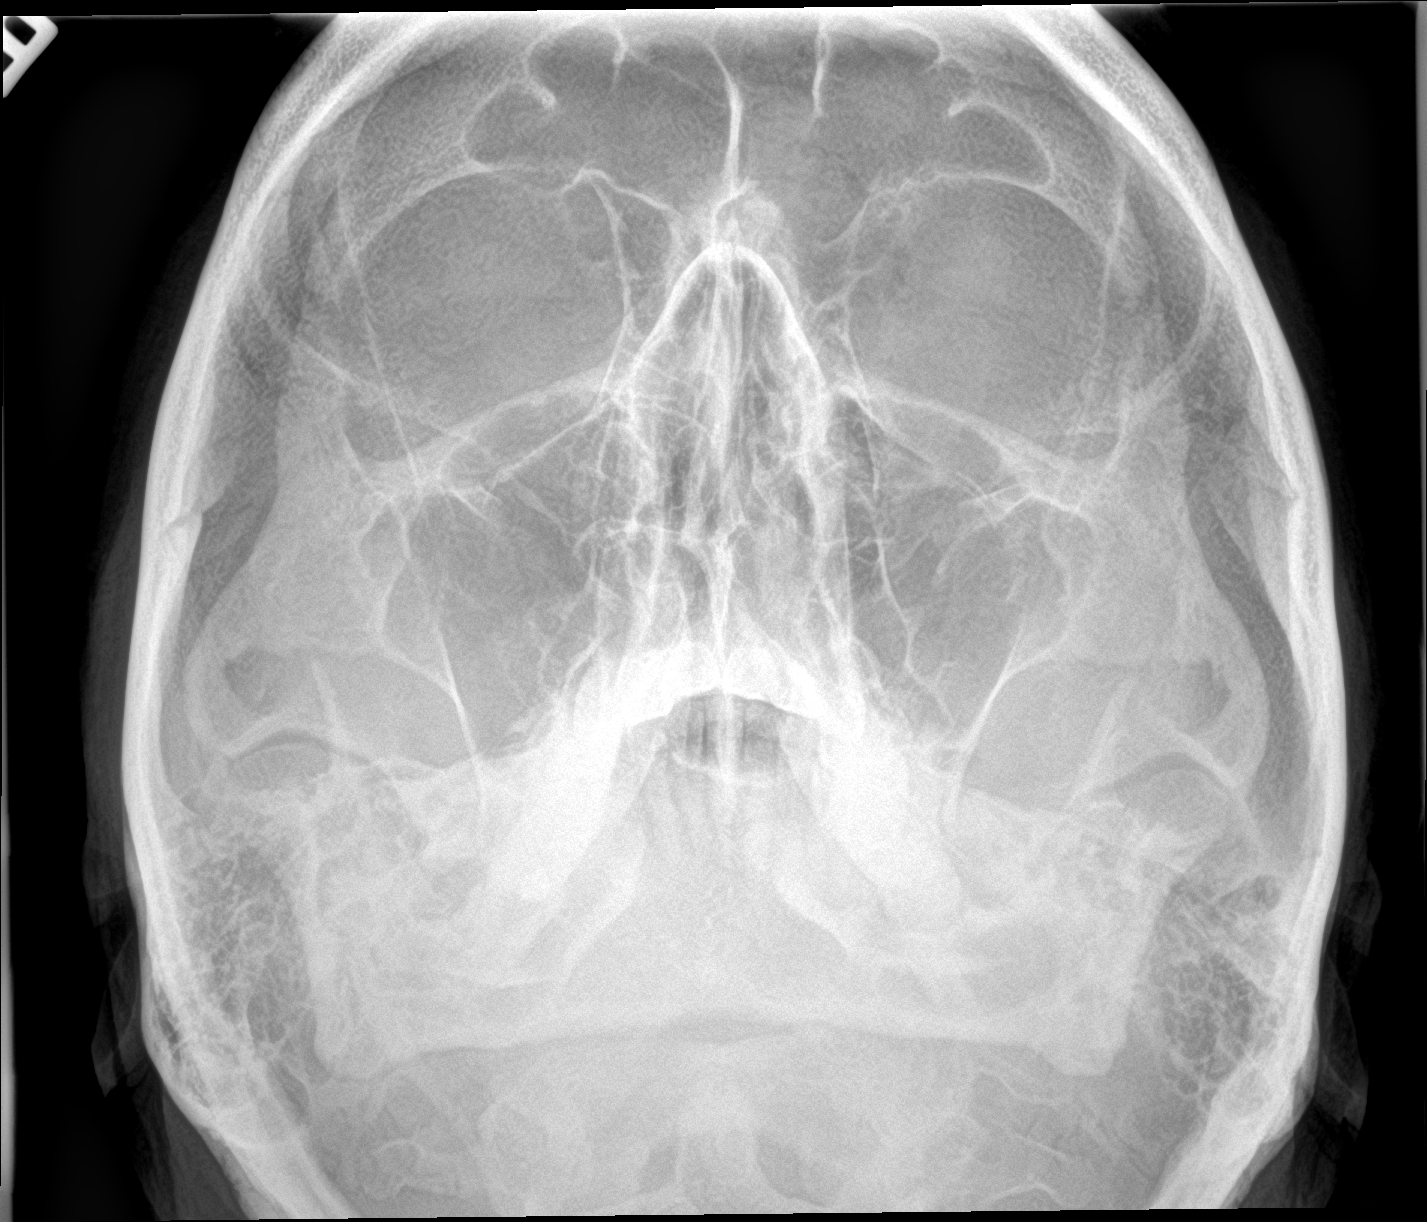

[[person_name]]
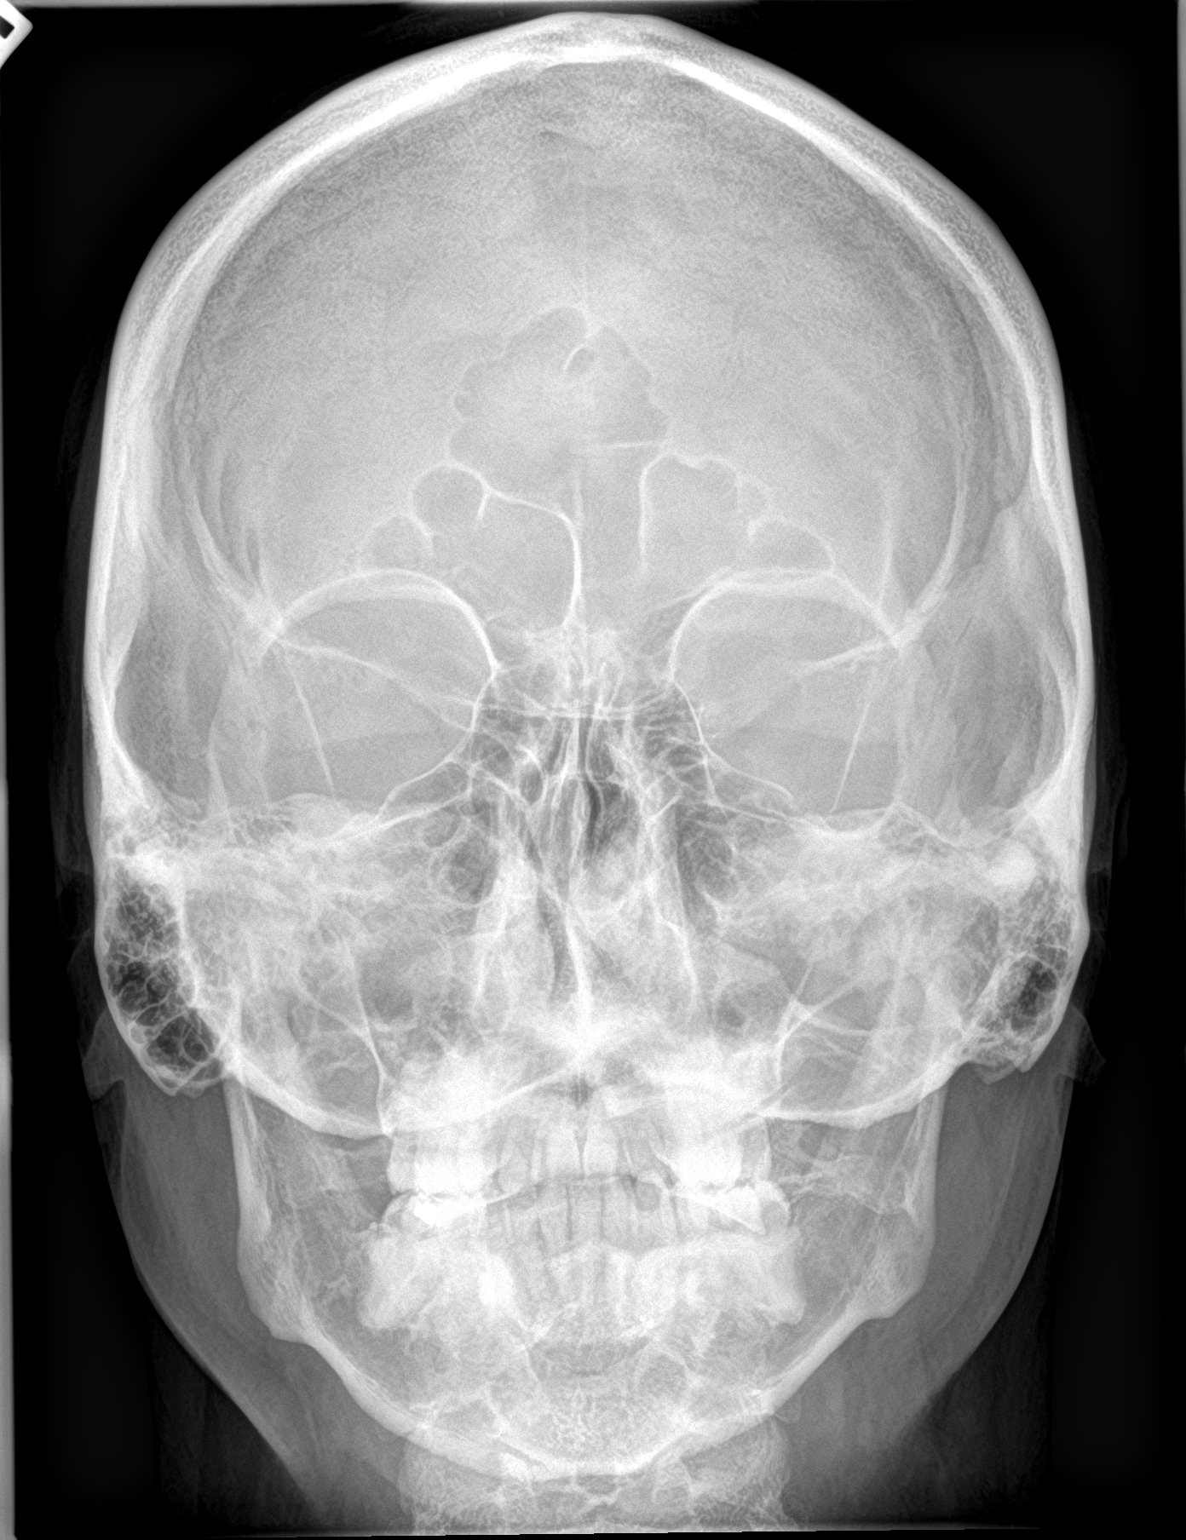

[pns lat]
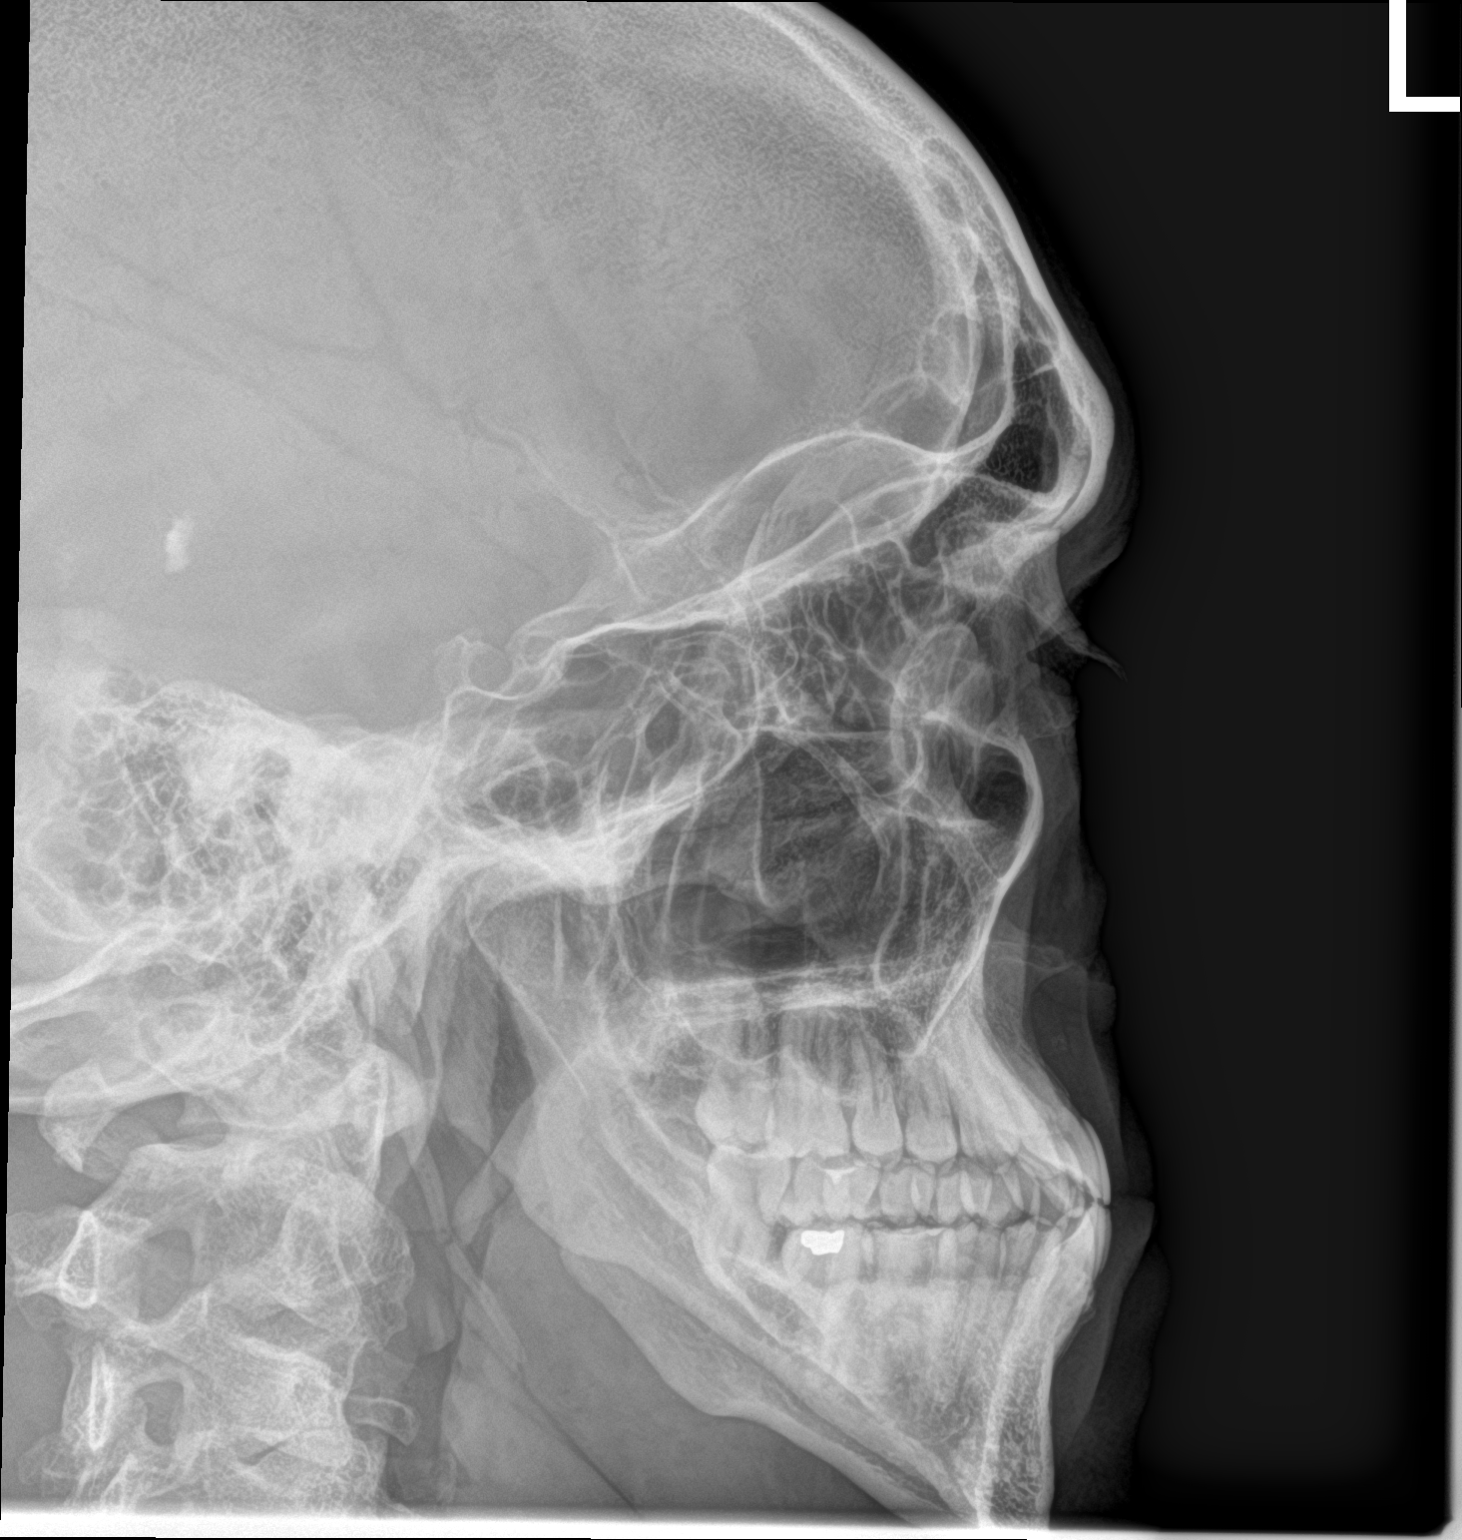

[3 of 3 positions shown; findings below may reference images not displayed]

FINDINGS: Paranasal sinuses are clear.

Calcification on lateral view may represent prominent pineal
calcification but incompletely assessed on current exam.

Mastoid air cells appear clear.
IMPRESSION: Paranasal sinuses are clear.

Calcification on lateral view may represent prominent pineal
calcification but incompletely assessed on current exam.

## 2019-05-31 DIAGNOSIS — I776 Arteritis, unspecified: Secondary | ICD-10-CM | POA: Diagnosis not present

## 2019-05-31 DIAGNOSIS — M545 Low back pain: Secondary | ICD-10-CM | POA: Diagnosis not present

## 2019-05-31 DIAGNOSIS — G8929 Other chronic pain: Secondary | ICD-10-CM | POA: Diagnosis not present

## 2019-05-31 DIAGNOSIS — F341 Dysthymic disorder: Secondary | ICD-10-CM | POA: Diagnosis not present

## 2019-05-31 DIAGNOSIS — G43709 Chronic migraine without aura, not intractable, without status migrainosus: Secondary | ICD-10-CM | POA: Diagnosis not present

## 2019-10-09 ENCOUNTER — Emergency Department (HOSPITAL_COMMUNITY): Payer: 59

## 2019-10-09 ENCOUNTER — Encounter (HOSPITAL_COMMUNITY): Payer: Self-pay

## 2019-10-09 ENCOUNTER — Other Ambulatory Visit: Payer: Self-pay

## 2019-10-09 ENCOUNTER — Inpatient Hospital Stay (HOSPITAL_COMMUNITY)
Admission: EM | Admit: 2019-10-09 | Discharge: 2019-10-12 | DRG: 392 | Disposition: A | Discharge status: Home / Self Care | Payer: 59 | Attending: Internal Medicine | Admitting: Internal Medicine

## 2019-10-09 DIAGNOSIS — K5792 Diverticulitis of intestine, part unspecified, without perforation or abscess without bleeding: Secondary | ICD-10-CM | POA: Diagnosis not present

## 2019-10-09 DIAGNOSIS — G43909 Migraine, unspecified, not intractable, without status migrainosus: Secondary | ICD-10-CM | POA: Diagnosis present

## 2019-10-09 DIAGNOSIS — N400 Enlarged prostate without lower urinary tract symptoms: Secondary | ICD-10-CM | POA: Diagnosis present

## 2019-10-09 DIAGNOSIS — N401 Enlarged prostate with lower urinary tract symptoms: Secondary | ICD-10-CM | POA: Diagnosis present

## 2019-10-09 DIAGNOSIS — Z20822 Contact with and (suspected) exposure to covid-19: Secondary | ICD-10-CM | POA: Diagnosis present

## 2019-10-09 DIAGNOSIS — L409 Psoriasis, unspecified: Secondary | ICD-10-CM | POA: Diagnosis present

## 2019-10-09 DIAGNOSIS — D6851 Activated protein C resistance: Secondary | ICD-10-CM | POA: Diagnosis present

## 2019-10-09 DIAGNOSIS — I10 Essential (primary) hypertension: Secondary | ICD-10-CM

## 2019-10-09 DIAGNOSIS — I82442 Acute embolism and thrombosis of left tibial vein: Secondary | ICD-10-CM

## 2019-10-09 DIAGNOSIS — Z888 Allergy status to other drugs, medicaments and biological substances status: Secondary | ICD-10-CM

## 2019-10-09 DIAGNOSIS — N138 Other obstructive and reflux uropathy: Secondary | ICD-10-CM | POA: Diagnosis present

## 2019-10-09 DIAGNOSIS — G43809 Other migraine, not intractable, without status migrainosus: Secondary | ICD-10-CM | POA: Diagnosis not present

## 2019-10-09 DIAGNOSIS — Z833 Family history of diabetes mellitus: Secondary | ICD-10-CM | POA: Diagnosis not present

## 2019-10-09 DIAGNOSIS — G4733 Obstructive sleep apnea (adult) (pediatric): Secondary | ICD-10-CM | POA: Diagnosis present

## 2019-10-09 DIAGNOSIS — Z79899 Other long term (current) drug therapy: Secondary | ICD-10-CM

## 2019-10-09 DIAGNOSIS — Z83438 Family history of other disorder of lipoprotein metabolism and other lipidemia: Secondary | ICD-10-CM | POA: Diagnosis not present

## 2019-10-09 DIAGNOSIS — K5720 Diverticulitis of large intestine with perforation and abscess without bleeding: Secondary | ICD-10-CM | POA: Diagnosis present

## 2019-10-09 DIAGNOSIS — Z801 Family history of malignant neoplasm of trachea, bronchus and lung: Secondary | ICD-10-CM

## 2019-10-09 DIAGNOSIS — Z7901 Long term (current) use of anticoagulants: Secondary | ICD-10-CM

## 2019-10-09 DIAGNOSIS — E785 Hyperlipidemia, unspecified: Secondary | ICD-10-CM | POA: Diagnosis present

## 2019-10-09 DIAGNOSIS — G43709 Chronic migraine without aura, not intractable, without status migrainosus: Secondary | ICD-10-CM | POA: Diagnosis not present

## 2019-10-09 DIAGNOSIS — Z86718 Personal history of other venous thrombosis and embolism: Secondary | ICD-10-CM

## 2019-10-09 DIAGNOSIS — K219 Gastro-esophageal reflux disease without esophagitis: Secondary | ICD-10-CM | POA: Diagnosis present

## 2019-10-09 DIAGNOSIS — R1032 Left lower quadrant pain: Secondary | ICD-10-CM | POA: Diagnosis not present

## 2019-10-09 DIAGNOSIS — R760 Raised antibody titer: Secondary | ICD-10-CM

## 2019-10-09 DIAGNOSIS — Z8249 Family history of ischemic heart disease and other diseases of the circulatory system: Secondary | ICD-10-CM

## 2019-10-09 LAB — CBC WITH DIFFERENTIAL/PLATELET
Abs Immature Granulocytes: 0.13 10*3/uL — ABNORMAL HIGH (ref 0.00–0.07)
Basophils Absolute: 0.1 10*3/uL (ref 0.0–0.1)
Basophils Relative: 1 %
Eosinophils Absolute: 0 10*3/uL (ref 0.0–0.5)
Eosinophils Relative: 0 %
HCT: 47.8 % (ref 39.0–52.0)
Hemoglobin: 16.1 g/dL (ref 13.0–17.0)
Immature Granulocytes: 1 %
Lymphocytes Relative: 8 %
Lymphs Abs: 1.7 10*3/uL (ref 0.7–4.0)
MCH: 31.2 pg (ref 26.0–34.0)
MCHC: 33.7 g/dL (ref 30.0–36.0)
MCV: 92.6 fL (ref 80.0–100.0)
Monocytes Absolute: 1.7 10*3/uL — ABNORMAL HIGH (ref 0.1–1.0)
Monocytes Relative: 9 %
Neutro Abs: 16.8 10*3/uL — ABNORMAL HIGH (ref 1.7–7.7)
Neutrophils Relative %: 81 %
Platelets: 355 10*3/uL (ref 150–400)
RBC: 5.16 MIL/uL (ref 4.22–5.81)
RDW: 12.2 % (ref 11.5–15.5)
WBC: 20.4 10*3/uL — ABNORMAL HIGH (ref 4.0–10.5)
nRBC: 0 % (ref 0.0–0.2)

## 2019-10-09 LAB — COMPREHENSIVE METABOLIC PANEL
ALT: 17 U/L (ref 0–44)
AST: 19 U/L (ref 15–41)
Albumin: 4.8 g/dL (ref 3.5–5.0)
Alkaline Phosphatase: 67 U/L (ref 38–126)
Anion gap: 11 (ref 5–15)
BUN: 14 mg/dL (ref 6–20)
CO2: 24 mmol/L (ref 22–32)
Calcium: 9.4 mg/dL (ref 8.9–10.3)
Chloride: 106 mmol/L (ref 98–111)
Creatinine, Ser: 1.31 mg/dL — ABNORMAL HIGH (ref 0.61–1.24)
GFR calc Af Amer: >60 mL/min (ref >60)
GFR calc non Af Amer: >60 mL/min (ref >60)
Glucose, Bld: 102 mg/dL — ABNORMAL HIGH (ref 70–99)
Potassium: 4 mmol/L (ref 3.5–5.1)
Sodium: 141 mmol/L (ref 135–145)
Total Bilirubin: 1.9 mg/dL — ABNORMAL HIGH (ref 0.3–1.2)
Total Protein: 8.4 g/dL — ABNORMAL HIGH (ref 6.5–8.1)

## 2019-10-09 LAB — SARS CORONAVIRUS 2 (TAT 6-24 HRS): SARS Coronavirus 2: NEGATIVE

## 2019-10-09 LAB — LIPASE, BLOOD: Lipase: 17 U/L (ref 11–51)

## 2019-10-09 MED ORDER — TIZANIDINE HCL 4 MG PO TABS
4.0000 mg | ORAL_TABLET | Freq: Four times a day (QID) | ORAL | Status: DC | PRN
  Administered 2019-10-09: 4 mg via ORAL
  Filled 2019-10-09: qty 1

## 2019-10-09 MED ORDER — LOSARTAN POTASSIUM 50 MG PO TABS
25.0000 mg | ORAL_TABLET | Freq: Every day | ORAL | Status: DC
  Administered 2019-10-09 – 2019-10-11 (×3): 25 mg via ORAL
  Filled 2019-10-09 (×3): qty 1

## 2019-10-09 MED ORDER — SODIUM CHLORIDE 0.9 % IV SOLN
INTRAVENOUS | Status: DC
  Administered 2019-10-09: 1000 mL via INTRAVENOUS

## 2019-10-09 MED ORDER — HYDROMORPHONE HCL 1 MG/ML IJ SOLN
1.0000 mg | Freq: Once | INTRAMUSCULAR | Status: AC
  Administered 2019-10-09: 1 mg via INTRAVENOUS
  Filled 2019-10-09: qty 1

## 2019-10-09 MED ORDER — ACETAMINOPHEN 325 MG PO TABS
650.0000 mg | ORAL_TABLET | Freq: Once | ORAL | Status: AC
  Administered 2019-10-09: 650 mg via ORAL
  Filled 2019-10-09: qty 2

## 2019-10-09 MED ORDER — ZOLPIDEM TARTRATE 5 MG PO TABS: 5.0000 mg | ORAL_TABLET | Freq: Every evening | ORAL | Status: DC | PRN

## 2019-10-09 MED ORDER — HYDROMORPHONE HCL 1 MG/ML IJ SOLN
1.0000 mg | INTRAMUSCULAR | Status: DC | PRN
  Administered 2019-10-09 – 2019-10-10 (×4): 1 mg via INTRAVENOUS
  Filled 2019-10-09 (×4): qty 1

## 2019-10-09 MED ORDER — ROSUVASTATIN CALCIUM 20 MG PO TABS: 40.0000 mg | ORAL_TABLET | ORAL | Status: DC

## 2019-10-09 MED ORDER — PIPERACILLIN-TAZOBACTAM 3.375 G IVPB 30 MIN
3.3750 g | Freq: Once | INTRAVENOUS | Status: AC
  Administered 2019-10-09: 3.375 g via INTRAVENOUS
  Filled 2019-10-09: qty 50

## 2019-10-09 MED ORDER — FLUTICASONE PROPIONATE 50 MCG/ACT NA SUSP: 2.0000 | Freq: Every day | NASAL | Status: DC | PRN

## 2019-10-09 MED ORDER — SODIUM CHLORIDE 0.9 % IV BOLUS
1000.0000 mL | Freq: Once | INTRAVENOUS | Status: AC
  Administered 2019-10-09: 1000 mL via INTRAVENOUS

## 2019-10-09 MED ORDER — SODIUM CHLORIDE (PF) 0.9 % IJ SOLN
INTRAMUSCULAR | Status: AC
  Filled 2019-10-09: qty 50

## 2019-10-09 MED ORDER — RIVAROXABAN 20 MG PO TABS
20.0000 mg | ORAL_TABLET | Freq: Every day | ORAL | Status: DC
  Filled 2019-10-09: qty 1

## 2019-10-09 MED ORDER — ONDANSETRON HCL 4 MG PO TABS: 4.0000 mg | ORAL_TABLET | Freq: Four times a day (QID) | ORAL | Status: DC | PRN

## 2019-10-09 MED ORDER — PIPERACILLIN-TAZOBACTAM 3.375 G IVPB
3.3750 g | Freq: Three times a day (TID) | INTRAVENOUS | Status: AC
  Administered 2019-10-09 – 2019-10-11 (×6): 3.375 g via INTRAVENOUS
  Filled 2019-10-09 (×6): qty 50

## 2019-10-09 MED ORDER — LORATADINE 10 MG PO TABS
10.0000 mg | ORAL_TABLET | Freq: Every day | ORAL | Status: DC
  Administered 2019-10-09 – 2019-10-12 (×4): 10 mg via ORAL
  Filled 2019-10-09 (×4): qty 1

## 2019-10-09 MED ORDER — PIPERACILLIN-TAZOBACTAM 3.375 G IVPB 30 MIN: 3.3750 g | Freq: Once | INTRAVENOUS | Status: DC

## 2019-10-09 MED ORDER — RIVAROXABAN 10 MG PO TABS
10.0000 mg | ORAL_TABLET | Freq: Every day | ORAL | Status: DC
  Administered 2019-10-09: 10 mg via ORAL
  Filled 2019-10-09: qty 1

## 2019-10-09 MED ORDER — BUPROPION HCL ER (XL) 300 MG PO TB24
300.0000 mg | ORAL_TABLET | Freq: Every day | ORAL | Status: DC
  Administered 2019-10-09 – 2019-10-12 (×4): 300 mg via ORAL
  Filled 2019-10-09 (×4): qty 1

## 2019-10-09 MED ORDER — TOPIRAMATE 25 MG PO TABS
12.5000 mg | ORAL_TABLET | Freq: Two times a day (BID) | ORAL | Status: DC
  Administered 2019-10-09 – 2019-10-12 (×6): 12.5 mg via ORAL
  Filled 2019-10-09 (×6): qty 0.5

## 2019-10-09 MED ORDER — TAMSULOSIN HCL 0.4 MG PO CAPS
0.4000 mg | ORAL_CAPSULE | Freq: Every day | ORAL | Status: DC
  Administered 2019-10-09 – 2019-10-12 (×4): 0.4 mg via ORAL
  Filled 2019-10-09 (×4): qty 1

## 2019-10-09 MED ORDER — IOHEXOL 300 MG/ML  SOLN
100.0000 mL | Freq: Once | INTRAMUSCULAR | Status: AC | PRN
  Administered 2019-10-09: 100 mL via INTRAVENOUS

## 2019-10-09 MED ORDER — TOPIRAMATE ER 25 MG PO CAP24: 25.0000 mg | ORAL_CAPSULE | Freq: Every day | ORAL | Status: DC

## 2019-10-09 MED ORDER — ONDANSETRON HCL 4 MG/2ML IJ SOLN
4.0000 mg | Freq: Once | INTRAMUSCULAR | Status: AC
  Administered 2019-10-09: 4 mg via INTRAVENOUS
  Filled 2019-10-09: qty 2

## 2019-10-09 MED ORDER — ONDANSETRON HCL 4 MG/2ML IJ SOLN
4.0000 mg | Freq: Four times a day (QID) | INTRAMUSCULAR | Status: DC | PRN
  Administered 2019-10-09: 4 mg via INTRAVENOUS
  Filled 2019-10-09: qty 2

## 2019-10-09 NOTE — Progress Notes (Signed)
Pharmacy Antibiotic Note  Benjamin [name] is a 49 y.o. male admitted on 10/09/2019 with IAI.  Pharmacy has been consulted for zosyn dosing. WBC 20.4, SCr 1.31, AF 5/10 CT: acute diverticulitis of mid descending colon Plan: Zosyn 3.375g IV q8h (4 hour infusion).  Height: 5\' 9"  (175.3 cm) Weight: 86.2 kg (190 lb) IBW/kg (Calculated) : 70.7  Temp (24hrs), Avg:98.4 F (36.9 C), Min:98.1 F (36.7 C), Max:98.7 F (37.1 C)  Recent Labs  Lab 10/09/19 1212  WBC 20.4*  CREATININE 1.31*    Estimated Creatinine Clearance: 73.4 mL/min (A) (by C-G formula based on SCr of 1.31 mg/dL (H)).    Allergies  Allergen Reactions  . Bismuth Subsalicylate Swelling  . Compazine [Prochlorperazine Edisylate] Other (See Comments)    Cervical dystonia and panic attack    Antimicrobials this admission: 5/10 Zosyn >> Dose adjustments this admission:  Microbiology results:  Thank you for allowing pharmacy to be a part of this patient's care.  Eudelia Bunch, Pharm.D 10/09/2019 2:02 PM

## 2019-10-09 NOTE — H&P (Signed)
[facility] History and Physical  [name] [id number] DOB: 04-18-1971 DOA: 10/09/2019  Referring physician: ED  PCP: Patient, No Pcp Per   Patient is coming from: Home  Chief Complaint: Abdominal pain  HPI: Benjamin [name] is a 49 y.o. male has medical history of DVT, migraine, GERD, hyperlipidemia, history of diverticulitis in the past, GERD, presented to hospital with complaints of left lower quadrant pain for the last 2 days.  Pain was stated with chills and difficulty sleeping.  The pain was moderate to severe in intensity exacerbated by coughing and movement of the abdominal wall.  Patient stated that he did have a history of diverticulitis in the past, last one was uncomplicated and got better with the oral antibiotic.  He does have history of colonoscopy 5 years back when polyps were noted.  Patient is currently on Xarelto for anticoagulation with history of DVT.  He states that he has factor V Leiden mutation.  He has been seen by oncology as outpatient.  Patient denies any nausea, vomiting but had a low-grade fever yesterday.  Patient denies any urinary urgency, frequency or dysuria.  Denies any shortness of breath, cough, chest pain, palpitation, sputum production.  Denies any dizziness, lightheadedness, syncope.  ED Course: In the ED, patient received IV fluids, Dilaudid, Zosyn and was considered for admission to the hospital.  CT scan of the abdomen and pelvis showed evidence of descending Colon acute diverticulitis with phlegmon.  Hospitalist team was then consulted for admission to the hospital.  Review of Systems:  All systems were reviewed and were negative unless otherwise mentioned in the HPI  Past Medical History:  Diagnosis Date  . Acute DVT of left tibial vein ([place]) 04/24/2017   Xarelto started (dx'd in emergency dept).  Dr. Marin Olp recommended full dose xarelto x 22mo, with an additional year of maintenance dose xarelto (as of 05/2017 initial consult  note).  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  Pt Factor V leiden heterozygote.  Stable as of 09/2017 hem f/u.  . Adenomatous colon polyp 10/04/15  . ALLERGIC RHINITIS 07/11/2008  . Allergy    SEASONAL  . 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 Anxiety   . BACK PAIN, CHRONIC, INTERMITTENT 10/17/2008  . Barrett's esophagus determined by biopsy 04/12/14  . BENIGN PROSTATIC HYPERTROPHY, WITH URINARY OBSTRUCTION 07/21/2007  . BURSITIS, HIP 09/30/2009  . CAP (community acquired pneumonia) 06/2014  . Cataracts, bilateral 2016   Not visually significant  . Chronic headaches    Migraine syndrome: worsening fall 2018, neuro eval 04/2017--topamax and maxalt started, MRI brain ordered (LP to be done if MRI neg--pt reporting fevers).  Pt referred for sleep study.  . Chronic renal insufficiency, stage II (mild)    GFR 60s-70s (suspect HTN and NSAID damage as etiology).  GFR dipped into 50s fall 2018 after lots of NSAID use for chronic daily HA's.  . Diverticulitis 2016   CT 01/2015  . Frequent headaches   . Gastric polyp 2006  . GERD without esophagitis   . Herpes labialis   . Hiatal hernia   . Hyperlipidemia   . HYPERTENSION 01/05/2007  . OSA (obstructive sleep apnea) 04/2017   Dr. Rexene Alberts eval 04/2017-plan for sleep study.  Marland Kitchen PSORIASIS 01/05/2007   Past Surgical History:  Procedure Laterality Date  . COLONOSCOPY W/ POLYPECTOMY  10/04/15   51mm tubular adenoma + diverticulosis: recall 5 yrs (Dr. Fuller Plan)  . ESOPHAGOGASTRODUODENOSCOPY  04/12/14   Barrett's  esophagus  . LIPOMA EXCISION     several, arm    Social History:  reports that he has never smoked. He has never used smokeless tobacco. He reports current alcohol use of about 2.0 standard drinks of alcohol per week. He reports that he does not use drugs.  Allergies  Allergen Reactions  . Bismuth Subsalicylate Swelling  . Compazine [Prochlorperazine Edisylate] Other (See Comments)    Cervical dystonia and panic  attack    Family History  Problem Relation Age of Onset  . CAD Father        around age 62  . Heart disease Father   . Hyperlipidemia Father   . Hypertension Father   . CAD Paternal Grandmother   . Diabetes Paternal Grandmother   . Hypertension Mother   . Hypertension Brother   . Hyperlipidemia Brother   . Lung cancer Maternal Grandmother   . Cancer Maternal Grandfather        type unknown  . Colon cancer Other        neg hx  . Prostate cancer Other        neg hx     Prior to Admission medications   Medication Sig Start Date End Date Taking? Authorizing Provider  acetaminophen (TYLENOL) 325 MG tablet Take 650 mg by mouth every 6 (six) hours as needed for mild pain or headache.   Yes [provider]  buPROPion (WELLBUTRIN XL) 300 MG 24 hr tablet Take 1 tablet (300 mg total) by mouth daily. 01/25/18  Yes [name], PA-C  cetirizine-pseudoephedrine (ZYRTEC-D) 5-120 MG tablet Take 1 tablet by mouth daily.   Yes [provider]  fluticasone (FLONASE) 50 MCG/ACT nasal spray Place 2 sprays into both nostrils daily. Patient taking differently: Place 2 sprays into both nostrils daily as needed for allergies.  09/11/14  Yes McGowen, Adrian Blackwater, MD  losartan (COZAAR) 25 MG tablet Take 25 mg by mouth daily. 08/28/19  Yes [provider]  rivaroxaban (XARELTO) 10 MG TABS tablet TAKE 1 TABLET (20 MG TOTAL) BY MOUTH DAILY WITH SUPPER. Patient taking differently: Take 20 mg by mouth daily. TAKE 1 TABLET (20 MG TOTAL) BY MOUTH DAILY WITH SUPPER. 04/11/18  Yes [place], Holli Humbles, NP  rosuvastatin (CRESTOR) 40 MG tablet Take 1 tablet (40 mg total) by mouth once a week. 12/27/17  Yes [name], PA-C  tamsulosin (FLOMAX) 0.4 MG CAPS capsule Take 1 capsule (0.4 mg total) by mouth daily. 04/05/18  Yes Timmothy Euler, [place] D, PA-C  tiZANidine (ZANAFLEX) 4 MG tablet Take 1 tablet (4 mg total) every 6 (six) hours as needed by mouth for muscle spasms. Or for headaches.  04/09/17  Yes Melvenia Beam, MD  TROKENDI XR 25 MG CP24 TAKE 2 CAPSULES BY MOUTH EVERY DAY Patient taking differently: Take 25 mg by mouth daily.  10/21/18  Yes Forrest Moron, MD  valACYclovir (VALTREX) 500 MG tablet Take 1 tablet (500 mg total) by mouth daily. Patient taking differently: Take 500 mg by mouth daily as needed (fever blisters).  12/30/17  Yes [name], PA-C  hyoscyamine (LEVSIN SL) 0.125 MG SL tablet Place 1 tablet (0.125 mg total) under the tongue every 4 (four) hours as needed for cramping. 10/04/15   Ladene Artist, MD  rizatriptan (MAXALT-MLT) 10 MG disintegrating tablet Take 1 tablet (10 mg total) as needed by mouth for migraine. May repeat in 2 hours if needed 04/14/17   Dohmeier, Asencion Partridge, MD  lisinopril-hydrochlorothiazide (PRINZIDE,ZESTORETIC) 20-25 MG  per tablet Take 1 tablet by mouth daily. 07/24/11 08/21/11  Marletta Lor, MD    Physical Exam: Vitals:   10/09/19 1230 10/09/19 1245 10/09/19 1300 10/09/19 1330  BP: 133/86 134/90 125/84 (!) 154/92  Pulse: (!) 103 (!) 102 (!) 111 (!) 116  Resp: 16 18 18 18   Temp:      TempSrc:      SpO2: 99% 100% 96% 93%  Weight:      Height:       Wt Readings from Last 3 Encounters:  10/09/19 86.2 kg  04/11/18 85.3 kg  04/05/18 85.8 kg   Body mass index is 28.06 kg/m.  General:  Average built, not in obvious distress HENT: Normocephalic, pupils equally reacting to light and accommodation.  No scleral pallor or icterus noted. Oral mucosa is moist.  Chest:  Clear breath sounds.  Diminished breath sounds bilaterally. No crackles or wheezes.  CVS: S1 &S2 heard. No murmur.  Regular rate and rhythm. Abdomen: Left lower quadrant tenderness on palpation.  Voluntary guarding. Extremities: No cyanosis, clubbing or edema.  Peripheral pulses are palpable. Psych: Alert, awake and oriented, normal mood CNS:  No cranial nerve deficits.  Power equal in all extremities.    Skin: Warm and dry.  No rashes noted.  Labs on  Admission:   CBC: Recent Labs  Lab 10/09/19 1212  WBC 20.4*  NEUTROABS 16.8*  HGB 16.1  HCT 47.8  MCV 92.6  PLT [id number]    Basic Metabolic Panel: Recent Labs  Lab 10/09/19 1212  NA 141  K 4.0  CL 106  CO2 24  GLUCOSE 102*  BUN 14  CREATININE 1.31*  CALCIUM 9.4    Liver Function Tests: Recent Labs  Lab 10/09/19 1212  AST 19  ALT 17  ALKPHOS 67  BILITOT 1.9*  PROT 8.4*  ALBUMIN 4.8   Recent Labs  Lab 10/09/19 1212  LIPASE 17   No results for input(s): AMMONIA in the last 168 hours.  Cardiac Enzymes: No results for input(s): CKTOTAL, CKMB, CKMBINDEX, TROPONINI in the last 168 hours.  BNP (last 3 results) No results for input(s): BNP in the last 8760 hours.  ProBNP (last 3 results) No results for input(s): PROBNP in the last 8760 hours.  CBG: No results for input(s): GLUCAP in the last 168 hours.  Lipase     Component Value Date/Time   LIPASE 17 10/09/2019 1212     Drugs of Abuse  No results found for: LABOPIA, COCAINSCRNUR, LABBENZ, AMPHETMU, THCU, LABBARB    Radiological Exams on Admission: CT ABDOMEN PELVIS W CONTRAST  Result Date: 10/09/2019 CLINICAL DATA:  Left lower quadrant abdominal pain. History of diverticulitis EXAM: CT ABDOMEN AND PELVIS WITH CONTRAST TECHNIQUE: Multidetector CT imaging of the abdomen and pelvis was performed using the standard protocol following bolus administration of intravenous contrast. CONTRAST:  152mL OMNIPAQUE IOHEXOL 300 MG/ML  SOLN COMPARISON:  02/08/2018 FINDINGS: Lower chest: No acute abnormality. Hepatobiliary: No focal liver abnormality is seen. No gallstones, gallbladder wall thickening, or biliary dilatation. Pancreas: Unremarkable. No pancreatic ductal dilatation or surrounding inflammatory changes. Spleen: Normal in size without focal abnormality. Adrenals/Urinary Tract: Adrenal glands are unremarkable. Kidneys are normal, without renal calculi, focal lesion, or hydronephrosis. Bladder is unremarkable.  Stomach/Bowel: Focally inflamed segment of mid descending colon where there is a prominent diverticula with prominent pericolonic fat stranding (series 2, image 39). There is a small amount of fluid along the anterior margin of the mid descending colon measuring approximately 2.8 x 2.8  x 1.0 cm without a well-defined enhancing rim (series 5, image 134). Numerous additional diverticula throughout the colon. Normal appendix within the right lower quadrant. No dilated loops of bowel to suggest obstruction. Vascular/Lymphatic: No significant vascular findings are present. No enlarged abdominal or pelvic lymph nodes. Reproductive: Prostate is unremarkable. Other: No extraluminal air/pneumoperitoneum. No well-defined abscess. No abdominal wall hernia. Musculoskeletal: No acute or significant osseous findings. IMPRESSION: Acute diverticulitis of the mid descending colon. There is a small ill-defined fluid collection along the anterior margin of the mid descending colon measuring up to 2.8 cm without a well-defined enhancing rim. Findings are favored to represent phlegmon. No extraluminal air/pneumoperitoneum. Electronically Signed   By: Davina Poke D.O.   On: 10/09/2019 13:33    EKG: Not available for review.  Assessment/Plan Principal Problem:   Diverticulitis Active Problems:   Essential hypertension   Benign prostatic hyperplasia with urinary obstruction   Acute descending colon diverticulitis with phlegmon formation. Previous history of recurrent diverticulitis.  CT scan shows phlegmon.  We will put the patient on clears, IV antibiotic with Zosyn IV, IV analgesia.  Continue IV fluid hydration.  WBC of 20.4  History of hypertension.  Blood pressure was elevated in the ED.  Likely secondary to pain.  Continue losartan.  Closely monitor blood pressure  History of migraine.  On rizatriptan and Trokendi as outpatient.   History of BPH.  Continue tamsulosin.  DVT on Xarelto.  We will continue with  that.  Patient was advised to discuss this with his oncologist.  Looks like he has factor V Leiden mutation.  DVT Prophylaxis: Xarelto  Consultant: None  Code Status: Full code  Microbiology none  Antibiotics: Zosyn IV  Family Communication:  Patients' condition and plan of care including tests being ordered have been discussed with the patient  who indicate understanding and agree with the plan.  Status is: Inpatient  Remains inpatient appropriate because:IV treatments appropriate due to intensity of illness or inability to take PO and Diverticulitis needing IV antibiotic   Dispo: The patient is from: Home              Anticipated d/c is to: Home              Anticipated d/c date is: 2 to 3 days              Patient currently is not medically stable to d/c.   Severity of Illness: The appropriate patient status for this patient is INPATIENT. Inpatient status is judged to be reasonable and necessary in order to provide the required intensity of service to ensure the patient's safety. The patient's presenting symptoms, physical exam findings, and initial radiographic and laboratory data in the context of their chronic comorbidities is felt to place them at high risk for further clinical deterioration. Furthermore, it is not anticipated that the patient will be medically stable for discharge from the hospital within 2 midnights of admission. I certify that at the point of admission it is my clinical judgment that the patient will require inpatient hospital care spanning beyond 2 midnights from the point of admission due to high intensity of service, high risk for further deterioration and high frequency of surveillance required.   Signed, Flora Lipps, MD [facility] 10/09/2019

## 2019-10-09 NOTE — ED Provider Notes (Signed)
[facility] DEPT Provider Note   CSN: [id number] Arrival date & time: 10/09/19  [id number]     History Chief Complaint  Patient presents with  . Abdominal Pain    Benjamin [name] is a 49 y.o. male with a past medical history of DVT, diverticulitis, hypertension, presenting to the ED with a chief complaint of left lower quadrant pain.  2 days ago started having left mid and lower quadrant pain with associated chills.  He had trouble falling asleep last night secondary to pain.  Pain is worse with palpation.  He reports normal bowel movements, denies any nausea, vomiting, urinary symptoms.  States that his girlfriend had similar fever/chills but denies abdominal pain. Patient reports temperature of 100.1 F yesterday. Some improvement noted with 1 dose of hydrocodone that was leftover from a prior injury.  Denies suspicious food intake.  Has a history of diverticulitis uncomplicated in one case with an abscess.  He denies prior abdominal surgeries, chest pain, shortness of breath, back pain.  HPI     Past Medical History:  Diagnosis Date  . Acute DVT of left tibial vein ([place]) 04/24/2017   Xarelto started (dx'd in emergency dept).  Dr. Marin Olp recommended full dose xarelto x 67mo, with an additional year of maintenance dose xarelto (as of 05/2017 initial consult note).  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  Pt Factor V leiden heterozygote.  Stable as of 09/2017 hem f/u.  . Adenomatous colon polyp 10/04/15  . ALLERGIC RHINITIS 07/11/2008  . Allergy    SEASONAL  . 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 Anxiety   . BACK PAIN, CHRONIC, INTERMITTENT 10/17/2008  . Barrett's esophagus determined by biopsy 04/12/14  . BENIGN PROSTATIC HYPERTROPHY, WITH URINARY OBSTRUCTION 07/21/2007  . BURSITIS, HIP 09/30/2009  . CAP (community acquired pneumonia) 06/2014  . Cataracts, bilateral 2016   Not visually significant  . Chronic headaches    Migraine syndrome: worsening fall 2018, neuro eval 04/2017--topamax and maxalt started, MRI brain ordered (LP to be done if MRI neg--pt reporting fevers).  Pt referred for sleep study.  . Chronic renal insufficiency, stage II (mild)    GFR 60s-70s (suspect HTN and NSAID damage as etiology).  GFR dipped into 50s fall 2018 after lots of NSAID use for chronic daily HA's.  . Diverticulitis 2016   CT 01/2015  . Frequent headaches   . Gastric polyp 2006  . GERD without esophagitis   . Herpes labialis   . Hiatal hernia   . Hyperlipidemia   . HYPERTENSION 01/05/2007  . OSA (obstructive sleep apnea) 04/2017   Dr. Rexene Alberts eval 04/2017-plan for sleep study.  Marland Kitchen PSORIASIS 01/05/2007    Patient Active Problem List   Diagnosis Date Noted  . Diverticulitis 10/09/2019  . Acute DVT of left tibial vein ([place]) 04/27/2017  . Chronic migraine without aura, intractable, with status migrainosus 04/09/2017  . New daily persistent headache 03/29/2017  . Maxillary sinusitis 03/29/2017  . Urinary frequency 09/02/2015  . Rash and nonspecific skin eruption 09/02/2015  . LLQ abdominal pain 08/09/2015  . History of diverticulitis 08/09/2015  . LLQ pain 08/06/2015  . Nausea without vomiting 08/06/2015  . Diverticulitis of colon 07/02/2015  . BMI 26.0-26.9,adult 09/17/2014  . Health maintenance examination 06/28/2014  . Adjustment disorder with mixed anxiety and depressed mood 02/19/2014  . Chronic cluster headache, not intractable 02/19/2014  . Herpes labialis 05/17/2012  . BURSITIS, HIP 09/30/2009  . BACK PAIN,  CHRONIC, INTERMITTENT 10/17/2008  . ALLERGIC RHINITIS 07/11/2008  . Benign prostatic hyperplasia with urinary obstruction 07/21/2007  . Essential hypertension 01/05/2007  . PSORIASIS 01/05/2007    Past Surgical History:  Procedure Laterality Date  . COLONOSCOPY W/ POLYPECTOMY  10/04/15   64mm tubular adenoma + diverticulosis: recall 5 yrs (Dr. Fuller Plan)  . ESOPHAGOGASTRODUODENOSCOPY  04/12/14   Barrett's  esophagus  . LIPOMA EXCISION     several, arm       Family History  Problem Relation Age of Onset  . CAD Father        around age 14  . Heart disease Father   . Hyperlipidemia Father   . Hypertension Father   . CAD Paternal Grandmother   . Diabetes Paternal Grandmother   . Hypertension Mother   . Hypertension Brother   . Hyperlipidemia Brother   . Lung cancer Maternal Grandmother   . Cancer Maternal Grandfather        type unknown  . Colon cancer Other        neg hx  . Prostate cancer Other        neg hx    Social History   Tobacco Use  . Smoking status: Never Smoker  . Smokeless tobacco: Never Used  Substance Use Topics  . Alcohol use: Yes    Alcohol/week: 2.0 standard drinks    Types: 2 Cans of beer per week    Comment: occasionally  . Drug use: No    Home Medications Prior to Admission medications   Medication Sig Start Date End Date Taking? Authorizing Provider  acetaminophen (TYLENOL) 325 MG tablet Take 650 mg by mouth every 6 (six) hours as needed for mild pain or headache.   Yes [provider]  buPROPion (WELLBUTRIN XL) 300 MG 24 hr tablet Take 1 tablet (300 mg total) by mouth daily. 01/25/18  Yes [name], PA-C  cetirizine-pseudoephedrine (ZYRTEC-D) 5-120 MG tablet Take 1 tablet by mouth daily.   Yes [provider]  fluticasone (FLONASE) 50 MCG/ACT nasal spray Place 2 sprays into both nostrils daily. Patient taking differently: Place 2 sprays into both nostrils daily as needed for allergies.  09/11/14  Yes McGowen, Adrian Blackwater, MD  losartan (COZAAR) 25 MG tablet Take 25 mg by mouth daily. 08/28/19  Yes [provider]  rivaroxaban (XARELTO) 10 MG TABS tablet TAKE 1 TABLET (20 MG TOTAL) BY MOUTH DAILY WITH SUPPER. Patient taking differently: Take 20 mg by mouth daily. TAKE 1 TABLET (20 MG TOTAL) BY MOUTH DAILY WITH SUPPER. 04/11/18  Yes [place], Holli Humbles, NP  rosuvastatin (CRESTOR) 40 MG tablet Take 1 tablet (40 mg total) by  mouth once a week. 12/27/17  Yes [name], PA-C  tamsulosin (FLOMAX) 0.4 MG CAPS capsule Take 1 capsule (0.4 mg total) by mouth daily. 04/05/18  Yes Timmothy Euler, [place] D, PA-C  tiZANidine (ZANAFLEX) 4 MG tablet Take 1 tablet (4 mg total) every 6 (six) hours as needed by mouth for muscle spasms. Or for headaches. 04/09/17  Yes Melvenia Beam, MD  TROKENDI XR 25 MG CP24 TAKE 2 CAPSULES BY MOUTH EVERY DAY Patient taking differently: Take 25 mg by mouth daily.  10/21/18  Yes Forrest Moron, MD  valACYclovir (VALTREX) 500 MG tablet Take 1 tablet (500 mg total) by mouth daily. Patient taking differently: Take 500 mg by mouth daily as needed (fever blisters).  12/30/17  Yes [name], PA-C  acyclovir ointment (ZOVIRAX) 5 % Apply to affected areas every  3 hours as needed Patient not taking: Reported on 10/09/2019 06/18/17   Tammi Sou, MD  ALPRAZolam Duanne Moron) 0.25 MG tablet Take 1-2 tabs 30-60 minutes before MRI may repeat if needed. Do not drive. Patient not taking: Reported on 10/09/2019 04/12/17   Melvenia Beam, MD  hyoscyamine (LEVSIN SL) 0.125 MG SL tablet Place 1 tablet (0.125 mg total) under the tongue every 4 (four) hours as needed for cramping. 10/04/15   Ladene Artist, MD  ondansetron (ZOFRAN ODT) 4 MG disintegrating tablet 4mg  ODT q4 hours prn nausea/vomit Patient not taking: Reported on 10/09/2019 02/08/18   Milton Ferguson, MD  RABEprazole (ACIPHEX) 20 MG tablet Take 1 tablet (20 mg total) by mouth daily. Patient not taking: Reported on 10/09/2019 12/06/16   Tammi Sou, MD  rizatriptan (MAXALT-MLT) 10 MG disintegrating tablet Take 1 tablet (10 mg total) as needed by mouth for migraine. May repeat in 2 hours if needed 04/14/17   Dohmeier, Asencion Partridge, MD  lisinopril-hydrochlorothiazide (PRINZIDE,ZESTORETIC) 20-25 MG per tablet Take 1 tablet by mouth daily. 07/24/11 08/21/11  Marletta Lor, MD    Allergies    Bismuth subsalicylate and Compazine [prochlorperazine  edisylate]  Review of Systems   Review of Systems  Constitutional: Positive for chills and fever. Negative for appetite change.  HENT: Negative for ear pain, rhinorrhea, sneezing and sore throat.   Eyes: Negative for photophobia and visual disturbance.  Respiratory: Negative for cough, chest tightness, shortness of breath and wheezing.   Cardiovascular: Negative for chest pain and palpitations.  Gastrointestinal: Positive for abdominal pain. Negative for blood in stool, constipation, diarrhea, nausea and vomiting.  Genitourinary: Negative for dysuria, hematuria and urgency.  Musculoskeletal: Negative for myalgias.  Skin: Negative for rash.  Neurological: Negative for dizziness, weakness and light-headedness.    Physical Exam Updated Vital Signs BP (!) 154/92   Pulse (!) 116   Temp 98.7 F (37.1 C) (Oral)   Resp 18   Ht 5\' 9"  (1.753 m)   Wt 86.2 kg   SpO2 93%   BMI 28.06 kg/m   Physical Exam Vitals and nursing note reviewed.  Constitutional:      General: He is not in acute distress.    Appearance: He is well-developed.  HENT:     Head: Normocephalic and atraumatic.     Nose: Nose normal.  Eyes:     General: No scleral icterus.       Left eye: No discharge.     Conjunctiva/sclera: Conjunctivae normal.  Cardiovascular:     Rate and Rhythm: Regular rhythm. Tachycardia present.     Heart sounds: Normal heart sounds. No murmur. No friction rub. No gallop.   Pulmonary:     Effort: Pulmonary effort is normal. No respiratory distress.     Breath sounds: Normal breath sounds.  Abdominal:     General: Bowel sounds are normal. There is no distension.     Palpations: Abdomen is soft.     Tenderness: There is abdominal tenderness. There is no guarding.    Musculoskeletal:        General: Normal range of motion.     Cervical back: Normal range of motion and neck supple.  Skin:    General: Skin is warm and dry.     Findings: No rash.  Neurological:     Mental Status: He  is alert.     Motor: No abnormal muscle tone.     Coordination: Coordination normal.     ED Results / Procedures /  Treatments   Labs (all labs ordered are listed, but only abnormal results are displayed) Labs Reviewed  COMPREHENSIVE METABOLIC PANEL - Abnormal; Notable for the following components:      Result Value   Glucose, Bld 102 (*)    Creatinine, Ser 1.31 (*)    Total Protein 8.4 (*)    Total Bilirubin 1.9 (*)    All other components within normal limits  CBC WITH DIFFERENTIAL/PLATELET - Abnormal; Notable for the following components:   WBC 20.4 (*)    Neutro Abs 16.8 (*)    Monocytes Absolute 1.7 (*)    Abs Immature Granulocytes 0.13 (*)    All other components within normal limits  SARS CORONAVIRUS 2 (TAT 6-24 HRS)  LIPASE, BLOOD    EKG None  Radiology CT ABDOMEN PELVIS W CONTRAST  Result Date: 10/09/2019 CLINICAL DATA:  Left lower quadrant abdominal pain. History of diverticulitis EXAM: CT ABDOMEN AND PELVIS WITH CONTRAST TECHNIQUE: Multidetector CT imaging of the abdomen and pelvis was performed using the standard protocol following bolus administration of intravenous contrast. CONTRAST:  167mL OMNIPAQUE IOHEXOL 300 MG/ML  SOLN COMPARISON:  02/08/2018 FINDINGS: Lower chest: No acute abnormality. Hepatobiliary: No focal liver abnormality is seen. No gallstones, gallbladder wall thickening, or biliary dilatation. Pancreas: Unremarkable. No pancreatic ductal dilatation or surrounding inflammatory changes. Spleen: Normal in size without focal abnormality. Adrenals/Urinary Tract: Adrenal glands are unremarkable. Kidneys are normal, without renal calculi, focal lesion, or hydronephrosis. Bladder is unremarkable. Stomach/Bowel: Focally inflamed segment of mid descending colon where there is a prominent diverticula with prominent pericolonic fat stranding (series 2, image 39). There is a small amount of fluid along the anterior margin of the mid descending colon measuring  approximately 2.8 x 2.8 x 1.0 cm without a well-defined enhancing rim (series 5, image 134). Numerous additional diverticula throughout the colon. Normal appendix within the right lower quadrant. No dilated loops of bowel to suggest obstruction. Vascular/Lymphatic: No significant vascular findings are present. No enlarged abdominal or pelvic lymph nodes. Reproductive: Prostate is unremarkable. Other: No extraluminal air/pneumoperitoneum. No well-defined abscess. No abdominal wall hernia. Musculoskeletal: No acute or significant osseous findings. IMPRESSION: Acute diverticulitis of the mid descending colon. There is a small ill-defined fluid collection along the anterior margin of the mid descending colon measuring up to 2.8 cm without a well-defined enhancing rim. Findings are favored to represent phlegmon. No extraluminal air/pneumoperitoneum. Electronically Signed   By: Davina Poke D.O.   On: 10/09/2019 13:33    Procedures Procedures (including critical care time)  Medications Ordered in ED Medications  piperacillin-tazobactam (ZOSYN) IVPB 3.375 g (has no administration in time range)  piperacillin-tazobactam (ZOSYN) IVPB 3.375 g (has no administration in time range)  sodium chloride 0.9 % bolus 1,000 mL (0 mLs Intravenous Stopped 10/09/19 1335)  HYDROmorphone (DILAUDID) injection 1 mg (1 mg Intravenous Given 10/09/19 1209)  ondansetron (ZOFRAN) injection 4 mg (4 mg Intravenous Given 10/09/19 1210)  sodium chloride (PF) 0.9 % injection (  Given by Other 10/09/19 1335)  iohexol (OMNIPAQUE) 300 MG/ML solution 100 mL (100 mLs Intravenous Contrast Given 10/09/19 1316)    ED Course  I have reviewed the triage vital signs and the nursing notes.  Pertinent labs & imaging results that were available during my care of the patient were reviewed by me and considered in my medical decision making (see chart for details).    MDM Rules/Calculators/A&P  49 year old male with past  medical history of diverticulitis presenting to the ED with a chief complaint of left lower quadrant abdominal pain for the past 2 days.  Symptoms got worse this morning.  Reports associated chills, temperature 100.1 F yesterday. He has a history of diverticulitis with abscess formation in 2016.  From what he remembers he is concerned that this feels similar.  He denies any diarrhea, constipation, nausea, vomiting, urinary symptoms.  On exam there is tenderness palpation of the left mid and lower quadrant without rebound or guarding.  Patient appears uncomfortable, slightly tachycardic. Will obtain lab work, imaging, attempt to control symptoms and reassess.  Patient symptoms controlled here.  Tachycardia has improved slightly but remains after pain medication wore off.  CT findings consistent with diverticulitis with phlegmon formation.  No evidence of abscess.  Patient started on Zosyn, given more pain medication and will be admitted to hospitalist service.  Patient is agreeable to the plan.   All imaging, if done today, including plain films, CT scans, and ultrasounds, independently reviewed by me, and interpretations confirmed via formal radiology reads.  Portions of this note were generated with [employer]. Dictation errors may occur despite best attempts at proofreading.  Final Clinical Impression(s) / ED Diagnoses Final diagnoses:  Diverticulitis    Rx / DC Orders ED Discharge Orders    None       Delia Heady, PA-C 10/09/19 1415    Veryl Speak, MD 10/09/19 1622

## 2019-10-09 NOTE — ED Triage Notes (Signed)
Pt has LLQ pain. Sts hx of diverticulitis.

## 2019-10-09 NOTE — ED Notes (Signed)
Transport called.

## 2019-10-10 LAB — COMPREHENSIVE METABOLIC PANEL
ALT: 14 U/L (ref 0–44)
AST: 13 U/L — ABNORMAL LOW (ref 15–41)
Albumin: 3.8 g/dL (ref 3.5–5.0)
Alkaline Phosphatase: 52 U/L (ref 38–126)
Anion gap: 9 (ref 5–15)
BUN: 14 mg/dL (ref 6–20)
CO2: 22 mmol/L (ref 22–32)
Calcium: 8.9 mg/dL (ref 8.9–10.3)
Chloride: 107 mmol/L (ref 98–111)
Creatinine, Ser: 1.26 mg/dL — ABNORMAL HIGH (ref 0.61–1.24)
GFR calc Af Amer: >60 mL/min (ref >60)
GFR calc non Af Amer: >60 mL/min (ref >60)
Glucose, Bld: 103 mg/dL — ABNORMAL HIGH (ref 70–99)
Potassium: 3.5 mmol/L (ref 3.5–5.1)
Sodium: 138 mmol/L (ref 135–145)
Total Bilirubin: 1.6 mg/dL — ABNORMAL HIGH (ref 0.3–1.2)
Total Protein: 7.1 g/dL (ref 6.5–8.1)

## 2019-10-10 LAB — CBC
HCT: 40.9 % (ref 39.0–52.0)
Hemoglobin: 13.7 g/dL (ref 13.0–17.0)
MCH: 31 pg (ref 26.0–34.0)
MCHC: 33.5 g/dL (ref 30.0–36.0)
MCV: 92.5 fL (ref 80.0–100.0)
Platelets: 294 10*3/uL (ref 150–400)
RBC: 4.42 MIL/uL (ref 4.22–5.81)
RDW: 12.4 % (ref 11.5–15.5)
WBC: 11.7 10*3/uL — ABNORMAL HIGH (ref 4.0–10.5)
nRBC: 0 % (ref 0.0–0.2)

## 2019-10-10 MED ORDER — OXYCODONE-ACETAMINOPHEN 5-325 MG PO TABS
1.0000 | ORAL_TABLET | Freq: Four times a day (QID) | ORAL | Status: DC | PRN
  Administered 2019-10-10 – 2019-10-12 (×7): 2 via ORAL
  Filled 2019-10-10 (×7): qty 2

## 2019-10-10 MED ORDER — HYDROMORPHONE HCL 1 MG/ML IJ SOLN
1.0000 mg | Freq: Four times a day (QID) | INTRAMUSCULAR | Status: DC | PRN
  Administered 2019-10-10: 1 mg via INTRAVENOUS
  Filled 2019-10-10: qty 1

## 2019-10-10 MED ORDER — ACETAMINOPHEN 325 MG PO TABS: 650.0000 mg | ORAL_TABLET | Freq: Four times a day (QID) | ORAL | Status: DC | PRN

## 2019-10-10 MED ORDER — ENOXAPARIN SODIUM 100 MG/ML ~~LOC~~ SOLN
90.0000 mg | Freq: Two times a day (BID) | SUBCUTANEOUS | Status: DC
  Administered 2019-10-10 – 2019-10-11 (×3): 90 mg via SUBCUTANEOUS
  Filled 2019-10-10 (×3): qty 1

## 2019-10-10 MED ORDER — SUMATRIPTAN SUCCINATE 50 MG PO TABS
100.0000 mg | ORAL_TABLET | ORAL | Status: DC | PRN
  Filled 2019-10-10: qty 2

## 2019-10-10 MED ORDER — RIZATRIPTAN BENZOATE 10 MG PO TBDP: 10.0000 mg | ORAL_TABLET | ORAL | Status: DC | PRN

## 2019-10-10 MED ORDER — ACETAMINOPHEN 325 MG PO TABS
650.0000 mg | ORAL_TABLET | Freq: Four times a day (QID) | ORAL | Status: DC | PRN
  Filled 2019-10-10: qty 2

## 2019-10-10 MED ORDER — ACETAMINOPHEN 325 MG PO TABS
650.0000 mg | ORAL_TABLET | Freq: Once | ORAL | Status: AC
  Administered 2019-10-10: 650 mg via ORAL
  Filled 2019-10-10: qty 2

## 2019-10-10 NOTE — Progress Notes (Addendum)
PROGRESS NOTE  Benjamin [name] [name] DOB: February 05, 1971 DOA: 10/09/2019 PCP: Patient, No Pcp Per   LOS: 1 day   Brief narrative: As per HPI,  Benjamin [name] is a 49 y.o. male with past medical history of DVT, migraine, GERD, hyperlipidemia, history of diverticulitis in the past, GERD, presented to hospital with complaints of left lower quadrant pain for the last 2 days with chills.  He did have history of the diverticulitis in the past. He does have history of colonoscopy 5 years back when polyps were noted.  Patient is currently on Xarelto for anticoagulation with history of DVT.  He states that he has factor V Leiden mutation.  He has been seen by oncology as outpatient.  ED Course: In the ED, patient received IV fluids, Dilaudid, Zosyn and was considered for admission to the hospital.  CT scan of the abdomen and pelvis showed evidence of descending colon acute diverticulitis with phlegmon.  Hospitalist team was then consulted for admission to the hospital.  Assessment/Plan:  Principal Problem:   Diverticulitis Active Problems:   Essential hypertension   Benign prostatic hyperplasia with urinary obstruction   Acute descending colon diverticulitis with phlegmon formation. Previous history of recurrent diverticulitis.  CT scan shows phlegmon.    On clears, IV antibiotic with Zosyn IV, IV analgesia.  Pain has improved including  WBC today.  Will advance to full liquids today.  Will need GI evaluation as outpatient in 6 to 8 weeks for colonoscopy.  Patient follows up with GI as outpatient.  Patient might need a repeat CT scan/surgical evaluation if his pain worsens, spikes a fever, worsening leukocytosis or increasing tenderness.  History of hypertension.  Continue losartan.    Blood pressure better today.  History of migraine.  On rizatriptan and Trokendi as outpatient.  Has been resumed.  History of BPH.  Continue tamsulosin.  History of DVT on Xarelto.history of factor V  Leiden mutation.  We will hold the Xarelto and put on Lovenox therapeutic dose in case patient might need surgical intervention.  VTE Prophylaxis: Lovenox therapeutic dose.  Discontinue Xarelto.  Pharmacy to dose  Code Status: Full code  Family Communication: Spoke with patient's girlfriend at bedside  Status is: Inpatient  Remains inpatient appropriate because:Ongoing active pain requiring inpatient pain management, IV treatments appropriate due to intensity of illness or inability to take PO, Inpatient level of care appropriate due to severity of illness and Acute diverticulitis on IV antibiotic   Dispo: The patient is from: Home              Anticipated d/c is to: Home              Anticipated d/c date is: 2 days              Patient currently is not medically stable to d/c.  Consultants:  None  Procedures:  None  Antibiotics:  . Zosyn IV  Anti-infectives (From admission, onward)   Start     Dose/Rate Route Frequency Ordered Stop   10/09/19 2000  piperacillin-tazobactam (ZOSYN) IVPB 3.375 g     3.375 g 12.5 mL/hr over 240 Minutes Intravenous Every 8 hours 10/09/19 1404     10/09/19 1400  piperacillin-tazobactam (ZOSYN) IVPB 3.375 g     3.375 g 100 mL/hr over 30 Minutes Intravenous  Once 10/09/19 1349 10/09/19 1505   10/09/19 1345  piperacillin-tazobactam (ZOSYN) IVPB 3.375 g  Status:  Discontinued     3.375 g 100 mL/hr over 30  Minutes Intravenous  Once 10/09/19 1341 10/09/19 1345     Subjective: Today, patient was seen and examined at bedside.  Still complains of moderate pain.  Denies any nausea or vomiting.  Has been on clears.  No fever or chills.  Feels overall better than yesterday.  Objective: Vitals:   10/10/19 0118 10/10/19 0457  BP: 99/72 (!) 124/93  Pulse: 79 81  Resp: 17 15  Temp: 97.8 F (36.6 C) 97.6 F (36.4 C)  SpO2: 95% 96%    Intake/Output Summary (Last 24 hours) at 10/10/2019 1203 Last data filed at 10/10/2019 0416 Gross per 24 hour    Intake 1514.78 ml  Output 800 ml  Net 714.78 ml   Filed Weights   10/09/19 0700 10/10/19 0457  Weight: 86.2 kg 86.5 kg   Body mass index is 28.16 kg/m.   Physical Exam: GENERAL: Patient is alert awake and oriented. Not in obvious distress. HENT: No scleral pallor or icterus. Pupils equally reactive to light. Oral mucosa is moist NECK: is supple, no gross swelling noted. CHEST: Clear to auscultation. No crackles or wheezes.  Diminished breath sounds bilaterally. CVS: S1 and S2 heard, no murmur. Regular rate and rhythm.  ABDOMEN: Soft, left lower quadrant tenderness on palpation., bowel sounds are present. EXTREMITIES: No edema. CNS: Cranial nerves are intact. No focal motor deficits. SKIN: warm and dry without rashes.  Data Review: I have personally reviewed the following laboratory data and studies,  CBC: Recent Labs  Lab 10/09/19 1212 10/10/19 0538  WBC 20.4* 11.7*  NEUTROABS 16.8*  --   HGB 16.1 13.7  HCT 47.8 40.9  MCV 92.6 92.5  PLT 355 [id number]   Basic Metabolic Panel: Recent Labs  Lab 10/09/19 1212 10/10/19 0538  NA 141 138  K 4.0 3.5  CL 106 107  CO2 24 22  GLUCOSE 102* 103*  BUN 14 14  CREATININE 1.31* 1.26*  CALCIUM 9.4 8.9   Liver Function Tests: Recent Labs  Lab 10/09/19 1212 10/10/19 0538  AST 19 13*  ALT 17 14  ALKPHOS 67 52  BILITOT 1.9* 1.6*  PROT 8.4* 7.1  ALBUMIN 4.8 3.8   Recent Labs  Lab 10/09/19 1212  LIPASE 17   No results for input(s): AMMONIA in the last 168 hours. Cardiac Enzymes: No results for input(s): CKTOTAL, CKMB, CKMBINDEX, TROPONINI in the last 168 hours. BNP (last 3 results) No results for input(s): BNP in the last 8760 hours.  ProBNP (last 3 results) No results for input(s): PROBNP in the last 8760 hours.  CBG: No results for input(s): GLUCAP in the last 168 hours. Recent Results (from the past 240 hour(s))  SARS CORONAVIRUS 2 (TAT 6-24 HRS) Nasopharyngeal Nasopharyngeal Swab     Status: None   Collection  Time: 10/09/19  3:38 PM   Specimen: Nasopharyngeal Swab  Result Value Ref Range Status   SARS Coronavirus 2 NEGATIVE NEGATIVE Final    Comment: (NOTE) SARS-CoV-2 target nucleic acids are NOT DETECTED. The SARS-CoV-2 RNA is generally detectable in upper and lower respiratory specimens during the acute phase of infection. Negative results do not preclude SARS-CoV-2 infection, do not rule out co-infections with other pathogens, and should not be used as the sole basis for treatment or other patient management decisions. Negative results must be combined with clinical observations, patient history, and epidemiological information. The expected result is Negative. Fact Sheet for Patients: SugarRoll.be Fact Sheet for Healthcare Providers: https://www.woods-mathews.com/ This test is not yet approved or cleared by the [place] FDA  and  has been authorized for detection and/or diagnosis of SARS-CoV-2 by FDA under an Emergency Use Authorization (EUA). This EUA will remain  in effect (meaning this test can be used) for the duration of the COVID-19 declaration under Section 56 4(b)(1) of the Act, 21 U.S.C. section 360bbb-3(b)(1), unless the authorization is terminated or revoked sooner. Performed at [facility], [place] [address], [place], [place] [address]      Studies: CT ABDOMEN PELVIS W CONTRAST  Result Date: 10/09/2019 CLINICAL DATA:  Left lower quadrant abdominal pain. History of diverticulitis EXAM: CT ABDOMEN AND PELVIS WITH CONTRAST TECHNIQUE: Multidetector CT imaging of the abdomen and pelvis was performed using the standard protocol following bolus administration of intravenous contrast. CONTRAST:  18mL OMNIPAQUE IOHEXOL 300 MG/ML  SOLN COMPARISON:  02/08/2018 FINDINGS: Lower chest: No acute abnormality. Hepatobiliary: No focal liver abnormality is seen. No gallstones, gallbladder wall thickening, or biliary dilatation. Pancreas:  Unremarkable. No pancreatic ductal dilatation or surrounding inflammatory changes. Spleen: Normal in size without focal abnormality. Adrenals/Urinary Tract: Adrenal glands are unremarkable. Kidneys are normal, without renal calculi, focal lesion, or hydronephrosis. Bladder is unremarkable. Stomach/Bowel: Focally inflamed segment of mid descending colon where there is a prominent diverticula with prominent pericolonic fat stranding (series 2, image 39). There is a small amount of fluid along the anterior margin of the mid descending colon measuring approximately 2.8 x 2.8 x 1.0 cm without a well-defined enhancing rim (series 5, image 134). Numerous additional diverticula throughout the colon. Normal appendix within the right lower quadrant. No dilated loops of bowel to suggest obstruction. Vascular/Lymphatic: No significant vascular findings are present. No enlarged abdominal or pelvic lymph nodes. Reproductive: Prostate is unremarkable. Other: No extraluminal air/pneumoperitoneum. No well-defined abscess. No abdominal wall hernia. Musculoskeletal: No acute or significant osseous findings. IMPRESSION: Acute diverticulitis of the mid descending colon. There is a small ill-defined fluid collection along the anterior margin of the mid descending colon measuring up to 2.8 cm without a well-defined enhancing rim. Findings are favored to represent phlegmon. No extraluminal air/pneumoperitoneum. Electronically Signed   By: Davina Poke D.O.   On: 10/09/2019 13:33      Flora Lipps, MD  [facility] 10/10/2019

## 2019-10-10 NOTE — Progress Notes (Signed)
ANTICOAGULATION CONSULT NOTE - Initial Consult  Pharmacy Consult for enoxaparin Indication: DVT  Allergies  Allergen Reactions  . Bismuth Subsalicylate Swelling  . Compazine [Prochlorperazine Edisylate] Other (See Comments)    Cervical dystonia and panic attack    Patient Measurements: Height: 5\' 9"  (175.3 cm) Weight: 86.5 kg (190 lb 11.2 oz) IBW/kg (Calculated) : 70.7 Heparin Dosing Weight:   Vital Signs: Temp: 97.6 F (36.4 C) (05/11 0457) Temp Source: Oral (05/11 0457) BP: 124/93 (05/11 0457) Pulse Rate: 81 (05/11 0457)  Labs: Recent Labs    10/09/19 1212 10/10/19 0538  HGB 16.1 13.7  HCT 47.8 40.9  PLT 355 294  CREATININE 1.31* 1.26*    Estimated Creatinine Clearance: 76.4 mL/min (A) (by C-G formula based on SCr of 1.26 mg/dL (H)).   Medical History: Past Medical History:  Diagnosis Date  . Acute DVT of left tibial vein ([place]) 04/24/2017   Xarelto started (dx'd in emergency dept).  Dr. Marin Olp recommended full dose xarelto x 70mo, with an additional year of maintenance dose xarelto (as of 05/2017 initial consult note).  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  Pt Factor V leiden heterozygote.  Stable as of 09/2017 hem f/u.  . Adenomatous colon polyp 10/04/15  . ALLERGIC RHINITIS 07/11/2008  . Allergy    SEASONAL  . 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 Anxiety   . BACK PAIN, CHRONIC, INTERMITTENT 10/17/2008  . Barrett's esophagus determined by biopsy 04/12/14  . BENIGN PROSTATIC HYPERTROPHY, WITH URINARY OBSTRUCTION 07/21/2007  . BURSITIS, HIP 09/30/2009  . CAP (community acquired pneumonia) 06/2014  . Cataracts, bilateral 2016   Not visually significant  . Chronic headaches    Migraine syndrome: worsening fall 2018, neuro eval 04/2017--topamax and maxalt started, MRI brain ordered (LP to be done if MRI neg--pt reporting fevers).  Pt referred for sleep study.  . Chronic renal insufficiency, stage II (mild)    GFR 60s-70s  (suspect HTN and NSAID damage as etiology).  GFR dipped into 50s fall 2018 after lots of NSAID use for chronic daily HA's.  . Diverticulitis 2016   CT 01/2015  . Frequent headaches   . Gastric polyp 2006  . GERD without esophagitis   . Herpes labialis   . Hiatal hernia   . Hyperlipidemia   . HYPERTENSION 01/05/2007  . OSA (obstructive sleep apnea) 04/2017   Dr. Rexene Alberts eval 04/2017-plan for sleep study.  [facility] PSORIASIS 01/05/2007    Medications:  Scheduled:  . buPROPion  300 mg Oral Daily  . enoxaparin (LOVENOX) injection  90 mg Subcutaneous Q12H  . loratadine  10 mg Oral Daily  . losartan  25 mg Oral Daily  . [START ON 10/13/2019] rosuvastatin  40 mg Oral Q Fri  . tamsulosin  0.4 mg Oral Daily  . topiramate  12.5 mg Oral BID    Assessment: Pharmacy is consulted to dose enoxaparin in 49 yo with MPH of DVT and Factor V Leiden. Pt previously on Xarelto but is being transitione d to enoxaparin in case pt needs surgical intervention.    Today, 10/10/19   Wt 86.5 kg  Scr 1.26 mg/dl Crcl 76 ml/min  CBC WNL    Goal of Therapy:  Anti-Xa level 0.6-1 units/ml 4hrs after LMWH dose given Monitor platelets by anticoagulation protocol: Yes   Plan:   Enoxaparin 90 mg SQ Q12h   Monitor CBC, renal function as available  Monitor for signs and symptoms of bleeding  Royetta Asal,  PharmD, BCPS 10/10/2019 1:38 PM

## 2019-10-11 DIAGNOSIS — D6851 Activated protein C resistance: Secondary | ICD-10-CM

## 2019-10-11 LAB — MAGNESIUM: Magnesium: 2.3 mg/dL (ref 1.7–2.4)

## 2019-10-11 LAB — PHOSPHORUS: Phosphorus: 3.4 mg/dL (ref 2.5–4.6)

## 2019-10-11 LAB — CBC
HCT: 39.8 % (ref 39.0–52.0)
Hemoglobin: 12.9 g/dL — ABNORMAL LOW (ref 13.0–17.0)
MCH: 30.1 pg (ref 26.0–34.0)
MCHC: 32.4 g/dL (ref 30.0–36.0)
MCV: 92.8 fL (ref 80.0–100.0)
Platelets: 292 10*3/uL (ref 150–400)
RBC: 4.29 MIL/uL (ref 4.22–5.81)
RDW: 12.3 % (ref 11.5–15.5)
WBC: 7.1 10*3/uL (ref 4.0–10.5)
nRBC: 0 % (ref 0.0–0.2)

## 2019-10-11 LAB — COMPREHENSIVE METABOLIC PANEL
ALT: 13 U/L (ref 0–44)
AST: 13 U/L — ABNORMAL LOW (ref 15–41)
Albumin: 3.4 g/dL — ABNORMAL LOW (ref 3.5–5.0)
Alkaline Phosphatase: 51 U/L (ref 38–126)
Anion gap: 9 (ref 5–15)
BUN: 12 mg/dL (ref 6–20)
CO2: 23 mmol/L (ref 22–32)
Calcium: 8.5 mg/dL — ABNORMAL LOW (ref 8.9–10.3)
Chloride: 106 mmol/L (ref 98–111)
Creatinine, Ser: 1.29 mg/dL — ABNORMAL HIGH (ref 0.61–1.24)
GFR calc Af Amer: >60 mL/min (ref >60)
GFR calc non Af Amer: >60 mL/min (ref >60)
Glucose, Bld: 93 mg/dL (ref 70–99)
Potassium: 3.6 mmol/L (ref 3.5–5.1)
Sodium: 138 mmol/L (ref 135–145)
Total Bilirubin: 0.7 mg/dL (ref 0.3–1.2)
Total Protein: 6.6 g/dL (ref 6.5–8.1)

## 2019-10-11 LAB — HIV ANTIBODY (ROUTINE TESTING W REFLEX): HIV Screen 4th Generation wRfx: NONREACTIVE

## 2019-10-11 MED ORDER — AMOXICILLIN-POT CLAVULANATE 875-125 MG PO TABS
1.0000 | ORAL_TABLET | Freq: Two times a day (BID) | ORAL | Status: DC
  Administered 2019-10-11 – 2019-10-12 (×2): 1 via ORAL
  Filled 2019-10-11 (×2): qty 1

## 2019-10-11 MED ORDER — HYDRALAZINE HCL 25 MG PO TABS: 25.0000 mg | ORAL_TABLET | Freq: Three times a day (TID) | ORAL | Status: DC | PRN

## 2019-10-11 NOTE — Plan of Care (Signed)
Pt diastolic BP elevated.  Asymptomatic at this time and may be correlated to his left sided pain which persists.  Problem: Education: Goal: Knowledge of General Education information will improve Description: Including pain rating scale, medication(s)/side effects and non-pharmacologic comfort measures Outcome: Progressing   Problem: Health Behavior/Discharge Planning: Goal: Ability to manage health-related needs will improve Outcome: Progressing   Problem: Clinical Measurements: Goal: Ability to maintain clinical measurements within normal limits will improve Outcome: Progressing Goal: Will remain free from infection Outcome: Progressing Goal: Diagnostic test results will improve Outcome: Progressing Goal: Respiratory complications will improve Outcome: Progressing Goal: Cardiovascular complication will be avoided Outcome: Progressing   Problem: Activity: Goal: Risk for activity intolerance will decrease Outcome: Progressing   Problem: Nutrition: Goal: Adequate nutrition will be maintained Outcome: Progressing   Problem: Elimination: Goal: Will not experience complications related to bowel motility Outcome: Progressing Goal: Will not experience complications related to urinary retention Outcome: Progressing   Problem: Pain Managment: Goal: General experience of comfort will improve Outcome: Progressing   Problem: Safety: Goal: Ability to remain free from injury will improve Outcome: Progressing   Problem: Skin Integrity: Goal: Risk for impaired skin integrity will decrease Outcome: Progressing   Problem: Coping: Goal: Level of anxiety will decrease Outcome: Not Progressing

## 2019-10-11 NOTE — Progress Notes (Signed)
[facility]  PROGRESS NOTE    DALLIN DARRAGH  [id number] DOB: 12/18/1970 DOA: 10/09/2019 PCP: Patient, No Pcp Per   Brief Narrative:   Daleen Bo Cuddebackis a 49 y.o.malewith past medical history of DVT, migraine, GERD, hyperlipidemia,history of diverticulitis in the past, GERD, presented to hospital with complaints ofleft lower quadrant pain for the last 2 days with chills.  He did have history of the diverticulitis in the past.He does have history of colonoscopy 5 years back when polyps were noted. Patient is currently on Xarelto for anticoagulation with history of DVT. He states that he has factor V Leiden mutation. He has been seen by oncology as outpatient. ED Course:In the ED, patient received IV fluids, Dilaudid, Zosyn and was considered for admission to the hospital.CT scan of the abdomen and pelvis showed evidence of descending colon acute diverticulitis with phlegmon. Hospitalist team was then consulted for admission to the hospital.   Assessment & Plan:   Principal Problem:   Diverticulitis Active Problems:   Essential hypertension   Benign prostatic hyperplasia with urinary obstruction  Acutedescending colondiverticulitis with phlegmon formation     - Previous history ofrecurrentdiverticulitis.      - CT scan shows phlegmon.        - On clears, IV antibiotic with Zosyn IV, IV analgesia.     - Will need GI evaluation as outpatient in 6 to 8 weeks for colonoscopy.     - 5/12: afebrile. White count is normalized. Remains on zosyn. Transition to augmentin this evening. Advance diet a tolerated. Likely d/c in AM.  Hypertension.      - Continue losartan.      - 5/12: PRN hydralazine added. May need to increase losartan.   History of migraine.      -On rizatriptan and Trokendi as outpatient.  Has been resumed.  BPH      - Continue tamsulosin.  History of DVT on Xarelto History of factor V Leiden mutation     - Xarelto was held and he was placed on  therapeutic lovenox d/t possible need for surgical intervention     - 5/12: doesn't look like he will be needing surgery; can resume xarelto in the AM  DVT prophylaxis: lovenox Code Status: FULL Family Communication: Spoke with wife at bedside.   Status is: Inpatient  Remains inpatient appropriate because:IV treatments appropriate due to intensity of illness or inability to take PO   Dispo: The patient is from: Home              Anticipated d/c is to: Home              Anticipated d/c date is: 1 day              Patient currently is not medically stable to d/c.  Antimicrobials:  . Zosyn   ROS:  Reports ab pain. Denies CP, dyspnea . Remainder 10-pt ROS is negative for all not previously mentioned.  Subjective: "It's painful under that rib."  Objective: Vitals:   10/10/19 2059 10/11/19 0500 10/11/19 0546 10/11/19 1327  BP: 129/84  (!) 125/101 (!) 166/119  Pulse: 90  89 99  Resp: 16  16 18   Temp: 98 F (36.7 C)  97.6 F (36.4 C) 98.6 F (37 C)  TempSrc: Oral  Oral Oral  SpO2: 97%  97% 98%  Weight:  86 kg    Height:        Intake/Output Summary (Last 24 hours) at 10/11/2019 1552 Last data filed  at 10/11/2019 1425 Gross per 24 hour  Intake 2631.69 ml  Output 500 ml  Net 2131.69 ml   Filed Weights   10/09/19 0700 10/10/19 0457 10/11/19 0500  Weight: 86.2 kg 86.5 kg 86 kg    Examination:  General: 49 y.o. male resting in bed in NAD Cardiovascular: RRR, +S1, S2, no m/g/r, equal pulses throughout Respiratory: CTABL, no w/r/r, normal WOB GI: BS+, ND, LUQ TTP mild, no masses noted, no organomegaly noted MSK: No e/c/c Neuro: A&O x 3, no focal deficits Psyc: Appropriate interaction and affect, calm/cooperative   Data Reviewed: I have personally reviewed following labs and imaging studies.  CBC: Recent Labs  Lab 10/09/19 1212 10/10/19 0538 10/11/19 0527  WBC 20.4* 11.7* 7.1  NEUTROABS 16.8*  --   --   HGB 16.1 13.7 12.9*  HCT 47.8 40.9 39.8  MCV 92.6 92.5  92.8  PLT 355 294 [id number]   Basic Metabolic Panel: Recent Labs  Lab 10/09/19 1212 10/10/19 0538 10/11/19 0527  NA 141 138 138  K 4.0 3.5 3.6  CL 106 107 106  CO2 24 22 23   GLUCOSE 102* 103* 93  BUN 14 14 12   CREATININE 1.31* 1.26* 1.29*  CALCIUM 9.4 8.9 8.5*  MG  --   --  2.3  PHOS  --   --  3.4   GFR: Estimated Creatinine Clearance: 74.4 mL/min (A) (by C-G formula based on SCr of 1.29 mg/dL (H)). Liver Function Tests: Recent Labs  Lab 10/09/19 1212 10/10/19 0538 10/11/19 0527  AST 19 13* 13*  ALT 17 14 13   ALKPHOS 67 52 51  BILITOT 1.9* 1.6* 0.7  PROT 8.4* 7.1 6.6  ALBUMIN 4.8 3.8 3.4*   Recent Labs  Lab 10/09/19 1212  LIPASE 17   No results for input(s): AMMONIA in the last 168 hours. Coagulation Profile: No results for input(s): INR, PROTIME in the last 168 hours. Cardiac Enzymes: No results for input(s): CKTOTAL, CKMB, CKMBINDEX, TROPONINI in the last 168 hours. BNP (last 3 results) No results for input(s): PROBNP in the last 8760 hours. HbA1C: No results for input(s): HGBA1C in the last 72 hours. CBG: No results for input(s): GLUCAP in the last 168 hours. Lipid Profile: No results for input(s): CHOL, HDL, LDLCALC, TRIG, CHOLHDL, LDLDIRECT in the last 72 hours. Thyroid Function Tests: No results for input(s): TSH, T4TOTAL, FREET4, T3FREE, THYROIDAB in the last 72 hours. Anemia Panel: No results for input(s): VITAMINB12, FOLATE, FERRITIN, TIBC, IRON, RETICCTPCT in the last 72 hours. Sepsis Labs: No results for input(s): PROCALCITON, LATICACIDVEN in the last 168 hours.  Recent Results (from the past 240 hour(s))  SARS CORONAVIRUS 2 (TAT 6-24 HRS) Nasopharyngeal Nasopharyngeal Swab     Status: None   Collection Time: 10/09/19  3:38 PM   Specimen: Nasopharyngeal Swab  Result Value Ref Range Status   SARS Coronavirus 2 NEGATIVE NEGATIVE Final    Comment: (NOTE) SARS-CoV-2 target nucleic acids are NOT DETECTED. The SARS-CoV-2 RNA is generally detectable  in upper and lower respiratory specimens during the acute phase of infection. Negative results do not preclude SARS-CoV-2 infection, do not rule out co-infections with other pathogens, and should not be used as the sole basis for treatment or other patient management decisions. Negative results must be combined with clinical observations, patient history, and epidemiological information. The expected result is Negative. Fact Sheet for Patients: SugarRoll.be Fact Sheet for Healthcare Providers: https://www.woods-mathews.com/ This test is not yet approved or cleared by the [place] FDA and  has  been authorized for detection and/or diagnosis of SARS-CoV-2 by FDA under an Emergency Use Authorization (EUA). This EUA will remain  in effect (meaning this test can be used) for the duration of the COVID-19 declaration under Section 56 4(b)(1) of the Act, 21 U.S.C. section 360bbb-3(b)(1), unless the authorization is terminated or revoked sooner. Performed at [facility], [place] [address] [place], [place] [address]       Radiology Studies: No results found.   Scheduled Meds: . amoxicillin-clavulanate  1 tablet Oral Q12H  . buPROPion  300 mg Oral Daily  . enoxaparin (LOVENOX) injection  90 mg Subcutaneous Q12H  . loratadine  10 mg Oral Daily  . losartan  25 mg Oral Daily  . [START ON 10/13/2019] rosuvastatin  40 mg Oral Q Fri  . tamsulosin  0.4 mg Oral Daily  . topiramate  12.5 mg Oral BID   Continuous Infusions: . sodium chloride 100 mL/hr at 10/11/19 1354  . piperacillin-tazobactam (ZOSYN)  IV 3.375 g (10/11/19 1250)     LOS: 2 days    Time spent: 25 minutes spent in the coordination of care today.    Jonnie Finner, DO [facility]  If 7PM-7AM, please contact night-coverage [email or web address] 10/11/2019, 3:52 PM

## 2019-10-12 DIAGNOSIS — G43709 Chronic migraine without aura, not intractable, without status migrainosus: Secondary | ICD-10-CM

## 2019-10-12 LAB — CBC WITH DIFFERENTIAL/PLATELET
Abs Immature Granulocytes: 0.04 10*3/uL (ref 0.00–0.07)
Basophils Absolute: 0.1 10*3/uL (ref 0.0–0.1)
Basophils Relative: 2 %
Eosinophils Absolute: 0.3 10*3/uL (ref 0.0–0.5)
Eosinophils Relative: 5 %
HCT: 42.3 % (ref 39.0–52.0)
Hemoglobin: 14 g/dL (ref 13.0–17.0)
Immature Granulocytes: 1 %
Lymphocytes Relative: 31 %
Lymphs Abs: 1.7 10*3/uL (ref 0.7–4.0)
MCH: 31.2 pg (ref 26.0–34.0)
MCHC: 33.1 g/dL (ref 30.0–36.0)
MCV: 94.2 fL (ref 80.0–100.0)
Monocytes Absolute: 0.5 10*3/uL (ref 0.1–1.0)
Monocytes Relative: 9 %
Neutro Abs: 2.8 10*3/uL (ref 1.7–7.7)
Neutrophils Relative %: 52 %
Platelets: 320 10*3/uL (ref 150–400)
RBC: 4.49 MIL/uL (ref 4.22–5.81)
RDW: 12.1 % (ref 11.5–15.5)
WBC: 5.3 10*3/uL (ref 4.0–10.5)
nRBC: 0 % (ref 0.0–0.2)

## 2019-10-12 LAB — MAGNESIUM: Magnesium: 2.2 mg/dL (ref 1.7–2.4)

## 2019-10-12 LAB — RENAL FUNCTION PANEL
Albumin: 3.6 g/dL (ref 3.5–5.0)
Anion gap: 6 (ref 5–15)
BUN: 12 mg/dL (ref 6–20)
CO2: 25 mmol/L (ref 22–32)
Calcium: 8.6 mg/dL — ABNORMAL LOW (ref 8.9–10.3)
Chloride: 107 mmol/L (ref 98–111)
Creatinine, Ser: 1.17 mg/dL (ref 0.61–1.24)
GFR calc Af Amer: >60 mL/min (ref >60)
GFR calc non Af Amer: >60 mL/min (ref >60)
Glucose, Bld: 86 mg/dL (ref 70–99)
Phosphorus: 3.7 mg/dL (ref 2.5–4.6)
Potassium: 3.7 mmol/L (ref 3.5–5.1)
Sodium: 138 mmol/L (ref 135–145)

## 2019-10-12 MED ORDER — LOSARTAN POTASSIUM 25 MG PO TABS: 50.0000 mg | ORAL_TABLET | Freq: Every day | ORAL | 0 refills | Status: DC

## 2019-10-12 MED ORDER — LOSARTAN POTASSIUM 50 MG PO TABS
50.0000 mg | ORAL_TABLET | Freq: Every day | ORAL | Status: DC
  Administered 2019-10-12: 50 mg via ORAL
  Filled 2019-10-12: qty 1

## 2019-10-12 MED ORDER — RIVAROXABAN 10 MG PO TABS: 10.0000 mg | ORAL_TABLET | Freq: Every day | ORAL | 0 refills | Status: DC

## 2019-10-12 MED ORDER — AMOXICILLIN-POT CLAVULANATE 875-125 MG PO TABS: 1.0000 | ORAL_TABLET | Freq: Two times a day (BID) | ORAL | 0 refills | Status: AC

## 2019-10-12 MED ORDER — HYDROCODONE-ACETAMINOPHEN 7.5-325 MG PO TABS: 1.0000 | ORAL_TABLET | Freq: Three times a day (TID) | ORAL | 0 refills | Status: AC | PRN

## 2019-10-12 NOTE — Progress Notes (Signed)
Patient discharged to home with family, discharge instructions reviewed with patient. New RX's electronically sent to pharmacy.

## 2019-10-12 NOTE — Discharge Summary (Signed)
. Physician Discharge Summary  Benjamin [name] [id number] DOB: 1971/04/13 DOA: 10/09/2019  PCP: Patient, No Pcp Per  Admit date: 10/09/2019 Discharge date: 10/12/2019  Admitted From: Home Disposition:  Discharged to home.   Recommendations for Outpatient Follow-up:  1. Follow up with PCP in 1-2 weeks 2. Follow up with GI within next 6 - 8 weeks.  Discharge Condition: Stable  CODE STATUS: FULL   Brief/Interim Summary: Benjamin Baloun Cuddebackis a 49 y.o.malewith pastmedical history of DVT, migraine, GERD, hyperlipidemia,history of diverticulitis in the past, GERD, presented to hospital with complaints ofleft lower quadrant pain for the last 2 dayswith chills. He did have history of the diverticulitis in the past.He does have history of colonoscopy 5 years back when polyps were noted. Patient is currently on Xarelto for anticoagulation with history of DVT. He states that he has factor V Leiden mutation. He has been seen by oncology as outpatient. ED Course:In the ED, patient received IV fluids, Dilaudid, Zosyn and was considered for admission to the hospital.CT scan of the abdomen and pelvis showed evidence of descendingcolon acute diverticulitis with phlegmon. Hospitalist team was then consulted for admission to the hospital.  5/13: Feels better today. Transitioned to augmentin last night. Tolerating. Tolerating current diet. Recommend bland diet for the next several days. Wil be on augmentin for another 8 days. Needs to follow up with GI in 6 - 8 weeks. Had to increase his losartan to 50mg . Needs to follow up with his PCP in next week for BP check and lab work. Have discussed discharge plan in full detail with pt and wife. They have voiced understanding and agreement. He will be d/c'd to home today.   Discharge Diagnoses:  Principal Problem:   Diverticulitis Active Problems:   Essential hypertension   Benign prostatic hyperplasia with urinary obstruction  Acutedescending  colondiverticulitis with phlegmon formation     - Previous history ofrecurrentdiverticulitis.      - CT scan shows phlegmon.        - On clears, IV antibiotic with Zosyn IV, IV analgesia.     - Will need GI evaluation as outpatient in 6 to 8 weeks for colonoscopy.     - 5/12: afebrile. White count is normalized. Remains on zosyn. Transition to augmentin this evening. Advance diet a tolerated. Likely d/c in AM.     - 5/13: afebrile, diet tolerated, pain improved, ok to go home     - recommend bland diet for next several days; will have augmentin for another 8 days.  Hypertension.      - Continue losartan.      - 5/12: PRN hydralazine added. May need to increase losartan.      - 5/13: increase losartan to 50mg , follow up with PCP  History of migraine.      -On rizatriptan and Trokendi as outpatient.  Has been resumed.  BPH      - Continue tamsulosin.  History of DVT on Xarelto History of factor V Leiden mutation     - Xarelto was held and he was placed on therapeutic lovenox d/t possible need for surgical intervention     - 5/12: doesn't look like he will be needing surgery; can resume xarelto in the AM     - 5/13: resume home dosing at discharge today.  Discharge Instructions   Allergies as of 10/12/2019      Reactions   Bismuth Subsalicylate Swelling   Compazine [prochlorperazine Edisylate] Other (See Comments)   Cervical dystonia and panic  attack      Medication List    TAKE these medications   acetaminophen 325 MG tablet Commonly known as: TYLENOL Take 650 mg by mouth every 6 (six) hours as needed for mild pain or headache.   amoxicillin-clavulanate 875-125 MG tablet Commonly known as: AUGMENTIN Take 1 tablet by mouth every 12 (twelve) hours for 8 days.   buPROPion 300 MG 24 hr tablet Commonly known as: WELLBUTRIN XL Take 1 tablet (300 mg total) by mouth daily.   cetirizine-pseudoephedrine 5-120 MG tablet Commonly known as: ZYRTEC-D Take 1 tablet by  mouth daily.   fluticasone 50 MCG/ACT nasal spray Commonly known as: FLONASE Place 2 sprays into both nostrils daily. What changed:   when to take this  reasons to take this   HYDROcodone-acetaminophen 7.5-325 MG tablet Commonly known as: NORCO Take 1 tablet by mouth every 8 (eight) hours as needed for up to 3 days for moderate pain.   hyoscyamine 0.125 MG SL tablet Commonly known as: LEVSIN SL Place 1 tablet (0.125 mg total) under the tongue every 4 (four) hours as needed for cramping.   losartan 25 MG tablet Commonly known as: COZAAR Take 2 tablets (50 mg total) by mouth daily. What changed: how much to take   rivaroxaban 10 MG Tabs tablet Commonly known as: XARELTO Take 1 tablet (10 mg total) by mouth daily.   rizatriptan 10 MG disintegrating tablet Commonly known as: MAXALT-MLT Take 1 tablet (10 mg total) as needed by mouth for migraine. May repeat in 2 hours if needed   rosuvastatin 40 MG tablet Commonly known as: CRESTOR Take 1 tablet (40 mg total) by mouth once a week.   tamsulosin 0.4 MG Caps capsule Commonly known as: FLOMAX Take 1 capsule (0.4 mg total) by mouth daily.   tiZANidine 4 MG tablet Commonly known as: Zanaflex Take 1 tablet (4 mg total) every 6 (six) hours as needed by mouth for muscle spasms. Or for headaches.   Trokendi XR 25 MG Cp24 Generic drug: Topiramate ER TAKE 2 CAPSULES BY MOUTH EVERY DAY What changed: how much to take   valACYclovir 500 MG tablet Commonly known as: VALTREX Take 1 tablet (500 mg total) by mouth daily. What changed:   when to take this  reasons to take this      Follow-up Information    Armbruster, Carlota Raspberry, MD Follow up in 6 week(s).   Specialty: Gastroenterology Why: Follow up for possible colonoscopy Contact information: [address] [place] [address] [phone]        PCP Follow up in 1 week(s).   Why: Follow up with your PCP for labs and BP check.         Allergies   Allergen Reactions  . Bismuth Subsalicylate Swelling  . Compazine [Prochlorperazine Edisylate] Other (See Comments)    Cervical dystonia and panic attack    Consultations:  None   Procedures/Studies: CT ABDOMEN PELVIS W CONTRAST  Result Date: 10/09/2019 CLINICAL DATA:  Left lower quadrant abdominal pain. History of diverticulitis EXAM: CT ABDOMEN AND PELVIS WITH CONTRAST TECHNIQUE: Multidetector CT imaging of the abdomen and pelvis was performed using the standard protocol following bolus administration of intravenous contrast. CONTRAST:  116mL OMNIPAQUE IOHEXOL 300 MG/ML  SOLN COMPARISON:  02/08/2018 FINDINGS: Lower chest: No acute abnormality. Hepatobiliary: No focal liver abnormality is seen. No gallstones, gallbladder wall thickening, or biliary dilatation. Pancreas: Unremarkable. No pancreatic ductal dilatation or surrounding inflammatory changes. Spleen: Normal in size without focal abnormality.  Adrenals/Urinary Tract: Adrenal glands are unremarkable. Kidneys are normal, without renal calculi, focal lesion, or hydronephrosis. Bladder is unremarkable. Stomach/Bowel: Focally inflamed segment of mid descending colon where there is a prominent diverticula with prominent pericolonic fat stranding (series 2, image 39). There is a small amount of fluid along the anterior margin of the mid descending colon measuring approximately 2.8 x 2.8 x 1.0 cm without a well-defined enhancing rim (series 5, image 134). Numerous additional diverticula throughout the colon. Normal appendix within the right lower quadrant. No dilated loops of bowel to suggest obstruction. Vascular/Lymphatic: No significant vascular findings are present. No enlarged abdominal or pelvic lymph nodes. Reproductive: Prostate is unremarkable. Other: No extraluminal air/pneumoperitoneum. No well-defined abscess. No abdominal wall hernia. Musculoskeletal: No acute or significant osseous findings. IMPRESSION: Acute diverticulitis of the mid  descending colon. There is a small ill-defined fluid collection along the anterior margin of the mid descending colon measuring up to 2.8 cm without a well-defined enhancing rim. Findings are favored to represent phlegmon. No extraluminal air/pneumoperitoneum. Electronically Signed   By: Davina Poke D.O.   On: 10/09/2019 13:33      Subjective: "I feel pretty good."  Discharge Exam: Vitals:   10/11/19 2040 10/12/19 0558  BP: (!) 142/106 (!) 167/112  Pulse: 85 85  Resp: 16 16  Temp: 98 F (36.7 C) (!) 97.4 F (36.3 C)  SpO2: 97% 99%   Vitals:   10/11/19 0546 10/11/19 1327 10/11/19 2040 10/12/19 0558  BP: (!) 125/101 (!) 166/119 (!) 142/106 (!) 167/112  Pulse: 89 99 85 85  Resp: 16 18 16 16   Temp: 97.6 F (36.4 C) 98.6 F (37 C) 98 F (36.7 C) (!) 97.4 F (36.3 C)  TempSrc: Oral Oral Oral Oral  SpO2: 97% 98% 97% 99%  Weight:      Height:       General: 49 y.o. male resting in bed in NAD Cardiovascular: RRR, +S1, S2, no m/g/r, equal pulses throughout Respiratory: CTABL, no w/r/r, normal WOB GI: BS+, NDNT, no masses noted, no organomegaly noted MSK: No e/c/c Neuro: A&O x 3, no focal deficits Psyc: Appropriate interaction and affect, calm/cooperative   The results of significant diagnostics from this hospitalization (including imaging, microbiology, ancillary and laboratory) are listed below for reference.     Microbiology: Recent Results (from the past 240 hour(s))  SARS CORONAVIRUS 2 (TAT 6-24 HRS) Nasopharyngeal Nasopharyngeal Swab     Status: None   Collection Time: 10/09/19  3:38 PM   Specimen: Nasopharyngeal Swab  Result Value Ref Range Status   SARS Coronavirus 2 NEGATIVE NEGATIVE Final    Comment: (NOTE) SARS-CoV-2 target nucleic acids are NOT DETECTED. The SARS-CoV-2 RNA is generally detectable in upper and lower respiratory specimens during the acute phase of infection. Negative results do not preclude SARS-CoV-2 infection, do not rule  out co-infections with other pathogens, and should not be used as the sole basis for treatment or other patient management decisions. Negative results must be combined with clinical observations, patient history, and epidemiological information. The expected result is Negative. Fact Sheet for Patients: SugarRoll.be Fact Sheet for Healthcare Providers: https://www.woods-mathews.com/ This test is not yet approved or cleared by the [place] FDA and  has been authorized for detection and/or diagnosis of SARS-CoV-2 by FDA under an Emergency Use Authorization (EUA). This EUA will remain  in effect (meaning this test can be used) for the duration of the COVID-19 declaration under Section 56 4(b)(1) of the Act, 21 U.S.C. section 360bbb-3(b)(1), unless the  authorization is terminated or revoked sooner. Performed at [facility], [place] [address], [place], [place] [address]      Labs: BNP (last 3 results) No results for input(s): BNP in the last 8760 hours. Basic Metabolic Panel: Recent Labs  Lab 10/09/19 1212 10/10/19 0538 10/11/19 0527 10/12/19 0532  NA 141 138 138 138  K 4.0 3.5 3.6 3.7  CL 106 107 106 107  CO2 24 22 23 25   GLUCOSE 102* 103* 93 86  BUN 14 14 12 12   CREATININE 1.31* 1.26* 1.29* 1.17  CALCIUM 9.4 8.9 8.5* 8.6*  MG  --   --  2.3 2.2  PHOS  --   --  3.4 3.7   Liver Function Tests: Recent Labs  Lab 10/09/19 1212 10/10/19 0538 10/11/19 0527 10/12/19 0532  AST 19 13* 13*  --   ALT 17 14 13   --   ALKPHOS 67 52 51  --   BILITOT 1.9* 1.6* 0.7  --   PROT 8.4* 7.1 6.6  --   ALBUMIN 4.8 3.8 3.4* 3.6   Recent Labs  Lab 10/09/19 1212  LIPASE 17   No results for input(s): AMMONIA in the last 168 hours. CBC: Recent Labs  Lab 10/09/19 1212 10/10/19 0538 10/11/19 0527 10/12/19 0532  WBC 20.4* 11.7* 7.1 5.3  NEUTROABS 16.8*  --   --  2.8  HGB 16.1 13.7 12.9* 14.0  HCT 47.8 40.9 39.8 42.3  MCV 92.6 92.5  92.8 94.2  PLT 355 294 292 320   Cardiac Enzymes: No results for input(s): CKTOTAL, CKMB, CKMBINDEX, TROPONINI in the last 168 hours. BNP: Invalid input(s): POCBNP CBG: No results for input(s): GLUCAP in the last 168 hours. D-Dimer No results for input(s): DDIMER in the last 72 hours. Hgb A1c No results for input(s): HGBA1C in the last 72 hours. Lipid Profile No results for input(s): CHOL, HDL, LDLCALC, TRIG, CHOLHDL, LDLDIRECT in the last 72 hours. Thyroid function studies No results for input(s): TSH, T4TOTAL, T3FREE, THYROIDAB in the last 72 hours.  Invalid input(s): FREET3 Anemia work up No results for input(s): VITAMINB12, FOLATE, FERRITIN, TIBC, IRON, RETICCTPCT in the last 72 hours. Urinalysis    Component Value Date/Time   COLORURINE YELLOW 09/02/2015 1443   APPEARANCEUR CLEAR 09/02/2015 1443   LABSPEC 1.025 09/02/2015 1443   PHURINE 5.5 09/02/2015 1443   GLUCOSEU NEGATIVE 09/02/2015 1443   HGBUR SMALL (A) 09/02/2015 1443   BILIRUBINUR negative 04/05/2018 1102   BILIRUBINUR negative 04/26/2017 1448   KETONESUR negative 04/05/2018 1102   KETONESUR NEGATIVE 09/02/2015 1443   PROTEINUR negative 04/05/2018 1102   PROTEINUR negative 04/26/2017 1448   PROTEINUR NEGATIVE 06/15/2009 0016   UROBILINOGEN 0.2 04/05/2018 1102   UROBILINOGEN 0.2 09/02/2015 1443   NITRITE Negative 04/05/2018 1102   NITRITE negative 04/26/2017 1448   NITRITE NEGATIVE 09/02/2015 1443   LEUKOCYTESUR Negative 04/05/2018 1102   Sepsis Labs Invalid input(s): PROCALCITONIN,  WBC,  LACTICIDVEN Microbiology Recent Results (from the past 240 hour(s))  SARS CORONAVIRUS 2 (TAT 6-24 HRS) Nasopharyngeal Nasopharyngeal Swab     Status: None   Collection Time: 10/09/19  3:38 PM   Specimen: Nasopharyngeal Swab  Result Value Ref Range Status   SARS Coronavirus 2 NEGATIVE NEGATIVE Final    Comment: (NOTE) SARS-CoV-2 target nucleic acids are NOT DETECTED. The SARS-CoV-2 RNA is generally detectable in  upper and lower respiratory specimens during the acute phase of infection. Negative results do not preclude SARS-CoV-2 infection, do not rule out co-infections with  other pathogens, and should not be used as the sole basis for treatment or other patient management decisions. Negative results must be combined with clinical observations, patient history, and epidemiological information. The expected result is Negative. Fact Sheet for Patients: SugarRoll.be Fact Sheet for Healthcare Providers: https://www.woods-mathews.com/ This test is not yet approved or cleared by the [place] FDA and  has been authorized for detection and/or diagnosis of SARS-CoV-2 by FDA under an Emergency Use Authorization (EUA). This EUA will remain  in effect (meaning this test can be used) for the duration of the COVID-19 declaration under Section 56 4(b)(1) of the Act, 21 U.S.C. section 360bbb-3(b)(1), unless the authorization is terminated or revoked sooner. Performed at [facility], [place] [address], [place], [place] [address]      Time coordinating discharge: Over 35 minutes  SIGNED:   Jonnie Finner, DO  [facility] 10/12/2019, 7:32 AM   If 7PM-7AM, please contact night-coverage [email or web address]

## 2019-12-08 ENCOUNTER — Other Ambulatory Visit: Payer: Self-pay

## 2019-12-08 ENCOUNTER — Encounter (HOSPITAL_COMMUNITY): Payer: Self-pay | Admitting: Emergency Medicine

## 2019-12-08 ENCOUNTER — Emergency Department (HOSPITAL_COMMUNITY)
Admission: EM | Admit: 2019-12-08 | Discharge: 2019-12-09 | Disposition: A | Discharge status: Home / Self Care | Payer: 59 | Attending: Emergency Medicine | Admitting: Emergency Medicine

## 2019-12-08 DIAGNOSIS — Z7901 Long term (current) use of anticoagulants: Secondary | ICD-10-CM | POA: Insufficient documentation

## 2019-12-08 DIAGNOSIS — N182 Chronic kidney disease, stage 2 (mild): Secondary | ICD-10-CM | POA: Diagnosis not present

## 2019-12-08 DIAGNOSIS — K5792 Diverticulitis of intestine, part unspecified, without perforation or abscess without bleeding: Secondary | ICD-10-CM

## 2019-12-08 DIAGNOSIS — I129 Hypertensive chronic kidney disease with stage 1 through stage 4 chronic kidney disease, or unspecified chronic kidney disease: Secondary | ICD-10-CM | POA: Diagnosis not present

## 2019-12-08 DIAGNOSIS — Z79899 Other long term (current) drug therapy: Secondary | ICD-10-CM | POA: Diagnosis not present

## 2019-12-08 DIAGNOSIS — R109 Unspecified abdominal pain: Secondary | ICD-10-CM | POA: Diagnosis present

## 2019-12-08 MED ORDER — SODIUM CHLORIDE 0.9% FLUSH
3.0000 mL | Freq: Once | INTRAVENOUS | Status: AC
  Administered 2019-12-09: 3 mL via INTRAVENOUS

## 2019-12-08 NOTE — ED Triage Notes (Signed)
Pt c/o left sided abdominal pain that radiates to the right and nausea. Denies vomiting/diarrhea. Hx diverticulitis.

## 2019-12-09 ENCOUNTER — Emergency Department (HOSPITAL_COMMUNITY): Payer: 59

## 2019-12-09 LAB — URINALYSIS, ROUTINE W REFLEX MICROSCOPIC
Bacteria, UA: NONE SEEN
Bilirubin Urine: NEGATIVE
Glucose, UA: NEGATIVE mg/dL
Ketones, ur: NEGATIVE mg/dL
Leukocytes,Ua: NEGATIVE
Nitrite: NEGATIVE
Protein, ur: NEGATIVE mg/dL
Specific Gravity, Urine: 1.014 (ref 1.005–1.030)
pH: 5 (ref 5.0–8.0)

## 2019-12-09 LAB — COMPREHENSIVE METABOLIC PANEL
ALT: 20 U/L (ref 0–44)
AST: 22 U/L (ref 15–41)
Albumin: 4.1 g/dL (ref 3.5–5.0)
Alkaline Phosphatase: 68 U/L (ref 38–126)
Anion gap: 9 (ref 5–15)
BUN: 16 mg/dL (ref 6–20)
CO2: 24 mmol/L (ref 22–32)
Calcium: 9.3 mg/dL (ref 8.9–10.3)
Chloride: 105 mmol/L (ref 98–111)
Creatinine, Ser: 1.36 mg/dL — ABNORMAL HIGH (ref 0.61–1.24)
GFR calc Af Amer: >60 mL/min (ref >60)
GFR calc non Af Amer: >60 mL/min (ref >60)
Glucose, Bld: 108 mg/dL — ABNORMAL HIGH (ref 70–99)
Potassium: 3.5 mmol/L (ref 3.5–5.1)
Sodium: 138 mmol/L (ref 135–145)
Total Bilirubin: 0.6 mg/dL (ref 0.3–1.2)
Total Protein: 7.5 g/dL (ref 6.5–8.1)

## 2019-12-09 LAB — CBC
HCT: 45.4 % (ref 39.0–52.0)
Hemoglobin: 15 g/dL (ref 13.0–17.0)
MCH: 30.2 pg (ref 26.0–34.0)
MCHC: 33 g/dL (ref 30.0–36.0)
MCV: 91.5 fL (ref 80.0–100.0)
Platelets: 322 10*3/uL (ref 150–400)
RBC: 4.96 MIL/uL (ref 4.22–5.81)
RDW: 12.7 % (ref 11.5–15.5)
WBC: 15.3 10*3/uL — ABNORMAL HIGH (ref 4.0–10.5)
nRBC: 0 % (ref 0.0–0.2)

## 2019-12-09 LAB — LACTIC ACID, PLASMA: Lactic Acid, Venous: 1.6 mmol/L (ref 0.5–1.9)

## 2019-12-09 LAB — LIPASE, BLOOD: Lipase: 18 U/L (ref 11–51)

## 2019-12-09 MED ORDER — METRONIDAZOLE IN NACL 5-0.79 MG/ML-% IV SOLN
500.0000 mg | Freq: Once | INTRAVENOUS | Status: AC
  Administered 2019-12-09: 500 mg via INTRAVENOUS
  Filled 2019-12-09: qty 100

## 2019-12-09 MED ORDER — SODIUM CHLORIDE 0.9 % IV SOLN
1.0000 g | Freq: Once | INTRAVENOUS | Status: AC
  Administered 2019-12-09: 1 g via INTRAVENOUS
  Filled 2019-12-09: qty 10

## 2019-12-09 MED ORDER — HYDROCODONE-ACETAMINOPHEN 5-325 MG PO TABS: 1.0000 | ORAL_TABLET | ORAL | 0 refills | Status: DC | PRN

## 2019-12-09 MED ORDER — MORPHINE SULFATE (PF) 4 MG/ML IV SOLN
4.0000 mg | Freq: Once | INTRAVENOUS | Status: AC
  Administered 2019-12-09: 4 mg via INTRAVENOUS
  Filled 2019-12-09: qty 1

## 2019-12-09 MED ORDER — SODIUM CHLORIDE 0.9 % IV SOLN
1000.0000 mL | INTRAVENOUS | Status: DC
  Administered 2019-12-09: 1000 mL via INTRAVENOUS

## 2019-12-09 MED ORDER — AMOXICILLIN-POT CLAVULANATE 875-125 MG PO TABS
1.0000 | ORAL_TABLET | Freq: Two times a day (BID) | ORAL | 0 refills | Status: DC
Start: 2019-12-09 — End: 2020-06-12

## 2019-12-09 MED ORDER — OXYCODONE-ACETAMINOPHEN 5-325 MG PO TABS
2.0000 | ORAL_TABLET | Freq: Once | ORAL | Status: AC
  Administered 2019-12-09: 2 via ORAL
  Filled 2019-12-09: qty 2

## 2019-12-09 MED ORDER — IOHEXOL 300 MG/ML  SOLN
100.0000 mL | Freq: Once | INTRAMUSCULAR | Status: AC | PRN
  Administered 2019-12-09: 100 mL via INTRAVENOUS

## 2019-12-09 MED ORDER — ONDANSETRON HCL 4 MG/2ML IJ SOLN
4.0000 mg | Freq: Once | INTRAMUSCULAR | Status: AC
  Administered 2019-12-09: 4 mg via INTRAVENOUS
  Filled 2019-12-09: qty 2

## 2019-12-09 MED ORDER — SODIUM CHLORIDE 0.9 % IV BOLUS (SEPSIS)
1000.0000 mL | Freq: Once | INTRAVENOUS | Status: AC
  Administered 2019-12-09: 1000 mL via INTRAVENOUS

## 2019-12-09 MED ORDER — ONDANSETRON 4 MG PO TBDP
4.0000 mg | ORAL_TABLET | Freq: Three times a day (TID) | ORAL | 0 refills | Status: DC | PRN
Start: 2019-12-09 — End: 2020-06-12

## 2019-12-09 NOTE — ED Provider Notes (Signed)
[facility] EMERGENCY DEPARTMENT Provider Note   CSN: [id number] Arrival date & time: 12/08/19  2305     History Chief Complaint  Patient presents with  . Abdominal Pain    Benjamin [name] is a 49 y.o. male.  Patient with left-sided abdominal pain that onset acutely last night about 8:45 PM.  Reports the pain is constant and worse with palpation and movement.  Pain is similar to when he was diagnosed with diverticulitis about 2 months ago.  States he was admitted for 3 days at that time.  Pain tonight is similar.  Associate with nausea but no vomiting.  No fever.  No pain with urination or blood in the urine.  No diarrhea.  No chest pain or shortness of breath.  No previous abdominal surgeries.  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  No testicular pain.  The history is provided by the patient.  Abdominal Pain Associated symptoms: nausea   Associated symptoms: no chest pain, no cough, no diarrhea, no dysuria, no hematuria, no shortness of breath and no vomiting        Past Medical History:  Diagnosis Date  . Acute DVT of left tibial vein ([place]) 04/24/2017   Xarelto started (dx'd in emergency dept).  Dr. Marin Olp recommended full dose xarelto x 9mo, with an additional year of maintenance dose xarelto (as of 05/2017 initial consult note).  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  Pt Factor V leiden heterozygote.  Stable as of 09/2017 hem f/u.  . Adenomatous colon polyp 10/04/15  . ALLERGIC RHINITIS 07/11/2008  . Allergy    SEASONAL  . 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 Anxiety   . BACK PAIN, CHRONIC, INTERMITTENT 10/17/2008  . Barrett's esophagus determined by biopsy 04/12/14  . BENIGN PROSTATIC HYPERTROPHY, WITH URINARY OBSTRUCTION 07/21/2007  . BURSITIS, HIP 09/30/2009  . CAP (community acquired pneumonia) 06/2014  . Cataracts, bilateral 2016   Not visually significant  . Chronic headaches    Migraine syndrome: worsening  fall 2018, neuro eval 04/2017--topamax and maxalt started, MRI brain ordered (LP to be done if MRI neg--pt reporting fevers).  Pt referred for sleep study.  . Chronic renal insufficiency, stage II (mild)    GFR 60s-70s (suspect HTN and NSAID damage as etiology).  GFR dipped into 50s fall 2018 after lots of NSAID use for chronic daily HA's.  . Diverticulitis 2016   CT 01/2015  . Frequent headaches   . Gastric polyp 2006  . GERD without esophagitis   . Herpes labialis   . Hiatal hernia   . Hyperlipidemia   . HYPERTENSION 01/05/2007  . OSA (obstructive sleep apnea) 04/2017   Dr. Rexene Alberts eval 04/2017-plan for sleep study.  Marland Kitchen PSORIASIS 01/05/2007    Patient Active Problem List   Diagnosis Date Noted  . Diverticulitis 10/09/2019  . Acute DVT of left tibial vein ([place]) 04/27/2017  . Chronic migraine without aura, intractable, with status migrainosus 04/09/2017  . New daily persistent headache 03/29/2017  . Maxillary sinusitis 03/29/2017  . Urinary frequency 09/02/2015  . Rash and nonspecific skin eruption 09/02/2015  . LLQ abdominal pain 08/09/2015  . History of diverticulitis 08/09/2015  . LLQ pain 08/06/2015  . Nausea without vomiting 08/06/2015  . Diverticulitis of colon 07/02/2015  . BMI 26.0-26.9,adult 09/17/2014  . Health maintenance examination 06/28/2014  . Adjustment disorder with mixed anxiety and depressed mood 02/19/2014  . Chronic cluster headache, not intractable 02/19/2014  .  Herpes labialis 05/17/2012  . BURSITIS, HIP 09/30/2009  . BACK PAIN, CHRONIC, INTERMITTENT 10/17/2008  . ALLERGIC RHINITIS 07/11/2008  . Benign prostatic hyperplasia with urinary obstruction 07/21/2007  . Essential hypertension 01/05/2007  . PSORIASIS 01/05/2007    Past Surgical History:  Procedure Laterality Date  . COLONOSCOPY W/ POLYPECTOMY  10/04/15   62mm tubular adenoma + diverticulosis: recall 5 yrs (Dr. Fuller Plan)  . ESOPHAGOGASTRODUODENOSCOPY  04/12/14   Barrett's esophagus  . LIPOMA EXCISION      several, arm       Family History  Problem Relation Age of Onset  . CAD Father        around age 31  . Heart disease Father   . Hyperlipidemia Father   . Hypertension Father   . CAD Paternal Grandmother   . Diabetes Paternal Grandmother   . Hypertension Mother   . Hypertension Brother   . Hyperlipidemia Brother   . Lung cancer Maternal Grandmother   . Cancer Maternal Grandfather        type unknown  . Colon cancer Other        neg hx  . Prostate cancer Other        neg hx    Social History   Tobacco Use  . Smoking status: Never Smoker  . Smokeless tobacco: Never Used  Vaping Use  . Vaping Use: Never used  Substance Use Topics  . Alcohol use: Yes    Alcohol/week: 2.0 standard drinks    Types: 2 Cans of beer per week    Comment: occasionally  . Drug use: No    Home Medications Prior to Admission medications   Medication Sig Start Date End Date Taking? Authorizing Provider  acetaminophen (TYLENOL) 325 MG tablet Take 650 mg by mouth every 6 (six) hours as needed for mild pain or headache.    [provider]  buPROPion (WELLBUTRIN XL) 300 MG 24 hr tablet Take 1 tablet (300 mg total) by mouth daily. 01/25/18   [name], PA-C  cetirizine-pseudoephedrine (ZYRTEC-D) 5-120 MG tablet Take 1 tablet by mouth daily.    [provider]  fluticasone (FLONASE) 50 MCG/ACT nasal spray Place 2 sprays into both nostrils daily. Patient taking differently: Place 2 sprays into both nostrils daily as needed for allergies.  09/11/14   McGowen, Adrian Blackwater, MD  hyoscyamine (LEVSIN SL) 0.125 MG SL tablet Place 1 tablet (0.125 mg total) under the tongue every 4 (four) hours as needed for cramping. 10/04/15   Ladene Artist, MD  losartan (COZAAR) 25 MG tablet Take 2 tablets (50 mg total) by mouth daily. 10/12/19 11/11/19  Cherylann Ratel A, DO  rivaroxaban (XARELTO) 10 MG TABS tablet Take 1 tablet (10 mg total) by mouth daily. 10/12/19 11/11/19  Cherylann Ratel A, DO  rizatriptan  (MAXALT-MLT) 10 MG disintegrating tablet Take 1 tablet (10 mg total) as needed by mouth for migraine. May repeat in 2 hours if needed 04/14/17   Dohmeier, Asencion Partridge, MD  rosuvastatin (CRESTOR) 40 MG tablet Take 1 tablet (40 mg total) by mouth once a week. 12/27/17   [name], PA-C  tamsulosin (FLOMAX) 0.4 MG CAPS capsule Take 1 capsule (0.4 mg total) by mouth daily. 04/05/18   Tenna Delaine D, PA-C  tiZANidine (ZANAFLEX) 4 MG tablet Take 1 tablet (4 mg total) every 6 (six) hours as needed by mouth for muscle spasms. Or for headaches. 04/09/17   Melvenia Beam, MD  TROKENDI XR 25 MG CP24 TAKE 2 CAPSULES BY  MOUTH EVERY DAY Patient taking differently: Take 25 mg by mouth daily.  10/21/18   Forrest Moron, MD  valACYclovir (VALTREX) 500 MG tablet Take 1 tablet (500 mg total) by mouth daily. Patient taking differently: Take 500 mg by mouth daily as needed (fever blisters).  12/30/17   [name], PA-C  lisinopril-hydrochlorothiazide (PRINZIDE,ZESTORETIC) 20-25 MG per tablet Take 1 tablet by mouth daily. 07/24/11 08/21/11  Marletta Lor, MD    Allergies    Bismuth subsalicylate and Compazine [prochlorperazine edisylate]  Review of Systems   Review of Systems  Constitutional: Positive for activity change and appetite change.  HENT: Negative for congestion and rhinorrhea.   Respiratory: Negative for cough, chest tightness and shortness of breath.   Cardiovascular: Negative for chest pain.  Gastrointestinal: Positive for abdominal pain and nausea. Negative for diarrhea and vomiting.  Genitourinary: Negative for dysuria and hematuria.  Musculoskeletal: Negative for arthralgias and myalgias.  Skin: Negative for rash.  Neurological: Negative for dizziness, weakness and headaches.   all other systems are negative except as noted in the HPI and PMH.    Physical Exam Updated Vital Signs BP (!) 134/100 (BP Location: Right Arm)   Pulse 93   Temp 97.9 F (36.6 C) (Oral)   Resp 16    SpO2 100%   Physical Exam Vitals and nursing note reviewed.  Constitutional:      General: He is not in acute distress.    Appearance: He is well-developed.  HENT:     Head: Normocephalic and atraumatic.     Mouth/Throat:     Pharynx: No oropharyngeal exudate.  Eyes:     Conjunctiva/sclera: Conjunctivae normal.     Pupils: Pupils are equal, round, and reactive to light.  Neck:     Comments: No meningismus. Cardiovascular:     Rate and Rhythm: Normal rate and regular rhythm.     Heart sounds: Normal heart sounds. No murmur heard.   Pulmonary:     Effort: Pulmonary effort is normal. No respiratory distress.     Breath sounds: Normal breath sounds.  Abdominal:     Palpations: Abdomen is soft.     Tenderness: There is abdominal tenderness. There is guarding. There is no rebound.     Comments: TTP LLQ with guarding. No rebound  Musculoskeletal:        General: No tenderness. Normal range of motion.     Cervical back: Normal range of motion and neck supple.  Skin:    General: Skin is warm.     Capillary Refill: Capillary refill takes less than 2 seconds.  Neurological:     General: No focal deficit present.     Mental Status: He is alert and oriented to person, place, and time. Mental status is at baseline.     Cranial Nerves: No cranial nerve deficit.     Motor: No abnormal muscle tone.     Coordination: Coordination normal.     Comments: No ataxia on finger to nose bilaterally. No pronator drift. 5/5 strength throughout. CN 2-12 intact.Equal grip strength. Sensation intact.   Psychiatric:        Behavior: Behavior normal.     ED Results / Procedures / Treatments   Labs (all labs ordered are listed, but only abnormal results are displayed) Labs Reviewed  COMPREHENSIVE METABOLIC PANEL - Abnormal; Notable for the following components:      Result Value   Glucose, Bld 108 (*)    Creatinine, Ser 1.36 (*)  All other components within normal limits  CBC - Abnormal;  Notable for the following components:   WBC 15.3 (*)    All other components within normal limits  URINALYSIS, ROUTINE W REFLEX MICROSCOPIC - Abnormal; Notable for the following components:   APPearance HAZY (*)    Hgb urine dipstick SMALL (*)    All other components within normal limits  LIPASE, BLOOD  LACTIC ACID, PLASMA    EKG None  Radiology CT ABDOMEN PELVIS W CONTRAST  Result Date: 12/09/2019 CLINICAL DATA:  LEFT-sided pain with nausea, onset last night. History of diverticulitis. EXAM: CT ABDOMEN AND PELVIS WITH CONTRAST TECHNIQUE: Multidetector CT imaging of the abdomen and pelvis was performed using the standard protocol following bolus administration of intravenous contrast. CONTRAST:  164mL OMNIPAQUE IOHEXOL 300 MG/ML  SOLN COMPARISON:  CT abdomen dated 10/09/2019 FINDINGS: Lower chest: No acute abnormality. Hepatobiliary: No focal liver abnormality is seen. No gallstones, gallbladder wall thickening, or biliary dilatation. Pancreas: Unremarkable. No pancreatic ductal dilatation or surrounding inflammatory changes. Spleen: Normal in size without focal abnormality. Adrenals/Urinary Tract: Adrenal glands appear normal. Kidneys are unremarkable without mass, stone or hydronephrosis. Bladder appears normal. Stomach/Bowel: No dilated large or small bowel loops. Thickening of the walls of a focal segment of the mid descending colon, with surrounding fluid/inflammation, diverticulitis versus localized colitis. Diverticulosis of the sigmoid colon without evidence of acute diverticulitis. Appendix is unremarkable. Stomach is unremarkable, partially decompressed. Vascular/Lymphatic: No significant vascular findings are present. No enlarged abdominal or pelvic lymph nodes. Reproductive: Prostate is unremarkable. Other: No abscess collection seen.  No free intraperitoneal air. Musculoskeletal: No acute or suspicious osseous finding. IMPRESSION: 1. Thickening of the walls of a focal segment of the mid  descending colon, with surrounding fluid/inflammation, most likely acute uncomplicated diverticulitis, less likely localized colitis of infectious, inflammatory or ischemic nature. No abscess collection seen. No free intraperitoneal air. No associated bowel obstruction. 2. Additional sigmoid colon diverticulosis. Electronically Signed   By: Franki Cabot M.D.   On: 12/09/2019 05:49    Procedures Procedures (including critical care time)  Medications Ordered in ED Medications  sodium chloride 0.9 % bolus 1,000 mL (1,000 mLs Intravenous New Bag/Given 12/09/19 0503)    Followed by  0.9 %  sodium chloride infusion (1,000 mLs Intravenous New Bag/Given 12/09/19 0504)  sodium chloride flush (NS) 0.9 % injection 3 mL (3 mLs Intravenous Given 12/09/19 0504)  ondansetron (ZOFRAN) injection 4 mg (4 mg Intravenous Given 12/09/19 0505)  morphine 4 MG/ML injection 4 mg (4 mg Intravenous Given 12/09/19 0505)    ED Course  I have reviewed the triage vital signs and the nursing notes.  Pertinent labs & imaging results that were available during my care of the patient were reviewed by me and considered in my medical decision making (see chart for details).    MDM Rules/Calculators/A&P                         LLQ pain similar to previous episodes of diverticulitis. Vitals stable.   Labs show leukocytosis.  Patient was given IV fluids and pain and nausea control.  The CT scan will be obtained given his previous history of diverticulitis with phlegmon. Lactate is normal.   CT today shows uncomplicated diverticulitis in the left lower quadrant.  No abscess.  No perforation or free air.  On reassessment, patient feels somewhat improved with still having pain and nausea.  Will attempt p.o. challenge and pain control.  He like  to go home if able. IV antibiotics given.  Discussed with patient he will need referral to PCP as well as GI and general surgery given recurrent episodes of diverticulitis. Anticipate  discharge home if his pain and nausea are under control and he is able to tolerate p.o. Dr. Gilford Raid to assume care at shift change.  Final Clinical Impression(s) / ED Diagnoses Final diagnoses:  Diverticulitis    Rx / DC Orders ED Discharge Orders    None       Perel Hauschild, Annie Main, MD 12/09/19 [phone]

## 2019-12-09 NOTE — Discharge Instructions (Addendum)
Take the antibiotics, pain and nausea medications as prescribed. Follow-up with your doctor as well as the gastroenterologist. You are given a referral to the surgeon as well to evaluate you for recurrent episodes of diverticulitis. Return to the ED with worsening pain, fever, vomiting, other concerns.

## 2019-12-09 NOTE — ED Provider Notes (Signed)
Pt signed out by Dr. Wyvonnia Dusky pending symptomatic improvement.  Pt is feeling much better now and feels ready to go home.  Pt is stable for d/c.  Return if worse.   Isla Pence, MD 12/09/19 [phone]

## 2020-03-02 ENCOUNTER — Other Ambulatory Visit: Payer: Self-pay | Admitting: Primary Care

## 2020-03-02 DIAGNOSIS — U071 COVID-19: Secondary | ICD-10-CM

## 2020-03-02 NOTE — Progress Notes (Signed)
I connected by phone with Benjamin [name] on 03/02/2020 at 1:10 PM to discuss the potential use of a new treatment for mild to moderate COVID-19 viral infection in non-hospitalized patients.  This patient is a 49 y.o. male that meets the FDA criteria for Emergency Use Authorization of COVID monoclonal antibody casirivimab/imdevimab or bamlanivimab/eteseviamb.  Has a (+) direct SARS-CoV-2 viral test result  Has mild or moderate COVID-19   Is NOT hospitalized due to COVID-19  Is within 10 days of symptom onset  Has at least one of the high risk factor(s) for progression to severe COVID-19 and/or hospitalization as defined in EUA.  Specific high risk criteria : Chronic Kidney Disease (CKD) and Cardiovascular disease or hypertension   I have spoken and communicated the following to the patient or parent/caregiver regarding COVID monoclonal antibody treatment:  1. FDA has authorized the emergency use for the treatment of mild to moderate COVID-19 in adults and pediatric patients with positive results of direct SARS-CoV-2 viral testing who are 55 years of age and older weighing at least 40 kg, and who are at high risk for progressing to severe COVID-19 and/or hospitalization.  2. The significant known and potential risks and benefits of COVID monoclonal antibody, and the extent to which such potential risks and benefits are unknown.  3. Information on available alternative treatments and the risks and benefits of those alternatives, including clinical trials.  4. Patients treated with COVID monoclonal antibody should continue to self-isolate and use infection control measures (e.g., wear mask, isolate, social distance, avoid sharing personal items, clean and disinfect "high touch" surfaces, and frequent handwashing) according to CDC guidelines.   5. The patient or parent/caregiver has the option to accept or refuse COVID monoclonal antibody treatment.  After reviewing this information with  the patient, the patient has agreed to receive one of the available covid 19 monoclonal antibodies and will be provided an appropriate fact sheet prior to infusion. Pleas Koch, NP 03/02/2020 1:10 PM

## 2020-03-03 ENCOUNTER — Ambulatory Visit (HOSPITAL_COMMUNITY): Payer: 59

## 2020-03-03 ENCOUNTER — Ambulatory Visit (HOSPITAL_COMMUNITY)
Admission: RE | Admit: 2020-03-03 | Discharge: 2020-03-03 | Disposition: A | Discharge status: Home / Self Care | Payer: 59 | Source: Ambulatory Visit | Attending: Pulmonary Disease | Admitting: Pulmonary Disease

## 2020-03-03 ENCOUNTER — Other Ambulatory Visit (HOSPITAL_COMMUNITY): Payer: Self-pay

## 2020-03-03 DIAGNOSIS — U071 COVID-19: Secondary | ICD-10-CM | POA: Insufficient documentation

## 2020-03-03 MED ORDER — DIPHENHYDRAMINE HCL 50 MG/ML IJ SOLN: 50.0000 mg | Freq: Once | INTRAMUSCULAR | Status: DC | PRN

## 2020-03-03 MED ORDER — FAMOTIDINE IN NACL 20-0.9 MG/50ML-% IV SOLN: 20.0000 mg | Freq: Once | INTRAVENOUS | Status: DC | PRN

## 2020-03-03 MED ORDER — ALBUTEROL SULFATE HFA 108 (90 BASE) MCG/ACT IN AERS: 2.0000 | INHALATION_SPRAY | Freq: Once | RESPIRATORY_TRACT | Status: DC | PRN

## 2020-03-03 MED ORDER — SODIUM CHLORIDE 0.9 % IV SOLN
1200.0000 mg | Freq: Once | INTRAVENOUS | Status: AC
  Administered 2020-03-03: 1200 mg via INTRAVENOUS

## 2020-03-03 MED ORDER — SODIUM CHLORIDE 0.9 % IV SOLN: INTRAVENOUS | Status: DC | PRN

## 2020-03-03 MED ORDER — METHYLPREDNISOLONE SODIUM SUCC 125 MG IJ SOLR: 125.0000 mg | Freq: Once | INTRAMUSCULAR | Status: DC | PRN

## 2020-03-03 MED ORDER — EPINEPHRINE 0.3 MG/0.3ML IJ SOAJ: 0.3000 mg | Freq: Once | INTRAMUSCULAR | Status: DC | PRN

## 2020-03-03 NOTE — Discharge Instructions (Signed)

## 2020-03-03 NOTE — Progress Notes (Signed)
  Diagnosis: COVID-19  Physician: Dr. Joya Gaskins  Procedure: [facility] Med: casirivimab\imdevimab infusion - Provided patient with casirivimab\imdevimab fact sheet for patients, parents and caregivers prior to infusion.  Complications: No immediate complications noted.  Discharge: Discharged home   Benjamin [name] 03/03/2020

## 2020-06-11 DIAGNOSIS — R002 Palpitations: Secondary | ICD-10-CM | POA: Insufficient documentation

## 2020-06-11 NOTE — Progress Notes (Signed)
Patient referred by [name], PA-C for palpitations  Subjective:   Benjamin [name], male    DOB: 20-Sep-1970, 50 y.o.   MRN: [id number]   Chief Complaint  Patient presents with  . Palpitations  . New Patient (Initial Visit)     HPI  50 y.o. 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  Patient is in a calm manner, travels a lot for work.  His physical activity includes walking around the neighborhood.  Colonoscopy reviewed, he has noticed complaints of palpitations lasting for few minutes, better with rest.  He denies any chest pain, does have occasional shortness of breath, denies any presyncope or syncope.  His father had A. fib and pacemaker placement in 62s.  Patient drinks alcohol occasionally.  Drinks 2, 12 ounce cups of coffee every day.  Past Medical History:  Diagnosis Date  . Acute DVT of left tibial vein ([place]) 04/24/2017   Xarelto started (dx'd in emergency dept).  Dr. Marin Olp recommended full dose xarelto x 70mo with an additional year of maintenance dose xarelto (as of 05/2017 initial consult note).  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  Pt Factor V leiden heterozygote.  Stable as of 09/2017 hem f/u.  . Adenomatous colon polyp 10/04/15  . ALLERGIC RHINITIS 07/11/2008  . Allergy    SEASONAL  . 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 Anxiety   . BACK PAIN, CHRONIC, INTERMITTENT 10/17/2008  . Barrett's esophagus determined by biopsy 04/12/14  . BENIGN PROSTATIC HYPERTROPHY, WITH URINARY OBSTRUCTION 07/21/2007  . BURSITIS, HIP 09/30/2009  . CAP (community acquired pneumonia) 06/2014  . Cataracts, bilateral 2016   Not visually significant  . Chronic headaches    Migraine syndrome: worsening fall 2018, neuro eval 04/2017--topamax and maxalt started, MRI brain ordered (LP to be done if MRI neg--pt reporting fevers).  Pt referred for  sleep study.  . Chronic renal insufficiency, stage II (mild)    GFR 60s-70s (suspect HTN and NSAID damage as etiology).  GFR dipped into 50s fall 2018 after lots of NSAID use for chronic daily HA's.  . Diverticulitis 2016   CT 01/2015  . Frequent headaches   . Gastric polyp 2006  . GERD without esophagitis   . Herpes labialis   . Hiatal hernia   . Hyperlipidemia   . HYPERTENSION 01/05/2007  . OSA (obstructive sleep apnea) 04/2017   Dr. ARexene Albertseval 04/2017-plan for sleep study.  .Marland KitchenPSORIASIS 01/05/2007     Past Surgical History:  Procedure Laterality Date  . COLONOSCOPY W/ POLYPECTOMY  10/04/15   715mtubular adenoma + diverticulosis: recall 5 yrs (Dr. StFuller Plan . ESOPHAGOGASTRODUODENOSCOPY  04/12/14   Barrett's esophagus  . LIPOMA EXCISION     several, arm     Social History   Tobacco Use  Smoking Status Never Smoker  Smokeless Tobacco Never Used    Social History   Substance and Sexual Activity  Alcohol Use Yes  . Alcohol/week: 2.0 standard drinks  . Types: 2 Cans of beer per week   Comment: occasionally     Family History  Problem Relation Age of Onset  . CAD Father        around age 51. Heart disease Father   . Hyperlipidemia Father   . Hypertension Father   . CAD Paternal Grandmother   . Diabetes Paternal Grandmother   .  Hypertension Mother   . Hypertension Brother   . Hyperlipidemia Brother   . Lung cancer Maternal Grandmother   . Cancer Maternal Grandfather        type unknown  . Colon cancer Other        neg hx  . Prostate cancer Other        neg hx     Current Outpatient Medications on File Prior to Visit  Medication Sig Dispense Refill  . acetaminophen (TYLENOL) 325 MG tablet Take 650 mg by mouth every 6 (six) hours as needed for mild pain or headache.    Marland Kitchen amLODipine (NORVASC) 2.5 MG tablet Take 2.5 mg by mouth daily.    Marland Kitchen buPROPion (WELLBUTRIN XL) 300 MG 24 hr tablet Take 1 tablet (300 mg total) by mouth daily. 90 tablet 3  .  cetirizine-pseudoephedrine (ZYRTEC-D) 5-120 MG tablet Take 1 tablet by mouth daily as needed for allergies.     Marland Kitchen esomeprazole (NEXIUM) 20 MG capsule Take 20 mg by mouth daily at 12 noon.    . fluticasone (FLONASE) 50 MCG/ACT nasal spray Place 2 sprays into both nostrils daily. (Patient taking differently: Place 2 sprays into both nostrils daily as needed for allergies.) 16 g 6  . hyoscyamine (LEVSIN SL) 0.125 MG SL tablet Place 1 tablet (0.125 mg total) under the tongue every 4 (four) hours as needed for cramping. 120 tablet 11  . losartan (COZAAR) 25 MG tablet Take 2 tablets (50 mg total) by mouth daily. (Patient taking differently: Take 25 mg by mouth daily.) 60 tablet 0  . rivaroxaban (XARELTO) 10 MG TABS tablet Take 1 tablet (10 mg total) by mouth daily. 30 tablet 0  . rizatriptan (MAXALT-MLT) 10 MG disintegrating tablet Take 1 tablet (10 mg total) as needed by mouth for migraine. May repeat in 2 hours if needed 10 tablet 0  . rosuvastatin (CRESTOR) 40 MG tablet Take 1 tablet (40 mg total) by mouth once a week. (Patient taking differently: Take 40 mg by mouth every Friday.) 4 tablet 12  . tamsulosin (FLOMAX) 0.4 MG CAPS capsule Take 1 capsule (0.4 mg total) by mouth daily. 90 capsule 1  . tiZANidine (ZANAFLEX) 4 MG tablet Take 1 tablet (4 mg total) every 6 (six) hours as needed by mouth for muscle spasms. Or for headaches. 60 tablet 6  . TROKENDI XR 25 MG CP24 TAKE 2 CAPSULES BY MOUTH EVERY DAY (Patient taking differently: Take 25 mg by mouth daily.) 60 capsule 0  . valACYclovir (VALTREX) 500 MG tablet Take 1 tablet (500 mg total) by mouth daily. (Patient taking differently: Take 500 mg by mouth daily as needed (fever blisters).) 30 tablet 12  . [DISCONTINUED] lisinopril-hydrochlorothiazide (PRINZIDE,ZESTORETIC) 20-25 MG per tablet Take 1 tablet by mouth daily. 90 tablet 3   Current Facility-Administered Medications on File Prior to Visit  Medication Dose Route Frequency Provider Last Rate Last  Admin  . gadopentetate dimeglumine (MAGNEVIST) injection 18 mL  18 mL Intravenous Once PRN Melvenia Beam, MD        Cardiovascular and other pertinent studies:   EKG 06/12/2020: Sinus rhythm 86 bpm  Right bundle branch block  Left atrial enlargement   Recent labs: 05/09/2020: Glucose 91, BUN/Cr 11/1.5. EGFR 54. Na/K 144/3.2. Rest of the CMP normal H/H 15.6/45.2. MCV 88. Platelets 323 HbA1C 5.1% Chol 190, TG 215, HDL 41, LDL 106    Review of Systems  Cardiovascular: Positive for palpitations. Negative for chest pain, dyspnea on exertion, leg swelling and syncope.  Vitals:   06/12/20 0852 06/12/20 0900  BP: (!) 145/110 (!) 142/104  Pulse: 94 87  SpO2: 97% 97%     Body mass index is 28.94 kg/m. Filed Weights   06/12/20 0852  Weight: 196 lb (88.9 kg)     Objective:   Physical Exam Vitals and nursing note reviewed.  Constitutional:      General: He is not in acute distress. Neck:     Vascular: No JVD.  Cardiovascular:     Rate and Rhythm: Normal rate and regular rhythm.     Heart sounds: Normal heart sounds. No murmur heard.   Pulmonary:     Effort: Pulmonary effort is normal.     Breath sounds: Normal breath sounds. No wheezing or rales.  Neurological:     Deep Tendon Reflexes: Abnormal reflex:          Assessment & Recommendations:   50 y.o. 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  Palpitations: Resting EKG with sinus rhythm and right bundle branch block.  Episodes concerning for tachyarrhythmias.  Recommend 2-week cardiac monitor and echocardiogram.  Encouraged reduction of caffeine intake.  Hypertension: Continue current antihypertensive therapy.  Encouraged him to keep a log of blood pressure.  Further recommendations after above testing.   Thank you for referring the patient to [place]. Please feel free to contact with any  questions.   [name], MD Pager: [phone] Office: [phone]

## 2020-06-12 ENCOUNTER — Inpatient Hospital Stay: Payer: 59

## 2020-06-12 ENCOUNTER — Ambulatory Visit: Payer: 59 | Admitting: Cardiology

## 2020-06-12 ENCOUNTER — Other Ambulatory Visit: Payer: Self-pay

## 2020-06-12 ENCOUNTER — Encounter: Payer: Self-pay | Admitting: Cardiology

## 2020-06-12 VITALS — BP 142/104 | HR 87 | Ht 69.0 in | Wt 196.0 lb

## 2020-06-12 DIAGNOSIS — I4510 Unspecified right bundle-branch block: Secondary | ICD-10-CM

## 2020-06-12 DIAGNOSIS — R002 Palpitations: Secondary | ICD-10-CM

## 2020-06-24 ENCOUNTER — Emergency Department (HOSPITAL_COMMUNITY)
Admission: EM | Admit: 2020-06-24 | Discharge: 2020-06-24 | Disposition: A | Discharge status: Home / Self Care | Payer: 59 | Attending: Emergency Medicine | Admitting: Emergency Medicine

## 2020-06-24 ENCOUNTER — Emergency Department (HOSPITAL_COMMUNITY): Payer: 59

## 2020-06-24 ENCOUNTER — Other Ambulatory Visit: Payer: Self-pay

## 2020-06-24 ENCOUNTER — Encounter (HOSPITAL_COMMUNITY): Payer: Self-pay

## 2020-06-24 DIAGNOSIS — Z79899 Other long term (current) drug therapy: Secondary | ICD-10-CM | POA: Insufficient documentation

## 2020-06-24 DIAGNOSIS — Z7901 Long term (current) use of anticoagulants: Secondary | ICD-10-CM | POA: Insufficient documentation

## 2020-06-24 DIAGNOSIS — K219 Gastro-esophageal reflux disease without esophagitis: Secondary | ICD-10-CM | POA: Insufficient documentation

## 2020-06-24 DIAGNOSIS — K5792 Diverticulitis of intestine, part unspecified, without perforation or abscess without bleeding: Secondary | ICD-10-CM

## 2020-06-24 DIAGNOSIS — K5732 Diverticulitis of large intestine without perforation or abscess without bleeding: Secondary | ICD-10-CM | POA: Diagnosis not present

## 2020-06-24 DIAGNOSIS — N182 Chronic kidney disease, stage 2 (mild): Secondary | ICD-10-CM | POA: Diagnosis not present

## 2020-06-24 DIAGNOSIS — I129 Hypertensive chronic kidney disease with stage 1 through stage 4 chronic kidney disease, or unspecified chronic kidney disease: Secondary | ICD-10-CM | POA: Insufficient documentation

## 2020-06-24 DIAGNOSIS — R109 Unspecified abdominal pain: Secondary | ICD-10-CM | POA: Diagnosis present

## 2020-06-24 LAB — URINALYSIS, ROUTINE W REFLEX MICROSCOPIC
Bacteria, UA: NONE SEEN
Bilirubin Urine: NEGATIVE
Glucose, UA: NEGATIVE mg/dL
Ketones, ur: NEGATIVE mg/dL
Leukocytes,Ua: NEGATIVE
Nitrite: NEGATIVE
Protein, ur: NEGATIVE mg/dL
Specific Gravity, Urine: 1.026 (ref 1.005–1.030)
pH: 5 (ref 5.0–8.0)

## 2020-06-24 LAB — CBC
HCT: 46 % (ref 39.0–52.0)
Hemoglobin: 15.5 g/dL (ref 13.0–17.0)
MCH: 30.9 pg (ref 26.0–34.0)
MCHC: 33.7 g/dL (ref 30.0–36.0)
MCV: 91.6 fL (ref 80.0–100.0)
Platelets: 301 10*3/uL (ref 150–400)
RBC: 5.02 MIL/uL (ref 4.22–5.81)
RDW: 12.1 % (ref 11.5–15.5)
WBC: 10.2 10*3/uL (ref 4.0–10.5)
nRBC: 0 % (ref 0.0–0.2)

## 2020-06-24 LAB — COMPREHENSIVE METABOLIC PANEL
ALT: 22 U/L (ref 0–44)
AST: 19 U/L (ref 15–41)
Albumin: 4.2 g/dL (ref 3.5–5.0)
Alkaline Phosphatase: 59 U/L (ref 38–126)
Anion gap: 12 (ref 5–15)
BUN: 18 mg/dL (ref 6–20)
CO2: 23 mmol/L (ref 22–32)
Calcium: 9.5 mg/dL (ref 8.9–10.3)
Chloride: 103 mmol/L (ref 98–111)
Creatinine, Ser: 1.22 mg/dL (ref 0.61–1.24)
GFR, Estimated: >60 mL/min (ref >60)
Glucose, Bld: 100 mg/dL — ABNORMAL HIGH (ref 70–99)
Potassium: 3.6 mmol/L (ref 3.5–5.1)
Sodium: 138 mmol/L (ref 135–145)
Total Bilirubin: 1 mg/dL (ref 0.3–1.2)
Total Protein: 7.9 g/dL (ref 6.5–8.1)

## 2020-06-24 LAB — LIPASE, BLOOD: Lipase: 19 U/L (ref 11–51)

## 2020-06-24 MED ORDER — HYDROMORPHONE HCL 1 MG/ML IJ SOLN
0.5000 mg | Freq: Once | INTRAMUSCULAR | Status: AC
  Administered 2020-06-24: 0.5 mg via INTRAVENOUS
  Filled 2020-06-24: qty 1

## 2020-06-24 MED ORDER — SODIUM CHLORIDE 0.9 % IV SOLN
2.0000 g | Freq: Once | INTRAVENOUS | Status: AC
  Administered 2020-06-24: 2 g via INTRAVENOUS
  Filled 2020-06-24: qty 20

## 2020-06-24 MED ORDER — ONDANSETRON HCL 4 MG PO TABS: 4.0000 mg | ORAL_TABLET | Freq: Three times a day (TID) | ORAL | 0 refills | Status: DC | PRN

## 2020-06-24 MED ORDER — HYDROMORPHONE HCL 1 MG/ML IJ SOLN
1.0000 mg | Freq: Once | INTRAMUSCULAR | Status: AC
  Administered 2020-06-24: 1 mg via INTRAVENOUS
  Filled 2020-06-24: qty 1

## 2020-06-24 MED ORDER — CIPROFLOXACIN HCL 500 MG PO TABS: 500.0000 mg | ORAL_TABLET | Freq: Two times a day (BID) | ORAL | 0 refills | Status: DC

## 2020-06-24 MED ORDER — CIPROFLOXACIN HCL 500 MG PO TABS: 500.0000 mg | ORAL_TABLET | Freq: Two times a day (BID) | ORAL | 0 refills | Status: AC

## 2020-06-24 MED ORDER — IOHEXOL 300 MG/ML  SOLN
100.0000 mL | Freq: Once | INTRAMUSCULAR | Status: AC | PRN
  Administered 2020-06-24: 100 mL via INTRAVENOUS

## 2020-06-24 MED ORDER — SODIUM CHLORIDE 0.9 % IV BOLUS
1000.0000 mL | Freq: Once | INTRAVENOUS | Status: AC
  Administered 2020-06-24: 1000 mL via INTRAVENOUS

## 2020-06-24 MED ORDER — OXYCODONE-ACETAMINOPHEN 5-325 MG PO TABS
1.0000 | ORAL_TABLET | Freq: Four times a day (QID) | ORAL | 0 refills | Status: AC | PRN
Start: 2020-06-24 — End: 2020-06-26

## 2020-06-24 MED ORDER — ONDANSETRON HCL 4 MG/2ML IJ SOLN
4.0000 mg | Freq: Once | INTRAMUSCULAR | Status: AC
  Administered 2020-06-24: 4 mg via INTRAVENOUS
  Filled 2020-06-24: qty 2

## 2020-06-24 MED ORDER — METRONIDAZOLE IN NACL 5-0.79 MG/ML-% IV SOLN
500.0000 mg | Freq: Once | INTRAVENOUS | Status: AC
  Administered 2020-06-24: 500 mg via INTRAVENOUS
  Filled 2020-06-24: qty 100

## 2020-06-24 MED ORDER — METRONIDAZOLE 500 MG PO TABS: 500.0000 mg | ORAL_TABLET | Freq: Three times a day (TID) | ORAL | 0 refills | Status: AC

## 2020-06-24 NOTE — ED Triage Notes (Signed)
Patient c/o abdominal pain x 3 days and emesis x 1 episode. Patient reports a history of diverticulitis.

## 2020-06-24 NOTE — ED Provider Notes (Signed)
[facility] DEPT Provider Note   CSN: [id number] Arrival date & time: 06/24/20  1050    History Chief Complaint  Patient presents with  . Abdominal Pain  . Emesis    Benjamin [name] is a 50 y.o. male history of recurrent diverticulitis, DVT who presents for evaluation of abd pain.  Patient pain began on Friday.  Located to left lower quadrant.  Occasional radiating to his periumbilical region.  Took a dose of tramadol which did not help, subsequently started taking hydrocodone which has helped his pain.  He does still have some mild pain.  Given he does have history of diverticulitis his PCP had prescribed him Augmentin.  He started this on Friday.  Took Friday, Saturday, Sunday, did not take dose today.  Had NBNB emesis on Friday and Saturday however none on Sunday, Monday.  Initially had diarrhea however now feels like he is constipated.  His last bowel movement was yesterday.  No melena or bright blood per rectum.  Had a temperature of 100.1 on Friday, no fever since.  Has had decreased appetite however he is able to tolerate p.o. intake he is able to keep this down.  Rates his current pain a 10/10.  He denies any dysuria, hematuria, testicular pain.  No recent Covid exposures.  Denies headache, lightheadedness, dizziness, weakness, chest pain, shortness of breath, cough, weakness.  No recent trauma or injury.   Denies additional aggravating or alleviating factors.  History obtained from patient past medical records.  No interpreter used.  HPI     Past Medical History:  Diagnosis Date  . Acute DVT of left tibial vein ([place]) 04/24/2017   Xarelto started (dx'd in emergency dept).  Dr. Marin Olp recommended full dose xarelto x 69mo, with an additional year of maintenance dose xarelto (as of 05/2017 initial consult note).  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  Pt Factor V leiden heterozygote.  Stable as of 09/2017 hem f/u.   . Adenomatous colon polyp 10/04/15  . ALLERGIC RHINITIS 07/11/2008  . Allergy    SEASONAL  . 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 Anxiety   . BACK PAIN, CHRONIC, INTERMITTENT 10/17/2008  . Barrett's esophagus determined by biopsy 04/12/14  . BENIGN PROSTATIC HYPERTROPHY, WITH URINARY OBSTRUCTION 07/21/2007  . BURSITIS, HIP 09/30/2009  . CAP (community acquired pneumonia) 06/2014  . Cataracts, bilateral 2016   Not visually significant  . Chronic headaches    Migraine syndrome: worsening fall 2018, neuro eval 04/2017--topamax and maxalt started, MRI brain ordered (LP to be done if MRI neg--pt reporting fevers).  Pt referred for sleep study.  . Chronic renal insufficiency, stage II (mild)    GFR 60s-70s (suspect HTN and NSAID damage as etiology).  GFR dipped into 50s fall 2018 after lots of NSAID use for chronic daily HA's.  . Diverticulitis 2016   CT 01/2015  . Frequent headaches   . Gastric polyp 2006  . GERD without esophagitis   . Herpes labialis   . Hiatal hernia   . Hyperlipidemia   . HYPERTENSION 01/05/2007  . OSA (obstructive sleep apnea) 04/2017   Dr. Rexene Alberts eval 04/2017-plan for sleep study.  Marland Kitchen PSORIASIS 01/05/2007    Patient Active Problem List   Diagnosis Date Noted  . Palpitations 06/11/2020  . Diverticulitis 10/09/2019  . Acute DVT of left tibial vein ([place]) 04/27/2017  . Chronic migraine without aura, intractable, with status migrainosus 04/09/2017  . New daily persistent  headache 03/29/2017  . Maxillary sinusitis 03/29/2017  . Urinary frequency 09/02/2015  . Rash and nonspecific skin eruption 09/02/2015  . LLQ abdominal pain 08/09/2015  . History of diverticulitis 08/09/2015  . LLQ pain 08/06/2015  . Nausea without vomiting 08/06/2015  . Diverticulitis of colon 07/02/2015  . BMI 26.0-26.9,adult 09/17/2014  . Health maintenance examination 06/28/2014  . Adjustment disorder with mixed anxiety and depressed mood 02/19/2014  . Chronic cluster headache, not intractable  02/19/2014  . Herpes labialis 05/17/2012  . BURSITIS, HIP 09/30/2009  . BACK PAIN, CHRONIC, INTERMITTENT 10/17/2008  . ALLERGIC RHINITIS 07/11/2008  . Benign prostatic hyperplasia with urinary obstruction 07/21/2007  . Essential hypertension 01/05/2007  . PSORIASIS 01/05/2007    Past Surgical History:  Procedure Laterality Date  . COLONOSCOPY W/ POLYPECTOMY  10/04/15   46mm tubular adenoma + diverticulosis: recall 5 yrs (Dr. Fuller Plan)  . ESOPHAGOGASTRODUODENOSCOPY  04/12/14   Barrett's esophagus  . LIPOMA EXCISION     several, arm       Family History  Problem Relation Age of Onset  . CAD Father        around age 3  . Heart disease Father   . Hyperlipidemia Father   . Hypertension Father   . CAD Paternal Grandmother   . Diabetes Paternal Grandmother   . Hypertension Mother   . Hypertension Brother   . Hyperlipidemia Brother   . Lung cancer Maternal Grandmother   . Cancer Maternal Grandfather        type unknown  . Colon cancer Other        neg hx  . Prostate cancer Other        neg hx    Social History   Tobacco Use  . Smoking status: Never Smoker  . Smokeless tobacco: Never Used  Vaping Use  . Vaping Use: Never used  Substance Use Topics  . Alcohol use: Yes    Alcohol/week: 2.0 standard drinks    Types: 2 Cans of beer per week    Comment: occasionally  . Drug use: No    Home Medications Prior to Admission medications   Medication Sig Start Date End Date Taking? Authorizing Provider  acetaminophen (TYLENOL) 325 MG tablet Take 650 mg by mouth every 6 (six) hours as needed for mild pain or headache.   Yes [provider]  amLODipine (NORVASC) 2.5 MG tablet Take 2.5 mg by mouth daily. 05/21/20  Yes [provider]  buPROPion (WELLBUTRIN XL) 300 MG 24 hr tablet Take 1 tablet (300 mg total) by mouth daily. 01/25/18  Yes [name], PA-C  esomeprazole (NEXIUM) 20 MG capsule Take 20 mg by mouth daily at 12 noon.   Yes [provider]   losartan (COZAAR) 25 MG tablet Take 2 tablets (50 mg total) by mouth daily. Patient taking differently: Take 25 mg by mouth daily. 10/12/19 12/08/28 Yes Kyle, Tyrone A, DO  Multiple Vitamin (MULTIVITAMIN WITH MINERALS) TABS tablet Take 1 tablet by mouth daily.   Yes [provider]  rivaroxaban (XARELTO) 10 MG TABS tablet Take 1 tablet (10 mg total) by mouth daily. 10/12/19 12/08/28 Yes Kyle, Tyrone A, DO  rosuvastatin (CRESTOR) 40 MG tablet Take 1 tablet (40 mg total) by mouth once a week. Patient taking differently: Take 40 mg by mouth every Friday. 12/27/17  Yes [name], PA-C  tamsulosin (FLOMAX) 0.4 MG CAPS capsule Take 1 capsule (0.4 mg total) by mouth daily. 04/05/18  Yes Tenna Delaine D, PA-C  valACYclovir (  VALTREX) 500 MG tablet Take 1 tablet (500 mg total) by mouth daily. 12/30/17  Yes Philis Fendt L, PA-C  TROKENDI XR 25 MG CP24 TAKE 2 CAPSULES BY MOUTH EVERY DAY Patient not taking: Reported on 06/24/2020 10/21/18   Forrest Moron, MD  lisinopril-hydrochlorothiazide (PRINZIDE,ZESTORETIC) 20-25 MG per tablet Take 1 tablet by mouth daily. 07/24/11 08/21/11  Marletta Lor, MD    Allergies    Bismuth subsalicylate and Compazine [prochlorperazine edisylate]  Review of Systems   Review of Systems  Constitutional: Negative.   HENT: Negative.   Respiratory: Negative.   Cardiovascular: Negative.   Gastrointestinal: Positive for abdominal pain, constipation, nausea and vomiting. Negative for abdominal distention, anal bleeding, blood in stool, diarrhea and rectal pain.  Genitourinary: Negative.   Musculoskeletal: Negative.   Skin: Negative.   Neurological: Negative.   All other systems reviewed and are negative.   Physical Exam Updated Vital Signs BP (!) 125/102   Pulse 88   Temp 98.5 F (36.9 C) (Oral)   Resp 11   Ht 5\' 9"  (1.753 m)   Wt 88.5 kg   SpO2 99%   BMI 28.80 kg/m   Physical Exam Vitals and nursing note reviewed.  Constitutional:       General: He is not in acute distress.    Appearance: He is well-developed and well-nourished. He is not ill-appearing, toxic-appearing or diaphoretic.  HENT:     Head: Normocephalic and atraumatic.     Mouth/Throat:     Mouth: Mucous membranes are moist.  Eyes:     Pupils: Pupils are equal, round, and reactive to light.  Cardiovascular:     Rate and Rhythm: Normal rate and regular rhythm.     Heart sounds: Normal heart sounds.  Pulmonary:     Effort: Pulmonary effort is normal. No respiratory distress.  Abdominal:     General: There is no distension.     Palpations: Abdomen is soft.     Tenderness: There is abdominal tenderness in the left lower quadrant. There is guarding. There is no right CVA tenderness, left CVA tenderness or rebound. Negative signs include Murphy's sign and McBurney's sign.     Hernia: No hernia is present.  Musculoskeletal:        General: Normal range of motion.     Cervical back: Normal range of motion and neck supple.  Skin:    General: Skin is warm and dry.     Capillary Refill: Capillary refill takes less than 2 seconds.  Neurological:     General: No focal deficit present.     Mental Status: He is alert and oriented to person, place, and time.  Psychiatric:        Mood and Affect: Mood and affect normal.     ED Results / Procedures / Treatments   Labs (all labs ordered are listed, but only abnormal results are displayed) Labs Reviewed  COMPREHENSIVE METABOLIC PANEL - Abnormal; Notable for the following components:      Result Value   Glucose, Bld 100 (*)    All other components within normal limits  URINALYSIS, ROUTINE W REFLEX MICROSCOPIC - Abnormal; Notable for the following components:   Hgb urine dipstick SMALL (*)    All other components within normal limits  LIPASE, BLOOD  CBC    EKG None  Radiology CT Abdomen Pelvis W Contrast  Result Date: 06/24/2020 CLINICAL DATA:  Left lower quadrant pain EXAM: CT ABDOMEN AND PELVIS WITH  CONTRAST TECHNIQUE: Multidetector CT imaging  of the abdomen and pelvis was performed using the standard protocol following bolus administration of intravenous contrast. CONTRAST:  19mL OMNIPAQUE IOHEXOL 300 MG/ML  SOLN COMPARISON:  12/09/2019 FINDINGS: Lower chest: No acute abnormality. Hepatobiliary: No focal hepatic abnormality. Gallbladder unremarkable. Pancreas: No focal abnormality or ductal dilatation. Spleen: No focal abnormality.  Normal size. Adrenals/Urinary Tract: No adrenal abnormality. No focal renal abnormality. No stones or hydronephrosis. Urinary bladder is unremarkable. Stomach/Bowel: Sigmoid and descending colonic diverticulosis. Inflammatory stranding around the proximal sigmoid colon compatible with active diverticulitis. Stomach and small bowel decompressed. Appendix is normal. Vascular/Lymphatic: No evidence of aneurysm or adenopathy. Reproductive: No visible focal abnormality. Other: No free fluid or free air. Musculoskeletal: No acute bony abnormality. IMPRESSION: Changes of proximal sigmoid diverticulitis. No complicating feature. Electronically Signed   By: Rolm Baptise M.D.   On: 06/24/2020 15:01    Procedures Procedures   Medications Ordered in ED Medications  HYDROmorphone (DILAUDID) injection 1 mg (has no administration in time range)  ondansetron (ZOFRAN) injection 4 mg (has no administration in time range)  cefTRIAXone (ROCEPHIN) 2 g in sodium chloride 0.9 % 100 mL IVPB (has no administration in time range)    And  metroNIDAZOLE (FLAGYL) IVPB 500 mg (has no administration in time range)  ondansetron (ZOFRAN) injection 4 mg (4 mg Intravenous Given 06/24/20 1206)  sodium chloride 0.9 % bolus 1,000 mL (0 mLs Intravenous Stopped 06/24/20 1425)  HYDROmorphone (DILAUDID) injection 0.5 mg (0.5 mg Intravenous Given 06/24/20 1206)  iohexol (OMNIPAQUE) 300 MG/ML solution 100 mL (100 mLs Intravenous Contrast Given 06/24/20 1442)   ED Course  I have reviewed the triage vital signs  and the nursing notes.  Pertinent labs & imaging results that were available during my care of the patient were reviewed by me and considered in my medical decision making (see chart for details).  50 year old, known history of recurrent diverticulitis presents for evaluation of left lower quadrant pain.  Began on Friday.  He is on day #3 of antibiotics.  Has not taken a dose of Augmentin today.  Having worsening pain.  Is been taking hydrocodone at home.  Initially had emesis, fever which has resolved.  No Covid symptoms.  His heart and lungs are clear.  His abdomen is diffusely tender to his left lower quadrant with guarding.  No rebound.  We will plan on labs, imaging and reassess  Labs and imaging personally reviewed and interpreted:  CBC without leukocytosis Lipase 19 UA negative for infection Metabolic panel with mild hyperglycemia 100, no additional electrolyte, renal or liver abnormality CT abdomen and pelvis with uncomplicated diverticulitis without abscess or perforation.  Patient reassessed.  States his pain prior to CT was well controlled however he got up, use bathroom, moving over to the CT table has increased his pain.  He is unsure if he wants to be admitted for pain control, IV antibiotics at this time.  Will give additional dose of pain management, first dose of IV antibiotics and reassess.  Patient's pain is controlled, would recommend DC home with Cipro, Flagyl.  If pain uncontrolled would bring into hospitalist service for pain management and IV antibiotics for diverticulitis.  Care transferred to [facility] who will follow up on reassessment and determine disposition.     MDM Rules/Calculators/A&P                          Final Clinical Impression(s) / ED Diagnoses Final diagnoses:  Diverticulitis    Rx /  DC Orders ED Discharge Orders    None       Lydian Chavous A, PA-C 06/24/20 1513    Dorie Rank, MD 06/25/20 [phone]

## 2020-06-24 NOTE — Discharge Instructions (Addendum)
You came to the emergency department to be evaluated for your left lower quadrant abdominal pain.  Your lab work was reassuring.  Your CT scan showed that you have complicated diverticulitis.  You were given IV antibiotics to treat your diverticulitis.  Please stop taking the Augmentin.  You were started on Cipro and Flagyl.  Please take these medications for the next 10 days as prescribed.  I have also prescribed 2 days worth of pain medication, please take this as prescribed.  Please do not drive or operate heavy machinery when taking this medication.  Please not combine this medication with alcohol or other drugs.  Please follow-up with your primary care provider in 1 week.  Please return to the emergency department for worsening symptoms.  You may have diarrhea from the antibiotics.  It is very important that you continue to take the antibiotics even if you get diarrhea unless a medical professional tells you that you may stop taking them.  If you stop too early the bacteria you are being treated for will become stronger and you may need different, more powerful antibiotics that have more side effects and worsening diarrhea.  Please stay well hydrated and consider probiotics as they may decrease the severity of your diarrhea.    Get help right away if: Your pain gets worse. Your symptoms do not get better. Your symptoms get worse very fast. You have a fever. You vomit more than one time. You have poop that is: Bloody. Black. Tarry.

## 2020-06-24 NOTE — ED Provider Notes (Signed)
°  Physical Exam  BP (!) 142/100    Pulse 87    Temp 98.5 F (36.9 C) (Oral)    Resp 12    Ht 5\' 9"  (1.753 m)    Wt 88.5 kg    SpO2 97%    BMI 28.80 kg/m   Physical Exam Vitals and nursing note reviewed.  Constitutional:      General: He is not in acute distress.    Appearance: He is not ill-appearing, toxic-appearing or diaphoretic.  HENT:     Head: Normocephalic.  Eyes:     General: No scleral icterus.       Right eye: No discharge.        Left eye: No discharge.  Cardiovascular:     Rate and Rhythm: Normal rate.  Pulmonary:     Effort: Pulmonary effort is normal.  Abdominal:     General: There is no distension.     Palpations: Abdomen is soft. There is no mass or pulsatile mass.     Tenderness: There is abdominal tenderness in the left lower quadrant. There is no guarding or rebound.  Skin:    General: Skin is warm and dry.     Coloration: Skin is not jaundiced or pale.  Neurological:     General: No focal deficit present.     Mental Status: He is alert.  Psychiatric:        Behavior: Behavior is cooperative.     ED Course/Procedures     Procedures  MDM   Care of patient assumed from PA Henderly  at 15:27.  Agree with history, physical exam and plan.  See their note for further details. Briefly, patient has history of recurrent diverticulitis presents with lower left quadrant pain.  Started Augmentin on Friday, did not take his dose today.  Has been taking hydrocodone at home for pain management.    CT abdomen pelvis shows changes proximal sigmoid diverticulitis with no complicating features.  Patient given medication for pain management in the hospital and first dose of IV antibiotics.    On reassessment she reports improvement in his pain and nausea.  Patient is agreeable with discharge home.  He was prescribed 10-day Cipro and Flagyl prescription to treat his diverticulitis.  Patient was also prescribed 2 day course of Percocet.  Prescribe Zofran for nausea.   Patient is to follow-up with his primary care provider.    Discussed results, findings, treatment and follow up. Patient advised of return precautions. Patient verbalized understanding and agreed with plan.     Dyann Ruddle 06/25/20 Alphonzo Severance, MD 06/27/20 [phone]

## 2020-07-03 ENCOUNTER — Other Ambulatory Visit: Payer: Self-pay

## 2020-07-03 ENCOUNTER — Ambulatory Visit: Payer: 59

## 2020-07-03 DIAGNOSIS — I4510 Unspecified right bundle-branch block: Secondary | ICD-10-CM

## 2020-07-10 ENCOUNTER — Ambulatory Visit: Payer: 59 | Admitting: Cardiology

## 2020-07-10 ENCOUNTER — Other Ambulatory Visit: Payer: Self-pay

## 2020-07-10 ENCOUNTER — Encounter: Payer: Self-pay | Admitting: Cardiology

## 2020-07-10 VITALS — BP 138/94 | HR 106 | Temp 98.3°F | Resp 17 | Ht 69.0 in | Wt 192.6 lb

## 2020-07-10 DIAGNOSIS — R002 Palpitations: Secondary | ICD-10-CM

## 2020-07-10 DIAGNOSIS — I10 Essential (primary) hypertension: Secondary | ICD-10-CM

## 2020-07-10 NOTE — Progress Notes (Signed)
Patient referred by [name], PA-C for palpitations  Subjective:   Benjamin [name], male    DOB: Jul 11, 1970, 50 y.o.   MRN: [id number]   Chief Complaint  Patient presents with  . Follow-up    4 WEEK     HPI  50 y.o. 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  Reviewed recent echocardiogram and cardiac monitor results with the patient, details below.  He has had occasional episode of shortness of breath at rest, lasting for 10 minutes, but is able to walk up to 3 months without any chest pain or shortness of breath.  He has occasional palpitation episodes.   Current Outpatient Medications on File Prior to Visit  Medication Sig Dispense Refill  . acetaminophen (TYLENOL) 325 MG tablet Take 650 mg by mouth every 6 (six) hours as needed for mild pain or headache.    Marland Kitchen amLODipine (NORVASC) 2.5 MG tablet Take 2.5 mg by mouth daily.    Marland Kitchen buPROPion (WELLBUTRIN XL) 300 MG 24 hr tablet Take 1 tablet (300 mg total) by mouth daily. 90 tablet 3  . esomeprazole (NEXIUM) 20 MG capsule Take 20 mg by mouth daily at 12 noon.    Marland Kitchen losartan (COZAAR) 25 MG tablet Take 2 tablets (50 mg total) by mouth daily. (Patient taking differently: Take 25 mg by mouth daily.) 60 tablet 0  . Multiple Vitamin (MULTIVITAMIN WITH MINERALS) TABS tablet Take 1 tablet by mouth daily.    . ondansetron (ZOFRAN) 4 MG tablet Take 1 tablet (4 mg total) by mouth every 8 (eight) hours as needed for nausea or vomiting. 12 tablet 0  . rivaroxaban (XARELTO) 10 MG TABS tablet Take 1 tablet (10 mg total) by mouth daily. 30 tablet 0  . rosuvastatin (CRESTOR) 40 MG tablet Take 1 tablet (40 mg total) by mouth once a week. (Patient taking differently: Take 40 mg by mouth every Friday.) 4 tablet 12  . tamsulosin (FLOMAX) 0.4 MG CAPS capsule Take 1 capsule (0.4 mg total) by mouth daily. 90 capsule 1  . TROKENDI XR 25 MG CP24 TAKE 2  CAPSULES BY MOUTH EVERY DAY (Patient not taking: Reported on 06/24/2020) 60 capsule 0  . valACYclovir (VALTREX) 500 MG tablet Take 1 tablet (500 mg total) by mouth daily. 30 tablet 12  . [DISCONTINUED] lisinopril-hydrochlorothiazide (PRINZIDE,ZESTORETIC) 20-25 MG per tablet Take 1 tablet by mouth daily. 90 tablet 3   Current Facility-Administered Medications on File Prior to Visit  Medication Dose Route Frequency Provider Last Rate Last Admin  . gadopentetate dimeglumine (MAGNEVIST) injection 18 mL  18 mL Intravenous Once PRN Melvenia Beam, MD        Cardiovascular and other pertinent studies:  Mobile cardiac telemetry 13 days 06/12/2020 - 06/25/2020: Dominant rhythm: Sinus. HR 61-163 bpm. Avg HR 97 bpm. 1 episodes of probable atrial tachycardia, at 150 bpm for 6 bets <1% isolated SVE, couplet/triplets. <1% isolated VE, no couplet/triplets. No atrial fibrillation/atrial flutter/VT/high grade AV block, sinus pause >3sec noted. 2 patient triggered events. Correlated with sinus tachycardia/supraventricular ectopy  Echocardiogram 07/03/2020:  Left ventricle cavity is normal in size and wall thickness. Normal global  wall motion. Normal LV systolic function with EF 65%. Normal diastolic  filling pattern.  Mild (Grade I) mitral regurgitation.  No evidence of pulmonary hypertension.  EKG 06/12/2020: Sinus rhythm 86 bpm  Right bundle branch block  Left atrial enlargement   Recent labs: 05/09/2020:  Glucose 91, BUN/Cr 11/1.5. EGFR 54. Na/K 144/3.2. Rest of the CMP normal H/H 15.6/45.2. MCV 88. Platelets 323 HbA1C 5.1% Chol 190, TG 215, HDL 41, LDL 106    Review of Systems  Cardiovascular: Positive for palpitations. Negative for chest pain, dyspnea on exertion, leg swelling and syncope.        Vitals:   07/10/20 1452  BP: (!) 138/94  Pulse: (!) 106  Resp: 17  Temp: 98.3 F (36.8 C)  SpO2: 98%     Body mass index is 28.44 kg/m. Filed Weights   07/10/20 1452  Weight:  192 lb 9.6 oz (87.4 kg)     Objective:   Physical Exam Vitals and nursing note reviewed.  Constitutional:      General: He is not in acute distress. Neck:     Vascular: No JVD.  Cardiovascular:     Rate and Rhythm: Normal rate and regular rhythm.     Heart sounds: Normal heart sounds. No murmur heard.   Pulmonary:     Effort: Pulmonary effort is normal.     Breath sounds: Normal breath sounds. No wheezing or rales.  Neurological:     Deep Tendon Reflexes: Abnormal reflex:          Assessment & Recommendations:   50 y.o. 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 Discuss management with beta-blocker.  He is currently on bupropion, which interacts with metoprolol.  I have encouraged him to talk to his PCP regarding alternate options for bupropion.  If he is able to come off bupropion, will then recommend metoprolol tartrate 25 mg twice daily.  Otherwise, continue watchful monitoring.  Hypertension: Continue current antihypertensive therapy.  Encouraged him to keep a log of blood pressure.  F/u in 3 months   [name], MD Pager: [phone] Office: [phone]

## 2020-10-09 ENCOUNTER — Encounter: Payer: Self-pay | Admitting: Cardiology

## 2020-10-09 ENCOUNTER — Ambulatory Visit: Payer: 59 | Admitting: Cardiology

## 2020-10-09 ENCOUNTER — Other Ambulatory Visit: Payer: Self-pay

## 2020-10-09 VITALS — BP 145/99 | HR 100 | Temp 98.2°F | Resp 17 | Ht 69.0 in | Wt 196.0 lb

## 2020-10-09 DIAGNOSIS — I10 Essential (primary) hypertension: Secondary | ICD-10-CM

## 2020-10-09 DIAGNOSIS — I491 Atrial premature depolarization: Secondary | ICD-10-CM

## 2020-10-09 MED ORDER — DILTIAZEM HCL ER COATED BEADS 240 MG PO CP24
240.0000 mg | ORAL_CAPSULE | Freq: Every day | ORAL | 3 refills | Status: DC
Start: 2020-10-09 — End: 2021-02-19

## 2020-10-09 NOTE — Progress Notes (Signed)
Patient referred by Benjamin Coop, PA-C for palpitations  Subjective:   Benjamin [name], male    DOB: 08-10-1970, 50 y.o.   MRN: [id number]   Chief Complaint  Patient presents with  . Palpitations  . Hypertension  . Follow-up    3 month     HPI  50 y.o. 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  Shortness of breath has improved. Heart rate and blood pressure remain elevated.    Current Outpatient Medications on File Prior to Visit  Medication Sig Dispense Refill  . acetaminophen (TYLENOL) 325 MG tablet Take 650 mg by mouth every 6 (six) hours as needed for mild pain or headache.    Marland Kitchen amLODipine (NORVASC) 2.5 MG tablet Take 2.5 mg by mouth daily.    Marland Kitchen buPROPion (WELLBUTRIN XL) 300 MG 24 hr tablet Take 1 tablet (300 mg total) by mouth daily. 90 tablet 3  . esomeprazole (NEXIUM) 20 MG capsule Take 20 mg by mouth daily at 12 noon.    Marland Kitchen losartan (COZAAR) 25 MG tablet Take 2 tablets (50 mg total) by mouth daily. (Patient taking differently: Take 25 mg by mouth daily.) 60 tablet 0  . Multiple Vitamin (MULTIVITAMIN WITH MINERALS) TABS tablet Take 1 tablet by mouth daily.    . ondansetron (ZOFRAN) 4 MG tablet Take 1 tablet (4 mg total) by mouth every 8 (eight) hours as needed for nausea or vomiting. 12 tablet 0  . rosuvastatin (CRESTOR) 40 MG tablet Take 1 tablet (40 mg total) by mouth once a week. (Patient taking differently: Take 40 mg by mouth every Friday.) 4 tablet 12  . tamsulosin (FLOMAX) 0.4 MG CAPS capsule Take 1 capsule (0.4 mg total) by mouth daily. 90 capsule 1  . valACYclovir (VALTREX) 500 MG tablet Take 1 tablet (500 mg total) by mouth daily. 30 tablet 12  . [DISCONTINUED] lisinopril-hydrochlorothiazide (PRINZIDE,ZESTORETIC) 20-25 MG per tablet Take 1 tablet by mouth daily. 90 tablet 3   Current Facility-Administered Medications on File Prior to Visit  Medication Dose  Route Frequency Provider Last Rate Last Admin  . gadopentetate dimeglumine (MAGNEVIST) injection 18 mL  18 mL Intravenous Once PRN Melvenia Beam, MD        Cardiovascular and other pertinent studies:  Mobile cardiac telemetry 13 days 06/12/2020 - 06/25/2020: Dominant rhythm: Sinus. HR 61-163 bpm. Avg HR 97 bpm. 1 episodes of probable atrial tachycardia, at 150 bpm for 6 bets <1% isolated SVE, couplet/triplets. <1% isolated VE, no couplet/triplets. No atrial fibrillation/atrial flutter/VT/high grade AV block, sinus pause >3sec noted. 2 patient triggered events. Correlated with sinus tachycardia/supraventricular ectopy  Echocardiogram 07/03/2020:  Left ventricle cavity is normal in size and wall thickness. Normal global  wall motion. Normal LV systolic function with EF 65%. Normal diastolic  filling pattern.  Mild (Grade I) mitral regurgitation.  No evidence of pulmonary hypertension.  EKG 06/12/2020: Sinus rhythm 86 bpm  Right bundle branch block  Left atrial enlargement   Recent labs: 05/09/2020: Glucose 91, BUN/Cr 11/1.5. EGFR 54. Na/K 144/3.2. Rest of the CMP normal H/H 15.6/45.2. MCV 88. Platelets 323 HbA1C 5.1% Chol 190, TG 215, HDL 41, LDL 106    Review of Systems  Cardiovascular: Positive for palpitations. Negative for chest pain, dyspnea on exertion, leg swelling and syncope.        Vitals:   10/09/20 1134  BP: (!) 154/102  Pulse: 89  Resp: 17  Temp:  98.2 F (36.8 C)  SpO2: 97%     Body mass index is 28.94 kg/m. Filed Weights   10/09/20 1134  Weight: 196 lb (88.9 kg)     Objective:   Physical Exam Vitals and nursing note reviewed.  Constitutional:      General: He is not in acute distress. Neck:     Vascular: No JVD.  Cardiovascular:     Rate and Rhythm: Normal rate and regular rhythm.     Heart sounds: Normal heart sounds. No murmur heard.   Pulmonary:     Effort: Pulmonary effort is normal.     Breath sounds: Normal breath sounds. No  wheezing or rales.  Neurological:     Deep Tendon Reflexes: Abnormal reflex:          Assessment & Recommendations:   50 y.o. 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 Unable to use beta blocker as it interacts with bupropion. Started diltiazem 240 mg daily. Stopped amlodipine.  Hypertension: Change made as above  F/u in 3 months   Benjamin Mormon, MD Pager: [phone] Office: [phone]

## 2021-01-08 NOTE — Progress Notes (Deleted)
Patient referred by Benjamin Coop, PA-C for palpitations  Subjective:   Benjamin [name], male    DOB: 01/28/1971, 50 y.o.   MRN: [id number]   No chief complaint on file.    HPI  50 y.o. 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  ***Shortness of breath has improved. Heart rate and blood pressure remain elevated.    Current Outpatient Medications on File Prior to Visit  Medication Sig Dispense Refill   acetaminophen (TYLENOL) 325 MG tablet Take 650 mg by mouth every 6 (six) hours as needed for mild pain or headache.     buPROPion (WELLBUTRIN XL) 300 MG 24 hr tablet Take 1 tablet (300 mg total) by mouth daily. 90 tablet 3   diltiazem (CARDIZEM CD) 240 MG 24 hr capsule Take 1 capsule (240 mg total) by mouth daily. 30 capsule 3   esomeprazole (NEXIUM) 20 MG capsule Take 20 mg by mouth daily at 12 noon.     losartan (COZAAR) 25 MG tablet Take 2 tablets (50 mg total) by mouth daily. (Patient taking differently: Take 25 mg by mouth daily.) 60 tablet 0   Multiple Vitamin (MULTIVITAMIN WITH MINERALS) TABS tablet Take 1 tablet by mouth daily.     ondansetron (ZOFRAN) 4 MG tablet Take 1 tablet (4 mg total) by mouth every 8 (eight) hours as needed for nausea or vomiting. 12 tablet 0   rosuvastatin (CRESTOR) 40 MG tablet Take 1 tablet (40 mg total) by mouth once a week. (Patient taking differently: Take 40 mg by mouth every Friday.) 4 tablet 12   tamsulosin (FLOMAX) 0.4 MG CAPS capsule Take 1 capsule (0.4 mg total) by mouth daily. 90 capsule 1   valACYclovir (VALTREX) 500 MG tablet Take 1 tablet (500 mg total) by mouth daily. 30 tablet 12   [DISCONTINUED] lisinopril-hydrochlorothiazide (PRINZIDE,ZESTORETIC) 20-25 MG per tablet Take 1 tablet by mouth daily. 90 tablet 3   Current Facility-Administered Medications on File Prior to Visit  Medication Dose Route Frequency Provider Last Rate Last  Admin   gadopentetate dimeglumine (MAGNEVIST) injection 18 mL  18 mL Intravenous Once PRN Melvenia Beam, MD        Cardiovascular and other pertinent studies:  Mobile cardiac telemetry 13 days 06/12/2020 - 06/25/2020: Dominant rhythm: Sinus. HR 61-163 bpm. Avg HR 97 bpm. 1 episodes of probable atrial tachycardia, at 150 bpm for 6 bets <1% isolated SVE, couplet/triplets. <1% isolated VE, no couplet/triplets. No atrial fibrillation/atrial flutter/VT/high grade AV block, sinus pause >3sec noted. 2 patient triggered events. Correlated with sinus tachycardia/supraventricular ectopy  Echocardiogram 07/03/2020:  Left ventricle cavity is normal in size and wall thickness. Normal global  wall motion. Normal LV systolic function with EF 65%. Normal diastolic  filling pattern.  Mild (Grade I) mitral regurgitation.  No evidence of pulmonary hypertension.  EKG 06/12/2020: Sinus rhythm 86 bpm  Right bundle branch block  Left atrial enlargement   Recent labs: 05/09/2020: Glucose 91, BUN/Cr 11/1.5. EGFR 54. Na/K 144/3.2. Rest of the CMP normal H/H 15.6/45.2. MCV 88. Platelets 323 HbA1C 5.1% Chol 190, TG 215, HDL 41, LDL 106    ROS      There were no vitals filed for this visit.    There is no height or weight on file to calculate BMI. There were no vitals filed for this visit.    Objective:   Physical Exam      Assessment & Recommendations:  50 y.o. 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 Unable to use beta blocker as it interacts with bupropion. Started diltiazem 240 mg daily. Stopped amlodipine.  *** Hypertension: Change made as above  *** F/u in 3 months   Benjamin Mormon, MD Pager: [phone] Office: [phone]

## 2021-01-09 ENCOUNTER — Ambulatory Visit: Payer: 59 | Admitting: Cardiology

## 2021-02-06 ENCOUNTER — Encounter: Payer: Self-pay | Admitting: Gastroenterology

## 2021-02-19 ENCOUNTER — Other Ambulatory Visit: Payer: Self-pay | Admitting: Cardiology

## 2021-02-19 DIAGNOSIS — I10 Essential (primary) hypertension: Secondary | ICD-10-CM

## 2021-02-19 DIAGNOSIS — I491 Atrial premature depolarization: Secondary | ICD-10-CM

## 2022-09-22 ENCOUNTER — Emergency Department (HOSPITAL_BASED_OUTPATIENT_CLINIC_OR_DEPARTMENT_OTHER): Payer: BC Managed Care – PPO

## 2022-09-22 ENCOUNTER — Other Ambulatory Visit: Payer: Self-pay

## 2022-09-22 ENCOUNTER — Other Ambulatory Visit (HOSPITAL_BASED_OUTPATIENT_CLINIC_OR_DEPARTMENT_OTHER): Payer: Self-pay

## 2022-09-22 ENCOUNTER — Inpatient Hospital Stay (HOSPITAL_BASED_OUTPATIENT_CLINIC_OR_DEPARTMENT_OTHER)
Admission: EM | Admit: 2022-09-22 | Discharge: 2022-09-27 | DRG: 872 | Disposition: A | Discharge status: Home / Self Care | Payer: BC Managed Care – PPO | Attending: Internal Medicine | Admitting: Internal Medicine

## 2022-09-22 ENCOUNTER — Encounter (HOSPITAL_BASED_OUTPATIENT_CLINIC_OR_DEPARTMENT_OTHER): Payer: Self-pay

## 2022-09-22 DIAGNOSIS — Z8719 Personal history of other diseases of the digestive system: Secondary | ICD-10-CM

## 2022-09-22 DIAGNOSIS — Z8249 Family history of ischemic heart disease and other diseases of the circulatory system: Secondary | ICD-10-CM | POA: Diagnosis not present

## 2022-09-22 DIAGNOSIS — Z83438 Family history of other disorder of lipoprotein metabolism and other lipidemia: Secondary | ICD-10-CM | POA: Diagnosis not present

## 2022-09-22 DIAGNOSIS — F32A Depression, unspecified: Secondary | ICD-10-CM | POA: Diagnosis present

## 2022-09-22 DIAGNOSIS — Z87448 Personal history of other diseases of urinary system: Secondary | ICD-10-CM

## 2022-09-22 DIAGNOSIS — Z86010 Personal history of colonic polyps: Secondary | ICD-10-CM | POA: Diagnosis not present

## 2022-09-22 DIAGNOSIS — R109 Unspecified abdominal pain: Secondary | ICD-10-CM | POA: Diagnosis present

## 2022-09-22 DIAGNOSIS — Z86718 Personal history of other venous thrombosis and embolism: Secondary | ICD-10-CM | POA: Diagnosis not present

## 2022-09-22 DIAGNOSIS — E871 Hypo-osmolality and hyponatremia: Secondary | ICD-10-CM | POA: Diagnosis not present

## 2022-09-22 DIAGNOSIS — Z888 Allergy status to other drugs, medicaments and biological substances status: Secondary | ICD-10-CM | POA: Diagnosis not present

## 2022-09-22 DIAGNOSIS — G4733 Obstructive sleep apnea (adult) (pediatric): Secondary | ICD-10-CM | POA: Diagnosis present

## 2022-09-22 DIAGNOSIS — Z79899 Other long term (current) drug therapy: Secondary | ICD-10-CM

## 2022-09-22 DIAGNOSIS — N400 Enlarged prostate without lower urinary tract symptoms: Secondary | ICD-10-CM | POA: Diagnosis present

## 2022-09-22 DIAGNOSIS — A419 Sepsis, unspecified organism: Principal | ICD-10-CM

## 2022-09-22 DIAGNOSIS — I10 Essential (primary) hypertension: Secondary | ICD-10-CM | POA: Diagnosis present

## 2022-09-22 DIAGNOSIS — D682 Hereditary deficiency of other clotting factors: Secondary | ICD-10-CM | POA: Diagnosis present

## 2022-09-22 DIAGNOSIS — L409 Psoriasis, unspecified: Secondary | ICD-10-CM | POA: Diagnosis present

## 2022-09-22 DIAGNOSIS — Z801 Family history of malignant neoplasm of trachea, bronchus and lung: Secondary | ICD-10-CM | POA: Diagnosis not present

## 2022-09-22 DIAGNOSIS — Z833 Family history of diabetes mellitus: Secondary | ICD-10-CM

## 2022-09-22 DIAGNOSIS — K5732 Diverticulitis of large intestine without perforation or abscess without bleeding: Secondary | ICD-10-CM | POA: Diagnosis present

## 2022-09-22 DIAGNOSIS — G43909 Migraine, unspecified, not intractable, without status migrainosus: Secondary | ICD-10-CM | POA: Diagnosis not present

## 2022-09-22 DIAGNOSIS — E785 Hyperlipidemia, unspecified: Secondary | ICD-10-CM | POA: Diagnosis present

## 2022-09-22 DIAGNOSIS — E876 Hypokalemia: Secondary | ICD-10-CM | POA: Diagnosis not present

## 2022-09-22 DIAGNOSIS — K219 Gastro-esophageal reflux disease without esophagitis: Secondary | ICD-10-CM | POA: Diagnosis present

## 2022-09-22 DIAGNOSIS — K5792 Diverticulitis of intestine, part unspecified, without perforation or abscess without bleeding: Secondary | ICD-10-CM | POA: Diagnosis not present

## 2022-09-22 LAB — URINALYSIS, ROUTINE W REFLEX MICROSCOPIC
Bilirubin Urine: NEGATIVE
Glucose, UA: NEGATIVE mg/dL
Hgb urine dipstick: NEGATIVE
Ketones, ur: NEGATIVE mg/dL
Leukocytes,Ua: NEGATIVE
Nitrite: NEGATIVE
Protein, ur: NEGATIVE mg/dL
Specific Gravity, Urine: 1.007 (ref 1.005–1.030)
pH: 7 (ref 5.0–8.0)

## 2022-09-22 LAB — CBC
HCT: 45.7 % (ref 39.0–52.0)
Hemoglobin: 15.8 g/dL (ref 13.0–17.0)
MCH: 30.8 pg (ref 26.0–34.0)
MCHC: 34.6 g/dL (ref 30.0–36.0)
MCV: 89.1 fL (ref 80.0–100.0)
Platelets: 409 10*3/uL — ABNORMAL HIGH (ref 150–400)
RBC: 5.13 MIL/uL (ref 4.22–5.81)
RDW: 12.3 % (ref 11.5–15.5)
WBC: 18.6 10*3/uL — ABNORMAL HIGH (ref 4.0–10.5)
nRBC: 0 % (ref 0.0–0.2)

## 2022-09-22 LAB — LIPASE, BLOOD: Lipase: <10 U/L — ABNORMAL LOW (ref 11–51)

## 2022-09-22 LAB — COMPREHENSIVE METABOLIC PANEL
ALT: 16 U/L (ref 0–44)
AST: 17 U/L (ref 15–41)
Albumin: 4.8 g/dL (ref 3.5–5.0)
Alkaline Phosphatase: 77 U/L (ref 38–126)
Anion gap: 10 (ref 5–15)
BUN: 13 mg/dL (ref 6–20)
CO2: 26 mmol/L (ref 22–32)
Calcium: 10.1 mg/dL (ref 8.9–10.3)
Chloride: 101 mmol/L (ref 98–111)
Creatinine, Ser: 1.13 mg/dL (ref 0.61–1.24)
GFR, Estimated: >60 mL/min (ref >60)
Glucose, Bld: 96 mg/dL (ref 70–99)
Potassium: 3.6 mmol/L (ref 3.5–5.1)
Sodium: 137 mmol/L (ref 135–145)
Total Bilirubin: 1.3 mg/dL — ABNORMAL HIGH (ref 0.3–1.2)
Total Protein: 8.1 g/dL (ref 6.5–8.1)

## 2022-09-22 MED ORDER — OXYCODONE HCL 5 MG PO TABS
5.0000 mg | ORAL_TABLET | ORAL | Status: DC | PRN
  Administered 2022-09-23: 5 mg via ORAL
  Filled 2022-09-22 (×2): qty 1

## 2022-09-22 MED ORDER — CIPROFLOXACIN IN D5W 400 MG/200ML IV SOLN
400.0000 mg | Freq: Once | INTRAVENOUS | Status: AC
  Administered 2022-09-22: 400 mg via INTRAVENOUS
  Filled 2022-09-22: qty 200

## 2022-09-22 MED ORDER — HYDROMORPHONE HCL 1 MG/ML IJ SOLN
0.5000 mg | INTRAMUSCULAR | Status: DC | PRN
  Administered 2022-09-22 – 2022-09-26 (×15): 1 mg via INTRAVENOUS
  Filled 2022-09-22 (×16): qty 1

## 2022-09-22 MED ORDER — SODIUM CHLORIDE 0.9 % IV BOLUS
500.0000 mL | Freq: Once | INTRAVENOUS | Status: AC
  Administered 2022-09-22: 500 mL via INTRAVENOUS

## 2022-09-22 MED ORDER — ONDANSETRON HCL 4 MG/2ML IJ SOLN
4.0000 mg | Freq: Four times a day (QID) | INTRAMUSCULAR | Status: DC | PRN
  Administered 2022-09-22 – 2022-09-25 (×6): 4 mg via INTRAVENOUS
  Filled 2022-09-22 (×6): qty 2

## 2022-09-22 MED ORDER — DILTIAZEM HCL ER COATED BEADS 240 MG PO CP24
240.0000 mg | ORAL_CAPSULE | Freq: Every day | ORAL | Status: DC
  Administered 2022-09-22 – 2022-09-27 (×6): 240 mg via ORAL
  Filled 2022-09-22 (×6): qty 1

## 2022-09-22 MED ORDER — CIPROFLOXACIN IN D5W 400 MG/200ML IV SOLN
400.0000 mg | Freq: Two times a day (BID) | INTRAVENOUS | Status: DC
  Administered 2022-09-23 – 2022-09-24 (×3): 400 mg via INTRAVENOUS
  Filled 2022-09-22 (×3): qty 200

## 2022-09-22 MED ORDER — MORPHINE SULFATE (PF) 4 MG/ML IV SOLN
4.0000 mg | Freq: Once | INTRAVENOUS | Status: AC
  Administered 2022-09-22: 4 mg via INTRAVENOUS
  Filled 2022-09-22: qty 1

## 2022-09-22 MED ORDER — HYDROMORPHONE HCL 1 MG/ML IJ SOLN
1.0000 mg | Freq: Once | INTRAMUSCULAR | Status: AC
  Administered 2022-09-22: 1 mg via INTRAVENOUS
  Filled 2022-09-22: qty 1

## 2022-09-22 MED ORDER — METRONIDAZOLE 500 MG/100ML IV SOLN
500.0000 mg | Freq: Once | INTRAVENOUS | Status: AC
  Administered 2022-09-22: 500 mg via INTRAVENOUS
  Filled 2022-09-22: qty 100

## 2022-09-22 MED ORDER — LOSARTAN POTASSIUM 50 MG PO TABS
50.0000 mg | ORAL_TABLET | Freq: Every day | ORAL | Status: DC
  Administered 2022-09-22 – 2022-09-27 (×6): 50 mg via ORAL
  Filled 2022-09-22 (×6): qty 1

## 2022-09-22 MED ORDER — TRAZODONE HCL 50 MG PO TABS: 25.0000 mg | ORAL_TABLET | Freq: Every evening | ORAL | Status: DC | PRN

## 2022-09-22 MED ORDER — ALBUTEROL SULFATE (2.5 MG/3ML) 0.083% IN NEBU: 2.5000 mg | INHALATION_SOLUTION | RESPIRATORY_TRACT | Status: DC | PRN

## 2022-09-22 MED ORDER — DROPERIDOL 2.5 MG/ML IJ SOLN: 2.5000 mg | Freq: Once | INTRAMUSCULAR | Status: DC

## 2022-09-22 MED ORDER — VALACYCLOVIR HCL 500 MG PO TABS
500.0000 mg | ORAL_TABLET | Freq: Every day | ORAL | Status: DC
  Administered 2022-09-23 – 2022-09-27 (×4): 500 mg via ORAL
  Filled 2022-09-22 (×5): qty 1

## 2022-09-22 MED ORDER — ACETAMINOPHEN 650 MG RE SUPP: 650.0000 mg | Freq: Four times a day (QID) | RECTAL | Status: DC | PRN

## 2022-09-22 MED ORDER — ONDANSETRON HCL 4 MG/2ML IJ SOLN
4.0000 mg | Freq: Once | INTRAMUSCULAR | Status: AC
  Administered 2022-09-22: 4 mg via INTRAVENOUS
  Filled 2022-09-22: qty 2

## 2022-09-22 MED ORDER — ENOXAPARIN SODIUM 40 MG/0.4ML IJ SOSY
40.0000 mg | PREFILLED_SYRINGE | INTRAMUSCULAR | Status: DC
  Administered 2022-09-22 – 2022-09-26 (×5): 40 mg via SUBCUTANEOUS
  Filled 2022-09-22 (×5): qty 0.4

## 2022-09-22 MED ORDER — ACETAMINOPHEN 325 MG PO TABS
650.0000 mg | ORAL_TABLET | Freq: Four times a day (QID) | ORAL | Status: DC | PRN
  Administered 2022-09-23 (×2): 650 mg via ORAL
  Filled 2022-09-22 (×3): qty 2

## 2022-09-22 MED ORDER — METOPROLOL TARTRATE 5 MG/5ML IV SOLN: 5.0000 mg | Freq: Four times a day (QID) | INTRAVENOUS | Status: DC | PRN

## 2022-09-22 MED ORDER — IOHEXOL 300 MG/ML  SOLN
100.0000 mL | Freq: Once | INTRAMUSCULAR | Status: AC | PRN
  Administered 2022-09-22: 80 mL via INTRAVENOUS

## 2022-09-22 MED ORDER — ONDANSETRON HCL 4 MG PO TABS
4.0000 mg | ORAL_TABLET | Freq: Four times a day (QID) | ORAL | Status: DC | PRN
  Administered 2022-09-26 (×2): 4 mg via ORAL
  Filled 2022-09-22 (×2): qty 1

## 2022-09-22 MED ORDER — METRONIDAZOLE 500 MG/100ML IV SOLN
500.0000 mg | Freq: Two times a day (BID) | INTRAVENOUS | Status: DC
  Administered 2022-09-23 – 2022-09-24 (×3): 500 mg via INTRAVENOUS
  Filled 2022-09-22 (×3): qty 100

## 2022-09-22 MED ORDER — POTASSIUM CHLORIDE IN NACL 20-0.9 MEQ/L-% IV SOLN
INTRAVENOUS | Status: DC
  Filled 2022-09-22 (×2): qty 1000

## 2022-09-22 NOTE — Progress Notes (Signed)
  PROGRESS NOTE    Benjamin [name]  WUJ:[id number] DOB: 05-28-1971 DOA: 09/22/2022 PCP: Silvestre Mesi   Brief Narrative:  52 year old male presents with intractable abdominal pain, imaging consistent with acute sigmoid diverticulitis with surrounding inflammation and small free fluid no obvious bowel perforation obstruction or abscess noted on imaging.  Patient otherwise has known history of hypertension previous history of provoked DVT no longer on anticoagulation, psoriasis, lumbago, anxiety/depression and herpes labialis.  Given intractable abdominal pain poor p.o. intake and need for IV pain control limit patient has inpatient to [facility] system.  Pending clinical course patient may need surgical evaluation but at this time it is not warranted.  Assessment & Plan:   Principal Problem:   Acute diverticulitis   Azucena Fallen, DO [facility]  If 7PM-7AM, please contact night-coverage [email or web address]  09/22/2022, 5:06 PM

## 2022-09-22 NOTE — ED Notes (Signed)
Branndon Tuite - wife - [phone]  Call with room number and let her know when pt is transported.

## 2022-09-22 NOTE — ED Triage Notes (Signed)
Patient here POV from Home.  Endorses LLQ/Lower ABD Pain that began at 0200 today. 100.1 Temp at 0400. Moderate Nausea. No Emesis or Diarrhea. Intermittent.    NAD Noted during Triage. A&Ox4. GCS 15. Ambulatory.

## 2022-09-22 NOTE — ED Provider Notes (Signed)
Benjamin [name] EMERGENCY DEPARTMENT AT [facility] Provider Note   CSN: [id number] Arrival date & time: 09/22/22  1132     History  Chief Complaint  Patient presents with   Abdominal Pain    Benjamin [name] is a 52 y.o. male.  HPI   52 year old male with past medical history of diverticulitis presents emergency department left lower quadrant abdominal pain that started around 2 AM this morning.  He endorses low-grade temperature, mild nausea.  Denies any diarrhea or bloody stools.  No genitourinary symptoms.  Patient states when this happens he typically gets treated with Augmentin.  Denies any previous surgeries in the abdomen.  Home Medications Prior to Admission medications   Medication Sig Start Date End Date Taking? Authorizing Provider  acetaminophen (TYLENOL) 325 MG tablet Take 650 mg by mouth every 6 (six) hours as needed for mild pain or headache.    [provider]  buPROPion (WELLBUTRIN XL) 300 MG 24 hr tablet Take 1 tablet (300 mg total) by mouth daily. 01/25/18   Ofilia Neas, PA-C  diltiazem (CARDIZEM CD) 240 MG 24 hr capsule TAKE 1 CAPSULE BY MOUTH EVERY DAY 02/19/21   Patwardhan, Manish J, MD  esomeprazole (NEXIUM) 20 MG capsule Take 20 mg by mouth daily at 12 noon.    [provider]  losartan (COZAAR) 25 MG tablet Take 2 tablets (50 mg total) by mouth daily. Patient taking differently: Take 25 mg by mouth daily. 10/12/19 12/08/28  Margie Ege A, DO  Multiple Vitamin (MULTIVITAMIN WITH MINERALS) TABS tablet Take 1 tablet by mouth daily.    [provider]  ondansetron (ZOFRAN) 4 MG tablet Take 1 tablet (4 mg total) by mouth every 8 (eight) hours as needed for nausea or vomiting. 06/24/20   Haskel Schroeder, PA-C  rosuvastatin (CRESTOR) 40 MG tablet Take 1 tablet (40 mg total) by mouth once a week. Patient taking differently: Take 40 mg by mouth every Friday. 12/27/17   Ofilia Neas, PA-C  tamsulosin (FLOMAX) 0.4 MG CAPS  capsule Take 1 capsule (0.4 mg total) by mouth daily. 04/05/18   Benjiman Core D, PA-C  valACYclovir (VALTREX) 500 MG tablet Take 1 tablet (500 mg total) by mouth daily. 12/30/17   Ofilia Neas, PA-C  lisinopril-hydrochlorothiazide (PRINZIDE,ZESTORETIC) 20-25 MG per tablet Take 1 tablet by mouth daily. 07/24/11 08/21/11  Gordy Savers, MD      Allergies    Bismuth subsalicylate and Compazine [prochlorperazine edisylate]    Review of Systems   Review of Systems  Constitutional:  Positive for chills, fatigue and fever.  Respiratory:  Negative for shortness of breath.   Cardiovascular:  Negative for chest pain.  Gastrointestinal:  Positive for abdominal pain and nausea. Negative for blood in stool, diarrhea and vomiting.  Genitourinary:  Negative for dysuria and hematuria.  Musculoskeletal:  Negative for back pain.  Skin:  Negative for rash.  Neurological:  Negative for headaches.    Physical Exam Updated Vital Signs BP (!) 149/117 (BP Location: Right Arm)   Pulse (!) 101   Temp 97.9 F (36.6 C)   Resp 20   Ht  (1.753 m)   Wt 86.2 kg   SpO2 100%   BMI 28.06 kg/m  Physical Exam Vitals and nursing note reviewed.  Constitutional:      General: He is not in acute distress.    Appearance: Normal appearance.  HENT:     Head: Normocephalic.     Mouth/Throat:  Mouth: Mucous membranes are moist.  Cardiovascular:     Rate and Rhythm: Normal rate.  Pulmonary:     Effort: Pulmonary effort is normal. No respiratory distress.  Abdominal:     General: Bowel sounds are normal. There is no distension.     Palpations: Abdomen is soft.     Tenderness: There is abdominal tenderness in the left lower quadrant. There is guarding and rebound.  Skin:    General: Skin is warm.  Neurological:     Mental Status: He is alert and oriented to person, place, and time. Mental status is at baseline.  Psychiatric:        Mood and Affect: Mood normal.     ED Results / Procedures  / Treatments   Labs (all labs ordered are listed, but only abnormal results are displayed) Labs Reviewed  LIPASE, BLOOD - Abnormal; Notable for the following components:      Result Value   Lipase <10 (*)    All other components within normal limits  COMPREHENSIVE METABOLIC PANEL - Abnormal; Notable for the following components:   Total Bilirubin 1.3 (*)    All other components within normal limits  CBC - Abnormal; Notable for the following components:   WBC 18.6 (*)    Platelets 409 (*)    All other components within normal limits  URINALYSIS, ROUTINE W REFLEX MICROSCOPIC - Abnormal; Notable for the following components:   Color, Urine COLORLESS (*)    All other components within normal limits    EKG None  Radiology No results found.  Procedures Procedures    Medications Ordered in ED Medications  sodium chloride 0.9 % bolus 500 mL (500 mLs Intravenous New Bag/Given 09/22/22 1323)  morphine (PF) 4 MG/ML injection 4 mg (4 mg Intravenous Given 09/22/22 1325)  ondansetron (ZOFRAN) injection 4 mg (4 mg Intravenous Given 09/22/22 1324)  iohexol (OMNIPAQUE) 300 MG/ML solution 100 mL (80 mLs Intravenous Contrast Given 09/22/22 1344)    ED Course/ Medical Decision Making/ A&P                             Medical Decision Making Amount and/or Complexity of Data Reviewed Labs: ordered. Radiology: ordered.  Risk Prescription drug management.   52 year old male presents emergency department left lower quadrant abdominal pain.  He has had previous episodes of diverticulitis, this feels similar.  He is tachycardic on arrival, very tender in the left lower quadrant.  Blood work shows leukocytosis, otherwise normal labs.  CT of the abdomen pelvis shows sigmoid diverticulitis, small amount of free fluid but no other complications.  After IV medications patient feels improved.  Will plan for outpatient pain control and antibiotics with GI follow-up.        Final Clinical  Impression(s) / ED Diagnoses Final diagnoses:  None    Rx / DC Orders ED Discharge Orders     None         Rozelle Logan, DO 09/22/22 1546

## 2022-09-22 NOTE — ED Provider Notes (Signed)
  Physical Exam  BP (!) 148/110   Pulse 87   Temp 97.9 F (36.6 C)   Resp 17   Ht  (1.753 m)   Wt 86.2 kg   SpO2 100%   BMI 28.06 kg/m     Procedures  Procedures  ED Course / MDM    Medical Decision Making Amount and/or Complexity of Data Reviewed Labs: ordered. Radiology: ordered.  Risk Prescription drug management. Decision regarding hospitalization.   52 year old male with medical history significant for diverticulitis presenting to the emergency department with abdominal pain and nausea, found to have sigmoid diverticulitis that is uncomplicated on CT imaging.  Initial IV antibiotics have been ordered however the patient would prefer to trial outpatient management.  Plan to finish IV antibiotics in the ER and discharged with attempt at management outpatient with oral antibiotics.  On repeat evaluation, the patient's heart rate was dipping up into the mid 90s, has a leukocytosis to 18.6.  The patient's Dilaudid was wearing off and he endorsed significant issues with pain control.  I offered admission for observation in the setting of his diverticulitis given his history of recurrent episodes of diverticulitis, concern for developing sepsis.  Patient consented to admission, hospitalist consulted for admission, Dr. Natale Milch accepting.       Ernie Avena, MD 09/22/22 1705

## 2022-09-22 NOTE — Discharge Instructions (Addendum)
You have been seen and discharged from the emergency department.  You have been diagnosed with diverticulitis.  You were given a dose of IV antibiotics.  You will be discharged with antibiotics and pain medicine.  Follow-up with your primary provider and GI doctor for further evaluation and further care. Take home medications as prescribed. If you have any worsening symptoms or further concerns for your health please return to an emergency department for further evaluation.

## 2022-09-22 NOTE — Progress Notes (Signed)
PHARMACY NOTE:  ANTIMICROBIAL RENAL DOSAGE ADJUSTMENT  Current antimicrobial regimen includes a mismatch between antimicrobial dosage and estimated renal function.  As per policy approved by the Pharmacy & Therapeutics and Medical Executive Committees, the antimicrobial dosage will be adjusted accordingly.  Current antimicrobial dosage:  Cipro  IV q24h  Indication: intra-abdominal infection  Renal Function:  Estimated Creatinine Clearance: 82.2 mL/min (by C-G formula based on SCr of 1.13 mg/dL).      Antimicrobial dosage has been changed to:  Cipro  IV q12h  Thank you for allowing pharmacy to be a part of this patient's care.  Maryellen Pile, [facility] 09/22/2022 10:00 PM

## 2022-09-22 NOTE — H&P (Signed)
History and Physical  Benjamin [name] ZOX:[id number] DOB: August 03, 1970 DOA: 09/22/2022  PCP: Ofilia Neas, PA-C   Chief Complaint: abd pain   HPI: Benjamin [name] is a 52 y.o. 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  He has been in his usual state of health, but was awoken from sleep today at 2 AM with a sharp stabbing left lower quadrant abdominal pain.  He went to the ER for evaluation.  No associated fevers, some associated nausea.  Feels similar to his prior diverticulitis.  Denies any cough, chest pain.  Had a normal bowel movement this morning.  ED Course: He was evaluated in the ER, where he was noted to have leukocytosis, and was tachycardic.  CT scan of the abdomen as noted below.  He was given IV ciprofloxacin, IV Flagyl, and admitted to the hospitalist service due to continued severe pain, nausea.  Currently he is having some left lower quadrant and upper pelvic pain, with nausea.  Review of Systems: Please see HPI for pertinent positives and negatives. A complete 10 system review of systems are otherwise negative.  Past Medical History:  Diagnosis Date   Acute DVT of left tibial vein 04/24/2017   Xarelto started (dx'd in emergency dept).  Dr. Myna Hidalgo recommended full dose xarelto x 93mo, with an additional year of maintenance dose xarelto (as of 05/2017 initial consult note).  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  Pt Factor V leiden heterozygote.  Stable as of 09/2017 hem f/u.   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  Pt referred for sleep study.   Chronic renal insufficiency, stage II (mild)    GFR 60s-70s (suspect HTN and NSAID damage as etiology).  GFR dipped into 50s fall 2018 after lots of NSAID use for chronic daily HA's.   Diverticulitis 2016   CT 01/2015   Frequent headaches    Gastric polyp 2006   GERD without esophagitis    Herpes labialis    Hiatal hernia    Hyperlipidemia    HYPERTENSION 01/05/2007   OSA (obstructive sleep apnea) 04/2017   Dr. Frances Furbish eval 04/2017-plan for sleep study.   PSORIASIS 01/05/2007   Past Surgical History:  Procedure Laterality Date   COLONOSCOPY W/ POLYPECTOMY  10/04/15   7mm tubular adenoma + diverticulosis: recall 5 yrs (Dr. Russella Dar)   ESOPHAGOGASTRODUODENOSCOPY  04/12/14   Barrett's esophagus   LIPOMA EXCISION     several, arm    Social History:  reports that he has never smoked. He has never used smokeless tobacco. He reports current alcohol use of about 2.0 standard drinks of alcohol per week. He reports that he does not use drugs.   Allergies  Allergen Reactions   Bismuth Subsalicylate Swelling   Compazine [Prochlorperazine Edisylate] Other (See Comments)    Cervical dystonia and  panic attack    Family History  Problem Relation Age of Onset   CAD Father        around age 46   Heart disease Father    Hyperlipidemia Father    Hypertension Father    CAD Paternal Grandmother    Diabetes Paternal Grandmother    Hypertension Mother    Hypertension Brother    Hyperlipidemia Brother    Lung cancer Maternal Grandmother    Cancer Maternal Grandfather        type unknown   Colon cancer Other        neg hx   Prostate cancer Other        neg hx     Prior to Admission medications    Medication Sig Start Date End Date Taking? Authorizing Provider  acetaminophen (TYLENOL) 325 MG tablet Take 650 mg by mouth every 6 (six) hours as needed for mild pain or headache.    [provider]  buPROPion (WELLBUTRIN XL) 300 MG 24 hr tablet Take 1 tablet (300 mg total) by mouth daily. 01/25/18   Ofilia Neas, PA-C  diltiazem (CARDIZEM CD) 240 MG 24 hr capsule TAKE 1 CAPSULE BY MOUTH EVERY DAY 02/19/21   Patwardhan, Manish J, MD  esomeprazole (NEXIUM) 20 MG capsule Take 20 mg by mouth daily at 12 noon.    [provider]  losartan (COZAAR) 25 MG tablet Take 2 tablets (50 mg total) by mouth daily. Patient taking differently: Take 25 mg by mouth daily. 10/12/19 12/08/28  Margie Ege A, DO  Multiple Vitamin (MULTIVITAMIN WITH MINERALS) TABS tablet Take 1 tablet by mouth daily.    [provider]  ondansetron (ZOFRAN) 4 MG tablet Take 1 tablet (4 mg total) by mouth every 8 (eight) hours as needed for nausea or vomiting. 06/24/20   Haskel Schroeder, PA-C  rosuvastatin (CRESTOR) 40 MG tablet Take 1 tablet (40 mg total) by mouth once a week. Patient taking differently: Take 40 mg by mouth every Friday. 12/27/17   Ofilia Neas, PA-C  tamsulosin (FLOMAX) 0.4 MG CAPS capsule Take 1 capsule (0.4 mg total) by mouth daily. 04/05/18   Benjiman Core D, PA-C  valACYclovir (VALTREX) 500 MG tablet Take 1 tablet (500 mg total) by mouth daily. 12/30/17   Ofilia Neas, PA-C  lisinopril-hydrochlorothiazide (PRINZIDE,ZESTORETIC) 20-25 MG per tablet Take 1 tablet by mouth daily. 07/24/11 08/21/11  Gordy Savers, MD    Physical Exam: BP (!) 158/117 (BP Location: Right Arm) Comment: patient hasn't taken BP meds today  Pulse 92   Temp 98.4 F (36.9 C) (Oral)   Resp 16   Ht  (1.753 m)   Wt 86.2 kg   SpO2 99%   BMI 28.06 kg/m   General: Well-nourished well-developed gentleman, pleasant and cooperative, but obviously somewhat uncomfortable.  Wife at the  bedside. Eyes: EOMI, clear conjuctivae, white sclerea Neck: supple, no masses, trachea mildline  Cardiovascular: RRR, no murmurs or rubs, no peripheral edema  Respiratory: clear to auscultation bilaterally, no wheezes, no crackles  Abdomen: soft, tender in the bilateral lower quadrants,, nondistended, normal bowel tones heard, some voluntary guarding but no rebound tenderness Skin: dry, no rashes  Musculoskeletal: no joint effusions, normal range of motion  Psychiatric: appropriate affect, normal speech  Neurologic: extraocular muscles intact, clear speech, moving all extremities with intact sensorium          Labs on Admission:  Basic Metabolic Panel: Recent Labs  Lab 09/22/22 1145  NA  137  K 3.6  CL 101  CO2 26  GLUCOSE 96  BUN 13  CREATININE 1.13  CALCIUM 10.1   Liver Function Tests: Recent Labs  Lab 09/22/22 1145  AST 17  ALT 16  ALKPHOS 77  BILITOT 1.3*  PROT 8.1  ALBUMIN 4.8   Recent Labs  Lab 09/22/22 1145  LIPASE <10*   No results for input(s): "AMMONIA" in the last 168 hours. CBC: Recent Labs  Lab 09/22/22 1145  WBC 18.6*  HGB 15.8  HCT 45.7  MCV 89.1  PLT 409*   Cardiac Enzymes: No results for input(s): "CKTOTAL", "CKMB", "CKMBINDEX", "TROPONINI" in the last 168 hours.  BNP (last 3 results) No results for input(s): "BNP" in the last 8760 hours.  ProBNP (last 3 results) No results for input(s): "PROBNP" in the last 8760 hours.  CBG: No results for input(s): "GLUCAP" in the last 168 hours.  Radiological Exams on Admission: CT ABDOMEN PELVIS W CONTRAST  Result Date: 09/22/2022 CLINICAL DATA:  Abdominal pain, acute, nonlocalized left lower quadrant and low abdominal pain that began early this morning. Low-grade fever and nausea. EXAM: CT ABDOMEN AND PELVIS WITH CONTRAST TECHNIQUE: Multidetector CT imaging of the abdomen and pelvis was performed using the standard protocol following bolus administration of intravenous contrast. RADIATION DOSE  REDUCTION: This exam was performed according to the departmental dose-optimization program which includes automated exposure control, adjustment of the mA and/or kV according to patient size and/or use of iterative reconstruction technique. CONTRAST:  80mL OMNIPAQUE IOHEXOL 300 MG/ML  SOLN COMPARISON:  Abdominopelvic CT 06/24/2020 and 12/09/2019. FINDINGS: Lower chest: Clear lung bases. No significant pleural or pericardial effusion. Hepatobiliary: The liver is normal in density without suspicious focal abnormality. No evidence of gallstones, gallbladder wall thickening or biliary dilatation. Pancreas: Unremarkable. No pancreatic ductal dilatation or surrounding inflammatory changes. Spleen: Normal in size without focal abnormality. Adrenals/Urinary Tract: Both adrenal glands appear normal. Punctate nonobstructing bilateral renal calculi. No evidence of ureteral calculus, hydronephrosis or perinephric soft tissue stranding. The bladder appears unremarkable for its degree of distention. Stomach/Bowel: No enteric contrast administered. The stomach appears unremarkable for its degree of distention. There is a diverticulum of the 3rd portion of the duodenum. The small bowel, appendix and proximal colon otherwise appear unremarkable. There is scattered distal colonic diverticulosis with recurrent long segment wall thickening of the sigmoid colon with surrounding inflammation consistent with recurrent acute diverticulitis. Location is similar to the previous study. No focal surrounding fluid collection or definite extraluminal air. Vascular/Lymphatic: There are no enlarged abdominal or pelvic lymph nodes. Aortic and branch vessel atherosclerosis without evidence of aneurysm or large vessel occlusion. Reproductive: The prostate gland and seminal vesicles appear normal. Other: Small umbilical hernia containing only fat. There is a small amount of free pelvic fluid. No suspicious peritoneal enhancement or pneumoperitoneum.  Musculoskeletal: No acute or significant osseous findings. IMPRESSION: 1. Recurrent acute sigmoid diverticulitis with long segment wall thickening, surrounding inflammation and a small amount of free pelvic fluid. No evidence of bowel perforation, obstruction or abscess. 2. Punctate nonobstructing bilateral renal calculi. 3.  Aortic Atherosclerosis (ICD10-I70.0). Electronically Signed   By: Carey Bullocks M.D.   On: 09/22/2022 14:00    Assessment/Plan Principal Problem: Sepsis-meeting criteria with tachycardia, leukocytosis, source is diverticulitis.  He is hemodynamically stable. -Inpatient MedSurg admission -Continue empiric IV ciprofloxacin and Flagyl -Regular diet as tolerated -Normal saline infusion -Check lactate Essential hypertension-continue home Cardizem, first dose now, and losartan first dose in the morning  DVT prophylaxis: Lovenox  Code Status: Full Code  Consults called: None  Admission status: The appropriate patient status for this patient is INPATIENT. Inpatient status is judged to be reasonable and necessary in order to provide the required intensity of service to ensure the patient's safety. The patient's presenting symptoms, physical exam findings, and initial radiographic and laboratory data in the context of their chronic comorbidities is felt to place them at high risk for further clinical deterioration. Furthermore, it is not anticipated that the patient will be medically stable for discharge from the hospital within 2 midnights of admission.    I certify that at the point of admission it is my clinical judgment that the patient will require inpatient hospital care spanning beyond 2 midnights from the point of admission due to high intensity of service, high risk for further deterioration and high frequency of surveillance required   Time spent: 56 minutes  Hazelyn Kallen Sharlette Dense MD [facility] Pager [phone]  If 7PM-7AM, please contact  night-coverage [email or web address] Password [facility]  09/22/2022, 9:33 PM

## 2022-09-23 DIAGNOSIS — Z86718 Personal history of other venous thrombosis and embolism: Secondary | ICD-10-CM

## 2022-09-23 DIAGNOSIS — Z8719 Personal history of other diseases of the digestive system: Secondary | ICD-10-CM | POA: Diagnosis not present

## 2022-09-23 DIAGNOSIS — K5732 Diverticulitis of large intestine without perforation or abscess without bleeding: Secondary | ICD-10-CM | POA: Diagnosis not present

## 2022-09-23 DIAGNOSIS — A419 Sepsis, unspecified organism: Secondary | ICD-10-CM | POA: Diagnosis not present

## 2022-09-23 DIAGNOSIS — Z86010 Personal history of colonic polyps: Secondary | ICD-10-CM | POA: Diagnosis not present

## 2022-09-23 LAB — CBC
HCT: 44.1 % (ref 39.0–52.0)
Hemoglobin: 15.2 g/dL (ref 13.0–17.0)
MCH: 31 pg (ref 26.0–34.0)
MCHC: 34.5 g/dL (ref 30.0–36.0)
MCV: 90 fL (ref 80.0–100.0)
Platelets: 332 10*3/uL (ref 150–400)
RBC: 4.9 MIL/uL (ref 4.22–5.81)
RDW: 12.3 % (ref 11.5–15.5)
WBC: 19.3 10*3/uL — ABNORMAL HIGH (ref 4.0–10.5)
nRBC: 0 % (ref 0.0–0.2)

## 2022-09-23 LAB — HIV ANTIBODY (ROUTINE TESTING W REFLEX): HIV Screen 4th Generation wRfx: NONREACTIVE

## 2022-09-23 LAB — BASIC METABOLIC PANEL
Anion gap: 10 (ref 5–15)
BUN: 11 mg/dL (ref 6–20)
CO2: 23 mmol/L (ref 22–32)
Calcium: 8.9 mg/dL (ref 8.9–10.3)
Chloride: 100 mmol/L (ref 98–111)
Creatinine, Ser: 0.99 mg/dL (ref 0.61–1.24)
GFR, Estimated: >60 mL/min (ref >60)
Glucose, Bld: 114 mg/dL — ABNORMAL HIGH (ref 70–99)
Potassium: 3.7 mmol/L (ref 3.5–5.1)
Sodium: 133 mmol/L — ABNORMAL LOW (ref 135–145)

## 2022-09-23 LAB — LACTIC ACID, PLASMA: Lactic Acid, Venous: 0.9 mmol/L (ref 0.5–1.9)

## 2022-09-23 MED ORDER — SODIUM CHLORIDE 0.9 % IV SOLN: INTRAVENOUS | Status: DC

## 2022-09-23 MED ORDER — HYDRALAZINE HCL 20 MG/ML IJ SOLN: 10.0000 mg | Freq: Three times a day (TID) | INTRAMUSCULAR | Status: DC | PRN

## 2022-09-23 MED ORDER — HYDROCODONE-ACETAMINOPHEN 5-325 MG PO TABS
1.0000 | ORAL_TABLET | ORAL | Status: DC | PRN
  Administered 2022-09-23 – 2022-09-26 (×10): 2 via ORAL
  Filled 2022-09-23 (×13): qty 2

## 2022-09-23 MED ORDER — SENNOSIDES-DOCUSATE SODIUM 8.6-50 MG PO TABS
1.0000 | ORAL_TABLET | Freq: Two times a day (BID) | ORAL | Status: DC
  Administered 2022-09-23 – 2022-09-26 (×7): 1 via ORAL
  Filled 2022-09-23 (×7): qty 1

## 2022-09-23 MED ORDER — HYDROCODONE-ACETAMINOPHEN 5-325 MG PO TABS
1.0000 | ORAL_TABLET | Freq: Four times a day (QID) | ORAL | Status: DC | PRN
  Administered 2022-09-23: 1 via ORAL
  Filled 2022-09-23: qty 1

## 2022-09-23 MED ORDER — PANTOPRAZOLE SODIUM 40 MG PO TBEC
40.0000 mg | DELAYED_RELEASE_TABLET | Freq: Every day | ORAL | Status: DC
  Administered 2022-09-23: 40 mg via ORAL
  Filled 2022-09-23 (×2): qty 1

## 2022-09-23 MED ORDER — POLYETHYLENE GLYCOL 3350 17 G PO PACK
17.0000 g | PACK | Freq: Every day | ORAL | Status: DC
  Administered 2022-09-23 – 2022-09-26 (×4): 17 g via ORAL
  Filled 2022-09-23 (×4): qty 1

## 2022-09-23 NOTE — Progress Notes (Signed)
52 year old male admitted for diverticulitis and abdominal pain. VS are T 98.4 BP 137/102 P 89 O2 98 R17.  Pt has had a migraine all day and requested hydrocodone and was prescribed hydrocodone-acetaminophen 5-325mg  every 6 hours PRN. Pt is alert and oriented x4. Follows commands and has clear speech. S1 and S2 sounds are present with no murmurs present. Clear lung sounds bilaterally and bowel sounds are active in all 4 quadrants. Capillary refill is less than 3 seconds.   Barnett Abu, RN Student

## 2022-09-23 NOTE — Consult Note (Addendum)
Referring Provider: Dr. Andreas Newport Primary Care Physician:  Ofilia Neas, PA-C Primary Gastroenterologist:  Dr. Russella Dar   Reason for Consultation:  Diverticulitis   HPI: Benjamin [name] is a 52 y.o. male with a past medical history of hypertension, hyperlipidemia, obstructive sleep apnea, Leiden factor V deficiency, DVT (not on anticoagulation), GERD, Barrett's esophagus, cecal tubular adenomatous polyp and recurrent sigmoid diverticulitis  He developed mild LLQ pain on 09/18/2022 and he took Augmentin 1 tab bid for 1 1/2 days. He keeps a supply of Augmentin as previously prescribed by his PCP and he takes it as needed. His LLQ pain improved  1 1/2 days later so he stopped taking it. However, he awakened Tuesday 4/23 in the early am with RLQ and central lower abdominal pain and he was concerned he had diverticulitis so he went to [facility] 09/22/2022. Labs in the ED showed a WBC count of 18.6.  Hemoglobin 15.8.  Platelet 409.  BUN 13.  Creatinine 1.13.  Total bili 1.3.  Alk phos 77.  AST 17.  ALT 16.  Lactic acid 0.9.  CTAP with contrast identified acute sigmoid diverticulitis with a long segment of wall thickening and surrounding inflammation with a small amount of free pelvic fluid without evidence of perforation or abscess. He was started on IV Cipro and Flagyl and transferred to [facility] for admission.  A GI consult was requested by the patient and family.   He is concerned regarding his level of abdominal pain in the setting of recurrent diverticulitis.  He last received Hydrocodone 5 mg at noon and Dilaudid 1 mg IV at 1:30 PM without symptom relief.  His mother and wife are at the bedside and share the same concern.  He denies having any nausea or vomiting.  No fevers or sweats at home. He has intermittent constipation and infrequently takes MiraLAX.  He typically passes a normal brown bowel movement most days.  No rectal bleeding or black stools.  He underwent a  colonoscopy 10/04/2015 which showed moderate diverticulosis in the sigmoid and descending colon and one 7 mm tubular adenomatous polyp was removed from the cecum.  He was advised to repeat a colonoscopy in 5 years which was not done.  He has a history of GERD and Barrett's esophagus.  Takes Nexium OTC 20 mg 2 capsules once daily.  He has heartburn several days weekly.  No dysphagia.  He underwent an EGD 04/12/2014 which confirmed Barrett's esophagus.  He was advised to repeat an EGD in 3 years which was not done. He takes ibuprofen 200 mg 2 to 3 tablets a few days weekly and Goody powder 1 packet once daily for headaches and generalized body pain.  No known family history of esophageal, gastric or colorectal cancer.  He has a history of recurrent diverticulitis, his first episode was in 2016 which required hospitalization for an abscess without perforation.  Since then, he was diagnosed with sigmoid diverticulitis per CT imaging x 4 from 01/2018 - 06/2020.  However, he intermittently takes a course of Augmentin for presumed diverticulitis when he develops LLQ pain as prescribed by his PCP.    PRIOR IMAGE STUDIES:  CT AP 06/24/2020:  Sigmoid and descending colonic diverticulosis. Inflammatory stranding around the proximal sigmoid colon compatible with active diverticulitis. Stomach and small bowel decompressed. Appendix is normal. Changes of proximal sigmoid diverticulitis. No complicating feature.   CTAP 12/09/2019: 1. Thickening of the walls of a focal segment of the mid descending colon, with surrounding  fluid/inflammation, most likely acute uncomplicated diverticulitis, less likely localized colitis of infectious, inflammatory or ischemic nature. No abscess collection seen. No free intraperitoneal air. No associated bowel obstruction. 2. Additional sigmoid colon diverticulosis.  CTAP 10/09/2019: Acute diverticulitis of the mid descending colon. There is a small ill-defined fluid collection along  the anterior margin of the mid descending colon measuring up to 2.8 cm without a well-defined enhancing rim. Findings are favored to represent phlegmon. No extraluminal air/pneumoperitoneum.  CTAP 02/08/2018: Mild-to-moderate sigmoid diverticulitis. No evidence of abscess or other complication.  CTAP 01/31/19/2016: 1. Severe acute inflammation involving the proximal sigmoid colon, with inflammatory wall thickening involving the remainder of the sigmoid colon. As the patient has diverticulosis in the descending colon, diverticulitis is favored over focal colitis. 2. Approximate 1.2 cm intramural abscess involving the proximal sigmoid colon. No evidence of perforation. 3. Mild aortoiliac atherosclerosis which is somewhat advanced for age.  GI PROCEDURES:  Colonoscopy 10/04/2015: - One 7 mm polyp in the cecum, removed with a cold snare. Resected and retrieved.  - Moderate diverticulosis in the sigmoid colon and in the descending colon. -Recall colonoscopy 5 years Surgical [P], cecum, polyp - TUBULAR ADENOMA(X1). - HIGH GRADE DYSPLASIA IS NOT IDENTIFIED.  EGD 04/12/2014: 1. z-line was variable, located 40cm from the incisors  2. Small hiatal hernia  3. The EGD was otherwise normal. -Recall EGD 3 years - INTESTINAL METAPLASIA (GOBLET CELL METAPLASIA) CONSISTENT WITH BARRETT'S ESOPHAGUS. NO DYSPLASIA OR MALIGNANCY IDENTIFIED.  EGD 02/20/2005: 2 cm hiatal hernia Mucosal nodule in the gastric body STOMACH, BIOPSY, GASTRIC BODY NODULE:   - FINDINGS CONSISTENT WITH FUNDIC GLAND POLYP.   - SPARSE CHRONIC INFLAMMATION.   - NO HELICOBACTER PYLORI, INTESTINAL METAPLASIA, OR   EVIDENCE OF MALIGNANCY    Past Medical History:  Diagnosis Date   Acute DVT of left tibial vein 04/24/2017   Xarelto started (dx'd in emergency dept).  Dr. Myna Hidalgo recommended full dose xarelto x 61mo, with an additional year of maintenance dose xarelto (as of 05/2017 initial consult note).  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  Pt Factor V leiden heterozygote.  Stable as of 09/2017 hem f/u.   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  Pt referred for sleep study.   Chronic renal insufficiency, stage II (mild)    GFR 60s-70s (suspect HTN and NSAID damage as etiology).  GFR dipped into 50s fall 2018 after lots of NSAID use for chronic daily HA's.   Diverticulitis 2016   CT 01/2015   Frequent headaches    Gastric polyp 2006   GERD without esophagitis    Herpes labialis    Hiatal hernia    Hyperlipidemia    HYPERTENSION 01/05/2007   OSA (obstructive sleep apnea) 04/2017   Dr. Frances Furbish eval 04/2017-plan for sleep study.   PSORIASIS 01/05/2007    Past Surgical History:  Procedure Laterality Date   COLONOSCOPY W/ POLYPECTOMY  10/04/15   7mm tubular adenoma + diverticulosis: recall 5 yrs (Dr. Russella Dar)   ESOPHAGOGASTRODUODENOSCOPY  04/12/14   Barrett's esophagus   LIPOMA EXCISION     several, arm    Prior to Admission medications   Medication Sig Start Date End Date Taking? Authorizing Provider  acetaminophen (TYLENOL) 500 MG tablet Take 1,000 mg by mouth as needed for moderate pain.   Yes [provider]  buPROPion (WELLBUTRIN XL) 150 MG 24 hr tablet Take 450 mg by mouth daily.   Yes [provider]  diltiazem (CARDIZEM CD) 240 MG 24 hr capsule TAKE 1 CAPSULE BY MOUTH EVERY DAY Patient taking  differently: Take 240 mg by mouth daily. 02/19/21  Yes Patwardhan, Manish J, MD  esomeprazole (NEXIUM) 20 MG capsule Take 40 mg by mouth daily at 12 noon.   Yes [provider]  fluconazole (DIFLUCAN) 150 MG tablet Take 150 mg by mouth once a week.   Yes [provider]  ibuprofen (ADVIL) 200 MG tablet Take 400 mg by mouth as needed for moderate pain.   Yes [provider]  losartan (COZAAR) 25 MG tablet Take 2 tablets (50 mg total) by mouth daily. Patient taking differently: Take 25 mg by mouth daily. 10/12/19 12/08/28 Yes Kyle, Tyrone A, DO  ondansetron (ZOFRAN) 4 MG tablet Take 1 tablet (4 mg total) by mouth every 8 (eight) hours as needed for nausea or vomiting. 06/24/20  Yes Haskel Schroeder, PA-C  rosuvastatin (CRESTOR) 40 MG tablet Take 1 tablet (40 mg total) by mouth once a week. Patient taking differently: Take 40 mg by mouth every Friday. 12/27/17  Yes Ofilia Neas, PA-C  tamsulosin (FLOMAX) 0.4 MG CAPS capsule Take 1 capsule (0.4 mg total) by mouth daily. 04/05/18  Yes Barnett Abu, [place] D, PA-C  valACYclovir (VALTREX) 500 MG tablet Take 1 tablet (500 mg total) by mouth daily. 12/30/17  Yes Ofilia Neas, PA-C  buPROPion (WELLBUTRIN XL) 300 MG 24 hr tablet Take 1 tablet (300 mg total) by mouth daily. Patient not taking: Reported on 09/23/2022 01/25/18   Ofilia Neas, PA-C  lisinopril-hydrochlorothiazide (PRINZIDE,ZESTORETIC) 20-25 MG per tablet Take 1 tablet by mouth daily. 07/24/11 08/21/11  Gordy Savers, MD    Current Facility-Administered Medications  Medication Dose Route Frequency Provider Last Rate Last Admin   0.9 % NaCl with KCl 20 mEq/ L  infusion   Intravenous Continuous Kirby Crigler, Mir M, MD 75 mL/hr at 09/22/22 2212 New Bag at 09/22/22 2212   acetaminophen (TYLENOL) tablet 650 mg  650 mg Oral Q6H PRN Maryln Gottron, MD   650 mg at 09/23/22 1610   Or   acetaminophen (TYLENOL) suppository 650 mg  650 mg Rectal Q6H PRN Kirby Crigler, Mir  M, MD       albuterol (PROVENTIL) (2.5 MG/3ML) 0.083% nebulizer solution 2.5 mg  2.5 mg Nebulization Q2H PRN Kirby Crigler, Mir M, MD       ciprofloxacin (CIPRO) IVPB 400 mg  400 mg Intravenous Q12H Kirby Crigler, Mir M, MD 200 mL/hr at 09/23/22 0442 400 mg at 09/23/22 0442   diltiazem (CARDIZEM CD) 24 hr capsule 240 mg  240 mg Oral Daily Kirby Crigler, Mir M, MD   240 mg at 09/23/22 0936   enoxaparin (LOVENOX) injection 40 mg  40 mg Subcutaneous Q24H Kirby Crigler, Mir M, MD   40 mg at 09/22/22 2208   HYDROcodone-acetaminophen (NORCO/VICODIN) 5-325 MG per tablet 1 tablet  1 tablet Oral Q6H PRN Briant Cedar, MD   1 tablet  at 09/23/22 1153   HYDROmorphone (DILAUDID) injection 0.5-1 mg  0.5-1 mg Intravenous Q3H PRN Kirby Crigler, Mir M, MD   1 mg at 09/23/22 1336   losartan (COZAAR) tablet 50 mg  50 mg Oral Daily Kirby Crigler, Mir M, MD   50 mg at 09/23/22 0936   metoprolol tartrate (LOPRESSOR) injection 5 mg  5 mg Intravenous Q6H PRN Kirby Crigler, Mir M, MD       metroNIDAZOLE (FLAGYL) IVPB 500 mg  500 mg Intravenous Q12H Kirby Crigler, Mir M, MD 100 mL/hr at 09/23/22 0615 500 mg at 09/23/22 0615   ondansetron (ZOFRAN) tablet 4 mg  4 mg Oral Q6H PRN Kirby Crigler, Mir M, MD       Or   ondansetron [facility]) injection 4 mg  4 mg Intravenous Q6H PRN Kirby Crigler, Mir M, MD   4 mg at 09/23/22 1610   polyethylene glycol (MIRALAX / GLYCOLAX) packet 17 g  17 g Oral Daily Briant Cedar, MD   17 g at 09/23/22 1155   senna-docusate (Senokot-S) tablet 1 tablet  1 tablet Oral BID Briant Cedar, MD   1 tablet at 09/23/22 1153   traZODone (DESYREL) tablet 25 mg  25 mg Oral QHS PRN Kirby Crigler, Mir M, MD       valACYclovir (VALTREX) tablet 500 mg  500 mg Oral Daily Kirby Crigler, Mir M, MD   500 mg at 09/23/22 0945   Facility-Administered Medications Ordered in Other Encounters  Medication Dose Route Frequency Provider Last Rate Last Admin   gadopentetate dimeglumine (MAGNEVIST) injection 18 mL  18 mL Intravenous Once PRN  Anson Fret, MD        Allergies as of 09/22/2022 - Review Complete 09/22/2022  Allergen Reaction Noted   Bismuth subsalicylate Swelling 08/09/2015   Compazine [prochlorperazine edisylate] Other (See Comments) 03/24/2017    Family History  Problem Relation Age of Onset   CAD Father        around age 45   Heart disease Father    Hyperlipidemia Father    Hypertension Father    CAD Paternal Grandmother    Diabetes Paternal Grandmother    Hypertension Mother    Hypertension Brother    Hyperlipidemia Brother    Lung cancer Maternal Grandmother    Cancer Maternal Grandfather        type unknown   Colon cancer Other        neg hx   Prostate cancer Other        neg hx    Social History   Socioeconomic History   Marital status: Single    Spouse name: Not on file   Number of children: 3   Years of education: Not on file   Highest education level: Not on file  Occupational History   Occupation: regional Mudlogger    Comment: Industrial/product designer  Tobacco Use   Smoking status: Never   Smokeless tobacco: Never  Vaping Use   Vaping Use: Never used  Substance and Sexual Activity   Alcohol use: Yes    Alcohol/week: 2.0 standard drinks of alcohol    Types: 2 Cans of beer per week    Comment: rare   Drug use: No   Sexual activity: Yes    Birth control/protection: None  Other Topics Concern   Not on file  Social History Narrative   Currently separated as of 01/2014, 2 children.  One younger brother.   Occupation: Airline pilot for [employer]   No tobacco.   Occ alcohol (1-2  x/month).  No hx of alc/drug prob.   Exercise: runs about 2 times a week. Not as much lately due to medical state   Diet: regular american diet.   Right handed   1 cup coffee daily      Social Determinants of Health   Financial Resource Strain: Not on file  Food Insecurity: No Food Insecurity (09/22/2022)   Hunger Vital Sign    Worried About Running Out of Food in the Last Year: Never true     Ran Out of Food in the Last Year: Never true  Transportation Needs: No Transportation Needs (09/22/2022)   PRAPARE - Administrator, Civil Service (Medical): No    Lack of Transportation (Non-Medical): No  Physical Activity: Not on file  Stress: Not on file  Social Connections: Not on file  Intimate Partner Violence: Not At Risk (09/22/2022)   Humiliation, Afraid, Rape, and Kick questionnaire    Fear of Current or Ex-Partner: No    Emotionally Abused: No    Physically Abused: No    Sexually Abused: No    Review of Systems: Gen: Denies fever, sweats or chills. No weight loss.  CV: Denies chest pain, palpitations or edema. Resp: Denies cough, shortness of breath of hemoptysis.  GI: See HPI. GU : Denies urinary burning, blood in urine, increased urinary frequency or incontinence. MS: Denies joint pain, muscles aches or weakness. Derm: + Psoriasis. Denies rash, itchiness, skin lesions or unhealing ulcers. Psych: Denies depression, anxiety, memory loss or confusion. Heme: Denies easy bruising, bleeding. Neuro:  Denies headaches, dizziness or paresthesias. Endo:  Denies any problems with DM, thyroid or adrenal function.  Physical Exam: Vital signs in last 24 hours: Temp:  [97.5 F (36.4 C)-98.5 F (36.9 C)] 98.4 F (36.9 C) (04/24 1234) Pulse Rate:  [83-95] 89 (04/24 1234) Resp:  [16-20] 17 (04/24 1234) BP: (129-164)/(91-117) 137/102 (04/24 1234) SpO2:  [92 %-100 %] 98 % (04/24 1234) Last BM Date : 09/22/22 General: Alert 52 year old male in no acute distress. Head:  Normocephalic and atraumatic. Eyes:  No scleral icterus. Conjunctiva pink. Ears:  Normal auditory acuity. Nose:  No deformity, discharge or lesions. Mouth:  Dentition intact. No ulcers or lesions.  Neck:  Supple. No lymphadenopathy or thyromegaly.  Lungs: Breath sounds clear throughout. No wheezes, rhonchi or crackles.  Heart: Regular rate and rhythm, no murmurs. Abdomen: Soft, moderate tenderness to  the RLQ, central lower abdomen and LLQ without rebound or guarding.  Negative shake tenderness. Rectal: Deferred. Musculoskeletal:  Symmetrical without gross deformities.  Pulses:  Normal pulses noted. Extremities:  Without clubbing or edema. Neurologic:  Alert and  oriented x 4. No focal deficits.  Skin:  Intact without significant lesions or rashes. Psych:  Alert and cooperative. Normal mood and affect.  Intake/Output from previous day: 04/23 0701 - 04/24 0700 In: 2348.1 [P.O.:840; I.V.:509; IV Piggyback:999] Out: 1000 [Urine:1000] Intake/Output this shift: Total I/O In: 600 [P.O.:600] Out: 700 [Urine:700]  Lab Results: Recent Labs    09/22/22 1145 09/23/22 0448  WBC 18.6* 19.3*  HGB 15.8 15.2  HCT 45.7 44.1  PLT 409* 332   BMET Recent Labs    09/22/22 1145 09/23/22 0448  NA 137 133*  K 3.6 3.7  CL 101 100  CO2 26 23  GLUCOSE 96 114*  BUN 13 11  CREATININE 1.13 0.99  CALCIUM 10.1 8.9   LFT Recent Labs    09/22/22 1145  PROT 8.1  ALBUMIN 4.8  AST 17  ALT  16  ALKPHOS 77  BILITOT 1.3*   PT/INR No results for input(s): "LABPROT", "INR" in the last 72 hours. Hepatitis Panel No results for input(s): "HEPBSAG", "HCVAB", "HEPAIGM", "HEPBIGM" in the last 72 hours.    Studies/Results: CT ABDOMEN PELVIS W CONTRAST  Result Date: 09/22/2022 CLINICAL DATA:  Abdominal pain, acute, nonlocalized left lower quadrant and low abdominal pain that began early this morning. Low-grade fever and nausea. EXAM: CT ABDOMEN AND PELVIS WITH CONTRAST TECHNIQUE: Multidetector CT imaging of the abdomen and pelvis was performed using the standard protocol following bolus administration of intravenous contrast. RADIATION DOSE REDUCTION: This exam was performed according to the departmental dose-optimization program which includes automated exposure control, adjustment of the mA and/or kV according to patient size and/or use of iterative reconstruction technique. CONTRAST:  80mL  OMNIPAQUE IOHEXOL 300 MG/ML  SOLN COMPARISON:  Abdominopelvic CT 06/24/2020 and 12/09/2019. FINDINGS: Lower chest: Clear lung bases. No significant pleural or pericardial effusion. Hepatobiliary: The liver is normal in density without suspicious focal abnormality. No evidence of gallstones, gallbladder wall thickening or biliary dilatation. Pancreas: Unremarkable. No pancreatic ductal dilatation or surrounding inflammatory changes. Spleen: Normal in size without focal abnormality. Adrenals/Urinary Tract: Both adrenal glands appear normal. Punctate nonobstructing bilateral renal calculi. No evidence of ureteral calculus, hydronephrosis or perinephric soft tissue stranding. The bladder appears unremarkable for its degree of distention. Stomach/Bowel: No enteric contrast administered. The stomach appears unremarkable for its degree of distention. There is a diverticulum of the 3rd portion of the duodenum. The small bowel, appendix and proximal colon otherwise appear unremarkable. There is scattered distal colonic diverticulosis with recurrent long segment wall thickening of the sigmoid colon with surrounding inflammation consistent with recurrent acute diverticulitis. Location is similar to the previous study. No focal surrounding fluid collection or definite extraluminal air. Vascular/Lymphatic: There are no enlarged abdominal or pelvic lymph nodes. Aortic and branch vessel atherosclerosis without evidence of aneurysm or large vessel occlusion. Reproductive: The prostate gland and seminal vesicles appear normal. Other: Small umbilical hernia containing only fat. There is a small amount of free pelvic fluid. No suspicious peritoneal enhancement or pneumoperitoneum. Musculoskeletal: No acute or significant osseous findings. IMPRESSION: 1. Recurrent acute sigmoid diverticulitis with long segment wall thickening, surrounding inflammation and a small amount of free pelvic fluid. No evidence of bowel perforation,  obstruction or abscess. 2. Punctate nonobstructing bilateral renal calculi. 3.  Aortic Atherosclerosis (ICD10-I70.0). Electronically Signed   By: Carey Bullocks M.D.   On: 09/22/2022 14:00    IMPRESSION/PLAN:  52 year old male with a history of recurrent sigmoid diverticulitis admitted to the hospital 09/22/2022 with RLQ and LLQ pain. CTAP with contrast identified acute sigmoid diverticulitis with long segment wall thickening and surrounding inflammation with a small amount of free pelvic fluid without evidence of perforation or abscess. WBC 18.6 -> 19.3.  Afebrile.  Hemodynamically stable. -Continue IV Cipro and Flagyl -IV fluids pain management per the hospitalist -Colonoscopy as an outpatient in 8 weeks -Consider colorectal surgery consult as an outpatient -Soft diet as tolerated  -CBC, CMP in a.m. -Repeat CTAP in the next 1 to 2 days if his abdominal pain worsens or if leukocytosis persists  -Await further recommendations per Dr. Leone Payor  History of a 7mm tubular adenomatous cecal polyp per colonoscopy 09/2015.  -Colonoscopy in 8 weeks as noted above.   History of GERD/Barrett's esophagus  -Pantoprazole 40 mg p.o. daily -EGD at time of colonoscopy as outpatient in 8 weeks   Leiden factor V deficiency, history of LLE DVT -  On Lovenox     Arnaldo Natal  09/23/2022, 3:22PM     [facility] Attending   I have taken an interval history, reviewed the chart and examined the patient. I agree with the Advanced Practitioner's note, impression and recommendations.    Also:  1) has severe sxs w/ otherwise uncomplicated diverticulitis - cont Abx, pain meds, outpatient colonoscopy   2) he does not have Barrett's esophagus by new criteria (need > 1 cm columnar change) he did not have that so he does not need an EGD  3) Family wanted to know if he has sepsis - I told them no  Iva Boop, MD, [facility] See Loretha Stapler on call - gastroenterology for best contact  person 09/23/2022 5:16 PM

## 2022-09-23 NOTE — Progress Notes (Addendum)
PROGRESS NOTE  IMAN OROURKE WUJ:[id number] DOB: 10-Apr-1971 DOA: 09/22/2022 PCP: Ofilia Neas, PA-C  HPI/Recap of past 24 hours: UZIAH SORTER is a 52 y.o. 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 Reported a sharp stabbing left lower quadrant abdominal pain. No associated fevers, some associated nausea.  Feels similar to his prior diverticulitis. In the ED, was noted to have leukocytosis, and was tachycardic. CT scan of the abdomen showed recurrent acute sigmoid diverticulitis, no evidence of bowel perforation obstruction or abscess. He was given IV ciprofloxacin, IV Flagyl, and admitted to the hospitalist service for further management    Today, patient continued to complain of now right sided lower quadrant abdominal pain, with some pain on the left side and around the suprapubic region.  Was able to tolerate some soft diet, denied any further nausea or vomiting.  Reports persistent headache/migraines has a history of migraines.  Discussed extensively with patient and mother at bedside.  Requesting GI consultation.   Assessment/Plan: Principal Problem:   Sepsis Active Problems:   Essential hypertension   Diverticulitis   Acute diverticulitis   Sepsis 2/2 acute recurrent sigmoid diverticulitis On presentation, noted to be tachycardic, with leukocytosis Currently afebrile, with leukocytosis CT scan of the abdomen showed recurrent acute sigmoid diverticulitis, no evidence of bowel perforation obstruction or abscess Continue IV Cipro, Flagyl Continue IV fluids, pain management Diet as tolerated GI consulted as per patient's request May need general surgery consultation given recurrent  Hypertension Uncontrolled likely 2/2 pain Continue diltiazem, losartan, hydralazine as needed, metoprolol as needed Pain management  History of  migraines Continues to report persistent headache, ?migraine flare Reports Norco works best for him Continue pain management  History of GERD PPI  History of factor V Leyden deficiency History of LLE DVT Continue Lovenox     Estimated body mass index is 28.06 kg/m as calculated from the following:   Height as of this encounter:  (1.753 m).   Weight as of this encounter: 86.2 kg.     Code Status: Full  Family Communication: Discussed with mother at bedside  Disposition Plan: Status is: Inpatient Remains inpatient appropriate because: Level of care      Consultants: GI as per patient's request  Procedures: None  Antimicrobials: Ciprofloxacin Flagyl  DVT prophylaxis: Lovenox   Objective: Vitals:   09/23/22 0116 09/23/22 0553 09/23/22 0904 09/23/22 1234  BP: (!) 140/99 (!) 129/91 (!) 139/99 (!) 137/102  Pulse: 83 87 88 89  Resp: Temp: (!) 97.5 F (36.4 C) 98 F (36.7 C) 98.2 F (36.8 C) 98.4 F (36.9 C)  TempSrc: Oral Oral Oral Oral  SpO2: 99% 97% 96% 98%  Weight:      Height:        Intake/Output Summary (Last 24 hours) at 09/23/2022 1510 Last data filed at 09/23/2022 1236 Gross per 24 hour  Intake 2445.47 ml  Output 1700 ml  Net 745.47 ml   Filed Weights   09/22/22 1141  Weight: 86.2 kg    Exam: General: NAD  Cardiovascular: S1, S2 present Respiratory: CTAB Abdomen: Soft, tender, nondistended, bowel sounds present Musculoskeletal: No bilateral pedal edema noted Skin: Normal Psychiatry: Normal mood     Data Reviewed: CBC: Recent Labs  Lab 09/22/22 1145 09/23/22 0448  WBC 18.6* 19.3*  HGB 15.8 15.2  HCT 45.7 44.1  MCV 89.1 90.0  PLT 409* 332  Basic Metabolic Panel: Recent Labs  Lab 09/22/22 1145 09/23/22 0448  NA 137 133*  K 3.6 3.7  CL 101 100  CO2 26 23  GLUCOSE 96 114*  BUN 13 11  CREATININE 1.13 0.99  CALCIUM 10.1 8.9   GFR: Estimated Creatinine Clearance: 93.9 mL/min (by C-G formula based  on SCr of 0.99 mg/dL). Liver Function Tests: Recent Labs  Lab 09/22/22 1145  AST 17  ALT 16  ALKPHOS 77  BILITOT 1.3*  PROT 8.1  ALBUMIN 4.8   Recent Labs  Lab 09/22/22 1145  LIPASE <10*   No results for input(s): "AMMONIA" in the last 168 hours. Coagulation Profile: No results for input(s): "INR", "PROTIME" in the last 168 hours. Cardiac Enzymes: No results for input(s): "CKTOTAL", "CKMB", "CKMBINDEX", "TROPONINI" in the last 168 hours. BNP (last 3 results) No results for input(s): "PROBNP" in the last 8760 hours. HbA1C: No results for input(s): "HGBA1C" in the last 72 hours. CBG: No results for input(s): "GLUCAP" in the last 168 hours. Lipid Profile: No results for input(s): "CHOL", "HDL", "LDLCALC", "TRIG", "CHOLHDL", "LDLDIRECT" in the last 72 hours. Thyroid Function Tests: No results for input(s): "TSH", "T4TOTAL", "FREET4", "T3FREE", "THYROIDAB" in the last 72 hours. Anemia Panel: No results for input(s): "VITAMINB12", "FOLATE", "FERRITIN", "TIBC", "IRON", "RETICCTPCT" in the last 72 hours. Urine analysis:    Component Value Date/Time   COLORURINE COLORLESS (A) 09/22/2022 1145   APPEARANCEUR CLEAR 09/22/2022 1145   LABSPEC 1.007 09/22/2022 1145   PHURINE 7.0 09/22/2022 1145   GLUCOSEU NEGATIVE 09/22/2022 1145   GLUCOSEU NEGATIVE 09/02/2015 1443   HGBUR NEGATIVE 09/22/2022 1145   BILIRUBINUR NEGATIVE 09/22/2022 1145   BILIRUBINUR negative 04/05/2018 1102   BILIRUBINUR negative 04/26/2017 1448   KETONESUR NEGATIVE 09/22/2022 1145   PROTEINUR NEGATIVE 09/22/2022 1145   UROBILINOGEN 0.2 04/05/2018 1102   UROBILINOGEN 0.2 09/02/2015 1443   NITRITE NEGATIVE 09/22/2022 1145   LEUKOCYTESUR NEGATIVE 09/22/2022 1145   Sepsis Labs: (procalcitonin:4,lacticidven:4)  )No results found for this or any previous visit (from the past 240 hour(s)).    Studies: No results found.  Scheduled Meds:  diltiazem  240 mg Oral Daily   enoxaparin (LOVENOX)  injection  40 mg Subcutaneous Q24H   losartan  50 mg Oral Daily   polyethylene glycol  17 g Oral Daily   senna-docusate  1 tablet Oral BID   valACYclovir  500 mg Oral Daily    Continuous Infusions:  0.9 % NaCl with KCl 20 mEq / L 75 mL/hr at 09/22/22 2212   ciprofloxacin 400 mg (09/23/22 0442)   metronidazole 500 mg (09/23/22 0615)     LOS: 1 day     Briant Cedar, MD [facility]  If 7PM-7AM, please contact night-coverage [email or web address] 09/23/2022, 3:10 PM

## 2022-09-23 NOTE — TOC CM/SW Note (Signed)
Transition of Care [facility]) Screening Note  Patient Details  Name: Benjamin [name] Date of Birth: [birth year]  Transition of Care [facility]) CM/SW Contact:    Ewing Schlein, LCSW Phone Number: 09/23/2022, 9:54 AM  Transition of Care Department [facility]) has reviewed patient and no TOC needs have been identified at this time. We will continue to monitor patient advancement through interdisciplinary progression rounds. If new patient transition needs arise, please place a TOC consult.

## 2022-09-24 DIAGNOSIS — Z86010 Personal history of colonic polyps: Secondary | ICD-10-CM | POA: Diagnosis not present

## 2022-09-24 DIAGNOSIS — A419 Sepsis, unspecified organism: Secondary | ICD-10-CM | POA: Diagnosis not present

## 2022-09-24 DIAGNOSIS — K5732 Diverticulitis of large intestine without perforation or abscess without bleeding: Secondary | ICD-10-CM | POA: Diagnosis not present

## 2022-09-24 DIAGNOSIS — Z86718 Personal history of other venous thrombosis and embolism: Secondary | ICD-10-CM | POA: Diagnosis not present

## 2022-09-24 DIAGNOSIS — Z8719 Personal history of other diseases of the digestive system: Secondary | ICD-10-CM | POA: Diagnosis not present

## 2022-09-24 LAB — CBC
HCT: 41.8 % (ref 39.0–52.0)
Hemoglobin: 14.1 g/dL (ref 13.0–17.0)
MCH: 30.5 pg (ref 26.0–34.0)
MCHC: 33.7 g/dL (ref 30.0–36.0)
MCV: 90.5 fL (ref 80.0–100.0)
Platelets: 321 10*3/uL (ref 150–400)
RBC: 4.62 MIL/uL (ref 4.22–5.81)
RDW: 12.5 % (ref 11.5–15.5)
WBC: 18.4 10*3/uL — ABNORMAL HIGH (ref 4.0–10.5)
nRBC: 0 % (ref 0.0–0.2)

## 2022-09-24 LAB — BASIC METABOLIC PANEL
Anion gap: 10 (ref 5–15)
BUN: 10 mg/dL (ref 6–20)
CO2: 21 mmol/L — ABNORMAL LOW (ref 22–32)
Calcium: 8.5 mg/dL — ABNORMAL LOW (ref 8.9–10.3)
Chloride: 100 mmol/L (ref 98–111)
Creatinine, Ser: 0.94 mg/dL (ref 0.61–1.24)
GFR, Estimated: >60 mL/min (ref >60)
Glucose, Bld: 123 mg/dL — ABNORMAL HIGH (ref 70–99)
Potassium: 3.4 mmol/L — ABNORMAL LOW (ref 3.5–5.1)
Sodium: 131 mmol/L — ABNORMAL LOW (ref 135–145)

## 2022-09-24 LAB — MAGNESIUM: Magnesium: 2.2 mg/dL (ref 1.7–2.4)

## 2022-09-24 LAB — PROCALCITONIN: Procalcitonin: 0.4 ng/mL

## 2022-09-24 MED ORDER — PIPERACILLIN-TAZOBACTAM 3.375 G IVPB
3.3750 g | Freq: Three times a day (TID) | INTRAVENOUS | Status: DC
  Administered 2022-09-24 – 2022-09-27 (×8): 3.375 g via INTRAVENOUS
  Filled 2022-09-24 (×10): qty 50

## 2022-09-24 MED ORDER — POTASSIUM CHLORIDE CRYS ER 20 MEQ PO TBCR
40.0000 meq | EXTENDED_RELEASE_TABLET | Freq: Once | ORAL | Status: AC
  Administered 2022-09-24: 40 meq via ORAL
  Filled 2022-09-24: qty 2

## 2022-09-24 MED ORDER — ESOMEPRAZOLE MAGNESIUM 20 MG PO CPDR
40.0000 mg | DELAYED_RELEASE_CAPSULE | Freq: Every day | ORAL | Status: DC
  Administered 2022-09-24 – 2022-09-26 (×3): 40 mg via ORAL
  Filled 2022-09-24 (×4): qty 2

## 2022-09-24 NOTE — Progress Notes (Addendum)
[facility] Progress Note  CC:  Diverticulitis  Subjective:  Doing maybe slightly better today.  Tolerating a regular diet.  Passing flatus.  Objective:  Vital signs in last 24 hours: Temp:  [97.7 F (36.5 C)-98.4 F (36.9 C)] 97.7 F (36.5 C) (04/25 0527) Pulse Rate:  [77-89] 77 (04/25 0527) Resp:  [15-20] 15 (04/25 0527) BP: (137-159)/(99-106) 146/99 (04/25 0527) SpO2:  [95 %-98 %] 98 % (04/25 0527) Last BM Date : 09/22/22 General:  Alert, Well-developed, in NAD Heart:  Regular rate and rhythm; no murmurs Pulm:  CTAB.  No W/R/R. Abdomen:  Soft, non-distended.  BS present.  Lower abdominal TTP.  Extremities:  Without edema. Neurologic:  Alert and oriented x 4;  grossly normal neurologically. Psych:  Alert and cooperative. Normal mood and affect.  Intake/Output from previous day: 04/24 0701 - 04/25 0700 In: 4684.4 [P.O.:2040; I.V.:2041.5; IV Piggyback:603] Out: 2150 [Urine:2150]  Lab Results: Recent Labs    09/22/22 1145 09/23/22 0448 09/24/22 0451  WBC 18.6* 19.3* 18.4*  HGB 15.8 15.2 14.1  HCT 45.7 44.1 41.8  PLT 409* 332 321   BMET Recent Labs    09/22/22 1145 09/23/22 0448 09/24/22 0451  NA 137 133* 131*  K 3.6 3.7 3.4*  CL 101 100 100  CO2 26 23 21*  GLUCOSE 96 114* 123*  BUN CREATININE 1.13 0.99 0.94  CALCIUM 10.1 8.9 8.5*   LFT Recent Labs    09/22/22 1145  PROT 8.1  ALBUMIN 4.8  AST 17  ALT 16  ALKPHOS 77  BILITOT 1.3*   CT ABDOMEN PELVIS W CONTRAST  Result Date: 09/22/2022 CLINICAL DATA:  Abdominal pain, acute, nonlocalized left lower quadrant and low abdominal pain that began early this morning. Low-grade fever and nausea. EXAM: CT ABDOMEN AND PELVIS WITH CONTRAST TECHNIQUE: Multidetector CT imaging of the abdomen and pelvis was performed using the standard protocol following bolus administration of intravenous contrast. RADIATION DOSE REDUCTION: This exam was performed according to the departmental  dose-optimization program which includes automated exposure control, adjustment of the mA and/or kV according to patient size and/or use of iterative reconstruction technique. CONTRAST:  80mL OMNIPAQUE IOHEXOL 300 MG/ML  SOLN COMPARISON:  Abdominopelvic CT 06/24/2020 and 12/09/2019. FINDINGS: Lower chest: Clear lung bases. No significant pleural or pericardial effusion. Hepatobiliary: The liver is normal in density without suspicious focal abnormality. No evidence of gallstones, gallbladder wall thickening or biliary dilatation. Pancreas: Unremarkable. No pancreatic ductal dilatation or surrounding inflammatory changes. Spleen: Normal in size without focal abnormality. Adrenals/Urinary Tract: Both adrenal glands appear normal. Punctate nonobstructing bilateral renal calculi. No evidence of ureteral calculus, hydronephrosis or perinephric soft tissue stranding. The bladder appears unremarkable for its degree of distention. Stomach/Bowel: No enteric contrast administered. The stomach appears unremarkable for its degree of distention. There is a diverticulum of the 3rd portion of the duodenum. The small bowel, appendix and proximal colon otherwise appear unremarkable. There is scattered distal colonic diverticulosis with recurrent long segment wall thickening of the sigmoid colon with surrounding inflammation consistent with recurrent acute diverticulitis. Location is similar to the previous study. No focal surrounding fluid collection or definite extraluminal air. Vascular/Lymphatic: There are no enlarged abdominal or pelvic lymph nodes. Aortic and branch vessel atherosclerosis without evidence of aneurysm or large vessel occlusion. Reproductive: The prostate gland and seminal vesicles appear normal. Other: Small umbilical hernia containing only fat. There is a small amount of free pelvic fluid. No suspicious peritoneal enhancement or pneumoperitoneum. Musculoskeletal: No  acute or significant osseous findings.  IMPRESSION: 1. Recurrent acute sigmoid diverticulitis with long segment wall thickening, surrounding inflammation and a small amount of free pelvic fluid. No evidence of bowel perforation, obstruction or abscess. 2. Punctate nonobstructing bilateral renal calculi. 3.  Aortic Atherosclerosis (ICD10-I70.0). Electronically Signed   By: Carey Bullocks M.D.   On: 09/22/2022 14:00    Assessment / Plan: 52 year old male with a history of recurrent sigmoid diverticulitis admitted to the hospital 09/22/2022 with RLQ and LLQ pain. CTAP with contrast identified acute sigmoid diverticulitis with long segment wall thickening and surrounding inflammation with a small amount of free pelvic fluid without evidence of perforation or abscess. WBC 18.6 -> 19.3->18.4 K.  Afebrile.  Hemodynamically stable. -Continue IV Cipro and Flagyl. -IV fluids and pain management per the hospitalist. -Colonoscopy as an outpatient in 8 weeks. -Consider colorectal surgery consult as an outpatient. -On a regular diet.  -Repeat CTAP in the next 1 to 2 days if his abdominal pain worsens or if leukocytosis persists. -Trend CBC for leukocytosis. -Hospitalist has ordered blood cultures today as well.    History of a 7mm tubular adenomatous cecal polyp per colonoscopy 09/2015.  -Colonoscopy in 8 weeks as noted above.    History of GERD/Barrett's esophagus:  Per Dr. Leone Payor, no Barrett's by new criteria (need > 1 cm columnar change) and he did not have that so he does not need an EGD.  -Pantoprazole 40 mg p.o. daily.    Leiden factor V deficiency, history of LLE DVT -On Lovenox.    LOS: 2 days   Princella Pellegrini. Zehr  09/24/2022, 8:54 AM     [facility] Attending   I have taken an interval history, reviewed the chart and examined the patient. I agree with the Advanced Practitioner's note, impression and recommendations.  Majority the medical decision-making in the formulation of the assessment and plan were performed by me.  He is about  the same, I think. Not worse. Mom quite concerned about things and asking for something to change. Exam is same or maybe slightly better than yesterday.   I am going to change Abx to Zosyn, and if he is same tomorrow agree w/ TRH that CT scan is appropriate. Would consider [facility] consult for input also.  Iva Boop, MD, [facility] See Loretha Stapler on call - gastroenterology for best contact person 09/24/2022 5:09 PM

## 2022-09-24 NOTE — Progress Notes (Signed)
Pharmacy Antibiotic Note  Benjamin [name] is a 52 y.o. male admitted on 09/22/2022 with sepsis secondary to acute recurrent sigmoid diverticulitis. Pharmacy has been consulted for Zosyn dosing.  Plan: Zosyn 3.375g IV q8h (each dose infused over 4 hours)  Need for further dosage adjustment appears unlikely at present, so pharmacy will sign off at this time.  Please reconsult if a change in clinical status warrants re-evaluation of dosage.   Height:  (175.3 cm) Weight: 86.2 kg (190 lb) IBW/kg (Calculated) : 70.7  Temp (24hrs), Avg:97.9 F (36.6 C), Min:97.7 F (36.5 C), Max:98.2 F (36.8 C)  Recent Labs  Lab 09/22/22 1145 09/22/22 2329 09/23/22 0448 09/24/22 0451  WBC 18.6*  --  19.3* 18.4*  CREATININE 1.13  --  0.99 0.94  LATICACIDVEN  --  0.9  --   --     Estimated Creatinine Clearance: 98.9 mL/min (by C-G formula based on SCr of 0.94 mg/dL).    Allergies  Allergen Reactions   Bismuth Subsalicylate Swelling   Compazine [Prochlorperazine Edisylate] Other (See Comments)    Cervical dystonia and panic attack    Antimicrobials this admission: 4/23 Cipro >> 4/25 4/23 Metronidazole >> 4/25 4/25 Zosyn >>  Microbiology results: 4/25 BCx:   Thank you for allowing pharmacy to be a part of this patient's care.   Greer Pickerel, PharmD, BCPS Clinical Pharmacist 09/24/2022 5:13 PM

## 2022-09-24 NOTE — Progress Notes (Signed)
PROGRESS NOTE  Benjamin [name] AVW:[id number] DOB: September 09, 1970 DOA: 09/22/2022 PCP: Ofilia Neas, PA-C  HPI/Recap of past 24 hours: Benjamin [name] is a 52 y.o. 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 Reported a sharp stabbing left lower quadrant abdominal pain. No associated fevers, some associated nausea.  Feels similar to his prior diverticulitis. In the ED, was noted to have leukocytosis, and was tachycardic. CT scan of the abdomen showed recurrent acute sigmoid diverticulitis, no evidence of bowel perforation obstruction or abscess. He was given IV ciprofloxacin, IV Flagyl, and admitted to the hospitalist service for further management    Today, patient still complaining of abdominal pain, noted white count still persistently high.  Able to tolerate orally, but with continued poor appetite.  Had an episode of nausea and 1 episode of vomiting overnight.  Mother at bedside   Assessment/Plan: Principal Problem:   Sepsis Active Problems:   Essential hypertension   Diverticulitis   Acute diverticulitis   Sepsis 2/2 acute recurrent sigmoid diverticulitis On presentation, noted to be tachycardic, with leukocytosis Currently afebrile, with leukocytosis BC x 2 pending Procalcitonin 0.40 CT scan of the abdomen showed recurrent acute sigmoid diverticulitis, no evidence of bowel perforation obstruction or abscess, if persistent leukocytosis, may need to repeat CT abdomen/pelvis and possibly a change of antibiotics Continue IV Cipro, Flagyl Continue IV fluids, pain management Diet as tolerated GI consulted as per patient's request May need general surgery consultation given recurrent  Hypokalemia Mild hyponatremia Replace as needed Daily BMP  Hypertension Uncontrolled likely 2/2 pain Continue diltiazem, losartan, hydralazine as needed,  metoprolol as needed Pain management  History of migraines Continues to report persistent headache, ?migraine flare Reports Norco works best for him Continue pain management  History of GERD PPI  History of factor V Leiden deficiency History of LLE DVT Continue Lovenox     Estimated body mass index is 28.06 kg/m as calculated from the following:   Height as of this encounter:  (1.753 m).   Weight as of this encounter: 86.2 kg.     Code Status: Full  Family Communication: Discussed with mother at bedside  Disposition Plan: Status is: Inpatient Remains inpatient appropriate because: Level of care      Consultants: GI as per patient's request  Procedures: None  Antimicrobials: Ciprofloxacin Flagyl  DVT prophylaxis: Lovenox   Objective: Vitals:   09/23/22 2133 09/24/22 0007 09/24/22 0527 09/24/22 1340  BP: (!) 144/106 (!) 146/104 (!) 146/99 (!) 131/94  Pulse: 87 85 77 76  Resp: Temp: 98.1 F (36.7 C) 97.8 F (36.6 C) 97.7 F (36.5 C) 97.7 F (36.5 C)  TempSrc: Oral Oral Oral Oral  SpO2: 96% 95% 98% 96%  Weight:      Height:        Intake/Output Summary (Last 24 hours) at 09/24/2022 1607 Last data filed at 09/24/2022 1300 Gross per 24 hour  Intake 3842.97 ml  Output 2550 ml  Net 1292.97 ml   Filed Weights   09/22/22 1141  Weight: 86.2 kg    Exam: General: NAD  Cardiovascular: S1, S2 present Respiratory: CTAB Abdomen: Soft, tender, nondistended, bowel sounds present Musculoskeletal: No bilateral pedal edema noted Skin: Normal Psychiatry: Normal mood     Data Reviewed: CBC: Recent Labs  Lab 09/22/22 1145 09/23/22 0448 09/24/22 0451  WBC 18.6* 19.3* 18.4*  HGB 15.8 15.2 14.1  HCT 45.7  44.1 41.8  MCV 89.1 90.0 90.5  PLT 409* 332 321   Basic Metabolic Panel: Recent Labs  Lab 09/22/22 1145 09/23/22 0448 09/24/22 0451  NA 137 133* 131*  K 3.6 3.7 3.4*  CL 101 100 100  CO2 26 23 21*  GLUCOSE 96 114* 123*   BUN CREATININE 1.13 0.99 0.94  CALCIUM 10.1 8.9 8.5*  MG  --   --  2.2   GFR: Estimated Creatinine Clearance: 98.9 mL/min (by C-G formula based on SCr of 0.94 mg/dL). Liver Function Tests: Recent Labs  Lab 09/22/22 1145  AST 17  ALT 16  ALKPHOS 77  BILITOT 1.3*  PROT 8.1  ALBUMIN 4.8   Recent Labs  Lab 09/22/22 1145  LIPASE <10*   No results for input(s): "AMMONIA" in the last 168 hours. Coagulation Profile: No results for input(s): "INR", "PROTIME" in the last 168 hours. Cardiac Enzymes: No results for input(s): "CKTOTAL", "CKMB", "CKMBINDEX", "TROPONINI" in the last 168 hours. BNP (last 3 results) No results for input(s): "PROBNP" in the last 8760 hours. HbA1C: No results for input(s): "HGBA1C" in the last 72 hours. CBG: No results for input(s): "GLUCAP" in the last 168 hours. Lipid Profile: No results for input(s): "CHOL", "HDL", "LDLCALC", "TRIG", "CHOLHDL", "LDLDIRECT" in the last 72 hours. Thyroid Function Tests: No results for input(s): "TSH", "T4TOTAL", "FREET4", "T3FREE", "THYROIDAB" in the last 72 hours. Anemia Panel: No results for input(s): "VITAMINB12", "FOLATE", "FERRITIN", "TIBC", "IRON", "RETICCTPCT" in the last 72 hours. Urine analysis:    Component Value Date/Time   COLORURINE COLORLESS (A) 09/22/2022 1145   APPEARANCEUR CLEAR 09/22/2022 1145   LABSPEC 1.007 09/22/2022 1145   PHURINE 7.0 09/22/2022 1145   GLUCOSEU NEGATIVE 09/22/2022 1145   GLUCOSEU NEGATIVE 09/02/2015 1443   HGBUR NEGATIVE 09/22/2022 1145   BILIRUBINUR NEGATIVE 09/22/2022 1145   BILIRUBINUR negative 04/05/2018 1102   BILIRUBINUR negative 04/26/2017 1448   KETONESUR NEGATIVE 09/22/2022 1145   PROTEINUR NEGATIVE 09/22/2022 1145   UROBILINOGEN 0.2 04/05/2018 1102   UROBILINOGEN 0.2 09/02/2015 1443   NITRITE NEGATIVE 09/22/2022 1145   LEUKOCYTESUR NEGATIVE 09/22/2022 1145   Sepsis Labs: (procalcitonin:4,lacticidven:4)  )No results found for this or  any previous visit (from the past 240 hour(s)).    Studies: No results found.  Scheduled Meds:  diltiazem  240 mg Oral Daily   enoxaparin (LOVENOX) injection  40 mg Subcutaneous Q24H   esomeprazole  40 mg Oral Q1200   losartan  50 mg Oral Daily   polyethylene glycol  17 g Oral Daily   senna-docusate  1 tablet Oral BID   valACYclovir  500 mg Oral Daily    Continuous Infusions:  sodium chloride 100 mL/hr at 09/24/22 0359   ciprofloxacin 400 mg (09/24/22 0449)   metronidazole 500 mg (09/24/22 0558)     LOS: 2 days     Briant Cedar, MD [facility]  If 7PM-7AM, please contact night-coverage [email or web address] 09/24/2022, 4:07 PM

## 2022-09-25 DIAGNOSIS — K5732 Diverticulitis of large intestine without perforation or abscess without bleeding: Secondary | ICD-10-CM | POA: Diagnosis not present

## 2022-09-25 DIAGNOSIS — A419 Sepsis, unspecified organism: Secondary | ICD-10-CM | POA: Diagnosis not present

## 2022-09-25 DIAGNOSIS — Z8719 Personal history of other diseases of the digestive system: Secondary | ICD-10-CM | POA: Diagnosis not present

## 2022-09-25 DIAGNOSIS — Z86010 Personal history of colonic polyps: Secondary | ICD-10-CM | POA: Diagnosis not present

## 2022-09-25 DIAGNOSIS — Z86718 Personal history of other venous thrombosis and embolism: Secondary | ICD-10-CM | POA: Diagnosis not present

## 2022-09-25 LAB — BASIC METABOLIC PANEL
Anion gap: 10 (ref 5–15)
BUN: 11 mg/dL (ref 6–20)
CO2: 25 mmol/L (ref 22–32)
Calcium: 8.5 mg/dL — ABNORMAL LOW (ref 8.9–10.3)
Chloride: 101 mmol/L (ref 98–111)
Creatinine, Ser: 1.05 mg/dL (ref 0.61–1.24)
GFR, Estimated: >60 mL/min (ref >60)
Glucose, Bld: 87 mg/dL (ref 70–99)
Potassium: 3.4 mmol/L — ABNORMAL LOW (ref 3.5–5.1)
Sodium: 136 mmol/L (ref 135–145)

## 2022-09-25 LAB — CBC
HCT: 41.2 % (ref 39.0–52.0)
Hemoglobin: 13.7 g/dL (ref 13.0–17.0)
MCH: 30.6 pg (ref 26.0–34.0)
MCHC: 33.3 g/dL (ref 30.0–36.0)
MCV: 92.2 fL (ref 80.0–100.0)
Platelets: 332 10*3/uL (ref 150–400)
RBC: 4.47 MIL/uL (ref 4.22–5.81)
RDW: 12.7 % (ref 11.5–15.5)
WBC: 10.1 10*3/uL (ref 4.0–10.5)
nRBC: 0 % (ref 0.0–0.2)

## 2022-09-25 LAB — CULTURE, BLOOD (ROUTINE X 2): Culture: NO GROWTH

## 2022-09-25 MED ORDER — POTASSIUM CHLORIDE CRYS ER 20 MEQ PO TBCR
40.0000 meq | EXTENDED_RELEASE_TABLET | Freq: Two times a day (BID) | ORAL | Status: AC
  Administered 2022-09-25 (×2): 40 meq via ORAL
  Filled 2022-09-25 (×2): qty 2

## 2022-09-25 MED ORDER — SODIUM CHLORIDE 0.9 % IV SOLN: INTRAVENOUS | Status: DC

## 2022-09-25 NOTE — Progress Notes (Signed)
PROGRESS NOTE  Benjamin [name] ZOX:[id number] DOB: 03/31/1971 DOA: 09/22/2022 PCP: Ofilia Neas, PA-C  HPI/Recap of past 24 hours: Benjamin [name] is a 52 y.o. 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 Reported a sharp stabbing left lower quadrant abdominal pain. No associated fevers, some associated nausea.  Feels similar to his prior diverticulitis. In the ED, was noted to have leukocytosis, and was tachycardic. CT scan of the abdomen showed recurrent acute sigmoid diverticulitis, no evidence of bowel perforation obstruction or abscess. He was given IV ciprofloxacin, IV Flagyl, and admitted to the hospitalist service for further management    Today, patient reports abdominal pain is improving, denies any further nausea and vomiting, eager to eat.   Assessment/Plan: Principal Problem:   Sepsis (HCC) Active Problems:   Essential hypertension   Diverticulitis   Acute diverticulitis   Sepsis 2/2 acute recurrent sigmoid diverticulitis On presentation, noted to be tachycardic, with leukocytosis Currently afebrile, with resolved leukocytosis BC x 2 NGTD Procalcitonin 0.40 CT scan of the abdomen showed recurrent acute sigmoid diverticulitis, no evidence of bowel perforation obstruction or abscess, if worsening abdominal pain, may need to repeat CT abdomen/pelvis Changed to Zosyn on 4/26 Continue gentle IV fluids until can tolerate diet adequately Pain management GI consulted as per patient's request May need general surgery consultation given recurrent  Hypokalemia Mild hyponatremia Replace as needed Daily BMP  Hypertension Uncontrolled likely 2/2 pain Continue diltiazem, losartan, hydralazine as needed, metoprolol as needed Pain management  History of migraines Continues to report persistent headache, ?migraine flare Reports Norco  works best for him Continue pain management  History of GERD PPI  History of factor V Leiden deficiency History of LLE DVT Continue Lovenox     Estimated body mass index is 28.06 kg/m as calculated from the following:   Height as of this encounter: 5\' 9"  (1.753 m).   Weight as of this encounter: 86.2 kg.     Code Status: Full  Family Communication: Discussed with mother at bedside  Disposition Plan: Status is: Inpatient Remains inpatient appropriate because: Level of care      Consultants: GI as per patient's request  Procedures: None  Antimicrobials: Zosyn  DVT prophylaxis: Lovenox   Objective: Vitals:   09/24/22 1340 09/24/22 2214 09/25/22 0539 09/25/22 1210  BP: (!) 131/94 (!) 131/94 (!) 159/105 (!) 149/94  Pulse: 76 75 82 64  Resp: 15 16 17 18   Temp: 97.7 F (36.5 C) 98.4 F (36.9 C) 98.7 F (37.1 C) 97.6 F (36.4 C)  TempSrc: Oral Oral Oral Oral  SpO2: 96% 97% 100% 97%  Weight:      Height:        Intake/Output Summary (Last 24 hours) at 09/25/2022 1522 Last data filed at 09/25/2022 1212 Gross per 24 hour  Intake 2050.04 ml  Output 3250 ml  Net -1199.96 ml   Filed Weights   09/22/22 1141  Weight: 86.2 kg    Exam: General: NAD  Cardiovascular: S1, S2 present Respiratory: CTAB Abdomen: Soft, tender, nondistended, bowel sounds present Musculoskeletal: No bilateral pedal edema noted Skin: Normal Psychiatry: Normal mood     Data Reviewed: CBC: Recent Labs  Lab 09/22/22 1145 09/23/22 0448 09/24/22 0451 09/25/22 0440  WBC 18.6* 19.3* 18.4* 10.1  HGB 15.8 15.2 14.1 13.7  HCT 45.7 44.1 41.8 41.2  MCV 89.1 90.0 90.5 92.2  PLT 409* 332 321 332   Basic Metabolic  Panel: Recent Labs  Lab 09/22/22 1145 09/23/22 0448 09/24/22 0451 09/25/22 0440  NA 137 133* 131* 136  K 3.6 3.7 3.4* 3.4*  CL 101 100 100 101  CO2 26 23 21* 25  GLUCOSE 96 114* 123* 87  BUN 13 11 10 11   CREATININE 1.13 0.99 0.94 1.05  CALCIUM 10.1 8.9 8.5*  8.5*  MG  --   --  2.2  --    GFR: Estimated Creatinine Clearance: 88.5 mL/min (by C-G formula based on SCr of 1.05 mg/dL). Liver Function Tests: Recent Labs  Lab 09/22/22 1145  AST 17  ALT 16  ALKPHOS 77  BILITOT 1.3*  PROT 8.1  ALBUMIN 4.8   Recent Labs  Lab 09/22/22 1145  LIPASE <10*   No results for input(s): "AMMONIA" in the last 168 hours. Coagulation Profile: No results for input(s): "INR", "PROTIME" in the last 168 hours. Cardiac Enzymes: No results for input(s): "CKTOTAL", "CKMB", "CKMBINDEX", "TROPONINI" in the last 168 hours. BNP (last 3 results) No results for input(s): "PROBNP" in the last 8760 hours. HbA1C: No results for input(s): "HGBA1C" in the last 72 hours. CBG: No results for input(s): "GLUCAP" in the last 168 hours. Lipid Profile: No results for input(s): "CHOL", "HDL", "LDLCALC", "TRIG", "CHOLHDL", "LDLDIRECT" in the last 72 hours. Thyroid Function Tests: No results for input(s): "TSH", "T4TOTAL", "FREET4", "T3FREE", "THYROIDAB" in the last 72 hours. Anemia Panel: No results for input(s): "VITAMINB12", "FOLATE", "FERRITIN", "TIBC", "IRON", "RETICCTPCT" in the last 72 hours. Urine analysis:    Component Value Date/Time   COLORURINE COLORLESS (A) 09/22/2022 1145   APPEARANCEUR CLEAR 09/22/2022 1145   LABSPEC 1.007 09/22/2022 1145   PHURINE 7.0 09/22/2022 1145   GLUCOSEU NEGATIVE 09/22/2022 1145   GLUCOSEU NEGATIVE 09/02/2015 1443   HGBUR NEGATIVE 09/22/2022 1145   BILIRUBINUR NEGATIVE 09/22/2022 1145   BILIRUBINUR negative 04/05/2018 1102   BILIRUBINUR negative 04/26/2017 1448   KETONESUR NEGATIVE 09/22/2022 1145   PROTEINUR NEGATIVE 09/22/2022 1145   UROBILINOGEN 0.2 04/05/2018 1102   UROBILINOGEN 0.2 09/02/2015 1443   NITRITE NEGATIVE 09/22/2022 1145   LEUKOCYTESUR NEGATIVE 09/22/2022 1145   Sepsis Labs: @LABRCNTIP (procalcitonin:4,lacticidven:4)  ) Recent Results (from the past 240 hour(s))  Culture, blood (Routine X 2) w Reflex to  ID Panel     Status: None (Preliminary result)   Collection Time: 09/24/22 10:11 AM   Specimen: BLOOD RIGHT ARM  Result Value Ref Range Status   Specimen Description   Final    BLOOD RIGHT ARM Performed at [facility], [address], [place], [place] [address]    Special Requests   Final    BOTTLES DRAWN AEROBIC AND ANAEROBIC Blood Culture adequate volume Performed at [facility], [address], [place], [place] [address]    Culture   Final    NO GROWTH < 24 HOURS Performed at [facility], [address] [place], [place] [address]    Report Status PENDING  Incomplete  Culture, blood (Routine X 2) w Reflex to ID Panel     Status: None (Preliminary result)   Collection Time: 09/24/22 10:20 AM   Specimen: BLOOD RIGHT ARM  Result Value Ref Range Status   Specimen Description   Final    BLOOD RIGHT ARM Performed at [facility], [address], [place], [place] [address]    Special Requests   Final    AEROBIC BOTTLE ONLY Blood Culture adequate volume Performed at [facility], [address] [place], [place] [address]    Culture   Final    NO GROWTH < 24 HOURS Performed at [facility], [address], [place], [place] [address]    Report Status PENDING  Incomplete      Studies: No results found.  Scheduled Meds:  diltiazem  240 mg Oral Daily   enoxaparin (LOVENOX) injection  40 mg Subcutaneous Q24H   esomeprazole  40 mg Oral Q1200   losartan  50 mg Oral Daily   polyethylene glycol  17 g Oral Daily   potassium chloride  40 mEq Oral BID   senna-docusate  1 tablet Oral BID   valACYclovir  500 mg Oral Daily    Continuous Infusions:  sodium chloride 100 mL/hr at 09/25/22 0121   piperacillin-tazobactam (ZOSYN)  IV 3.375 g (09/25/22 1023)     LOS: 3 days     Briant Cedar, MD [facility]  If 7PM-7AM, please contact night-coverage [email or web address] 09/25/2022,  3:22 PM

## 2022-09-25 NOTE — Progress Notes (Addendum)
[facility] Progress Note  CC:  Diverticulitis   Subjective: Changed antibiotics to Zosyn yesterday.  White blood cell count normalized today.  He says that pain is better also.  Objective:  Vital signs in last 24 hours: Temp:  [97.7 F (36.5 C)-98.7 F (37.1 C)] 98.7 F (37.1 C) (04/26 0539) Pulse Rate:  [75-82] 82 (04/26 0539) Resp:  [15-17] 17 (04/26 0539) BP: (131-159)/(94-105) 159/105 (04/26 0539) SpO2:  [96 %-100 %] 100 % (04/26 0539) Last BM Date : 09/22/22 General:  Alert, Well-developed, in NAD Heart:  Regular rate and rhythm; no murmurs Pulm:  CTAB.  No W/R/R. Abdomen:  Soft, non-distended.  BS present.  Some lower abdominal TTP.   Extremities:  Without edema. Neurologic:  Alert and oriented x 4;  grossly normal neurologically. Psych:  Alert and cooperative. Normal mood and affect.  Intake/Output from previous day: 04/25 0701 - 04/26 0700 In: 2530 [P.O.:1050; I.V.:1380; IV Piggyback:100] Out: 3150 [Urine:3150]  Lab Results: Recent Labs    09/23/22 0448 09/24/22 0451 09/25/22 0440  WBC 19.3* 18.4* 10.1  HGB 15.2 14.1 13.7  HCT 44.1 41.8 41.2  PLT 332 321 332   BMET Recent Labs    09/23/22 0448 09/24/22 0451 09/25/22 0440  NA 133* 131* 136  K 3.7 3.4* 3.4*  CL 100 100 101  CO2 23 21* 25  GLUCOSE 114* 123* 87  BUN 11 10 11   CREATININE 0.99 0.94 1.05  CALCIUM 8.9 8.5* 8.5*   LFT Recent Labs    09/22/22 1145  PROT 8.1  ALBUMIN 4.8  AST 17  ALT 16  ALKPHOS 77  BILITOT 1.3*   Assessment / Plan: 52 year old male with a history of recurrent sigmoid diverticulitis admitted to the hospital 09/22/2022 with RLQ and LLQ pain. CTAP with contrast identified acute sigmoid diverticulitis with long segment wall thickening and surrounding inflammation with a small amount of free pelvic fluid without evidence of perforation or abscess. WBC 18.6 -> 19.3->18.4 K->10.1 today.  Changed antibiotics to Zosyn yesterday.  Afebrile.  Hemodynamically  stable.  Pain is improved.  Blood cultures tentatively negative. -Continue Zosyn. -IV fluids and pain management per the hospitalist. -Colonoscopy as an outpatient in 8 weeks. -Consider colorectal surgery consult as an outpatient. -Diet was pulled back to clear liquid yesterday as well but since he is feeling better and feel hungry will allow full liquids.  -Repeat CTAP on if pain worsens again or fails to improve further.  History of a 7mm tubular adenomatous cecal polyp per colonoscopy 09/2015.  -Colonoscopy in 8 weeks as noted above.    History of GERD/Barrett's esophagus:  Per Dr. Leone Payor, no Barrett's by new criteria (need > 1 cm columnar change) and he did not have that so he does not need an EGD.  -Pantoprazole 40 mg p.o. daily.    Leiden factor V deficiency, history of LLE DVT -On Lovenox.   LOS: 3 days   Princella Pellegrini. Zehr  09/25/2022, 8:49 AM      GI Attending   I have taken an interval history, reviewed the chart and examined the patient. I agree with the Advanced Practitioner's note, impression and recommendations.   Much better today  Advance diet tomorrow if same/better  May be ready to dc  Would Tx Augmentin 875 mg bid x 14 d total (since Zosyn)  I plan to round at some point.  Will get f/u also  Iva Boop, MD, [facility] See Loretha Stapler on call -  gastroenterology for best contact person 09/25/2022 5:34 PM

## 2022-09-26 DIAGNOSIS — Z86718 Personal history of other venous thrombosis and embolism: Secondary | ICD-10-CM | POA: Diagnosis not present

## 2022-09-26 DIAGNOSIS — K5732 Diverticulitis of large intestine without perforation or abscess without bleeding: Secondary | ICD-10-CM | POA: Diagnosis not present

## 2022-09-26 DIAGNOSIS — A419 Sepsis, unspecified organism: Secondary | ICD-10-CM | POA: Diagnosis not present

## 2022-09-26 DIAGNOSIS — Z86010 Personal history of colonic polyps: Secondary | ICD-10-CM | POA: Diagnosis not present

## 2022-09-26 DIAGNOSIS — Z8719 Personal history of other diseases of the digestive system: Secondary | ICD-10-CM | POA: Diagnosis not present

## 2022-09-26 LAB — BASIC METABOLIC PANEL
Anion gap: 11 (ref 5–15)
BUN: 11 mg/dL (ref 6–20)
CO2: 21 mmol/L — ABNORMAL LOW (ref 22–32)
Calcium: 8.7 mg/dL — ABNORMAL LOW (ref 8.9–10.3)
Chloride: 102 mmol/L (ref 98–111)
Creatinine, Ser: 0.94 mg/dL (ref 0.61–1.24)
GFR, Estimated: >60 mL/min (ref >60)
Glucose, Bld: 80 mg/dL (ref 70–99)
Potassium: 3.6 mmol/L (ref 3.5–5.1)
Sodium: 134 mmol/L — ABNORMAL LOW (ref 135–145)

## 2022-09-26 LAB — CBC
HCT: 42 % (ref 39.0–52.0)
Hemoglobin: 13.9 g/dL (ref 13.0–17.0)
MCH: 30.3 pg (ref 26.0–34.0)
MCHC: 33.1 g/dL (ref 30.0–36.0)
MCV: 91.7 fL (ref 80.0–100.0)
Platelets: 385 10*3/uL (ref 150–400)
RBC: 4.58 MIL/uL (ref 4.22–5.81)
RDW: 12.8 % (ref 11.5–15.5)
WBC: 9.1 10*3/uL (ref 4.0–10.5)
nRBC: 0 % (ref 0.0–0.2)

## 2022-09-26 MED ORDER — POLYETHYLENE GLYCOL 3350 17 G PO PACK
17.0000 g | PACK | Freq: Two times a day (BID) | ORAL | Status: DC
  Administered 2022-09-26 – 2022-09-27 (×2): 17 g via ORAL
  Filled 2022-09-26 (×2): qty 1

## 2022-09-26 MED ORDER — SENNOSIDES-DOCUSATE SODIUM 8.6-50 MG PO TABS
2.0000 | ORAL_TABLET | Freq: Two times a day (BID) | ORAL | Status: DC
  Administered 2022-09-26 – 2022-09-27 (×2): 2 via ORAL
  Filled 2022-09-26 (×2): qty 2

## 2022-09-26 MED ORDER — HYDROCODONE-ACETAMINOPHEN 5-325 MG PO TABS
1.0000 | ORAL_TABLET | ORAL | Status: DC | PRN
  Administered 2022-09-26 (×3): 1 via ORAL
  Filled 2022-09-26 (×3): qty 1

## 2022-09-26 MED ORDER — HYDROMORPHONE HCL 1 MG/ML IJ SOLN
0.5000 mg | Freq: Four times a day (QID) | INTRAMUSCULAR | Status: DC | PRN
  Filled 2022-09-26: qty 0.5

## 2022-09-26 MED ORDER — BUTALBITAL-APAP-CAFFEINE 50-325-40 MG PO TABS
1.0000 | ORAL_TABLET | Freq: Four times a day (QID) | ORAL | Status: DC | PRN
  Administered 2022-09-26: 1 via ORAL
  Administered 2022-09-26: 2 via ORAL
  Administered 2022-09-26: 1 via ORAL
  Filled 2022-09-26 (×3): qty 1
  Filled 2022-09-26: qty 2

## 2022-09-26 MED ORDER — CETIRIZINE HCL 10 MG PO TABS
10.0000 mg | ORAL_TABLET | Freq: Every day | ORAL | Status: DC
  Administered 2022-09-26 – 2022-09-27 (×2): 10 mg via ORAL
  Filled 2022-09-26 (×2): qty 1

## 2022-09-26 NOTE — Progress Notes (Signed)
PROGRESS NOTE  DANYELL AWBREY FAO:[id number] DOB: April 15, 1971 DOA: 09/22/2022 PCP: Ofilia Neas, PA-C  HPI/Recap of past 24 hours: LARRELL RAPOZO is a 52 y.o. 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 Reported a sharp stabbing left lower quadrant abdominal pain. No associated fevers, some associated nausea.  Feels similar to his prior diverticulitis. In the ED, was noted to have leukocytosis, and was tachycardic. CT scan of the abdomen showed recurrent acute sigmoid diverticulitis, no evidence of bowel perforation obstruction or abscess. He was given IV ciprofloxacin, IV Flagyl, and admitted to the hospitalist service for further management    Today patient reported history of migraines which have been ongoing since his admission, prompting patient to have an episode of nausea and vomiting.  Reports minimal abdominal pain now on the left lower quadrant, reports abdominal pain is improving.  Discussed extensively with wife and mother at bedside.  No BM for the past couple of days   Assessment/Plan: Principal Problem:   Sepsis (HCC) Active Problems:   Essential hypertension   Diverticulitis   Acute diverticulitis   Sepsis 2/2 acute recurrent sigmoid diverticulitis On presentation, noted to be tachycardic, with leukocytosis Currently afebrile, with resolved leukocytosis BC x 2 NGTD Procalcitonin 0.40 CT scan of the abdomen showed recurrent acute sigmoid diverticulitis, no evidence of bowel perforation obstruction or abscess, if worsening abdominal pain, may need to repeat CT abdomen/pelvis Changed to Zosyn on 4/26 Pain management Strict bowel regimen GI consulted as per patient's request  Hypokalemia Mild hyponatremia Replace as needed Daily BMP  Hypertension Uncontrolled likely 2/2 pain Continue diltiazem, losartan, hydralazine as  needed, metoprolol as needed Pain management  History of migraines Continues to report persistent headache, ?migraine flare Last [facility] for migraine by [facility] [place], recommend to follow-up with neurology as patient noted to be consuming lots of Goody powder, NSAIDs, caffeine for control of his migraines Started patient on Fioricet  History of GERD PPI  History of factor V Leiden deficiency History of LLE DVT Continue Lovenox     Estimated body mass index is 28.06 kg/m as calculated from the following:   Height as of this encounter: 5\' 9"  (1.753 m).   Weight as of this encounter: 86.2 kg.     Code Status: Full  Family Communication: Discussed with wife and mother at bedside  Disposition Plan: Status is: Inpatient Remains inpatient appropriate because: Level of care      Consultants: GI as per patient's request  Procedures: None  Antimicrobials: Zosyn  DVT prophylaxis: Lovenox   Objective: Vitals:   09/25/22 1210 09/25/22 2153 09/26/22 0432 09/26/22 1352  BP: (!) 149/94 (!) 153/111 (!) 155/106 (!) 133/93  Pulse: 64 66 79 73  Resp: 18 15 15 18   Temp: 97.6 F (36.4 C) 97.7 F (36.5 C) 98 F (36.7 C) 97.8 F (36.6 C)  TempSrc: Oral Oral Oral   SpO2: 97% 96% 100% 99%  Weight:      Height:        Intake/Output Summary (Last 24 hours) at 09/26/2022 1519 Last data filed at 09/26/2022 1000 Gross per 24 hour  Intake 3520.04 ml  Output 1800 ml  Net 1720.04 ml   Filed Weights   09/22/22 1141  Weight: 86.2 kg    Exam: General: NAD  Cardiovascular: S1, S2 present Respiratory: CTAB Abdomen: Soft, tender, nondistended, bowel sounds present Musculoskeletal: No bilateral pedal edema noted Skin:  Normal Psychiatry: Normal mood     Data Reviewed: CBC: Recent Labs  Lab 09/22/22 1145 09/23/22 0448 09/24/22 0451 09/25/22 0440 09/26/22 0503  WBC 18.6* 19.3* 18.4* 10.1 9.1  HGB 15.8 15.2 14.1 13.7 13.9  HCT 45.7 44.1 41.8  41.2 42.0  MCV 89.1 90.0 90.5 92.2 91.7  PLT 409* 332 321 332 385   Basic Metabolic Panel: Recent Labs  Lab 09/22/22 1145 09/23/22 0448 09/24/22 0451 09/25/22 0440 09/26/22 0503  NA 137 133* 131* 136 134*  K 3.6 3.7 3.4* 3.4* 3.6  CL 101 100 100 101 102  CO2 26 23 21* 25 21*  GLUCOSE 96 114* 123* 87 80  BUN 13 11 10 11 11   CREATININE 1.13 0.99 0.94 1.05 0.94  CALCIUM 10.1 8.9 8.5* 8.5* 8.7*  MG  --   --  2.2  --   --    GFR: Estimated Creatinine Clearance: 98.9 mL/min (by C-G formula based on SCr of 0.94 mg/dL). Liver Function Tests: Recent Labs  Lab 09/22/22 1145  AST 17  ALT 16  ALKPHOS 77  BILITOT 1.3*  PROT 8.1  ALBUMIN 4.8   Recent Labs  Lab 09/22/22 1145  LIPASE <10*   No results for input(s): "AMMONIA" in the last 168 hours. Coagulation Profile: No results for input(s): "INR", "PROTIME" in the last 168 hours. Cardiac Enzymes: No results for input(s): "CKTOTAL", "CKMB", "CKMBINDEX", "TROPONINI" in the last 168 hours. BNP (last 3 results) No results for input(s): "PROBNP" in the last 8760 hours. HbA1C: No results for input(s): "HGBA1C" in the last 72 hours. CBG: No results for input(s): "GLUCAP" in the last 168 hours. Lipid Profile: No results for input(s): "CHOL", "HDL", "LDLCALC", "TRIG", "CHOLHDL", "LDLDIRECT" in the last 72 hours. Thyroid Function Tests: No results for input(s): "TSH", "T4TOTAL", "FREET4", "T3FREE", "THYROIDAB" in the last 72 hours. Anemia Panel: No results for input(s): "VITAMINB12", "FOLATE", "FERRITIN", "TIBC", "IRON", "RETICCTPCT" in the last 72 hours. Urine analysis:    Component Value Date/Time   COLORURINE COLORLESS (A) 09/22/2022 1145   APPEARANCEUR CLEAR 09/22/2022 1145   LABSPEC 1.007 09/22/2022 1145   PHURINE 7.0 09/22/2022 1145   GLUCOSEU NEGATIVE 09/22/2022 1145   GLUCOSEU NEGATIVE 09/02/2015 1443   HGBUR NEGATIVE 09/22/2022 1145   BILIRUBINUR NEGATIVE 09/22/2022 1145   BILIRUBINUR negative 04/05/2018 1102    BILIRUBINUR negative 04/26/2017 1448   KETONESUR NEGATIVE 09/22/2022 1145   PROTEINUR NEGATIVE 09/22/2022 1145   UROBILINOGEN 0.2 04/05/2018 1102   UROBILINOGEN 0.2 09/02/2015 1443   NITRITE NEGATIVE 09/22/2022 1145   LEUKOCYTESUR NEGATIVE 09/22/2022 1145   Sepsis Labs: @LABRCNTIP (procalcitonin:4,lacticidven:4)  ) Recent Results (from the past 240 hour(s))  Culture, blood (Routine X 2) w Reflex to ID Panel     Status: None (Preliminary result)   Collection Time: 09/24/22 10:11 AM   Specimen: BLOOD RIGHT ARM  Result Value Ref Range Status   Specimen Description   Final    BLOOD RIGHT ARM Performed at [facility], [address], [place], [place] [address]    Special Requests   Final    BOTTLES DRAWN AEROBIC AND ANAEROBIC Blood Culture adequate volume Performed at [facility], [address], [place], [place] [address]    Culture   Final    NO GROWTH 2 DAYS Performed at [facility], [address], [place], [place] [address]    Report Status PENDING  Incomplete  Culture, blood (Routine X 2) w Reflex to ID Panel  Status: None (Preliminary result)   Collection Time: 09/24/22 10:20 AM   Specimen: BLOOD RIGHT ARM  Result Value Ref Range Status   Specimen Description   Final    BLOOD RIGHT ARM Performed at [facility], [address], [place], [place] [address]    Special Requests   Final    AEROBIC BOTTLE ONLY Blood Culture adequate volume Performed at [facility], [address] [place], [place] [address]    Culture   Final    NO GROWTH 2 DAYS Performed at [facility], [address], [place], [place] [address]    Report Status PENDING  Incomplete      Studies: No results found.  Scheduled Meds:  diltiazem  240 mg Oral Daily   enoxaparin (LOVENOX) injection  40 mg Subcutaneous Q24H   esomeprazole  40 mg Oral Q1200   losartan  50 mg Oral Daily   polyethylene glycol  17  g Oral BID   senna-docusate  2 tablet Oral BID   valACYclovir  500 mg Oral Daily    Continuous Infusions:  piperacillin-tazobactam (ZOSYN)  IV 3.375 g (09/26/22 1051)     LOS: 4 days     Briant Cedar, MD [facility]  If 7PM-7AM, please contact night-coverage [email or web address] 09/26/2022, 3:19 PM

## 2022-09-26 NOTE — Progress Notes (Addendum)
   Patient Name: Benjamin [name] Date of Encounter: 09/26/2022, 10:26 AM     Assessment and Plan  Acute sigmoid diverticulitis - much improved. Passing flatus but no BM  I think ok for dc today from GI standpoint (has other issues keeping him per [facility])  Would use soft diet max and bid MiraLAx  My office will arrange f/u  Augment 875 mg bid for 14 d total combo Zosyn and Augmentin  Signing off   Subjective  Less pain - feeling better   Objective  BP (!) 155/106 (BP Location: Right Arm)   Pulse 79   Temp 98 F (36.7 C) (Oral)   Resp 15   Ht 5\' 9"  (1.753 m)   Wt 86.2 kg   SpO2 100%   BMI 28.06 kg/m  Abdomen soft BS + and mild LLQ tenderness to deep paplpation     Latest Ref Rng & Units 09/26/2022    5:03 AM 09/25/2022    4:40 AM 09/24/2022    4:51 AM  CBC  WBC 4.0 - 10.5 K/uL 9.1  10.1  18.4   Hemoglobin 13.0 - 17.0 g/dL 21.3  08.6  57.8   Hematocrit 39.0 - 52.0 % 42.0  41.2  41.8   Platelets 150 - 400 K/uL 385  332  321         Iva Boop, MD, [facility] See AMION on call - gastroenterology for best contact person 09/26/2022 10:26 AM

## 2022-09-27 DIAGNOSIS — A419 Sepsis, unspecified organism: Secondary | ICD-10-CM | POA: Diagnosis not present

## 2022-09-27 LAB — CBC
HCT: 44.4 % (ref 39.0–52.0)
Hemoglobin: 14.9 g/dL (ref 13.0–17.0)
MCH: 30.5 pg (ref 26.0–34.0)
MCHC: 33.6 g/dL (ref 30.0–36.0)
MCV: 91 fL (ref 80.0–100.0)
Platelets: 413 10*3/uL — ABNORMAL HIGH (ref 150–400)
RBC: 4.88 MIL/uL (ref 4.22–5.81)
RDW: 12.5 % (ref 11.5–15.5)
WBC: 7.8 10*3/uL (ref 4.0–10.5)
nRBC: 0 % (ref 0.0–0.2)

## 2022-09-27 LAB — BASIC METABOLIC PANEL
Anion gap: 13 (ref 5–15)
BUN: 16 mg/dL (ref 6–20)
CO2: 23 mmol/L (ref 22–32)
Calcium: 8.7 mg/dL — ABNORMAL LOW (ref 8.9–10.3)
Chloride: 98 mmol/L (ref 98–111)
Creatinine, Ser: 1.22 mg/dL (ref 0.61–1.24)
GFR, Estimated: >60 mL/min (ref >60)
Glucose, Bld: 89 mg/dL (ref 70–99)
Potassium: 3.5 mmol/L (ref 3.5–5.1)
Sodium: 134 mmol/L — ABNORMAL LOW (ref 135–145)

## 2022-09-27 MED ORDER — BUTALBITAL-APAP-CAFFEINE 50-325-40 MG PO TABS: 1.0000 | ORAL_TABLET | Freq: Four times a day (QID) | ORAL | 0 refills | Status: AC | PRN

## 2022-09-27 MED ORDER — AMOXICILLIN-POT CLAVULANATE 875-125 MG PO TABS: 1.0000 | ORAL_TABLET | Freq: Two times a day (BID) | ORAL | 0 refills | Status: AC

## 2022-09-27 MED ORDER — LOSARTAN POTASSIUM 25 MG PO TABS: 50.0000 mg | ORAL_TABLET | Freq: Every day | ORAL | 0 refills | Status: DC

## 2022-09-27 MED ORDER — SENNOSIDES-DOCUSATE SODIUM 8.6-50 MG PO TABS: 2.0000 | ORAL_TABLET | Freq: Every evening | ORAL | Status: DC | PRN

## 2022-09-27 MED ORDER — HYDROCODONE-ACETAMINOPHEN 5-325 MG PO TABS: 1.0000 | ORAL_TABLET | Freq: Four times a day (QID) | ORAL | 0 refills | Status: AC | PRN

## 2022-09-27 MED ORDER — POLYETHYLENE GLYCOL 3350 17 G PO PACK: 17.0000 g | PACK | Freq: Every day | ORAL | 0 refills | Status: DC | PRN

## 2022-09-27 NOTE — Progress Notes (Signed)
Discharge instructions discussed with patient and family, verbalized agreement and understanding 

## 2022-09-27 NOTE — Discharge Summary (Addendum)
Physician Discharge Summary   Patient: Benjamin [name] MRN: [id number] DOB: 08-01-1970  Admit date:     09/22/2022  Discharge date: 09/27/22  Discharge Physician: Briant Cedar   PCP: Ofilia Neas, PA-C   Recommendations at discharge:   Follow-up with PCP in 1 week Follow-up with GI as scheduled Advised to establish care with neurology for management of migraines  Discharge Diagnoses: Principal Problem:   Sepsis [facility]) Active Problems:   Essential hypertension   Diverticulitis   Acute diverticulitis    Hospital Course: Benjamin [name] is a 52 y.o. 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 Reported a sharp stabbing left lower quadrant abdominal pain. No associated fevers, some associated nausea.  Feels similar to his prior diverticulitis. In the ED, was noted to have leukocytosis, and was tachycardic. CT scan of the abdomen showed recurrent acute sigmoid diverticulitis, no evidence of bowel perforation obstruction or abscess. He was given IV ciprofloxacin, IV Flagyl, and admitted to the hospitalist service for further management.     Today, patient reports feeling much better, denies any worsening abdominal pain, nausea/vomiting, fever/chills.  Still with his chronic migraines.  Passing gas and has not had a bowel movements but reports he will do so once he gets home in his own space.  Advised patient to take Fioricet and Norco only as needed to avoid exceeding 4 g of Tylenol as both of them have.  Patient advised to follow-up with neurology for management of migraines, low up with PCP in 1 week and gastroenterology for follow-up colonoscopy    Assessment and Plan:  Sepsis 2/2 acute recurrent sigmoid diverticulitis On presentation, noted to be tachycardic, with leukocytosis- sepsis POA, ruled in source sigmoid diverticulitis   Currently afebrile, with resolved leukocytosis BC x 2 NGTD Procalcitonin 0.40 CT scan of the abdomen showed recurrent acute sigmoid diverticulitis, no evidence of bowel perforation obstruction or abscess Changed to Zosyn on 4/26, switch to p.o. Augmentin to complete 14 days of therapy Pain management, strict bowel regimen (patient states will buy over-the-counter MiraLAX/senna) GI consulted, outpatient follow-up for colonoscopy Advised to avoid NSAIDs for now   Hypokalemia Mild hyponatremia Replaced as needed   Hypertension Uncontrolled likely 2/2 pain Continue diltiazem, losartan PCP may need to adjust BP meds pending uncontrolled BP (which may be contributing to his migraines) Pain management   History of migraines Continues to report persistent headache, likely migraine flare Last seen for migraine by [facility] [place], recommend to follow-up with neurology as patient noted to be consuming lots of Goody powder, NSAIDs, caffeine for control of his migraines Started patient on Fioricet prn   History of GERD PPI   History of factor V Leiden deficiency History of LLE DVT S/p Lovenox while inpatient, advised to ambulate often       Pain control - [employer] Controlled Substance Reporting System database was reviewed. and patient was instructed, not to drive, operate heavy machinery, perform activities at heights, swimming or participation in water activities or provide baby-sitting services while on Pain, Sleep and Anxiety Medications; until their outpatient Physician has advised to do so again. Also recommended to not to take more than prescribed Pain, Sleep and Anxiety Medications.    Consultants: GI Procedures performed: None Disposition: Home Diet recommendation: soft diet Discharge Diet Orders (From admission, onward)     Start     Ordered   09/27/22 0000  Diet -  low sodium heart healthy        09/27/22 1104            DISCHARGE  MEDICATION: Allergies as of 09/27/2022       Reactions   Bismuth Subsalicylate Swelling   Compazine [prochlorperazine Edisylate] Other (See Comments)   Cervical dystonia and panic attack        Medication List     STOP taking these medications    acetaminophen 500 MG tablet Commonly known as: TYLENOL   ibuprofen 200 MG tablet Commonly known as: ADVIL       TAKE these medications    amoxicillin-clavulanate 875-125 MG tablet Commonly known as: AUGMENTIN Take 1 tablet by mouth 2 (two) times daily for 8 days. Start taking on: September 28, 2022   buPROPion 150 MG 24 hr tablet Commonly known as: WELLBUTRIN XL Take 450 mg by mouth daily. What changed: Another medication with the same name was removed. Continue taking this medication, and follow the directions you see here.   butalbital-acetaminophen-caffeine 50-325-40 MG tablet Commonly known as: FIORICET Take 1 tablet by mouth every 6 (six) hours as needed for up to 10 days for headache or migraine.   diltiazem 240 MG 24 hr capsule Commonly known as: CARDIZEM CD TAKE 1 CAPSULE BY MOUTH EVERY DAY What changed: how much to take   esomeprazole 20 MG capsule Commonly known as: NEXIUM Take 40 mg by mouth daily at 12 noon.   fluconazole 150 MG tablet Commonly known as: DIFLUCAN Take 150 mg by mouth once a week.   HYDROcodone-acetaminophen 5-325 MG tablet Commonly known as: NORCO/VICODIN Take 1 tablet by mouth every 6 (six) hours as needed for up to 2 days for moderate pain.   losartan 25 MG tablet Commonly known as: COZAAR Take 2 tablets (50 mg total) by mouth daily. What changed: how much to take   ondansetron 4 MG tablet Commonly known as: ZOFRAN Take 1 tablet (4 mg total) by mouth every 8 (eight) hours as needed for nausea or vomiting.   polyethylene glycol 17 g packet Commonly known as: MIRALAX / GLYCOLAX Take 17 g by mouth daily as needed.   rosuvastatin 40 MG tablet Commonly known as: CRESTOR Take 1  tablet (40 mg total) by mouth once a week. What changed: when to take this   senna-docusate 8.6-50 MG tablet Commonly known as: Senokot-S Take 2 tablets by mouth at bedtime as needed for mild constipation.   tamsulosin 0.4 MG Caps capsule Commonly known as: FLOMAX Take 1 capsule (0.4 mg total) by mouth daily.   valACYclovir 500 MG tablet Commonly known as: VALTREX Take 1 tablet (500 mg total) by mouth daily.        Follow-up Information     Ofilia Neas, PA-C .   Specialty: Urgent Care Contact information: [address] [place] [address] [phone]                Discharge Exam: Ceasar Mons Weights   09/22/22 1141  Weight: 86.2 kg   General: NAD  Cardiovascular: S1, S2 present Respiratory: CTAB Abdomen: Soft, nontender, nondistended, bowel sounds present Musculoskeletal: No bilateral pedal edema noted Skin: Normal Psychiatry: Normal mood   Condition at discharge: stable  The results of significant diagnostics from this hospitalization (including imaging, microbiology, ancillary and laboratory) are listed below for reference.   Imaging Studies: CT ABDOMEN PELVIS W CONTRAST  Result Date: 09/22/2022 CLINICAL DATA:  Abdominal pain, acute, nonlocalized left lower quadrant and  low abdominal pain that began early this morning. Low-grade fever and nausea. EXAM: CT ABDOMEN AND PELVIS WITH CONTRAST TECHNIQUE: Multidetector CT imaging of the abdomen and pelvis was performed using the standard protocol following bolus administration of intravenous contrast. RADIATION DOSE REDUCTION: This exam was performed according to the departmental dose-optimization program which includes automated exposure control, adjustment of the mA and/or kV according to patient size and/or use of iterative reconstruction technique. CONTRAST:  80mL OMNIPAQUE IOHEXOL 300 MG/ML  SOLN COMPARISON:  Abdominopelvic CT 06/24/2020 and 12/09/2019. FINDINGS: Lower chest: Clear lung  bases. No significant pleural or pericardial effusion. Hepatobiliary: The liver is normal in density without suspicious focal abnormality. No evidence of gallstones, gallbladder wall thickening or biliary dilatation. Pancreas: Unremarkable. No pancreatic ductal dilatation or surrounding inflammatory changes. Spleen: Normal in size without focal abnormality. Adrenals/Urinary Tract: Both adrenal glands appear normal. Punctate nonobstructing bilateral renal calculi. No evidence of ureteral calculus, hydronephrosis or perinephric soft tissue stranding. The bladder appears unremarkable for its degree of distention. Stomach/Bowel: No enteric contrast administered. The stomach appears unremarkable for its degree of distention. There is a diverticulum of the 3rd portion of the duodenum. The small bowel, appendix and proximal colon otherwise appear unremarkable. There is scattered distal colonic diverticulosis with recurrent long segment wall thickening of the sigmoid colon with surrounding inflammation consistent with recurrent acute diverticulitis. Location is similar to the previous study. No focal surrounding fluid collection or definite extraluminal air. Vascular/Lymphatic: There are no enlarged abdominal or pelvic lymph nodes. Aortic and branch vessel atherosclerosis without evidence of aneurysm or large vessel occlusion. Reproductive: The prostate gland and seminal vesicles appear normal. Other: Small umbilical hernia containing only fat. There is a small amount of free pelvic fluid. No suspicious peritoneal enhancement or pneumoperitoneum. Musculoskeletal: No acute or significant osseous findings. IMPRESSION: 1. Recurrent acute sigmoid diverticulitis with long segment wall thickening, surrounding inflammation and a small amount of free pelvic fluid. No evidence of bowel perforation, obstruction or abscess. 2. Punctate nonobstructing bilateral renal calculi. 3.  Aortic Atherosclerosis (ICD10-I70.0). Electronically  Signed   By: Carey Bullocks M.D.   On: 09/22/2022 14:00    Microbiology: Results for orders placed or performed during the hospital encounter of 09/22/22  Culture, blood (Routine X 2) w Reflex to ID Panel     Status: None (Preliminary result)   Collection Time: 09/24/22 10:11 AM   Specimen: BLOOD RIGHT ARM  Result Value Ref Range Status   Specimen Description   Final    BLOOD RIGHT ARM Performed at [facility], [address], [place], [place] [address]    Special Requests   Final    BOTTLES DRAWN AEROBIC AND ANAEROBIC Blood Culture adequate volume Performed at [facility], [address], [place], [place] [address]    Culture   Final    NO GROWTH 3 DAYS Performed at [facility], [address], [place], [place] [address]    Report Status PENDING  Incomplete  Culture, blood (Routine X 2) w Reflex to ID Panel     Status: None (Preliminary result)   Collection Time: 09/24/22 10:20 AM   Specimen: BLOOD RIGHT ARM  Result Value Ref Range Status   Specimen Description   Final    BLOOD RIGHT ARM Performed at [facility], [address], [place], [place] [address]    Special Requests   Final    AEROBIC BOTTLE ONLY Blood Culture adequate volume Performed at [facility],  [address] [place], [place] [address]    Culture   Final    NO GROWTH 3 DAYS Performed at [facility], [address], [place], [place] [address]    Report Status PENDING  Incomplete    Labs: CBC: Recent Labs  Lab 09/23/22 0448 09/24/22 0451 09/25/22 0440 09/26/22 0503 09/27/22 0457  WBC 19.3* 18.4* 10.1 9.1 7.8  HGB 15.2 14.1 13.7 13.9 14.9  HCT 44.1 41.8 41.2 42.0 44.4  MCV 90.0 90.5 92.2 91.7 91.0  PLT 332 321 332 385 413*   Basic Metabolic Panel: Recent Labs  Lab 09/23/22 0448 09/24/22 0451 09/25/22 0440 09/26/22 0503 09/27/22 0457  NA 133* 131* 136 134* 134*  K 3.7 3.4* 3.4* 3.6 3.5  CL 100  100 101 102 98  CO2 23 21* 25 21* 23  GLUCOSE 114* 123* 87 80 89  BUN 11 10 11 11 16   CREATININE 0.99 0.94 1.05 0.94 1.22  CALCIUM 8.9 8.5* 8.5* 8.7* 8.7*  MG  --  2.2  --   --   --    Liver Function Tests: Recent Labs  Lab 09/22/22 1145  AST 17  ALT 16  ALKPHOS 77  BILITOT 1.3*  PROT 8.1  ALBUMIN 4.8   CBG: No results for input(s): "GLUCAP" in the last 168 hours.  Discharge time spent: greater than 30 minutes.  Signed: Briant Cedar, MD [facility] 09/27/2022

## 2022-09-28 LAB — CULTURE, BLOOD (ROUTINE X 2): Special Requests: ADEQUATE

## 2022-09-29 LAB — CULTURE, BLOOD (ROUTINE X 2)
Culture: NO GROWTH
Special Requests: ADEQUATE

## 2022-10-04 ENCOUNTER — Encounter (HOSPITAL_BASED_OUTPATIENT_CLINIC_OR_DEPARTMENT_OTHER): Payer: Self-pay | Admitting: Emergency Medicine

## 2022-10-04 ENCOUNTER — Emergency Department (HOSPITAL_BASED_OUTPATIENT_CLINIC_OR_DEPARTMENT_OTHER): Payer: BC Managed Care – PPO

## 2022-10-04 ENCOUNTER — Other Ambulatory Visit: Payer: Self-pay

## 2022-10-04 ENCOUNTER — Emergency Department (HOSPITAL_BASED_OUTPATIENT_CLINIC_OR_DEPARTMENT_OTHER)
Admission: EM | Admit: 2022-10-04 | Discharge: 2022-10-04 | Disposition: A | Discharge status: Home / Self Care | Payer: BC Managed Care – PPO | Attending: Emergency Medicine | Admitting: Emergency Medicine

## 2022-10-04 DIAGNOSIS — D6851 Activated protein C resistance: Secondary | ICD-10-CM | POA: Diagnosis not present

## 2022-10-04 DIAGNOSIS — I82612 Acute embolism and thrombosis of superficial veins of left upper extremity: Secondary | ICD-10-CM | POA: Insufficient documentation

## 2022-10-04 DIAGNOSIS — M79622 Pain in left upper arm: Secondary | ICD-10-CM | POA: Diagnosis present

## 2022-10-04 MED ORDER — RIVAROXABAN 20 MG PO TABS: 20.0000 mg | ORAL_TABLET | Freq: Every day | ORAL | 0 refills | Status: AC

## 2022-10-04 MED ORDER — RIVAROXABAN 15 MG PO TABS
15.0000 mg | ORAL_TABLET | Freq: Once | ORAL | Status: AC
  Administered 2022-10-04: 15 mg via ORAL
  Filled 2022-10-04: qty 1

## 2022-10-04 NOTE — ED Triage Notes (Signed)
Pt was hospitalized last week for diverticulitis. He was dc last Sunday. He had an iv in his left ac . After dc the area was tender. Today his left arm is painful/red and swollen. Does have hx dvt left calf.not on any blood thinners. Does note that he had daily lovenox while in the hospital.

## 2022-10-04 NOTE — ED Provider Notes (Signed)
[place] EMERGENCY DEPARTMENT AT [facility] Provider Note   CSN: [id number] Arrival date & time: 10/04/22  1204     History {Add pertinent medical, surgical, social history, OB history to HPI:1} No chief complaint on file.   Benjamin [name] is a 52 y.o. male.  52 year old male with a history of factor V Leiden who presents emergency department with left upper extremity discomfort.  Says that he was hospitalized and was discharged 1 week ago.  Had an ultrasound in his left upper extremity and has had some pain and a knot in that location.  Says that the knot seems to spread up and down vein in his left arm.  Denies any chest pain or shortness of breath.  No swelling of the arm.  Was previously on Xarelto and then aspirin but has been taken off anticoagulation.       Home Medications Prior to Admission medications   Medication Sig Start Date End Date Taking? Authorizing Provider  amoxicillin-clavulanate (AUGMENTIN) 875-125 MG tablet Take 1 tablet by mouth 2 (two) times daily for 8 days. 09/28/22 10/06/22  Briant Cedar, MD  buPROPion (WELLBUTRIN XL) 150 MG 24 hr tablet Take 450 mg by mouth daily.    [provider]  butalbital-acetaminophen-caffeine (FIORICET) 50-325-40 MG tablet Take 1 tablet by mouth every 6 (six) hours as needed for up to 10 days for headache or migraine. 09/27/22 10/07/22  Briant Cedar, MD  diltiazem (CARDIZEM CD) 240 MG 24 hr capsule TAKE 1 CAPSULE BY MOUTH EVERY DAY Patient taking differently: Take 240 mg by mouth daily. 02/19/21   Patwardhan, Anabel Bene, MD  esomeprazole (NEXIUM) 20 MG capsule Take 40 mg by mouth daily at 12 noon.    [provider]  fluconazole (DIFLUCAN) 150 MG tablet Take 150 mg by mouth once a week.    [provider]  losartan (COZAAR) 25 MG tablet Take 2 tablets (50 mg total) by mouth daily. 09/27/22 10/27/22  Briant Cedar, MD  ondansetron (ZOFRAN) 4 MG tablet Take 1 tablet (4 mg total) by  mouth every 8 (eight) hours as needed for nausea or vomiting. 06/24/20   Haskel Schroeder, PA-C  polyethylene glycol (MIRALAX / GLYCOLAX) 17 g packet Take 17 g by mouth daily as needed. 09/27/22   Briant Cedar, MD  rosuvastatin (CRESTOR) 40 MG tablet Take 1 tablet (40 mg total) by mouth once a week. Patient taking differently: Take 40 mg by mouth every Friday. 12/27/17   Ofilia Neas, PA-C  senna-docusate (SENOKOT-S) 8.6-50 MG tablet Take 2 tablets by mouth at bedtime as needed for mild constipation. 09/27/22   Briant Cedar, MD  tamsulosin (FLOMAX) 0.4 MG CAPS capsule Take 1 capsule (0.4 mg total) by mouth daily. 04/05/18   Benjiman Core D, PA-C  valACYclovir (VALTREX) 500 MG tablet Take 1 tablet (500 mg total) by mouth daily. 12/30/17   Ofilia Neas, PA-C  lisinopril-hydrochlorothiazide (PRINZIDE,ZESTORETIC) 20-25 MG per tablet Take 1 tablet by mouth daily. 07/24/11 08/21/11  Gordy Savers, MD      Allergies    Bismuth subsalicylate and Compazine [prochlorperazine edisylate]    Review of Systems   Review of Systems  Physical Exam Updated Vital Signs BP (!) 145/113 (BP Location: Right Arm)   Pulse 94   Temp 98.5 F (36.9 C) (Oral)   Resp 18   SpO2 100%  Physical Exam Constitutional:      Appearance: Normal appearance.  HENT:  Head: Normocephalic and atraumatic.     Mouth/Throat:     Mouth: Mucous membranes are moist.     Pharynx: Oropharynx is clear.  Cardiovascular:     Rate and Rhythm: Normal rate and regular rhythm.  Pulmonary:     Effort: Pulmonary effort is normal.  Musculoskeletal:     Comments: Palpable cord in left upper extremity  Neurological:     Mental Status: He is alert.     ED Results / Procedures / Treatments   Labs (all labs ordered are listed, but only abnormal results are displayed) Labs Reviewed - No data to display  EKG None  Radiology No results found.  Procedures Procedures   EMERGENCY DEPARTMENT US  EXTREMITY EXAM "Study:  Limited Duplex of left upper extremity superficial veins"  INDICATIONS:  Left arm discomfort Visualization of  regions in transverse plane with full compression visualized.   PERFORMED BY: Myself IMAGES ARCHIVED?: No VIEWS USED: Cephalic vein INTERPRETATION:  Noncompressible cephalic vein for approximately 20 cm.  Veins are compressible on either side of this length of blood vessel.      Medications Ordered in ED Medications - No data to display  ED Course/ Medical Decision Making/ A&P Clinical Course as of 10/04/22 1301  Sun Oct 04, 2022  1255 Discussed with Dr. Mosetta Putt.  Says that typically patients can be treated with aspirin but because his factor V Leiden may require anticoagulation.  Recommends having him on 20 daily until his follow-up with hematology assuming no DVT is found. [RP]    Clinical Course User Index [RP] Rondel Baton, MD   {   Click here for ABCD2, HEART and other calculatorsREFRESH Note before signing :1}                          Medical Decision Making Risk Prescription drug management.   ***  {Document critical care time when appropriate:1} {Document review of labs and clinical decision tools ie heart score, Chads2Vasc2 etc:1}  {Document your independent review of radiology images, and any outside records:1} {Document your discussion with family members, caretakers, and with consultants:1} {Document social determinants of health affecting pt's care:1} {Document your decision making why or why not admission, treatments were needed:1} Final Clinical Impression(s) / ED Diagnoses Final diagnoses:  None    Rx / DC Orders ED Discharge Orders     None

## 2022-10-04 NOTE — Discharge Instructions (Addendum)
You were seen for the blood clot in your arm in the emergency department.   At home, please resume taking the Xarelto that you are on before.    Check your MyChart online for the results of any tests that had not resulted by the time you left the emergency department.   Follow-up with your hematologist in soon as possible regarding your visit.    Return immediately to the emergency department if you experience any of the following: Chest pain, shortness of breath, bleeding, or any other concerning symptoms.    Thank you for visiting our Emergency Department. It was a pleasure taking care of you today.

## 2022-10-07 ENCOUNTER — Ambulatory Visit (INDEPENDENT_AMBULATORY_CARE_PROVIDER_SITE_OTHER): Payer: BC Managed Care – PPO | Admitting: Gastroenterology

## 2022-10-07 ENCOUNTER — Encounter: Payer: Self-pay | Admitting: Gastroenterology

## 2022-10-07 ENCOUNTER — Other Ambulatory Visit (INDEPENDENT_AMBULATORY_CARE_PROVIDER_SITE_OTHER): Payer: BC Managed Care – PPO

## 2022-10-07 ENCOUNTER — Ambulatory Visit (HOSPITAL_COMMUNITY)
Admission: RE | Admit: 2022-10-07 | Discharge: 2022-10-07 | Disposition: A | Discharge status: Home / Self Care | Payer: BC Managed Care – PPO | Source: Ambulatory Visit | Attending: Gastroenterology | Admitting: Gastroenterology

## 2022-10-07 VITALS — BP 132/76 | HR 95 | Ht 69.0 in | Wt 180.0 lb

## 2022-10-07 DIAGNOSIS — K5792 Diverticulitis of intestine, part unspecified, without perforation or abscess without bleeding: Secondary | ICD-10-CM | POA: Diagnosis not present

## 2022-10-07 DIAGNOSIS — K2270 Barrett's esophagus without dysplasia: Secondary | ICD-10-CM

## 2022-10-07 DIAGNOSIS — Z7901 Long term (current) use of anticoagulants: Secondary | ICD-10-CM

## 2022-10-07 LAB — COMPREHENSIVE METABOLIC PANEL
ALT: 17 U/L (ref 0–53)
AST: 14 U/L (ref 0–37)
Albumin: 4.4 g/dL (ref 3.5–5.2)
Alkaline Phosphatase: 92 U/L (ref 39–117)
BUN: 14 mg/dL (ref 6–23)
CO2: 26 mEq/L (ref 19–32)
Calcium: 9.6 mg/dL (ref 8.4–10.5)
Chloride: 100 mEq/L (ref 96–112)
Creatinine, Ser: 1.27 mg/dL (ref 0.40–1.50)
GFR: 64.68 mL/min (ref >60.00)
Glucose, Bld: 91 mg/dL (ref 70–99)
Potassium: 3.6 mEq/L (ref 3.5–5.1)
Sodium: 136 mEq/L (ref 135–145)
Total Bilirubin: 0.6 mg/dL (ref 0.2–1.2)
Total Protein: 8.1 g/dL (ref 6.0–8.3)

## 2022-10-07 LAB — CBC WITH DIFFERENTIAL/PLATELET
Basophils Absolute: 0.1 10*3/uL (ref 0.0–0.1)
Basophils Relative: 1.1 % (ref 0.0–3.0)
Eosinophils Absolute: 0.2 10*3/uL (ref 0.0–0.7)
Eosinophils Relative: 1.8 % (ref 0.0–5.0)
HCT: 43.3 % (ref 39.0–52.0)
Hemoglobin: 14.6 g/dL (ref 13.0–17.0)
Lymphocytes Relative: 31.1 % (ref 12.0–46.0)
Lymphs Abs: 3.4 10*3/uL (ref 0.7–4.0)
MCHC: 33.8 g/dL (ref 30.0–36.0)
MCV: 90.3 fl (ref 78.0–100.0)
Monocytes Absolute: 0.7 10*3/uL (ref 0.1–1.0)
Monocytes Relative: 6.3 % (ref 3.0–12.0)
Neutro Abs: 6.5 10*3/uL (ref 1.4–7.7)
Neutrophils Relative %: 59.7 % (ref 43.0–77.0)
Platelets: 644 10*3/uL — ABNORMAL HIGH (ref 150.0–400.0)
RBC: 4.79 Mil/uL (ref 4.22–5.81)
RDW: 13.3 % (ref 11.5–15.5)
WBC: 10.9 10*3/uL — ABNORMAL HIGH (ref 4.0–10.5)

## 2022-10-07 LAB — LIPASE: Lipase: 2 U/L — ABNORMAL LOW (ref 11.0–59.0)

## 2022-10-07 MED ORDER — IOHEXOL 350 MG/ML SOLN
75.0000 mL | Freq: Once | INTRAVENOUS | Status: AC | PRN
  Administered 2022-10-07: 75 mL via INTRAVENOUS

## 2022-10-07 MED ORDER — DICYCLOMINE HCL 10 MG PO CAPS: 10.0000 mg | ORAL_CAPSULE | Freq: Three times a day (TID) | ORAL | 11 refills | Status: DC

## 2022-10-07 MED ORDER — CIPROFLOXACIN HCL 500 MG PO TABS: 500.0000 mg | ORAL_TABLET | Freq: Two times a day (BID) | ORAL | 0 refills | Status: AC

## 2022-10-07 MED ORDER — METRONIDAZOLE 500 MG PO TABS: 500.0000 mg | ORAL_TABLET | Freq: Two times a day (BID) | ORAL | 0 refills | Status: AC

## 2022-10-07 NOTE — Patient Instructions (Signed)
Your provider has requested that you go to the basement level for lab work before leaving today. Press "B" on the elevator. The lab is located at the first door on the left as you exit the elevator.  We have sent the following medications to your pharmacy for you to pick up at your convenience: Cipro and Flagyl to take 1 tablet by mouth twice daily x 14 days. Also, dicyclomine to take 1 capsule by mouth four times a day for abdominal pain.  We have sent a referral to Dr. Gustavo Lah office. If you have not heard from them regarding your referral then please contact their office. They do have the referral.   You have been scheduled for a CT scan of the abdomen and pelvis at [facility], 1st floor Radiology. You are scheduled on 10/07/22 at 2:00pm. PLEASE GO STRAIGHT OVER TO RADIOLOGY NOW FOR THE SCAN.   The purpose of you drinking the oral contrast is to aid in the visualization of your intestinal tract. The contrast solution may cause some diarrhea. Depending on your individual set of symptoms, you may also receive an intravenous injection of x-ray contrast/dye. Plan on being at [facility] for 45 minutes or longer, depending on the type of exam you are having performed.   If you have any questions regarding your exam or if you need to reschedule, you may call  [phone] between the hours of 8:00 am and 5:00 pm, Monday-Friday.   The [facility] providers would like to encourage you to use [facility] to communicate with providers for non-urgent requests or questions.  Due to long hold times on the telephone, sending your provider a message by [facility] may be a faster and more efficient way to get a response.  Please allow 48 business hours for a response.  Please remember that this is for non-urgent requests.   Due to recent changes in healthcare laws, you may see the results of your imaging and laboratory studies on MyChart before your provider has had a chance to review them.  We understand that in  some cases there may be results that are confusing or concerning to you. Not all laboratory results come back in the same time frame and the provider may be waiting for multiple results in order to interpret others.  Please give [place] 48 hours in order for your provider to thoroughly review all the results before contacting the office for clarification of your results.   Thank you for choosing me and [facility].  Venita Lick. Pleas Koch., MD., Clementeen Graham

## 2022-10-07 NOTE — Progress Notes (Signed)
    Assessment     Recent acute diverticulitis now with recurrent symptoms Personal history of adenomatous colon polyps GERD, Barrett's esophagus short segment without dysplasia Leiden factor V deficiency with history of DVT, with new LUE DVT on Xarelto   Recommendations    CBC, CMP, lipase  CT AP scheduled this afternoon  Discontinue Bactrim and Augmentin Begin Cipro 500 mg bid and Flagyl 500 mg bid for 2 weeks Begin dicyclomine 10 mg 4 times daily as needed abdominal pain May need hospitalization depending on pain control and CT findings Referral to Dr. Myna Hidalgo for Leyden factor V deficiency, new DVT management Colonoscopy and EGD electively about 6 weeks after diverticulitis has resolved REV in 1 month   HPI    This is a 52 year old male hospitalized with acute diverticulitis in late April. Treated with IV cipro and IV flagyl and then treated with Augmentin since discharge. Symptoms had resolved and he had returned to work.  Unfortunately yesterday he developed recurrent moderate left lower quadrant abdominal pain.  He has been taking hydrocodone for pain control.  He began Bactrim in addition to Augmentin at the advice of his PCP.  He has not noted fevers chills bowel habit changes.  He was diagnosed with a left upper extremity DVT and placed on Xarelto on May 5th.   Labs / Imaging       Latest Ref Rng & Units 09/22/2022   11:45 AM 06/24/2020   11:39 AM 12/08/2019   11:21 PM  Hepatic Function  Total Protein 6.5 - 8.1 g/dL 8.1  7.9  7.5   Albumin 3.5 - 5.0 g/dL 4.8  4.2  4.1   AST 15 - 41 U/L 17  19  22    ALT 0 - 44 U/L 16  22  20    Alk Phosphatase 38 - 126 U/L 77  59  68   Total Bilirubin 0.3 - 1.2 mg/dL 1.3  1.0  0.6        Latest Ref Rng & Units 09/27/2022    4:57 AM 09/26/2022    5:03 AM 09/25/2022    4:40 AM  CBC  WBC 4.0 - 10.5 K/uL 7.8  9.1  10.1   Hemoglobin 13.0 - 17.0 g/dL 09.8  11.9  14.7   Hematocrit 39.0 - 52.0 % 44.4  42.0  41.2   Platelets 150 - 400 K/uL  413  385  332    Current Medications, Allergies, Past Medical History, Past Surgical History, Family History and Social History were reviewed in [employer] record.   Physical Exam: General: Well developed, well nourished, appears uncomfortable Head: Normocephalic and atraumatic Eyes: Sclerae anicteric, EOMI Ears: Normal auditory acuity Mouth: No deformities or lesions noted Lungs: Clear throughout to auscultation Heart: Regular rate and rhythm; No murmurs, rubs or bruits Abdomen: Soft, moderate LLQ tenderness to palpation and non distended. No masses, hepatosplenomegaly or hernias noted. Normal Bowel sounds Rectal: Not done Musculoskeletal: Symmetrical with no gross deformities  Pulses:  Normal pulses noted Extremities: No edema or deformities noted Neurological: Alert oriented x 4, grossly nonfocal Psychological:  Alert and cooperative. Normal mood and affect   Manon Banbury T. Russella Dar, MD 10/07/2022, 11:17 AM

## 2022-10-27 ENCOUNTER — Inpatient Hospital Stay (HOSPITAL_BASED_OUTPATIENT_CLINIC_OR_DEPARTMENT_OTHER): Payer: BC Managed Care – PPO | Admitting: Hematology & Oncology

## 2022-10-27 ENCOUNTER — Encounter: Payer: Self-pay | Admitting: Hematology & Oncology

## 2022-10-27 ENCOUNTER — Inpatient Hospital Stay: Payer: BC Managed Care – PPO | Attending: Hematology & Oncology

## 2022-10-27 ENCOUNTER — Other Ambulatory Visit: Payer: Self-pay | Admitting: Family

## 2022-10-27 VITALS — BP 134/108 | HR 88 | Temp 98.1°F | Resp 17 | Wt 183.0 lb

## 2022-10-27 DIAGNOSIS — D6851 Activated protein C resistance: Secondary | ICD-10-CM | POA: Diagnosis present

## 2022-10-27 DIAGNOSIS — I82442 Acute embolism and thrombosis of left tibial vein: Secondary | ICD-10-CM

## 2022-10-27 DIAGNOSIS — Z86718 Personal history of other venous thrombosis and embolism: Secondary | ICD-10-CM | POA: Insufficient documentation

## 2022-10-27 DIAGNOSIS — I808 Phlebitis and thrombophlebitis of other sites: Secondary | ICD-10-CM | POA: Insufficient documentation

## 2022-10-27 DIAGNOSIS — Z7901 Long term (current) use of anticoagulants: Secondary | ICD-10-CM | POA: Insufficient documentation

## 2022-10-27 DIAGNOSIS — T801XXA Vascular complications following infusion, transfusion and therapeutic injection, initial encounter: Secondary | ICD-10-CM | POA: Diagnosis not present

## 2022-10-27 DIAGNOSIS — I82612 Acute embolism and thrombosis of superficial veins of left upper extremity: Secondary | ICD-10-CM

## 2022-10-27 LAB — CMP (CANCER CENTER ONLY)
ALT: 15 U/L (ref 0–44)
AST: 16 U/L (ref 15–41)
Albumin: 4.7 g/dL (ref 3.5–5.0)
Alkaline Phosphatase: 81 U/L (ref 38–126)
Anion gap: 8 (ref 5–15)
BUN: 16 mg/dL (ref 6–20)
CO2: 27 mmol/L (ref 22–32)
Calcium: 9.8 mg/dL (ref 8.9–10.3)
Chloride: 104 mmol/L (ref 98–111)
Creatinine: 1.11 mg/dL (ref 0.61–1.24)
GFR, Estimated: >60 mL/min (ref >60)
Glucose, Bld: 108 mg/dL — ABNORMAL HIGH (ref 70–99)
Potassium: 3.6 mmol/L (ref 3.5–5.1)
Sodium: 139 mmol/L (ref 135–145)
Total Bilirubin: 0.6 mg/dL (ref 0.3–1.2)
Total Protein: 7.2 g/dL (ref 6.5–8.1)

## 2022-10-27 LAB — CBC WITH DIFFERENTIAL (CANCER CENTER ONLY)
Abs Immature Granulocytes: 0.04 10*3/uL (ref 0.00–0.07)
Basophils Absolute: 0.1 10*3/uL (ref 0.0–0.1)
Basophils Relative: 2 %
Eosinophils Absolute: 0.2 10*3/uL (ref 0.0–0.5)
Eosinophils Relative: 3 %
HCT: 42.8 % (ref 39.0–52.0)
Hemoglobin: 14.4 g/dL (ref 13.0–17.0)
Immature Granulocytes: 1 %
Lymphocytes Relative: 36 %
Lymphs Abs: 2.6 10*3/uL (ref 0.7–4.0)
MCH: 30.3 pg (ref 26.0–34.0)
MCHC: 33.6 g/dL (ref 30.0–36.0)
MCV: 89.9 fL (ref 80.0–100.0)
Monocytes Absolute: 0.5 10*3/uL (ref 0.1–1.0)
Monocytes Relative: 7 %
Neutro Abs: 3.7 10*3/uL (ref 1.7–7.7)
Neutrophils Relative %: 51 %
Platelet Count: 258 10*3/uL (ref 150–400)
RBC: 4.76 MIL/uL (ref 4.22–5.81)
RDW: 12.6 % (ref 11.5–15.5)
WBC Count: 7.3 10*3/uL (ref 4.0–10.5)
nRBC: 0 % (ref 0.0–0.2)

## 2022-10-27 LAB — D-DIMER, QUANTITATIVE: D-Dimer, Quant: <0.27 ug/mL-FEU (ref 0.00–0.50)

## 2022-10-27 LAB — LACTATE DEHYDROGENASE: LDH: 123 U/L (ref 98–192)

## 2022-10-27 NOTE — Progress Notes (Signed)
Referral MD  Reason for Referral: Superficial thrombus of left cephalic vein -factor V Leiden deficiency -heterozygote  Chief Complaint  Patient presents with   New Patient (Initial Visit)  : Have a blood clot in my left arm.  HPI: Benjamin [name] is a very nice 52 year old white male.  He has she has been seen here before.  The last time that we saw him was back in 2019.  At that time, he had a clot in the left posterior tibial vein.  He was diagnosed with the factor V Leiden mutation.  He was on Xarelto.  I think he is on Xarelto for a year.  He has been doing okay.  Recently, he was hospitalized because of diverticulitis.  He had an IV in his left arm.  He was given IV fluids and IV antibiotics.  He subsequently developed some swelling in the left arm.  He had a Doppler that was done.  This unfortunately showed a superficial thrombus in the cephalic from the forearm to the distal humerus.  He is on Xarelto right now.  He is doing well with Xarelto.  He has a little bit of pain at the elbow joint.  He does not smoke.  He has been quite active.  He travels for his job.  He has had no cough or shortness of breath.  He has not had any probably COVID recently.  He has had no problems with fever.  He has had no change in bowel or bladder habits.  Of note, he had a CT of the abdomen and pelvis on 10/07/2022.  This patient shows sigmoid diverticulitis.  His prostate was normal in size.  Currently, I would have to say that his performance status is probably ECOG 0.    Past Medical History:  Diagnosis Date   Acute DVT of left tibial vein (HCC) 04/24/2017   Xarelto started (dx'd in emergency dept).  Dr. Myna Hidalgo recommended full dose xarelto x 56mo, with an additional year of maintenance dose xarelto (as of 05/2017 initial consult note).  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  Pt Factor V leiden heterozygote.  Stable as of 09/2017 hem f/u.   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  Pt referred for sleep study.   Chronic renal insufficiency, stage II (mild)    GFR 60s-70s (suspect HTN and NSAID damage as etiology).  GFR dipped into 50s fall 2018 after lots of NSAID use for chronic daily HA's.   Diverticulitis 2016   CT 01/2015   Frequent headaches    Gastric polyp 2006   GERD without esophagitis    Herpes labialis    Hiatal hernia    Hyperlipidemia    HYPERTENSION 01/05/2007   OSA (obstructive sleep apnea) 04/2017   Dr. Frances Furbish eval 04/2017-plan for sleep study.   PSORIASIS 01/05/2007  :   Past Surgical History:  Procedure Laterality Date  COLONOSCOPY W/ POLYPECTOMY  10/04/15   7mm tubular adenoma + diverticulosis: recall 5 yrs (Dr. Russella Dar)   ESOPHAGOGASTRODUODENOSCOPY  04/12/14   Barrett's esophagus   LIPOMA EXCISION     several, arm  :   Current Outpatient Medications:    buPROPion (WELLBUTRIN XL) 150 MG 24 hr tablet, Take 450 mg by mouth daily., Disp: , Rfl:    dicyclomine (BENTYL) 10 MG capsule, Take 1 capsule (10 mg total) by mouth 4 (four) times daily -  before meals and at bedtime., Disp: 120 capsule, Rfl: 11   diltiazem (CARDIZEM CD) 240 MG 24 hr capsule, TAKE 1 CAPSULE BY MOUTH EVERY DAY (Patient taking differently: Take 240 mg by mouth daily.), Disp: 90 capsule, Rfl: 1   esomeprazole (NEXIUM) 20 MG capsule, Take 40 mg by mouth daily at 12 noon., Disp: , Rfl:     HYDROcodone-acetaminophen (NORCO/VICODIN) 5-325 MG tablet, Take 1 tablet by mouth every 6 (six) hours as needed., Disp: , Rfl:    losartan (COZAAR) 25 MG tablet, Take 2 tablets (50 mg total) by mouth daily., Disp: 60 tablet, Rfl: 0   ondansetron (ZOFRAN) 4 MG tablet, Take 1 tablet (4 mg total) by mouth every 8 (eight) hours as needed for nausea or vomiting., Disp: 12 tablet, Rfl: 0   polyethylene glycol (MIRALAX / GLYCOLAX) 17 g packet, Take 17 g by mouth daily as needed., Disp: 14 each, Rfl: 0   rivaroxaban (XARELTO) 20 MG TABS tablet, Take 1 tablet (20 mg total) by mouth daily with supper., Disp: 30 tablet, Rfl: 0   rosuvastatin (CRESTOR) 40 MG tablet, Take 1 tablet (40 mg total) by mouth once a week. (Patient taking differently: Take 40 mg by mouth every Friday.), Disp: 4 tablet, Rfl: 12   tamsulosin (FLOMAX) 0.4 MG CAPS capsule, Take 1 capsule (0.4 mg total) by mouth daily., Disp: 90 capsule, Rfl: 1   tiZANidine (ZANAFLEX) 4 MG tablet, Take 4 mg by mouth as directed., Disp: , Rfl:    valACYclovir (VALTREX) 500 MG tablet, Take 1 tablet (500 mg total) by mouth daily., Disp: 30 tablet, Rfl: 12   fluconazole (DIFLUCAN) 150 MG tablet, Take 150 mg by mouth once a week. (Patient not taking: Reported on 10/27/2022), Disp: , Rfl:    senna-docusate (SENOKOT-S) 8.6-50 MG tablet, Take 2 tablets by mouth at bedtime as needed for mild constipation. (Patient not taking: Reported on 10/27/2022), Disp: , Rfl:    sulfamethoxazole-trimethoprim (BACTRIM DS) 800-160 MG tablet, Take 1 tablet by mouth once. (Patient not taking: Reported on 10/27/2022), Disp: , Rfl:  No current facility-administered medications for this visit.  Facility-Administered Medications Ordered in Other Visits:    gadopentetate dimeglumine (MAGNEVIST) injection 18 mL, 18 mL, Intravenous, Once PRN, Anson Fret, MD:  :   Allergies  Allergen Reactions   Bismuth Subsalicylate Swelling and Other (See Comments)   Compazine [Prochlorperazine  Edisylate] Other (See Comments)    Cervical dystonia and panic attack  :   Family History  Problem Relation Age of Onset   CAD Father        around age 51   Heart disease Father    Hyperlipidemia Father    Hypertension Father    CAD Paternal Grandmother    Diabetes Paternal Grandmother    Hypertension Mother    Hypertension Brother    Hyperlipidemia Brother    Lung cancer Maternal Grandmother    Cancer Maternal Grandfather        type unknown   Colon cancer Other  neg hx   Prostate cancer Other        neg hx  :   Social History   Socioeconomic History   Marital status: Single    Spouse name: Not on file   Number of children: 3   Years of education: Not on file   Highest education level: Not on file  Occupational History   Occupation: regional Mudlogger    Comment: Industrial/product designer  Tobacco Use   Smoking status: Never   Smokeless tobacco: Never  Vaping Use   Vaping Use: Never used  Substance and Sexual Activity   Alcohol use: Yes    Alcohol/week: 2.0 standard drinks of alcohol    Types: 2 Cans of beer per week    Comment: rare   Drug use: No   Sexual activity: Yes    Birth control/protection: None  Other Topics Concern   Not on file  Social History Narrative   Currently separated as of 01/2014, 2 children.  One younger brother.   Occupation: Airline pilot for [employer]   No tobacco.   Occ alcohol (1-2 x/month).  No hx of alc/drug prob.   Exercise: runs about 2 times a week. Not as much lately due to medical state   Diet: regular american diet.   Right handed   1 cup coffee daily      Social Determinants of Health   Financial Resource Strain: Not on file  Food Insecurity: No Food Insecurity (09/22/2022)   Hunger Vital Sign    Worried About Running Out of Food in the Last Year: Never true    Ran Out of Food in the Last Year: Never true  Transportation Needs: No Transportation Needs (09/22/2022)   PRAPARE - Scientist, research (physical sciences) (Medical): No    Lack of Transportation (Non-Medical): No  Physical Activity: Not on file  Stress: Not on file  Social Connections: Not on file  Intimate Partner Violence: Not At Risk (09/22/2022)   Humiliation, Afraid, Rape, and Kick questionnaire    Fear of Current or Ex-Partner: No    Emotionally Abused: No    Physically Abused: No    Sexually Abused: No  :  Review of Systems  Constitutional: Negative.   HENT: Negative.    Eyes: Negative.   Respiratory: Negative.    Cardiovascular: Negative.   Gastrointestinal: Negative.   Genitourinary: Negative.   Musculoskeletal: Negative.   Skin: Negative.   Neurological: Negative.   Endo/Heme/Allergies: Negative.   Psychiatric/Behavioral: Negative.       Exam: Vital signs show temperature of 98.1.  Pulse 88.  Blood pressure 134/108.  Weight is 183 pounds.  @IPVITALS @ Physical Exam Vitals reviewed.  HENT:     Head: Normocephalic and atraumatic.  Eyes:     Pupils: Pupils are equal, round, and reactive to light.  Cardiovascular:     Rate and Rhythm: Normal rate and regular rhythm.     Heart sounds: Normal heart sounds.  Pulmonary:     Effort: Pulmonary effort is normal.     Breath sounds: Normal breath sounds.  Abdominal:     General: Bowel sounds are normal.     Palpations: Abdomen is soft.  Musculoskeletal:        General: No tenderness or deformity. Normal range of motion.     Cervical back: Normal range of motion.     Comments: His extremities shows minimal swelling in the left arm.  There may be a little bit of thrombophlebitis  at the elbow joint.  There is good pulses.  He has no erythema.  Lymphadenopathy:     Cervical: No cervical adenopathy.  Skin:    General: Skin is warm and dry.     Findings: No erythema or rash.  Neurological:     Mental Status: He is alert and oriented to person, place, and time.  Psychiatric:        Behavior: Behavior normal.        Thought Content: Thought content normal.         Judgment: Judgment normal.     Recent Labs    10/27/22 1022  WBC 7.3  HGB 14.4  HCT 42.8  PLT 258    Recent Labs    10/27/22 1022  NA 139  K 3.6  CL 104  CO2 27  GLUCOSE 108*  BUN 16  CREATININE 1.11  CALCIUM 9.8    Blood smear review: None  Pathology: None    Assessment and Plan: [name] is a very nice 52 year old white male.  He is heterozygous for the factor V Leiden mutation.  He now has a superficial thrombus in the left arm.  This is where he had an IV.  I realize that he does have the factor V Leiden mutation.  This is a superficial thrombus.  Hopefully, we can avoid having to commit him to lifelong anticoagulation.  I think that we probably will need 6 months of full dose Xarelto.  Then, we probably use maintenance Xarelto for 6 months.  Hopefully after that, we might be able to get him on aspirin.  Again, I do not think we have to commit him to lifelong anticoagulation.  I will like to see him back in a couple months.  When we see him back, we will do a Doppler of his left arm and see how that thrombus has improved.  His legs look okay.  I really cannot find anything unusual with his legs with respect to any thromboembolic disease.

## 2022-11-11 ENCOUNTER — Encounter: Payer: Self-pay | Admitting: Gastroenterology

## 2022-11-11 ENCOUNTER — Telehealth: Payer: Self-pay

## 2022-11-11 ENCOUNTER — Ambulatory Visit (INDEPENDENT_AMBULATORY_CARE_PROVIDER_SITE_OTHER): Payer: BC Managed Care – PPO | Admitting: Gastroenterology

## 2022-11-11 VITALS — BP 120/88 | HR 91 | Ht 69.0 in | Wt 183.4 lb

## 2022-11-11 DIAGNOSIS — K2270 Barrett's esophagus without dysplasia: Secondary | ICD-10-CM

## 2022-11-11 DIAGNOSIS — K5732 Diverticulitis of large intestine without perforation or abscess without bleeding: Secondary | ICD-10-CM

## 2022-11-11 DIAGNOSIS — R933 Abnormal findings on diagnostic imaging of other parts of digestive tract: Secondary | ICD-10-CM | POA: Diagnosis not present

## 2022-11-11 MED ORDER — NA SULFATE-K SULFATE-MG SULF 17.5-3.13-1.6 GM/177ML PO SOLN: 1.0000 | Freq: Once | ORAL | 0 refills | Status: AC

## 2022-11-11 NOTE — Telephone Encounter (Signed)
[facility] Pre-operative Risk Assessment     Request for surgical clearance:     Endoscopy Procedure  What type of surgery is being performed?     EGD/COLONOSCOPY  When is this surgery scheduled?     12-28-22  What type of clearance is required ?   Pharmacy  Are there any medications that need to be held prior to surgery and how long? XARELTO 2 DAYS  Practice name and name of physician performing surgery?   [facility]  What is your office phone and fax number?      Phone- [phone]  Fax- [phone]  ATTN: Berlinda Last, CMA  Anesthesia type (None, local, MAC, general) ?       MAC   THANK YOU

## 2022-11-11 NOTE — Telephone Encounter (Signed)
Please ignore this request. Sent in error. (Xarelto is managed by Dr. Myna Hidalgo).

## 2022-11-11 NOTE — Telephone Encounter (Signed)
   Benjamin [name] 31-Jul-1970 [id number]  Dear Dr. Myna Hidalgo:  We have scheduled the above named patient for a(n) EGD/ Colonoscopy procedure with Dr. Claudette Head. Our records show that he is on anticoagulation therapy.  Please advise as to whether the patient may come off their therapy of Xarelto 2 days prior to their procedure which is scheduled for 12-28-22.  Please route your response to Berlinda Last, CMA or fax response to [phone]. Thank you.  Sincerely,    [facility]

## 2022-11-11 NOTE — Patient Instructions (Addendum)
If your blood pressure at your visit was 140/90 or greater, please contact your primary care physician to follow up on this. ______________________________________________________  If you are age 53 or older, your body mass index should be between 23-30. Your Body mass index is 27.08 kg/m. If this is out of the aforementioned range listed, please consider follow up with your Primary Care Provider.  If you are age 77 or younger, your body mass index should be between 19-25. Your Body mass index is 27.08 kg/m. If this is out of the aformentioned range listed, please consider follow up with your Primary Care Provider.  ________________________________________________________  The [facility] providers would like to encourage you to use [facility] to communicate with providers for non-urgent requests or questions.  Due to long hold times on the telephone, sending your provider a message by [facility] may be a faster and more efficient way to get a response.  Please allow 48 business hours for a response.  Please remember that this is for non-urgent requests.  _______________________________________________________  Due to recent changes in healthcare laws, you may see the results of your imaging and laboratory studies on MyChart before your provider has had a chance to review them.  We understand that in some cases there may be results that are confusing or concerning to you. Not all laboratory results come back in the same time frame and the provider may be waiting for multiple results in order to interpret others.  Please give [place] 48 hours in order for your provider to thoroughly review all the results before contacting the office for clarification of your results.   You have been scheduled for an endoscopy and colonoscopy. Please follow the written instructions given to you at your visit today. Please pick up your prep supplies at the pharmacy within the next 1-3 days. If you use inhalers (even only as  needed), please bring them with you on the day of your procedure.   Thank you for choosing me and [facility].  Venita Lick. Pleas Koch., MD., Clementeen Graham

## 2022-11-11 NOTE — Progress Notes (Signed)
Assessment     Recurrent acute diverticulitis, resolved Personal history of adenomatous colon polyps GERD, Barrett's esophagus short segment without dysplasia Leiden factor V deficiency with history of DVT, with new LUE DVT on Xarelto   Recommendations    Continue dicyclomine 10 mg 4 times daily prn abdominal pain Continue Nexium 40 mg qd, follow antireflux measures Schedule colonoscopy and EGD. The risks (including bleeding, perforation, infection, missed lesions, medication reactions and possible hospitalization or surgery if complications occur), benefits, and alternatives to colonoscopy with possible biopsy and possible polypectomy were discussed with the patient and they consent to proceed.  The risks (including bleeding, perforation, infection, missed lesions, medication reactions and possible hospitalization or surgery if complications occur), benefits, and alternatives to endoscopy with possible biopsy and possible dilation were discussed with the patient and they consent to proceed.   Hold Xarelto  2 days before procedure - will instruct when and how to resume after procedure. Low but real risk of cardiovascular event such as heart attack, stroke, embolism, thrombosis or ischemia/infarct of other organs off Xarelto explained and need to seek urgent help if this occurs. The patient consents to proceed. Will communicate by phone or EMR with patient's prescribing provider to confirm that holding Xarelto is reasonable in this case.     HPI    This is a 52 year old male with recurrent diverticulitis which has resolved. He has mild intermittent LLQ pain, tenderness that responds to dicyclomine. His reflux symptoms are well controlled.  He was evaluated by Dr. Myna Hidalgo and Xarelto has been continued.   Labs / Imaging       Latest Ref Rng & Units 10/27/2022   10:22 AM 10/07/2022   11:55 AM 09/22/2022   11:45 AM  Hepatic Function  Total Protein 6.5 - 8.1 g/dL 7.2  8.1  8.1   Albumin  3.5 - 5.0 g/dL 4.7  4.4  4.8   AST 15 - 41 U/L 16  14  17    ALT 0 - 44 U/L 15  17  16    Alk Phosphatase 38 - 126 U/L 81  92  77   Total Bilirubin 0.3 - 1.2 mg/dL 0.6  0.6  1.3        Latest Ref Rng & Units 10/27/2022   10:22 AM 10/07/2022   11:55 AM 09/27/2022    4:57 AM  CBC  WBC 4.0 - 10.5 K/uL 7.3  10.9  7.8   Hemoglobin 13.0 - 17.0 g/dL 72.6  20.3  55.9   Hematocrit 39.0 - 52.0 % 42.8  43.3  44.4   Platelets 150 - 400 K/uL 258  644.0  413      CT Abdomen Pelvis W Contrast CLINICAL DATA:  Diverticulitis.  EXAM: CT ABDOMEN AND PELVIS WITH CONTRAST  TECHNIQUE: Multidetector CT imaging of the abdomen and pelvis was performed using the standard protocol following bolus administration of intravenous contrast.  RADIATION DOSE REDUCTION: This exam was performed according to the departmental dose-optimization program which includes automated exposure control, adjustment of the mA and/or kV according to patient size and/or use of iterative reconstruction technique.  CONTRAST:  75mL OMNIPAQUE IOHEXOL 350 MG/ML SOLN  COMPARISON:  09/22/2022.  FINDINGS: Lower chest: Lung bases are clear. Heart size normal. No pericardial or pleural effusion. Distal esophagus is grossly unremarkable.  Hepatobiliary: Liver and gallbladder are unremarkable. No biliary ductal dilatation.  Pancreas: Negative.  Spleen: Negative.  Adrenals/Urinary Tract: Tiny renal stones bilaterally. Subcentimeter low-attenuation lesion in the left kidney,  too small to characterize. No specific follow-up necessary. Kidneys are otherwise unremarkable. Ureters are decompressed. Bladder is grossly unremarkable.  Stomach/Bowel: Stomach, small bowel, appendix and majority of the colon are unremarkable. Persistent wall thickening and pericolonic inflammatory haziness involving the sigmoid colon. No extraluminal air or organized fluid collection.  Vascular/Lymphatic: Atherosclerotic calcification of the aorta.  No pathologically enlarged lymph nodes.  Reproductive: Prostate is normal in size.  Other: No free fluid. Mesenteries and peritoneum are otherwise unremarkable.  Musculoskeletal: None.  IMPRESSION: 1. Sigmoid diverticulitis without perforation or abscess. Findings are similar to minimally improved from 09/22/2022. 2. Tiny renal stones. 3.  Aortic atherosclerosis (ICD10-I70.0).  Electronically Signed   By: Leanna Battles M.D.   On: 10/07/2022 15:13   Current Medications, Allergies, Past Medical History, Past Surgical History, Family History and Social History were reviewed in [employer] record.   Physical Exam: General: Well developed, well nourished, no acute distress Head: Normocephalic and atraumatic Eyes: Sclerae anicteric, EOMI Ears: Normal auditory acuity Mouth: No deformities or lesions noted Lungs: Clear throughout to auscultation Heart: Regular rate and rhythm; No murmurs, rubs or bruits Abdomen: Soft, non tender and non distended. No masses, hepatosplenomegaly or hernias noted. Normal Bowel sounds Rectal: Musculoskeletal: Symmetrical with no gross deformities  Pulses:  Normal pulses noted Extremities: No edema or deformities noted Neurological: Alert oriented x 4, grossly nonfocal Psychological:  Alert and cooperative. Normal mood and affect   Carmisha Larusso T. Russella Dar, MD 11/11/2022, 11:21 AM

## 2022-11-14 ENCOUNTER — Telehealth (HOSPITAL_BASED_OUTPATIENT_CLINIC_OR_DEPARTMENT_OTHER): Payer: Self-pay | Admitting: Emergency Medicine

## 2022-11-14 ENCOUNTER — Other Ambulatory Visit (HOSPITAL_BASED_OUTPATIENT_CLINIC_OR_DEPARTMENT_OTHER): Payer: Self-pay

## 2022-11-14 ENCOUNTER — Emergency Department (HOSPITAL_BASED_OUTPATIENT_CLINIC_OR_DEPARTMENT_OTHER)
Admission: EM | Admit: 2022-11-14 | Discharge: 2022-11-14 | Disposition: A | Discharge status: Home / Self Care | Payer: BC Managed Care – PPO | Attending: Emergency Medicine | Admitting: Emergency Medicine

## 2022-11-14 ENCOUNTER — Other Ambulatory Visit: Payer: Self-pay

## 2022-11-14 ENCOUNTER — Emergency Department (HOSPITAL_BASED_OUTPATIENT_CLINIC_OR_DEPARTMENT_OTHER): Payer: BC Managed Care – PPO

## 2022-11-14 ENCOUNTER — Encounter (HOSPITAL_BASED_OUTPATIENT_CLINIC_OR_DEPARTMENT_OTHER): Payer: Self-pay | Admitting: Emergency Medicine

## 2022-11-14 DIAGNOSIS — Z79899 Other long term (current) drug therapy: Secondary | ICD-10-CM | POA: Diagnosis not present

## 2022-11-14 DIAGNOSIS — K5792 Diverticulitis of intestine, part unspecified, without perforation or abscess without bleeding: Secondary | ICD-10-CM | POA: Insufficient documentation

## 2022-11-14 DIAGNOSIS — I10 Essential (primary) hypertension: Secondary | ICD-10-CM | POA: Diagnosis not present

## 2022-11-14 DIAGNOSIS — E876 Hypokalemia: Secondary | ICD-10-CM | POA: Diagnosis not present

## 2022-11-14 DIAGNOSIS — R109 Unspecified abdominal pain: Secondary | ICD-10-CM | POA: Diagnosis present

## 2022-11-14 LAB — COMPREHENSIVE METABOLIC PANEL
ALT: 16 U/L (ref 0–44)
AST: 10 U/L — ABNORMAL LOW (ref 15–41)
Albumin: 4.2 g/dL (ref 3.5–5.0)
Alkaline Phosphatase: 58 U/L (ref 38–126)
Anion gap: 10 (ref 5–15)
BUN: 19 mg/dL (ref 6–20)
CO2: 24 mmol/L (ref 22–32)
Calcium: 8.9 mg/dL (ref 8.9–10.3)
Chloride: 102 mmol/L (ref 98–111)
Creatinine, Ser: 1.04 mg/dL (ref 0.61–1.24)
GFR, Estimated: >60 mL/min (ref >60)
Glucose, Bld: 89 mg/dL (ref 70–99)
Potassium: 3.1 mmol/L — ABNORMAL LOW (ref 3.5–5.1)
Sodium: 136 mmol/L (ref 135–145)
Total Bilirubin: 0.6 mg/dL (ref 0.3–1.2)
Total Protein: 7 g/dL (ref 6.5–8.1)

## 2022-11-14 LAB — CBC WITH DIFFERENTIAL/PLATELET
Abs Immature Granulocytes: 0.11 10*3/uL — ABNORMAL HIGH (ref 0.00–0.07)
Basophils Absolute: 0.1 10*3/uL (ref 0.0–0.1)
Basophils Relative: 1 %
Eosinophils Absolute: 0.2 10*3/uL (ref 0.0–0.5)
Eosinophils Relative: 1 %
HCT: 44.4 % (ref 39.0–52.0)
Hemoglobin: 15.1 g/dL (ref 13.0–17.0)
Immature Granulocytes: 1 %
Lymphocytes Relative: 25 %
Lymphs Abs: 4.2 10*3/uL — ABNORMAL HIGH (ref 0.7–4.0)
MCH: 30.1 pg (ref 26.0–34.0)
MCHC: 34 g/dL (ref 30.0–36.0)
MCV: 88.4 fL (ref 80.0–100.0)
Monocytes Absolute: 1.1 10*3/uL — ABNORMAL HIGH (ref 0.1–1.0)
Monocytes Relative: 7 %
Neutro Abs: 11 10*3/uL — ABNORMAL HIGH (ref 1.7–7.7)
Neutrophils Relative %: 65 %
Platelets: 440 10*3/uL — ABNORMAL HIGH (ref 150–400)
RBC: 5.02 MIL/uL (ref 4.22–5.81)
RDW: 12.9 % (ref 11.5–15.5)
WBC: 16.6 10*3/uL — ABNORMAL HIGH (ref 4.0–10.5)
nRBC: 0 % (ref 0.0–0.2)

## 2022-11-14 MED ORDER — OXYCODONE-ACETAMINOPHEN 5-325 MG PO TABS: 1.0000 | ORAL_TABLET | Freq: Three times a day (TID) | ORAL | 0 refills | Status: AC | PRN

## 2022-11-14 MED ORDER — AMOXICILLIN-POT CLAVULANATE 875-125 MG PO TABS
1.0000 | ORAL_TABLET | Freq: Two times a day (BID) | ORAL | 0 refills | Status: AC
  Filled 2022-11-14: qty 14, 7d supply, fill #0

## 2022-11-14 MED ORDER — HYDROMORPHONE HCL 1 MG/ML IJ SOLN
1.0000 mg | Freq: Once | INTRAMUSCULAR | Status: AC
  Administered 2022-11-14: 1 mg via INTRAVENOUS
  Filled 2022-11-14: qty 1

## 2022-11-14 MED ORDER — SODIUM CHLORIDE 0.9 % IV SOLN
3.0000 g | Freq: Once | INTRAVENOUS | Status: AC
  Administered 2022-11-14: 3 g via INTRAVENOUS

## 2022-11-14 MED ORDER — MORPHINE SULFATE (PF) 4 MG/ML IV SOLN
4.0000 mg | Freq: Once | INTRAVENOUS | Status: AC
  Administered 2022-11-14: 4 mg via INTRAVENOUS
  Filled 2022-11-14: qty 1

## 2022-11-14 MED ORDER — ONDANSETRON HCL 4 MG/2ML IJ SOLN
4.0000 mg | Freq: Once | INTRAMUSCULAR | Status: AC
  Administered 2022-11-14: 4 mg via INTRAVENOUS
  Filled 2022-11-14: qty 2

## 2022-11-14 NOTE — ED Notes (Signed)
Patient transported to CT 

## 2022-11-14 NOTE — Discharge Instructions (Addendum)
You have an acute diverticulitis flare.  Take Augmentin twice a day for 7 days.  Please complete the full course of the antibiotic.  Percocet prescribed as needed for pain.  Follow-up with a gastroenterologist in 1 to 2 days.  Please return to the ED if: Your pain gets worse. You are not pooping like normal. Your symptoms do not get better. Your symptoms get worse very fast. You have a fever. You vomit more than one time. You have poop that is: Bloody. Black. Tarry.

## 2022-11-14 NOTE — ED Provider Notes (Signed)
[place] EMERGENCY DEPARTMENT AT [facility] Provider Note   CSN: [id number] Arrival date & time: 11/14/22  0849     History  Chief Complaint  Patient presents with   Abdominal Pain    Benjamin [name] is a 52 y.o. male with a history of diverticulitis, hypertension, and DVTs who presents to the ED today for abdominal pain. Patient reports pain woke him up out of sleep 3 hours prior to arrival. He took Hydrocodone for the pain without relief. No nausea, vomiting, or fevers. He denies changes blood in his stool, changes to his bowel habits or dysuria.  Patient was seen by his gastroenterologist 3 days ago and he was in remission of his diverticulitis then. Patient is scheduled for an EGD and colonoscopy per GI's request at the end of July for further evaluation.    Home Medications Prior to Admission medications   Medication Sig Start Date End Date Taking? Authorizing Provider  amoxicillin-clavulanate (AUGMENTIN) 875-125 MG tablet Take 1 tablet by mouth every 12 (twelve) hours for 7 days. 11/14/22 11/21/22 Yes Maxwell Marion, PA-C  oxyCODONE-acetaminophen (PERCOCET/ROXICET) 5-325 MG tablet Take 1 tablet by mouth every 8 (eight) hours as needed for up to 3 days for severe pain. 11/14/22 11/17/22 Yes Maxwell Marion, PA-C  buPROPion (WELLBUTRIN XL) 150 MG 24 hr tablet Take 450 mg by mouth daily.    [provider]  dicyclomine (BENTYL) 10 MG capsule Take 1 capsule (10 mg total) by mouth 4 (four) times daily -  before meals and at bedtime. 10/07/22   Meryl Dare, MD  diltiazem (CARDIZEM CD) 240 MG 24 hr capsule TAKE 1 CAPSULE BY MOUTH EVERY DAY Patient taking differently: Take 240 mg by mouth daily. 02/19/21   Patwardhan, Anabel Bene, MD  esomeprazole (NEXIUM) 20 MG capsule Take 40 mg by mouth daily at 12 noon.    [provider]  fluconazole (DIFLUCAN) 150 MG tablet Take 150 mg by mouth once a week.    [provider]  HYDROcodone-acetaminophen  (NORCO/VICODIN) 5-325 MG tablet Take 1 tablet by mouth every 6 (six) hours as needed. 10/03/22   [provider]  losartan (COZAAR) 25 MG tablet Take 2 tablets (50 mg total) by mouth daily. 09/27/22 10/27/22  Briant Cedar, MD  ondansetron (ZOFRAN) 4 MG tablet Take 1 tablet (4 mg total) by mouth every 8 (eight) hours as needed for nausea or vomiting. 06/24/20   Haskel Schroeder, PA-C  polyethylene glycol (MIRALAX / GLYCOLAX) 17 g packet Take 17 g by mouth daily as needed. 09/27/22   Briant Cedar, MD  rivaroxaban (XARELTO) 20 MG TABS tablet Take 1 tablet (20 mg total) by mouth daily with supper. 10/04/22   Rondel Baton, MD  rosuvastatin (CRESTOR) 40 MG tablet Take 1 tablet (40 mg total) by mouth once a week. Patient taking differently: Take 40 mg by mouth every Friday. 12/27/17   Ofilia Neas, PA-C  tamsulosin (FLOMAX) 0.4 MG CAPS capsule Take 1 capsule (0.4 mg total) by mouth daily. 04/05/18   Benjiman Core D, PA-C  tiZANidine (ZANAFLEX) 4 MG tablet Take 4 mg by mouth as directed.    [provider]  valACYclovir (VALTREX) 500 MG tablet Take 1 tablet (500 mg total) by mouth daily. 12/30/17   Ofilia Neas, PA-C  lisinopril-hydrochlorothiazide (PRINZIDE,ZESTORETIC) 20-25 MG per tablet Take 1 tablet by mouth daily. 07/24/11 08/21/11  Gordy Savers, MD      Allergies    Bismuth subsalicylate and  Compazine [prochlorperazine edisylate]    Review of Systems   Review of Systems  Gastrointestinal:  Positive for abdominal pain.  All other systems reviewed and are negative.   Physical Exam Updated Vital Signs BP (!) 152/116   Pulse 72   Temp 98.2 F (36.8 C) (Oral)   Resp 18   SpO2 98%  Physical Exam Vitals and nursing note reviewed.  Constitutional:      General: He is in acute distress.     Appearance: Normal appearance.  HENT:     Head: Normocephalic and atraumatic.     Mouth/Throat:     Mouth: Mucous membranes are moist.  Eyes:      Conjunctiva/sclera: Conjunctivae normal.     Pupils: Pupils are equal, round, and reactive to light.  Cardiovascular:     Rate and Rhythm: Normal rate and regular rhythm.     Pulses: Normal pulses.     Heart sounds: Normal heart sounds.  Pulmonary:     Effort: Pulmonary effort is normal.     Breath sounds: Normal breath sounds.  Abdominal:     Palpations: Abdomen is soft.     Tenderness: There is abdominal tenderness. There is guarding.     Comments: LLQ tenderness with guarding  Skin:    General: Skin is warm and dry.     Findings: No rash.  Neurological:     General: No focal deficit present.     Mental Status: He is alert.  Psychiatric:        Mood and Affect: Mood normal.        Behavior: Behavior normal.     ED Results / Procedures / Treatments   Labs (all labs ordered are listed, but only abnormal results are displayed) Labs Reviewed  CBC WITH DIFFERENTIAL/PLATELET - Abnormal; Notable for the following components:      Result Value   WBC 16.6 (*)    Platelets 440 (*)    Neutro Abs 11.0 (*)    Lymphs Abs 4.2 (*)    Monocytes Absolute 1.1 (*)    Abs Immature Granulocytes 0.11 (*)    All other components within normal limits  COMPREHENSIVE METABOLIC PANEL - Abnormal; Notable for the following components:   Potassium 3.1 (*)    AST 10 (*)    All other components within normal limits    EKG None  Radiology CT ABDOMEN PELVIS WO CONTRAST  Result Date: 11/14/2022 CLINICAL DATA:  52 year old male with history of left lower quadrant abdominal pain. EXAM: CT ABDOMEN AND PELVIS WITHOUT CONTRAST TECHNIQUE: Multidetector CT imaging of the abdomen and pelvis was performed following the standard protocol without IV contrast. RADIATION DOSE REDUCTION: This exam was performed according to the departmental dose-optimization program which includes automated exposure control, adjustment of the mA and/or kV according to patient size and/or use of iterative reconstruction technique.  COMPARISON:  CT of the abdomen and pelvis 10/07/2022. FINDINGS: Lower chest: Unremarkable. Hepatobiliary: No definite suspicious cystic or solid hepatic lesions are confidently identified on today's noncontrast CT examination. Unenhanced appearance of the gallbladder is unremarkable. Pancreas: No definite pancreatic mass or peripancreatic fluid collections or inflammatory changes are noted on today's noncontrast CT examination. Spleen: Unremarkable. Adrenals/Urinary Tract: Multiple tiny nonobstructive calculi are noted within the collecting systems of both kidneys measuring up to 4 mm in the lower pole of the left kidney. No additional calculi are noted along the course of either ureter or within the lumen of the urinary bladder. No hydroureteronephrosis. Unenhanced appearance of  the kidneys and bilateral adrenal glands is otherwise unremarkable. Urinary bladder is normal in appearance. Stomach/Bowel: Unenhanced appearance of the stomach is unremarkable. No pathologic dilatation of small bowel or colon. Numerous colonic diverticuli are noted. There are extensive inflammatory changes surrounding the proximal sigmoid colon where there is also widespread mural thickening, presumably reflective of an acute diverticulitis. No discrete diverticular abscess confidently identified at this time. No definite extraluminal gas identified in the sigmoid mesocolon. Appendicoliths are present within the appendix, but the appendix is normal in caliber without surrounding inflammatory changes. Vascular/Lymphatic: Aortic atherosclerosis. No lymphadenopathy noted in the abdomen or pelvis. Reproductive: Prostate gland and seminal vesicles are unremarkable in appearance. Other: No significant volume of ascites. No pneumoperitoneum. Tiny umbilical hernia containing only omental fat incidentally noted. Musculoskeletal: There are no aggressive appearing lytic or blastic lesions noted in the visualized portions of the skeleton. IMPRESSION:  1. Acute diverticulitis centered in the region of the sigmoid colon. No discrete diverticular abscess or signs of frank perforation are noted at this time. 2. Rather diffuse somewhat mass-like mural thickening throughout the proximal sigmoid colon. This is presumably reflective of acute inflammation in the setting of acute diverticulitis, however, follow-up nonemergent outpatient colonoscopy should be considered in the near future if the patient has not recently had a colonoscopy to exclude the possibility of colonic neoplasm. 3. Numerous nonobstructive calculi in the collecting systems of both kidneys measuring up to 4 mm in the lower pole collecting system of the left kidney. No ureteral stones or findings of urinary tract obstruction. 4. Aortic atherosclerosis. Electronically Signed   By: Trudie Reed M.D.   On: 11/14/2022 10:06    Procedures Procedures: not indicated.   Medications Ordered in ED Medications  morphine (PF) 4 MG/ML injection 4 mg (4 mg Intravenous Given 11/14/22 0929)  ondansetron (ZOFRAN) injection 4 mg (4 mg Intravenous Given 11/14/22 0930)  Ampicillin-Sulbactam (UNASYN) 3 g in sodium chloride 0.9 % 100 mL IVPB (3 g Intravenous New Bag/Given 11/14/22 1056)  HYDROmorphone (DILAUDID) injection 1 mg (1 mg Intravenous Given 11/14/22 1053)    ED Course/ Medical Decision Making/ A&P Clinical Course as of 11/14/22 1407  Sat Nov 14, 2022  1141 CT ABDOMEN PELVIS WO CONTRAST [HT]    Clinical Course User Index [HT] Maxwell Marion, PA-C                             Medical Decision Making Amount and/or Complexity of Data Reviewed Labs: ordered. Radiology: ordered. Decision-making details documented in ED Course.  Risk Prescription drug management.   This patient presents to the ED for concern of LLQ pain, this involves an extensive number of treatment options, and is a complaint that carries with it a high risk of complications and morbidity.   Differential diagnosis  includes: diverticulitis, bowel perforation   Co morbidities that complicate the patient evaluation  History of diverticulitis Hypertension Hyperlipidemia Adenomatous colon polyp   Additional history obtained:  Additional history obtained from patient's records.   Lab Tests:  I ordered and personally interpreted labs - elevated WBC of 16.6 otherwise within normal limits.   Imaging Studies ordered:  I ordered imaging studies including CT abdomen non-contrast  I independently visualized and interpreted imaging which showed:  1. Acute diverticulitis centered in the region of the sigmoid colon. No discrete diverticular abscess or signs of frank perforation are noted at this time.  2. Rather diffuse somewhat mass-like mural thickening throughout the proximal  sigmoid colon. This is presumably reflective of acute  inflammation in the setting of acute diverticulitis, however, follow-up nonemergent outpatient colonoscopy should be considered in the near future if the patient has not recently had a colonoscopy to exclude the possibility of colonic neoplasm.  3. Numerous nonobstructive calculi in the collecting systems of both kidneys measuring up to 4 mm in the lower pole collecting system of the left kidney. No ureteral stones or findings of urinary tract obstruction.  4. Aortic atherosclerosis.   I agree with the radiologist interpretation    Problem List / ED Course / Critical interventions / Medication management  New onset LLQ pain I ordered medications including: Morphine and Zofran for pain. On reevaluation patient denies pain relief Dilaudid was given with relief of symptoms. 3 g Unasyn given in the ED I have reviewed the patients home medicines and have made adjustments as needed   Social Determinants of Health:  Access to healthcare Transportation Housing   Test / Admission - Considered:  Discussed admission with patient and he declined. He wishes to manage the  pain at home rather than in a hospital. Patient reports Percocet has helped him with his pain second to diverticulitis before and is requesting to try that. Paper prescription given. I prescribed Augmentin for diverticulitis treatment. Patient reports that his GI specialist gave him prescriptions for Flagyl and Cipro to take for diverticulitis instead for possible Augmentin resistance. Patient reports that he has the medication at home and does not need a new prescription at this time. Follow up with GI in 1-2 days. Strict return precautions given.        Final Clinical Impression(s) / ED Diagnoses Final diagnoses:  Diverticulitis    Rx / DC Orders ED Discharge Orders          Ordered    amoxicillin-clavulanate (AUGMENTIN) 875-125 MG tablet  Every 12 hours        11/14/22 1244    oxyCODONE-acetaminophen (PERCOCET/ROXICET) 5-325 MG tablet  Every 8 hours PRN        11/14/22 1244              Maxwell Marion, PA-C 11/14/22 1407    Lorre Nick, MD 11/15/22 [phone]

## 2022-11-14 NOTE — ED Triage Notes (Signed)
Left lower quad abd pain,started 3 hours, very intense, "feels like my diverticulitis." No fevers

## 2022-11-14 NOTE — ED Provider Notes (Signed)
I provided a substantive portion of the care of this patient.  I personally made/approved the management plan for this patient and take responsibility for the patient management.      Patient presents with left lower quadrant pain times several hours.  This similar to his prior episodes of diverticulitis.  On exam he is tender in his left lower quadrant.  Will order abdominal CT and labs and reassess   Lorre Nick, MD 11/14/22 1027

## 2022-11-16 NOTE — Telephone Encounter (Signed)
Letter printed and faxed to Dr. Myna Hidalgo at [phone], phone: [phone]

## 2022-11-17 NOTE — Telephone Encounter (Signed)
Rec'd fax back from Dr. Gustavo Lah office with handwritten note stating:  "Please stop Xarelto 2 days before procedure and resume Xarelto 20 mg day after procedure". Note is signed by Dr. Myna Hidalgo.  Called patient and left message to please call back to discuss Xarelto prior to appointment with Dr. Russella Dar on 7-29.

## 2022-11-18 NOTE — Telephone Encounter (Signed)
Called and spoke to patient.  He is aware to hold Xarelto for 2 days prior to procedure.  He wanted to make Dr. Russella Dar aware that he was in the hospital this past weekend with another "flare".  He is starting to feel better but wanted to make sure this would not interfere with his ECL scheduled for 7-29.

## 2022-11-18 NOTE — Telephone Encounter (Signed)
ECL is 6 weeks away so OK to proceed as scheduled.

## 2022-12-05 ENCOUNTER — Other Ambulatory Visit (HOSPITAL_BASED_OUTPATIENT_CLINIC_OR_DEPARTMENT_OTHER): Payer: Self-pay

## 2022-12-21 ENCOUNTER — Encounter (HOSPITAL_BASED_OUTPATIENT_CLINIC_OR_DEPARTMENT_OTHER): Payer: Self-pay | Admitting: Emergency Medicine

## 2022-12-21 ENCOUNTER — Telehealth: Payer: Self-pay | Admitting: Gastroenterology

## 2022-12-21 ENCOUNTER — Other Ambulatory Visit: Payer: Self-pay

## 2022-12-21 ENCOUNTER — Emergency Department (HOSPITAL_BASED_OUTPATIENT_CLINIC_OR_DEPARTMENT_OTHER)
Admission: EM | Admit: 2022-12-21 | Discharge: 2022-12-21 | Disposition: A | Discharge status: Home / Self Care | Payer: BC Managed Care – PPO | Source: Home / Self Care | Attending: Emergency Medicine | Admitting: Emergency Medicine

## 2022-12-21 ENCOUNTER — Ambulatory Visit (HOSPITAL_BASED_OUTPATIENT_CLINIC_OR_DEPARTMENT_OTHER): Admission: RE | Admit: 2022-12-21 | Payer: BC Managed Care – PPO | Source: Ambulatory Visit

## 2022-12-21 ENCOUNTER — Emergency Department (HOSPITAL_BASED_OUTPATIENT_CLINIC_OR_DEPARTMENT_OTHER): Payer: BC Managed Care – PPO

## 2022-12-21 DIAGNOSIS — K5732 Diverticulitis of large intestine without perforation or abscess without bleeding: Secondary | ICD-10-CM | POA: Insufficient documentation

## 2022-12-21 DIAGNOSIS — Z79899 Other long term (current) drug therapy: Secondary | ICD-10-CM | POA: Insufficient documentation

## 2022-12-21 DIAGNOSIS — N182 Chronic kidney disease, stage 2 (mild): Secondary | ICD-10-CM | POA: Insufficient documentation

## 2022-12-21 DIAGNOSIS — I129 Hypertensive chronic kidney disease with stage 1 through stage 4 chronic kidney disease, or unspecified chronic kidney disease: Secondary | ICD-10-CM | POA: Diagnosis not present

## 2022-12-21 DIAGNOSIS — K5792 Diverticulitis of intestine, part unspecified, without perforation or abscess without bleeding: Secondary | ICD-10-CM

## 2022-12-21 DIAGNOSIS — R1032 Left lower quadrant pain: Secondary | ICD-10-CM | POA: Diagnosis present

## 2022-12-21 LAB — URINALYSIS, ROUTINE W REFLEX MICROSCOPIC
Bacteria, UA: NONE SEEN
Bilirubin Urine: NEGATIVE
Glucose, UA: NEGATIVE mg/dL
Ketones, ur: NEGATIVE mg/dL
Leukocytes,Ua: NEGATIVE
Nitrite: NEGATIVE
Specific Gravity, Urine: 1.025 (ref 1.005–1.030)
pH: 6 (ref 5.0–8.0)

## 2022-12-21 LAB — DIFFERENTIAL
Abs Immature Granulocytes: 0.07 10*3/uL (ref 0.00–0.07)
Basophils Absolute: 0.1 10*3/uL (ref 0.0–0.1)
Basophils Relative: 1 %
Eosinophils Absolute: 0.1 10*3/uL (ref 0.0–0.5)
Eosinophils Relative: 1 %
Immature Granulocytes: 0 %
Lymphocytes Relative: 17 %
Lymphs Abs: 2.9 10*3/uL (ref 0.7–4.0)
Monocytes Absolute: 1.7 10*3/uL — ABNORMAL HIGH (ref 0.1–1.0)
Monocytes Relative: 10 %
Neutro Abs: 12.3 10*3/uL — ABNORMAL HIGH (ref 1.7–7.7)
Neutrophils Relative %: 71 %

## 2022-12-21 LAB — CBC
HCT: 44.6 % (ref 39.0–52.0)
Hemoglobin: 15.4 g/dL (ref 13.0–17.0)
MCH: 30.3 pg (ref 26.0–34.0)
MCHC: 34.5 g/dL (ref 30.0–36.0)
MCV: 87.6 fL (ref 80.0–100.0)
Platelets: 377 10*3/uL (ref 150–400)
RBC: 5.09 MIL/uL (ref 4.22–5.81)
RDW: 12.9 % (ref 11.5–15.5)
WBC: 16.8 10*3/uL — ABNORMAL HIGH (ref 4.0–10.5)
nRBC: 0 % (ref 0.0–0.2)

## 2022-12-21 LAB — COMPREHENSIVE METABOLIC PANEL
ALT: 12 U/L (ref 0–44)
AST: 13 U/L — ABNORMAL LOW (ref 15–41)
Albumin: 4.6 g/dL (ref 3.5–5.0)
Alkaline Phosphatase: 64 U/L (ref 38–126)
Anion gap: 11 (ref 5–15)
BUN: 17 mg/dL (ref 6–20)
CO2: 24 mmol/L (ref 22–32)
Calcium: 9.7 mg/dL (ref 8.9–10.3)
Chloride: 103 mmol/L (ref 98–111)
Creatinine, Ser: 1.1 mg/dL (ref 0.61–1.24)
GFR, Estimated: >60 mL/min (ref >60)
Glucose, Bld: 109 mg/dL — ABNORMAL HIGH (ref 70–99)
Potassium: 3.7 mmol/L (ref 3.5–5.1)
Sodium: 138 mmol/L (ref 135–145)
Total Bilirubin: 1.7 mg/dL — ABNORMAL HIGH (ref 0.3–1.2)
Total Protein: 8 g/dL (ref 6.5–8.1)

## 2022-12-21 LAB — LIPASE, BLOOD: Lipase: <10 U/L — ABNORMAL LOW (ref 11–51)

## 2022-12-21 MED ORDER — METRONIDAZOLE 500 MG PO TABS: 500.0000 mg | ORAL_TABLET | Freq: Two times a day (BID) | ORAL | 0 refills | Status: DC

## 2022-12-21 MED ORDER — ONDANSETRON 4 MG PO TBDP: 4.0000 mg | ORAL_TABLET | Freq: Three times a day (TID) | ORAL | 0 refills | Status: DC | PRN

## 2022-12-21 MED ORDER — ONDANSETRON HCL 4 MG/2ML IJ SOLN
4.0000 mg | Freq: Once | INTRAMUSCULAR | Status: AC
  Administered 2022-12-21: 4 mg via INTRAVENOUS
  Filled 2022-12-21: qty 2

## 2022-12-21 MED ORDER — IOHEXOL 300 MG/ML  SOLN
100.0000 mL | Freq: Once | INTRAMUSCULAR | Status: AC | PRN
  Administered 2022-12-21: 85 mL via INTRAVENOUS

## 2022-12-21 MED ORDER — HYDROCODONE-ACETAMINOPHEN 5-325 MG PO TABS: 2.0000 | ORAL_TABLET | ORAL | 0 refills | Status: DC | PRN

## 2022-12-21 MED ORDER — CIPROFLOXACIN HCL 500 MG PO TABS: 500.0000 mg | ORAL_TABLET | Freq: Two times a day (BID) | ORAL | 0 refills | Status: AC

## 2022-12-21 MED ORDER — HYDROMORPHONE HCL 1 MG/ML IJ SOLN
1.0000 mg | Freq: Once | INTRAMUSCULAR | Status: AC
  Administered 2022-12-21: 1 mg via INTRAVENOUS
  Filled 2022-12-21: qty 1

## 2022-12-21 MED ORDER — LACTATED RINGERS IV BOLUS
1000.0000 mL | Freq: Once | INTRAVENOUS | Status: AC
  Administered 2022-12-21: 1000 mL via INTRAVENOUS

## 2022-12-21 NOTE — Telephone Encounter (Signed)
Complete a 14 days course of Cipro and Flagyl Reschedule colonoscopy and EGD for late August, early September

## 2022-12-21 NOTE — ED Notes (Signed)
Patient verbalizes understanding of discharge instructions. Opportunity for questioning and answers were provided. Patient discharged from ED.  °

## 2022-12-21 NOTE — ED Triage Notes (Signed)
Pt arrived POV from home, caox4, ambulatory c/o lower abd pain since yesterday. Pt reports pain is consistent with previous diverticulitis flare ups. Denies vomiting or diarrhea, c/o mild nausea. Took Augmentin yesterday for same.

## 2022-12-21 NOTE — ED Provider Notes (Signed)
[place] EMERGENCY DEPARTMENT AT [facility] Provider Note   CSN: [id number] Arrival date & time: 12/21/22  7829     History  Chief Complaint  Patient presents with   Abdominal Pain    Benjamin [name] is a 52 y.o. male.  52 year old male presents today for evaluation of lower abdominal pain.  Has history of diverticulitis.  States he has had frequent flareups recently.  He states however he does not have the typical left lower quadrant pain today.  Denies nausea, vomiting, change in bowel habits, hematuria, radiation of the pain anywhere else.  No fever.  The history is provided by the patient. No language interpreter was used.       Home Medications Prior to Admission medications   Medication Sig Start Date End Date Taking? Authorizing Provider  buPROPion (WELLBUTRIN XL) 150 MG 24 hr tablet Take 450 mg by mouth daily.    [provider]  dicyclomine (BENTYL) 10 MG capsule Take 1 capsule (10 mg total) by mouth 4 (four) times daily -  before meals and at bedtime. 10/07/22   Meryl Dare, MD  diltiazem (CARDIZEM CD) 240 MG 24 hr capsule TAKE 1 CAPSULE BY MOUTH EVERY DAY Patient taking differently: Take 240 mg by mouth daily. 02/19/21   Patwardhan, Anabel Bene, MD  esomeprazole (NEXIUM) 20 MG capsule Take 40 mg by mouth daily at 12 noon.    [provider]  fluconazole (DIFLUCAN) 150 MG tablet Take 150 mg by mouth once a week.    [provider]  HYDROcodone-acetaminophen (NORCO/VICODIN) 5-325 MG tablet Take 1 tablet by mouth every 6 (six) hours as needed. 10/03/22   [provider]  losartan (COZAAR) 25 MG tablet Take 2 tablets (50 mg total) by mouth daily. 09/27/22 10/27/22  Briant Cedar, MD  ondansetron (ZOFRAN) 4 MG tablet Take 1 tablet (4 mg total) by mouth every 8 (eight) hours as needed for nausea or vomiting. 06/24/20   Haskel Schroeder, PA-C  polyethylene glycol (MIRALAX / GLYCOLAX) 17 g packet Take 17 g by mouth daily as  needed. 09/27/22   Briant Cedar, MD  rivaroxaban (XARELTO) 20 MG TABS tablet Take 1 tablet (20 mg total) by mouth daily with supper. 10/04/22   Rondel Baton, MD  rosuvastatin (CRESTOR) 40 MG tablet Take 1 tablet (40 mg total) by mouth once a week. Patient taking differently: Take 40 mg by mouth every Friday. 12/27/17   Ofilia Neas, PA-C  tamsulosin (FLOMAX) 0.4 MG CAPS capsule Take 1 capsule (0.4 mg total) by mouth daily. 04/05/18   Benjiman Core D, PA-C  tiZANidine (ZANAFLEX) 4 MG tablet Take 4 mg by mouth as directed.    [provider]  valACYclovir (VALTREX) 500 MG tablet Take 1 tablet (500 mg total) by mouth daily. 12/30/17   Ofilia Neas, PA-C  lisinopril-hydrochlorothiazide (PRINZIDE,ZESTORETIC) 20-25 MG per tablet Take 1 tablet by mouth daily. 07/24/11 08/21/11  Gordy Savers, MD      Allergies    Bismuth subsalicylate and Compazine [prochlorperazine edisylate]    Review of Systems   Review of Systems  Constitutional:  Negative for chills and fever.  Gastrointestinal:  Positive for abdominal pain. Negative for nausea and vomiting.  Genitourinary:  Negative for dysuria, flank pain and hematuria.  Neurological:  Negative for light-headedness.  All other systems reviewed and are negative.   Physical Exam Updated Vital Signs BP (!) 144/102 (BP Location: Left Arm)   Pulse (!) 102  Temp 98.3 F (36.8 C) (Tympanic)   Resp (!) 22   Ht 5\' 9"  (1.753 m)   Wt 83.9 kg   SpO2 99%   BMI 27.32 kg/m  Physical Exam Vitals and nursing note reviewed.  Constitutional:      General: He is not in acute distress.    Appearance: Normal appearance. He is not ill-appearing.  HENT:     Head: Normocephalic and atraumatic.     Nose: Nose normal.  Eyes:     General: No scleral icterus.    Extraocular Movements: Extraocular movements intact.     Conjunctiva/sclera: Conjunctivae normal.  Cardiovascular:     Rate and Rhythm: Normal rate and regular rhythm.      Pulses: Normal pulses.     Heart sounds: Normal heart sounds.  Pulmonary:     Effort: Pulmonary effort is normal. No respiratory distress.     Breath sounds: Normal breath sounds. No wheezing or rales.  Abdominal:     General: There is no distension.     Tenderness: There is abdominal tenderness. There is no guarding.  Musculoskeletal:        General: Normal range of motion.     Cervical back: Normal range of motion.  Skin:    General: Skin is warm and dry.  Neurological:     General: No focal deficit present.     Mental Status: He is alert. Mental status is at baseline.     ED Results / Procedures / Treatments   Labs (all labs ordered are listed, but only abnormal results are displayed) Labs Reviewed  COMPREHENSIVE METABOLIC PANEL - Abnormal; Notable for the following components:      Result Value   Glucose, Bld 109 (*)    AST 13 (*)    Total Bilirubin 1.7 (*)    All other components within normal limits  CBC - Abnormal; Notable for the following components:   WBC 16.8 (*)    All other components within normal limits  URINALYSIS, ROUTINE W REFLEX MICROSCOPIC - Abnormal; Notable for the following components:   Color, Urine ORANGE (*)    Hgb urine dipstick MODERATE (*)    Protein, ur TRACE (*)    All other components within normal limits  LIPASE, BLOOD    EKG None  Radiology No results found.  Procedures Procedures    Medications Ordered in ED Medications  HYDROmorphone (DILAUDID) injection 1 mg (has no administration in time range)  ondansetron (ZOFRAN) injection 4 mg (has no administration in time range)  lactated ringers bolus 1,000 mL (has no administration in time range)    ED Course/ Medical Decision Making/ A&P                             Medical Decision Making Amount and/or Complexity of Data Reviewed Labs: ordered. Radiology: ordered.  Risk Prescription drug management.   Medical Decision Making / ED Course   This patient presents to  the ED for concern of abdominal pain, this involves an extensive number of treatment options, and is a complaint that carries with it a high risk of complications and morbidity.  The differential diagnosis includes diverticulitis, appendicitis, colitis, pancreatitis, pyelonephritis  MDM: 52 year old male with past medical history significant for diverticulitis presents today for lower abdominal pain.  Concern for diverticulitis flareup.  Labs overall reassuring.  Leukocytosis present.  No anemia.  Renal function preserved.  Will order differential.  UA without evidence  of UTI.  CT abdomen pelvis with contrast ordered.  Fluid bolus, pain control ordered.  CT with evidence of acute diverticulitis.  No perforation no abscess.  No other acute findings.  Cipro, Flagyl prescribed.  States in the past he failed Augmentin and would prefer the Cipro and Flagyl combination.  Pain medication, nausea medication sent in as well.  Patient is stable for discharge.  Discharged in stable condition.  Return precautions discussed.   Additional history obtained: -Additional history obtained from wife at bedside -External records from outside source obtained and reviewed including: Chart review including previous notes, labs, imaging, consultation notes   Lab Tests: -I ordered, reviewed, and interpreted labs.   The pertinent results include:   Labs Reviewed  LIPASE, BLOOD - Abnormal; Notable for the following components:      Result Value   Lipase <10 (*)    All other components within normal limits  COMPREHENSIVE METABOLIC PANEL - Abnormal; Notable for the following components:   Glucose, Bld 109 (*)    AST 13 (*)    Total Bilirubin 1.7 (*)    All other components within normal limits  CBC - Abnormal; Notable for the following components:   WBC 16.8 (*)    All other components within normal limits  URINALYSIS, ROUTINE W REFLEX MICROSCOPIC - Abnormal; Notable for the following components:   Color, Urine  ORANGE (*)    Hgb urine dipstick MODERATE (*)    Protein, ur TRACE (*)    All other components within normal limits  DIFFERENTIAL - Abnormal; Notable for the following components:   Neutro Abs 12.3 (*)    Monocytes Absolute 1.7 (*)    All other components within normal limits      EKG  EKG Interpretation Date/Time:    Ventricular Rate:    PR Interval:    QRS Duration:    QT Interval:    QTC Calculation:   R Axis:      Text Interpretation:           Imaging Studies ordered: I ordered imaging studies including CT abdomen pelvis with contrast I independently visualized and interpreted imaging. I agree with the radiologist interpretation   Medicines ordered and prescription drug management: Meds ordered this encounter  Medications   HYDROmorphone (DILAUDID) injection 1 mg   ondansetron (ZOFRAN) injection 4 mg   lactated ringers bolus 1,000 mL   iohexol (OMNIPAQUE) 300 MG/ML solution 100 mL   ondansetron (ZOFRAN-ODT) 4 MG disintegrating tablet    Sig: Take 1 tablet (4 mg total) by mouth every 8 (eight) hours as needed.    Dispense:  20 tablet    Refill:  0    Order Specific Question:   Supervising Provider    Answer:   Cathren Laine [[id number]]   HYDROcodone-acetaminophen (NORCO/VICODIN) 5-325 MG tablet    Sig: Take 2 tablets by mouth every 4 (four) hours as needed.    Dispense:  10 tablet    Refill:  0    Order Specific Question:   Supervising Provider    Answer:   Cathren Laine [[id number]]   ciprofloxacin (CIPRO) 500 MG tablet    Sig: Take 1 tablet (500 mg total) by mouth 2 (two) times daily for 7 days.    Dispense:  14 tablet    Refill:  0    Order Specific Question:   Supervising Provider    Answer:   Cathren Laine [[id number]]   metroNIDAZOLE (FLAGYL) 500 MG tablet    Sig:  Take 1 tablet (500 mg total) by mouth 2 (two) times daily.    Dispense:  14 tablet    Refill:  0    Order Specific Question:   Supervising Provider    Answer:   Cathren Laine [[id number]]    -I have  reviewed the patients home medicines and have made adjustments as needed   Reevaluation: After the interventions noted above, I reevaluated the patient and found that they have :improved  Co morbidities that complicate the patient evaluation  Past Medical History:  Diagnosis Date   Acute DVT of left tibial vein (HCC) 04/24/2017   Xarelto started (dx'd in emergency dept).  Dr. Myna Hidalgo recommended full dose xarelto x 40mo, with an additional year of maintenance dose xarelto (as of 05/2017 initial consult note).  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  Pt Factor V leiden heterozygote.  Stable as of 09/2017 hem f/u.   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  Pt referred for sleep study.   Chronic renal insufficiency, stage II (mild)    GFR 60s-70s (suspect HTN and NSAID damage as etiology).  GFR dipped into 50s fall 2018 after lots of NSAID use for chronic daily HA's.   Diverticulitis 2016   CT 01/2015   Frequent headaches    Gastric polyp 2006   GERD without esophagitis    Herpes labialis    Hiatal hernia    Hyperlipidemia    HYPERTENSION 01/05/2007   OSA (obstructive sleep apnea) 04/2017   Dr. Frances Furbish eval 04/2017-plan for sleep study.   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  Return precaution discussed.  Patient voices understanding and is in agreement with  plan.  Final Clinical Impression(s) / ED Diagnoses Final diagnoses:  Diverticulitis    Rx / DC Orders ED Discharge Orders          Ordered    ondansetron (ZOFRAN-ODT) 4 MG disintegrating tablet  Every 8 hours PRN        12/21/22 1314    HYDROcodone-acetaminophen (NORCO/VICODIN) 5-325 MG tablet  Every 4 hours PRN        12/21/22 1314    ciprofloxacin (CIPRO) 500 MG tablet  2 times daily        12/21/22 1314    metroNIDAZOLE (FLAGYL) 500 MG tablet  2 times daily        12/21/22 1314              Marita Kansas, PA-C 12/21/22 1322    Cathren Laine, MD 12/21/22 1356

## 2022-12-21 NOTE — Discharge Instructions (Signed)
Your workup today showed evidence of diverticulitis.  No perforation or abscess.  Dose of antibiotic, pain medication, nausea medication sent into your pharmacy.  Follow-up with your gastroenterologist.  For any concerning symptoms return to the emergency room.

## 2022-12-21 NOTE — Telephone Encounter (Signed)
Patients wife called to advise the patient was just at the ED for Diverticulitis.

## 2022-12-22 NOTE — Telephone Encounter (Signed)
The pt was given the antibiotics in the ED and will complete those.    Left message on machine to call back

## 2022-12-23 NOTE — Telephone Encounter (Signed)
Left message on machine to call back  

## 2022-12-24 NOTE — Telephone Encounter (Signed)
The pt advised to finish abx and colon endo moved to 8/20.  He has been reinstructed.

## 2022-12-28 ENCOUNTER — Encounter: Payer: BC Managed Care – PPO | Admitting: Gastroenterology

## 2023-01-16 ENCOUNTER — Encounter: Payer: Self-pay | Admitting: Certified Registered Nurse Anesthetist

## 2023-01-19 ENCOUNTER — Ambulatory Visit (AMBULATORY_SURGERY_CENTER): Payer: BC Managed Care – PPO | Admitting: Gastroenterology

## 2023-01-19 ENCOUNTER — Encounter: Payer: Self-pay | Admitting: Gastroenterology

## 2023-01-19 VITALS — BP 127/92 | HR 98 | Temp 97.8°F | Resp 20 | Ht 69.0 in | Wt 183.0 lb

## 2023-01-19 DIAGNOSIS — R933 Abnormal findings on diagnostic imaging of other parts of digestive tract: Secondary | ICD-10-CM | POA: Diagnosis not present

## 2023-01-19 DIAGNOSIS — Z09 Encounter for follow-up examination after completed treatment for conditions other than malignant neoplasm: Secondary | ICD-10-CM | POA: Diagnosis present

## 2023-01-19 DIAGNOSIS — K5732 Diverticulitis of large intestine without perforation or abscess without bleeding: Secondary | ICD-10-CM | POA: Diagnosis not present

## 2023-01-19 DIAGNOSIS — D123 Benign neoplasm of transverse colon: Secondary | ICD-10-CM

## 2023-01-19 DIAGNOSIS — K2270 Barrett's esophagus without dysplasia: Secondary | ICD-10-CM | POA: Diagnosis not present

## 2023-01-19 DIAGNOSIS — K3189 Other diseases of stomach and duodenum: Secondary | ICD-10-CM | POA: Diagnosis not present

## 2023-01-19 DIAGNOSIS — Z86010 Personal history of colonic polyps: Secondary | ICD-10-CM

## 2023-01-19 MED ORDER — SODIUM CHLORIDE 0.9 % IV SOLN: 500.0000 mL | Freq: Once | INTRAVENOUS | Status: DC

## 2023-01-19 NOTE — Op Note (Signed)
[facility] Patient Name: Benjamin [name] Procedure Date: 01/19/2023 2:20 PM MRN: [id number] Endoscopist: Meryl Dare , MD, [phone] Age: 52 Referring MD:  Date of Birth: 1970/07/09 Gender: Male Account #: [id number] Procedure:                Upper GI endoscopy Indications:              Surveillance for malignancy due to personal history                            of Barrett's esophagus Medicines:                Monitored Anesthesia Care Procedure:                Pre-Anesthesia Assessment:                           - Prior to the procedure, a History and Physical                            was performed, and patient medications and                            allergies were reviewed. The patient's tolerance of                            previous anesthesia was also reviewed. The risks                            and benefits of the procedure and the sedation                            options and risks were discussed with the patient.                            All questions were answered, and informed consent                            was obtained. Prior Anticoagulants: The patient has                            taken Xarelto (rivaroxaban), last dose was 2 days                            prior to procedure. ASA Grade Assessment: II - A                            patient with mild systemic disease. After reviewing                            the risks and benefits, the patient was deemed in                            satisfactory condition to undergo the procedure.  After obtaining informed consent, the endoscope was                            passed under direct vision. Throughout the                            procedure, the patient's blood pressure, pulse, and                            oxygen saturations were monitored continuously. The                            GIF [id number] #[id number] was introduced through the                            mouth,  and advanced to the second part of duodenum.                            The upper GI endoscopy was accomplished without                            difficulty. The patient tolerated the procedure                            well. Scope In: Scope Out: Findings:                 There were esophageal mucosal changes consistent                            with short-segment Barrett's esophagus present in                            the distal esophagus. The maximum longitudinal                            extent of these mucosal changes was 1 cm in length.                            Mucosa was biopsied with a cold forceps for                            histology. One specimen bottle was sent to                            pathology.                           The exam of the esophagus was otherwise normal.                           A small hiatal hernia was present.                           Patchy mild inflammation characterized by erythema  and granularity was found in the gastric fundus and                            in the gastric body. Biopsies were taken with a                            cold forceps for histology.                           The exam of the stomach was otherwise normal.                           The duodenal bulb and second portion of the                            duodenum were normal. Complications:            No immediate complications. Estimated Blood Loss:     Estimated blood loss was minimal. Impression:               - Esophageal mucosal changes consistent with                            short-segment Barrett's esophagus. Biopsied.                           - Small hiatal hernia.                           - Gastritis. Biopsied.                           - Normal duodenal bulb and second portion of the                            duodenum. Recommendation:           - Patient has a contact number available for                             emergencies. The signs and symptoms of potential                            delayed complications were discussed with the                            patient. Return to normal activities tomorrow.                            Written discharge instructions were provided to the                            patient.                           - Resume previous diet.                           -  Follow antireflux measures.                           - Continue present medications.                           - Await pathology results.                           - Repeat upper endoscopy after studies are complete                            for surveillance of Barrett's esophagus.                           - Resume Xarelto (rivaroxaban) at prior dose                            tomorrow. Refer to managing physician for further                            adjustment of therapy. Meryl Dare, MD 01/19/2023 3:22:24 PM This report has been signed electronically.

## 2023-01-19 NOTE — Progress Notes (Signed)
1500 HR > 100 with esmolol 25 mg given IV, MD updated, vss  

## 2023-01-19 NOTE — Op Note (Signed)
[facility] Patient Name: Benjamin [name] Procedure Date: 01/19/2023 2:21 PM MRN: [id number] Endoscopist: Meryl Dare , MD, [phone] Age: 52 Referring MD:  Date of Birth: 1971-03-27 Gender: Male Account #: [id number] Procedure:                Colonoscopy Indications:              Abnormal CT of the GI tract (colon), recurrent                            diverticulitis, personal history of adenomatous                            colon polyps Medicines:                Monitored Anesthesia Care Procedure:                Pre-Anesthesia Assessment:                           - Prior to the procedure, a History and Physical                            was performed, and patient medications and                            allergies were reviewed. The patient's tolerance of                            previous anesthesia was also reviewed. The risks                            and benefits of the procedure and the sedation                            options and risks were discussed with the patient.                            All questions were answered, and informed consent                            was obtained. Prior Anticoagulants: The patient has                            taken Xarelto (rivaroxaban), last dose was 2 days                            prior to procedure. ASA Grade Assessment: II - A                            patient with mild systemic disease. After reviewing                            the risks and benefits, the patient was deemed in  satisfactory condition to undergo the procedure.                           After obtaining informed consent, the colonoscope                            was passed under direct vision. Throughout the                            procedure, the patient's blood pressure, pulse, and                            oxygen saturations were monitored continuously. The                            CF [id number] #[id number] was  introduced through the anus                            and advanced to the the cecum, identified by                            appendiceal orifice and ileocecal valve. The                            ileocecal valve, appendiceal orifice, and rectum                            were photographed. The quality of the bowel                            preparation was good. The colonoscopy was performed                            without difficulty. The patient tolerated the                            procedure well. Scope In: 2:48:45 PM Scope Out: 3:01:01 PM Scope Withdrawal Time: 0 hours 9 minutes 45 seconds  Total Procedure Duration: 0 hours 12 minutes 16 seconds  Findings:                 The perianal and digital rectal examinations were                            normal.                           A 5 mm polyp was found in the transverse colon. The                            polyp was sessile. The polyp was removed with a                            cold snare. Resection and retrieval were complete.  Multiple medium-mouthed and small-mouthed                            diverticula were found in the left colon. There was                            evidence of diverticular spasm. Peri-diverticular                            erythema was seen. There was no evidence of                            diverticular bleeding. Biopsies were taken with a                            cold forceps for histology.                           The exam was otherwise without abnormality on                            direct and retroflexion views. Complications:            No immediate complications. Estimated blood loss:                            None. Estimated Blood Loss:     Estimated blood loss: none. Impression:               - One 5 mm polyp in the transverse colon, removed                            with a cold snare. Resected and retrieved.                           - Moderate  diverticulosis in the left colon.                            Biopsied.                           - The examination was otherwise normal on direct                            and retroflexion views. Recommendation:           - Repeat colonoscopy after studies are complete for                            surveillance based on pathology results.                           - Patient has a contact number available for                            emergencies. The signs and symptoms of potential  delayed complications were discussed with the                            patient. Return to normal activities tomorrow.                            Written discharge instructions were provided to the                            patient.                           - High fiber diet.                           - Continue present medications.                           - Lialda 2.4 g po qd, 1 year of refills                           - Await pathology results.                           - Resume Xarelto (rivaroxaban) at prior dose                            tomorrow. Refer to managing physician for further                            adjustment of therapy.                           - Return to GI office in 2 months. Meryl Dare, MD 01/19/2023 3:16:34 PM This report has been signed electronically.

## 2023-01-19 NOTE — Progress Notes (Signed)
Dr Russella Dar informed of severe allergy contraindication that came up when tried to order Lialda  .Lialda not ordered per order Dr Russella Dar .

## 2023-01-19 NOTE — Progress Notes (Signed)
1510 HR > 100 with esmolol 25 mg given IV, MD updated, vss

## 2023-01-19 NOTE — Patient Instructions (Addendum)
RESUME XARELTO TOMORROW ( AT PRIOR DOSE)  Handout on hiatal hernia & gastritis given to you today  Await gastric biopsy results & esophageal biopsy results  FOLLOW ANTI RELUX REGIMEN- ORANGE Handout given to you today  Continue present medications    Handout on polyps & diverticulosis given to you today  Await pathology results on polyp removed & on sigmoid biopsies done   High fiber diet - handout given to you today  F/U with Dr.Stark in office in 2 months    YOU HAD AN ENDOSCOPIC PROCEDURE TODAY AT THE [facility]:   Refer to the procedure report that was given to you for any specific questions about what was found during the examination.  If the procedure report does not answer your questions, please call your gastroenterologist to clarify.  If you requested that your care partner not be given the details of your procedure findings, then the procedure report has been included in a sealed envelope for you to review at your convenience later.  YOU SHOULD EXPECT: Some feelings of bloating in the abdomen. Passage of more gas than usual.  Walking can help get rid of the air that was put into your GI tract during the procedure and reduce the bloating. If you had a lower endoscopy (such as a colonoscopy or flexible sigmoidoscopy) you may notice spotting of blood in your stool or on the toilet paper. If you underwent a bowel prep for your procedure, you may not have a normal bowel movement for a few days.  Please Note:  You might notice some irritation and congestion in your nose or some drainage.  This is from the oxygen used during your procedure.  There is no need for concern and it should clear up in a day or so.  SYMPTOMS TO REPORT IMMEDIATELY:  Following lower endoscopy (colonoscopy or flexible sigmoidoscopy):  Excessive amounts of blood in the stool  Significant tenderness or worsening of abdominal pains  Swelling of the abdomen that is new, acute  Fever of 100F or  higher  Following upper endoscopy (EGD)  Vomiting of blood or coffee ground material  New chest pain or pain under the shoulder blades  Painful or persistently difficult swallowing  New shortness of breath  Fever of 100F or higher  Black, tarry-looking stools  For urgent or emergent issues, a gastroenterologist can be reached at any hour by calling [phone]. Do not use MyChart messaging for urgent concerns.    DIET:  We do recommend a small meal at first, but then you may proceed to your regular diet.  Drink plenty of fluids but you should avoid alcoholic beverages for 24 hours.  ACTIVITY:  You should plan to take it easy for the rest of today and you should NOT DRIVE or use heavy machinery until tomorrow (because of the sedation medicines used during the test).    FOLLOW UP: Our staff will call the number listed on your records the next business day following your procedure.  We will call around 7:15- 8:00 am to check on you and address any questions or concerns that you may have regarding the information given to you following your procedure. If we do not reach you, we will leave a message.     If any biopsies were taken you will be contacted by phone or by letter within the next 1-3 weeks.  Please call us at [phone] if you have not heard about the biopsies in 3 weeks.  SIGNATURES/CONFIDENTIALITY: You and/or your care partner have signed paperwork which will be entered into your electronic medical record.  These signatures attest to the fact that that the information above on your After Visit Summary has been reviewed and is understood.  Full responsibility of the confidentiality of this discharge information lies with you and/or your care-partner.

## 2023-01-19 NOTE — Progress Notes (Signed)
History & Physical  Primary Care Physician:  Ofilia Neas, PA-C Primary Gastroenterologist: Claudette Head, MD  Impression / Plan:  Personal history of adenomatous colon polyps, short segment Barrett's esophagus for colonoscopy and EGD.  Xarelto held for 2 days prior to procedure.  CHIEF COMPLAINT:  CRC screening, Personal history of colon polyps   HPI: Benjamin [name] is a 52 y.o. male with a personal history of adenomatous colon polyps, short segment Barrett's esophagus for colonoscopy and EGD.  Xarelto held for 2 days prior to procedure.   Past Medical History:  Diagnosis Date   Acute DVT of left tibial vein (HCC) 04/24/2017   Xarelto started (dx'd in emergency dept).  Dr. Myna Hidalgo recommended full dose xarelto x 10mo, with an additional year of maintenance dose xarelto (as of 05/2017 initial consult note).  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  Pt Factor V leiden heterozygote.  Stable as of 09/2017 hem f/u.   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  Pt referred for sleep study.   Chronic renal insufficiency, stage II (mild)    GFR 60s-70s (suspect HTN and NSAID damage as etiology).  GFR dipped into 50s fall 2018 after lots of NSAID use for chronic daily HA's.   Diverticulitis 2016   CT 01/2015   Frequent headaches    Gastric polyp 2006   GERD without esophagitis    Herpes labialis     Hiatal hernia    Hyperlipidemia    HYPERTENSION 01/05/2007   OSA (obstructive sleep apnea) 04/2017   Dr. Frances Furbish eval 04/2017-plan for sleep study.   PSORIASIS 01/05/2007    Past Surgical History:  Procedure Laterality Date   COLONOSCOPY W/ POLYPECTOMY  10/04/15   7mm tubular adenoma + diverticulosis: recall 5 yrs (Dr. Russella Dar)   ESOPHAGOGASTRODUODENOSCOPY  04/12/14   Barrett's esophagus   LIPOMA EXCISION     several, arm    Prior to Admission medications   Medication Sig Start Date End Date Taking? Authorizing Provider  buPROPion (WELLBUTRIN XL) 150 MG 24 hr tablet Take 450 mg by mouth daily.    [provider]  dicyclomine (BENTYL) 10 MG capsule Take 1 capsule (10 mg total) by mouth 4 (four) times daily -  before meals and at bedtime. 10/07/22   Meryl Dare, MD  diltiazem (CARDIZEM CD) 240 MG 24 hr capsule TAKE 1 CAPSULE BY MOUTH EVERY DAY Patient taking differently: Take 240 mg by mouth daily. 02/19/21   Patwardhan, Anabel Bene, MD  esomeprazole (NEXIUM) 20 MG capsule Take 40 mg by mouth daily at 12 noon.    [provider]  fluconazole (DIFLUCAN) 150 MG tablet Take 150 mg by mouth once a week.    [provider]  HYDROcodone-acetaminophen (NORCO/VICODIN) 5-325 MG tablet Take 2 tablets by mouth every 4 (four) hours as needed. 12/21/22   Marita Kansas, PA-C  losartan (COZAAR) 25 MG  tablet Take 2 tablets (50 mg total) by mouth daily. 09/27/22 10/27/22  Briant Cedar, MD  metroNIDAZOLE (FLAGYL) 500 MG tablet Take 1 tablet (500 mg total) by mouth 2 (two) times daily. 12/21/22   Karie Mainland, Amjad, PA-C  ondansetron (ZOFRAN-ODT) 4 MG disintegrating tablet Take 1 tablet (4 mg total) by mouth every 8 (eight) hours as needed. 12/21/22   Karie Mainland, Amjad, PA-C  polyethylene glycol (MIRALAX / GLYCOLAX) 17 g packet Take 17 g by mouth daily as needed. 09/27/22   Briant Cedar, MD  rivaroxaban (XARELTO) 20 MG TABS tablet Take 1 tablet (20 mg total) by mouth daily with supper. 10/04/22    Rondel Baton, MD  rosuvastatin (CRESTOR) 40 MG tablet Take 1 tablet (40 mg total) by mouth once a week. Patient taking differently: Take 40 mg by mouth every Friday. 12/27/17   Ofilia Neas, PA-C  tamsulosin (FLOMAX) 0.4 MG CAPS capsule Take 1 capsule (0.4 mg total) by mouth daily. 04/05/18   Benjiman Core D, PA-C  tiZANidine (ZANAFLEX) 4 MG tablet Take 4 mg by mouth as directed.    [provider]  valACYclovir (VALTREX) 500 MG tablet Take 1 tablet (500 mg total) by mouth daily. 12/30/17   Ofilia Neas, PA-C  lisinopril-hydrochlorothiazide (PRINZIDE,ZESTORETIC) 20-25 MG per tablet Take 1 tablet by mouth daily. 07/24/11 08/21/11  Gordy Savers, MD    Current Outpatient Medications  Medication Sig Dispense Refill   buPROPion (WELLBUTRIN XL) 150 MG 24 hr tablet Take 450 mg by mouth daily.     dicyclomine (BENTYL) 10 MG capsule Take 1 capsule (10 mg total) by mouth 4 (four) times daily -  before meals and at bedtime. 120 capsule 11   diltiazem (CARDIZEM CD) 240 MG 24 hr capsule TAKE 1 CAPSULE BY MOUTH EVERY DAY (Patient taking differently: Take 240 mg by mouth daily.) 90 capsule 1   esomeprazole (NEXIUM) 20 MG capsule Take 40 mg by mouth daily at 12 noon.     fluconazole (DIFLUCAN) 150 MG tablet Take 150 mg by mouth once a week.     HYDROcodone-acetaminophen (NORCO/VICODIN) 5-325 MG tablet Take 2 tablets by mouth every 4 (four) hours as needed. 10 tablet 0   losartan (COZAAR) 25 MG tablet Take 2 tablets (50 mg total) by mouth daily. 60 tablet 0   metroNIDAZOLE (FLAGYL) 500 MG tablet Take 1 tablet (500 mg total) by mouth 2 (two) times daily. 14 tablet 0   ondansetron (ZOFRAN-ODT) 4 MG disintegrating tablet Take 1 tablet (4 mg total) by mouth every 8 (eight) hours as needed. 20 tablet 0   polyethylene glycol (MIRALAX / GLYCOLAX) 17 g packet Take 17 g by mouth daily as needed. 14 each 0   rivaroxaban (XARELTO) 20 MG TABS tablet Take 1 tablet (20 mg total) by mouth daily  with supper. 30 tablet 0   rosuvastatin (CRESTOR) 40 MG tablet Take 1 tablet (40 mg total) by mouth once a week. (Patient taking differently: Take 40 mg by mouth every Friday.) 4 tablet 12   tamsulosin (FLOMAX) 0.4 MG CAPS capsule Take 1 capsule (0.4 mg total) by mouth daily. 90 capsule 1   tiZANidine (ZANAFLEX) 4 MG tablet Take 4 mg by mouth as directed.     valACYclovir (VALTREX) 500 MG tablet Take 1 tablet (500 mg total) by mouth daily. 30 tablet 12   Current Facility-Administered Medications  Medication Dose Route Frequency Provider Last Rate Last Admin   0.9 %  sodium chloride infusion  500  mL Intravenous Once Meryl Dare, MD       Facility-Administered Medications Ordered in Other Visits  Medication Dose Route Frequency Provider Last Rate Last Admin   gadopentetate dimeglumine (MAGNEVIST) injection 18 mL  18 mL Intravenous Once PRN Anson Fret, MD        Allergies as of 01/19/2023 - Review Complete 01/19/2023  Allergen Reaction Noted   Bismuth subsalicylate Swelling and Other (See Comments) 08/09/2015   Compazine [prochlorperazine edisylate] Other (See Comments) 03/24/2017    Family History  Problem Relation Age of Onset   CAD Father        around age 85   Heart disease Father    Hyperlipidemia Father    Hypertension Father    CAD Paternal Grandmother    Diabetes Paternal Grandmother    Hypertension Mother    Hypertension Brother    Hyperlipidemia Brother    Lung cancer Maternal Grandmother    Cancer Maternal Grandfather        type unknown   Colon cancer Other        neg hx   Prostate cancer Other        neg hx    Social History   Socioeconomic History   Marital status: Single    Spouse name: Not on file   Number of children: 3   Years of education: Not on file   Highest education level: Not on file  Occupational History   Occupation: regional Mudlogger    Comment: Industrial/product designer  Tobacco Use   Smoking status: Never   Smokeless tobacco:  Never  Vaping Use   Vaping status: Never Used  Substance and Sexual Activity   Alcohol use: Yes    Alcohol/week: 2.0 standard drinks of alcohol    Types: 2 Cans of beer per week    Comment: rare   Drug use: No   Sexual activity: Yes    Birth control/protection: None  Other Topics Concern   Not on file  Social History Narrative   Currently separated as of 01/2014, 2 children.  One younger brother.   Occupation: Airline pilot for [employer]   No tobacco.   Occ alcohol (1-2 x/month).  No hx of alc/drug prob.   Exercise: runs about 2 times a week. Not as much lately due to medical state   Diet: regular american diet.   Right handed   1 cup coffee daily      Social Determinants of Health   Financial Resource Strain: Not on file  Food Insecurity: No Food Insecurity (09/22/2022)   Hunger Vital Sign    Worried About Running Out of Food in the Last Year: Never true    Ran Out of Food in the Last Year: Never true  Transportation Needs: No Transportation Needs (09/22/2022)   PRAPARE - Administrator, Civil Service (Medical): No    Lack of Transportation (Non-Medical): No  Physical Activity: Not on file  Stress: Not on file  Social Connections: Unknown (10/14/2021)   Received from [facility]   Social Network    Social Network: Not on file  Intimate Partner Violence: Not At Risk (09/22/2022)   Humiliation, Afraid, Rape, and Kick questionnaire    Fear of Current or Ex-Partner: No    Emotionally Abused: No    Physically Abused: No    Sexually Abused: No    Review of Systems:  All systems reviewed were negative except where noted in HPI.   Physical Exam:  General:  Alert, well-developed, in NAD Head:  Normocephalic and atraumatic. Eyes:  Sclera clear, no icterus.   Conjunctiva pink. Ears:  Normal auditory acuity. Mouth:  No deformity or lesions.  Neck:  Supple; no masses. Lungs:  Clear throughout to auscultation.   No wheezes, crackles, or rhonchi.  Heart:   Regular rate and rhythm; no murmurs. Abdomen:  Soft, nondistended, nontender. No masses, hepatomegaly. No palpable masses.  Normal bowel sounds.    Rectal:  Deferred   Msk:  Symmetrical without gross deformities. Extremities:  Without edema. Neurologic:  Alert and  oriented x 4; grossly normal neurologically. Skin:  Intact without significant lesions or rashes. Psych:  Alert and cooperative. Normal mood and affect.   Venita Lick. Russella Dar  01/19/2023, 2:24 PM See Loretha Stapler, [facility], to contact our on call provider

## 2023-01-19 NOTE — Progress Notes (Signed)
1436 Robinul 0.1 mg IV given due large amount of secretions upon assessment.  MD made aware, vss

## 2023-01-19 NOTE — Progress Notes (Signed)
1453 HR > 100 with esmolol 25 mg given IV, MD updated, vss

## 2023-01-19 NOTE — Progress Notes (Signed)
Report given to PACU, vss 

## 2023-01-20 ENCOUNTER — Telehealth: Payer: Self-pay

## 2023-01-20 ENCOUNTER — Other Ambulatory Visit: Payer: Self-pay

## 2023-01-20 MED ORDER — MESALAMINE 1.2 G PO TBEC: 2.4000 g | DELAYED_RELEASE_TABLET | Freq: Every day | ORAL | 11 refills | Status: DC

## 2023-01-20 NOTE — Telephone Encounter (Signed)
  Follow up Call-     01/19/2023    2:13 PM  Call back number  Post procedure Call Back phone  # [phone]  Permission to leave phone message Yes     Patient questions:  Do you have a fever, pain , or abdominal swelling? No. Pain Score  0 *  Have you tolerated food without any problems? Yes.    Have you been able to return to your normal activities? Yes.    Do you have any questions about your discharge instructions: Diet   No. Medications  No. Follow up visit  No.  Do you have questions or concerns about your Care? No.  Actions: * If pain score is 4 or above: No action needed, pain <4.

## 2023-01-26 ENCOUNTER — Encounter: Payer: Self-pay | Admitting: Gastroenterology

## 2023-04-06 ENCOUNTER — Encounter: Payer: Self-pay | Admitting: Gastroenterology

## 2023-04-06 ENCOUNTER — Ambulatory Visit (INDEPENDENT_AMBULATORY_CARE_PROVIDER_SITE_OTHER): Payer: BC Managed Care – PPO | Admitting: Gastroenterology

## 2023-04-06 VITALS — BP 124/80 | HR 119 | Ht 69.0 in | Wt 191.0 lb

## 2023-04-06 DIAGNOSIS — K2270 Barrett's esophagus without dysplasia: Secondary | ICD-10-CM

## 2023-04-06 DIAGNOSIS — K5732 Diverticulitis of large intestine without perforation or abscess without bleeding: Secondary | ICD-10-CM | POA: Diagnosis not present

## 2023-04-06 NOTE — Patient Instructions (Signed)
Follow up with Dr. Doy Hutching in one year.   The [facility] providers would like to encourage you to use [facility] to communicate with providers for non-urgent requests or questions.  Due to long hold times on the telephone, sending your provider a message by [facility] may be a faster and more efficient way to get a response.  Please allow 48 business hours for a response.  Please remember that this is for non-urgent requests.   Thank you for choosing me and [facility].  Venita Lick. Pleas Koch., MD., Clementeen Graham

## 2023-04-06 NOTE — Progress Notes (Signed)
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 Doy Hutching   HPI    This is a 52 year old male returning for follow-up of diverticulitis and Barrett's esophagus.  Since his colonoscopy he had 1 episode of mild diverticulitis treated with a 5-day course of Augmentin by his PCP.  His reflux symptoms are under good control.  He does not recall filling or taking Lialda.  Colonoscopy Aug 2024 - One 5 mm polyp in the transverse colon, removed with a cold snare. Resected and retrieved.  - Moderate diverticulosis in the left colon. Biopsied.  - The examination was otherwise normal on direct and retroflexion views.   EGD Aug 2024  - Esophageal mucosal changes consistent with short-segment Barrett's esophagus. Biopsied.  - Small hiatal hernia.  - Gastritis. Biopsied.  - Normal duodenal bulb and second portion of the duodenum.   Labs / Imaging       Latest Ref Rng & Units 12/21/2022   10:25 AM 11/14/2022    9:33 AM 10/27/2022   10:22 AM  Hepatic Function  Total Protein 6.5 - 8.1 g/dL 8.0  7.0  7.2   Albumin 3.5 - 5.0 g/dL 4.6  4.2  4.7   AST 15 - 41 U/L 13  10  16    ALT 0 - 44 U/L 12  16  15    Alk Phosphatase 38 - 126 U/L 64  58  81   Total Bilirubin 0.3 - 1.2 mg/dL 1.7  0.6  0.6        Latest Ref Rng & Units 12/21/2022   10:25 AM 11/14/2022    9:33 AM 10/27/2022   10:22 AM  CBC  WBC 4.0 - 10.5 K/uL 16.8  16.6  7.3   Hemoglobin 13.0 - 17.0 g/dL 40.1  02.7  25.3   Hematocrit 39.0 - 52.0 % 44.6  44.4  42.8    Platelets 150 - 400 K/uL 377  440  258      CT ABDOMEN PELVIS W CONTRAST CLINICAL DATA:  Abdominal pain. Acute onset. History of diverticulitis.  EXAM: CT ABDOMEN AND PELVIS WITH CONTRAST  TECHNIQUE: Multidetector CT imaging of the abdomen and pelvis was performed using the standard protocol following bolus administration of intravenous contrast.  RADIATION DOSE REDUCTION: This exam was performed according to the departmental dose-optimization program which includes automated exposure control, adjustment of the mA and/or kV according to patient size and/or use of iterative reconstruction technique.  CONTRAST:  85mL OMNIPAQUE IOHEXOL 300 MG/ML  SOLN  COMPARISON:  None Available.  FINDINGS: Lower chest: Lung bases are clear.  Hepatobiliary: No focal hepatic lesion. Normal gallbladder. No biliary duct dilatation. Common bile duct is normal.  Pancreas: Pancreas is normal. No ductal dilatation. No pancreatic inflammation.  Spleen: Normal spleen  Adrenals/urinary tract: Adrenal glands and kidneys are normal. The ureters and bladder normal.  Stomach/Bowel: Stomach, small bowel, appendix, and cecum are normal.  A pericolonic inflammation surrounding the proximal sigmoid colon. There is thickening of the peritoneal reflection adjacent to the colonic inflammation.  Multiple diverticula through this region. No perforation or abscess identified. (Image [id number] of series 2)  Rectum normal.  Vascular/Lymphatic: Abdominal aorta is normal caliber. There is no retroperitoneal or periportal lymphadenopathy. No pelvic lymphadenopathy.  Reproductive: Prostate unremarkable  Other: No free fluid.  Musculoskeletal: No aggressive osseous lesion.  IMPRESSION: 1. Acute diverticulitis of the proximal sigmoid colon. No perforation or abscess. 2. Normal appendix. 3. Normal gallbladder and biliary tree.  Electronically Signed   By: Genevive Bi M.D.   On: 12/21/2022  12:43   Current Medications, Allergies, Past Medical History, Past Surgical History, Family History and Social History were reviewed in [employer] record.   Physical Exam: General: Well developed, well nourished, no acute distress Head: Normocephalic and atraumatic Eyes: Sclerae anicteric, EOMI Ears: Normal auditory acuity Mouth: No deformities or lesions noted Lungs: Clear throughout to auscultation Heart: Regular rate and rhythm; No murmurs, rubs or bruits Abdomen: Soft, non tender and non distended. No masses, hepatosplenomegaly or hernias noted. Normal Bowel sounds Rectal: Not done Musculoskeletal: Symmetrical with no gross deformities  Pulses:  Normal pulses noted Extremities: No edema or deformities noted Neurological: Alert oriented x 4, grossly nonfocal Psychological:  Alert and cooperative. Normal mood and affect   Falan Hensler T. Russella Dar, MD 04/06/2023, 2:44 PM

## 2023-07-19 ENCOUNTER — Emergency Department (HOSPITAL_BASED_OUTPATIENT_CLINIC_OR_DEPARTMENT_OTHER)
Admission: EM | Admit: 2023-07-19 | Discharge: 2023-07-19 | Disposition: A | Discharge status: Home / Self Care | Payer: BC Managed Care – PPO | Attending: Emergency Medicine | Admitting: Emergency Medicine

## 2023-07-19 ENCOUNTER — Encounter (HOSPITAL_BASED_OUTPATIENT_CLINIC_OR_DEPARTMENT_OTHER): Payer: Self-pay | Admitting: Emergency Medicine

## 2023-07-19 ENCOUNTER — Other Ambulatory Visit: Payer: Self-pay

## 2023-07-19 ENCOUNTER — Emergency Department (HOSPITAL_BASED_OUTPATIENT_CLINIC_OR_DEPARTMENT_OTHER): Payer: BC Managed Care – PPO

## 2023-07-19 DIAGNOSIS — Z7901 Long term (current) use of anticoagulants: Secondary | ICD-10-CM | POA: Diagnosis not present

## 2023-07-19 DIAGNOSIS — D72829 Elevated white blood cell count, unspecified: Secondary | ICD-10-CM | POA: Diagnosis not present

## 2023-07-19 DIAGNOSIS — R519 Headache, unspecified: Secondary | ICD-10-CM | POA: Diagnosis present

## 2023-07-19 DIAGNOSIS — Z79899 Other long term (current) drug therapy: Secondary | ICD-10-CM | POA: Insufficient documentation

## 2023-07-19 LAB — CBC
HCT: 47.4 % (ref 39.0–52.0)
Hemoglobin: 16.5 g/dL (ref 13.0–17.0)
MCH: 30.4 pg (ref 26.0–34.0)
MCHC: 34.8 g/dL (ref 30.0–36.0)
MCV: 87.5 fL (ref 80.0–100.0)
Platelets: 394 10*3/uL (ref 150–400)
RBC: 5.42 MIL/uL (ref 4.22–5.81)
RDW: 12.2 % (ref 11.5–15.5)
WBC: 11.3 10*3/uL — ABNORMAL HIGH (ref 4.0–10.5)
nRBC: 0 % (ref 0.0–0.2)

## 2023-07-19 LAB — BASIC METABOLIC PANEL
Anion gap: 11 (ref 5–15)
BUN: 15 mg/dL (ref 6–20)
CO2: 24 mmol/L (ref 22–32)
Calcium: 9.5 mg/dL (ref 8.9–10.3)
Chloride: 103 mmol/L (ref 98–111)
Creatinine, Ser: 1.15 mg/dL (ref 0.61–1.24)
GFR, Estimated: >60 mL/min (ref >60)
Glucose, Bld: 112 mg/dL — ABNORMAL HIGH (ref 70–99)
Potassium: 3.9 mmol/L (ref 3.5–5.1)
Sodium: 138 mmol/L (ref 135–145)

## 2023-07-19 MED ORDER — HYDROCODONE-ACETAMINOPHEN 5-325 MG PO TABS: 2.0000 | ORAL_TABLET | ORAL | 0 refills | Status: DC | PRN

## 2023-07-19 MED ORDER — ONDANSETRON HCL 4 MG/2ML IJ SOLN
4.0000 mg | Freq: Once | INTRAMUSCULAR | Status: AC
  Administered 2023-07-19: 4 mg via INTRAVENOUS
  Filled 2023-07-19: qty 2

## 2023-07-19 MED ORDER — SODIUM CHLORIDE 0.9 % IV BOLUS
500.0000 mL | Freq: Once | INTRAVENOUS | Status: AC
  Administered 2023-07-19: 500 mL via INTRAVENOUS

## 2023-07-19 MED ORDER — HYDROMORPHONE HCL 1 MG/ML IJ SOLN
1.0000 mg | Freq: Once | INTRAMUSCULAR | Status: AC
  Administered 2023-07-19: 1 mg via INTRAVENOUS
  Filled 2023-07-19: qty 1

## 2023-07-19 MED ORDER — SUMATRIPTAN SUCCINATE 50 MG PO TABS: 50.0000 mg | ORAL_TABLET | Freq: Once | ORAL | 0 refills | Status: DC | PRN

## 2023-07-19 NOTE — ED Provider Notes (Signed)
[place] EMERGENCY DEPARTMENT AT [facility] [place] Provider Note   CSN: [id number] Arrival date & time: 07/19/23  1350     History  No chief complaint on file.   Benjamin [name] is a 53 y.o. male.  Patient complains of headache.  Patient reports he began having a headache last night.  Patient complains of pain in his head and soreness in the back of his neck.  Patient denies any fever or chills he has not had any cough he has not had any congestion.  He denies any body aches or flulike symptoms.  Patient reports that he has had headaches in the past he was on Maxalt in the past to prevent headaches.  Patient has a past medical history of diverticulitis and DVTs.  Patient is currently on Xarelto.  Patient reports he has not missed any dosages.  Patient denies anyVisual changes.  Patient denies any hearing changes.  He has not had any weakness.  Patient denies any chest pain or abdominal pain.  He has not had any nausea or vomiting.  The history is provided by the patient. No language interpreter was used.       Home Medications Prior to Admission medications   Medication Sig Start Date End Date Taking? Authorizing Provider  SUMAtriptan (IMITREX) 50 MG tablet Take 1 tablet (50 mg total) by mouth once as needed for migraine. May repeat in 2 hours if headache persists or recurs. 07/19/23 07/19/24 Yes Elson Areas, PA-C  buPROPion (WELLBUTRIN XL) 150 MG 24 hr tablet Take 450 mg by mouth daily.    [provider]  dicyclomine (BENTYL) 10 MG capsule Take 1 capsule (10 mg total) by mouth 4 (four) times daily -  before meals and at bedtime. 10/07/22   Meryl Dare, MD  diltiazem (CARDIZEM CD) 240 MG 24 hr capsule TAKE 1 CAPSULE BY MOUTH EVERY DAY Patient taking differently: Take 240 mg by mouth daily. 02/19/21   Patwardhan, Anabel Bene, MD  esomeprazole (NEXIUM) 20 MG capsule Take 40 mg by mouth daily at 12 noon.    [provider]  HYDROcodone-acetaminophen  (NORCO/VICODIN) 5-325 MG tablet Take 2 tablets by mouth every 4 (four) hours as needed. 07/19/23   Elson Areas, PA-C  losartan (COZAAR) 25 MG tablet Take 2 tablets (50 mg total) by mouth daily. 09/27/22 01/19/23  Briant Cedar, MD  mesalamine (LIALDA) 1.2 g EC tablet Take 2 tablets (2.4 g total) by mouth daily with breakfast. 01/20/23 02/19/23  Meryl Dare, MD  metroNIDAZOLE (FLAGYL) 500 MG tablet Take 1 tablet (500 mg total) by mouth 2 (two) times daily. 12/21/22   Karie Mainland, Amjad, PA-C  ondansetron (ZOFRAN-ODT) 4 MG disintegrating tablet Take 1 tablet (4 mg total) by mouth every 8 (eight) hours as needed. 12/21/22   Karie Mainland, Amjad, PA-C  polyethylene glycol (MIRALAX / GLYCOLAX) 17 g packet Take 17 g by mouth daily as needed. 09/27/22   Briant Cedar, MD  rivaroxaban (XARELTO) 20 MG TABS tablet Take 1 tablet (20 mg total) by mouth daily with supper. 10/04/22   Rondel Baton, MD  rosuvastatin (CRESTOR) 40 MG tablet Take 1 tablet (40 mg total) by mouth once a week. Patient taking differently: Take 40 mg by mouth every Friday. 12/27/17   Ofilia Neas, PA-C  tamsulosin (FLOMAX) 0.4 MG CAPS capsule Take 1 capsule (0.4 mg total) by mouth daily. 04/05/18   Benjiman Core D, PA-C  tiZANidine (ZANAFLEX) 4 MG tablet Take 4 mg by  mouth as directed.    [provider]  valACYclovir (VALTREX) 500 MG tablet Take 1 tablet (500 mg total) by mouth daily. 12/30/17   Ofilia Neas, PA-C  lisinopril-hydrochlorothiazide (PRINZIDE,ZESTORETIC) 20-25 MG per tablet Take 1 tablet by mouth daily. 07/24/11 08/21/11  Gordy Savers, MD      Allergies    Bismuth subsalicylate and Compazine [prochlorperazine edisylate]    Review of Systems   Review of Systems  All other systems reviewed and are negative.   Physical Exam Updated Vital Signs BP (!) 150/108 (BP Location: Right Arm)   Pulse 66   Temp 97.9 F (36.6 C) (Oral)   Resp 18   SpO2 100%  Physical Exam Vitals and nursing note  reviewed.  Constitutional:      General: He is not in acute distress.    Appearance: He is well-developed.  HENT:     Head: Normocephalic and atraumatic.     Right Ear: Tympanic membrane normal.     Left Ear: Tympanic membrane normal.     Nose: Nose normal.     Mouth/Throat:     Mouth: Mucous membranes are moist.  Eyes:     Conjunctiva/sclera: Conjunctivae normal.  Cardiovascular:     Rate and Rhythm: Normal rate and regular rhythm.     Heart sounds: No murmur heard. Pulmonary:     Effort: Pulmonary effort is normal. No respiratory distress.     Breath sounds: Normal breath sounds.  Abdominal:     Palpations: Abdomen is soft.     Tenderness: There is no abdominal tenderness.  Musculoskeletal:        General: No swelling. Normal range of motion.     Cervical back: Neck supple.  Skin:    General: Skin is warm and dry.     Capillary Refill: Capillary refill takes less than 2 seconds.  Neurological:     General: No focal deficit present.     Mental Status: He is alert.  Psychiatric:        Mood and Affect: Mood normal.     ED Results / Procedures / Treatments   Labs (all labs ordered are listed, but only abnormal results are displayed) Labs Reviewed  CBC - Abnormal; Notable for the following components:      Result Value   WBC 11.3 (*)    All other components within normal limits  BASIC METABOLIC PANEL - Abnormal; Notable for the following components:   Glucose, Bld 112 (*)    All other components within normal limits    EKG None  Radiology CT Head Wo Contrast Result Date: 07/19/2023 CLINICAL DATA:  New onset headache on the right. EXAM: CT HEAD WITHOUT CONTRAST TECHNIQUE: Contiguous axial images were obtained from the base of the skull through the vertex without intravenous contrast. RADIATION DOSE REDUCTION: This exam was performed according to the departmental dose-optimization program which includes automated exposure control, adjustment of the mA and/or kV  according to patient size and/or use of iterative reconstruction technique. COMPARISON:  None Available. FINDINGS: Brain: The brain shows a normal appearance without evidence of malformation, atrophy, old or acute small or large vessel infarction, mass lesion, hemorrhage, hydrocephalus or extra-axial collection. Vascular: No hyperdense vessel. No evidence of atherosclerotic calcification. Skull: Normal.  No traumatic finding.  No focal bone lesion. Sinuses/Orbits: Sinuses are clear. Orbits appear normal. Mastoids are clear. Other: None significant IMPRESSION: Normal head CT. Electronically Signed   By: Paulina Fusi M.D.   On: 07/19/2023 17:52  Procedures Procedures    Medications Ordered in ED Medications  ondansetron (ZOFRAN) injection 4 mg (4 mg Intravenous Given 07/19/23 1420)  HYDROmorphone (DILAUDID) injection 1 mg (1 mg Intravenous Given 07/19/23 1656)  ondansetron (ZOFRAN) injection 4 mg (4 mg Intravenous Given 07/19/23 1655)  sodium chloride 0.9 % bolus 500 mL (500 mLs Intravenous New Bag/Given 07/19/23 1655)    ED Course/ Medical Decision Making/ A&P                                 Medical Decision Making Pt complains of a headache.    Amount and/or Complexity of Data Reviewed Labs: ordered. Decision-making details documented in ED Course.    Details: CBC and be met show normal hemoglobin.  White blood cell count is 11.3 glucose is 112. Radiology: ordered and independent interpretation performed. Decision-making details documented in ED Course.    Details: CT head shows no acute findings.  Risk Prescription drug management. Risk Details: Patient given IV fluids x 500 cc.  He is given Dilaudid 1 mg and Zofran.  Patient reevaluated he reports he is feeling some better.  I advised him to follow-up with his primary care physician.  I offered to try him on Imitrex.  Patient is requesting medication for pain he states he does well with hydrocodone.  Patient is prescribed 10 tablets of  hydrocodone.           Final Clinical Impression(s) / ED Diagnoses Final diagnoses:  Acute nonintractable headache, unspecified headache type    Rx / DC Orders ED Discharge Orders          Ordered    HYDROcodone-acetaminophen (NORCO/VICODIN) 5-325 MG tablet  Every 4 hours PRN        07/19/23 1841    SUMAtriptan (IMITREX) 50 MG tablet  Once PRN        07/19/23 1845           An After Visit Summary was printed and given to the patient.    Elson Areas, Cordelia Poche 07/19/23 Carlis Stable    Gwyneth Sprout, MD 07/21/23 [phone]

## 2023-07-19 NOTE — ED Triage Notes (Addendum)
Right sided headache starting last night. N. Not helped by hydrocodone. Endorses neck pain through ROM and with palpation. Denies falls/trauma. Blurry vision. On xarelto for multiple clots.

## 2023-07-19 NOTE — ED Notes (Signed)
 Out to CT

## 2023-08-12 ENCOUNTER — Emergency Department (HOSPITAL_BASED_OUTPATIENT_CLINIC_OR_DEPARTMENT_OTHER)

## 2023-08-12 ENCOUNTER — Emergency Department (HOSPITAL_BASED_OUTPATIENT_CLINIC_OR_DEPARTMENT_OTHER)
Admission: EM | Admit: 2023-08-12 | Discharge: 2023-08-12 | Disposition: A | Discharge status: Home / Self Care | Attending: Emergency Medicine | Admitting: Emergency Medicine

## 2023-08-12 ENCOUNTER — Encounter (HOSPITAL_BASED_OUTPATIENT_CLINIC_OR_DEPARTMENT_OTHER): Payer: Self-pay | Admitting: Emergency Medicine

## 2023-08-12 ENCOUNTER — Other Ambulatory Visit: Payer: Self-pay

## 2023-08-12 DIAGNOSIS — R112 Nausea with vomiting, unspecified: Secondary | ICD-10-CM | POA: Insufficient documentation

## 2023-08-12 DIAGNOSIS — R000 Tachycardia, unspecified: Secondary | ICD-10-CM | POA: Insufficient documentation

## 2023-08-12 DIAGNOSIS — Z79899 Other long term (current) drug therapy: Secondary | ICD-10-CM | POA: Insufficient documentation

## 2023-08-12 DIAGNOSIS — K219 Gastro-esophageal reflux disease without esophagitis: Secondary | ICD-10-CM | POA: Diagnosis not present

## 2023-08-12 DIAGNOSIS — Z7901 Long term (current) use of anticoagulants: Secondary | ICD-10-CM | POA: Diagnosis not present

## 2023-08-12 DIAGNOSIS — R197 Diarrhea, unspecified: Secondary | ICD-10-CM | POA: Diagnosis not present

## 2023-08-12 DIAGNOSIS — R1084 Generalized abdominal pain: Secondary | ICD-10-CM | POA: Diagnosis not present

## 2023-08-12 DIAGNOSIS — I10 Essential (primary) hypertension: Secondary | ICD-10-CM | POA: Diagnosis not present

## 2023-08-12 LAB — URINALYSIS, ROUTINE W REFLEX MICROSCOPIC
Bacteria, UA: NONE SEEN
Bilirubin Urine: NEGATIVE
Glucose, UA: NEGATIVE mg/dL
Ketones, ur: NEGATIVE mg/dL
Leukocytes,Ua: NEGATIVE
Nitrite: NEGATIVE
Protein, ur: NEGATIVE mg/dL
Specific Gravity, Urine: 1.029 (ref 1.005–1.030)
pH: 6.5 (ref 5.0–8.0)

## 2023-08-12 LAB — MAGNESIUM: Magnesium: 1.9 mg/dL (ref 1.7–2.4)

## 2023-08-12 LAB — CBC WITH DIFFERENTIAL/PLATELET
Abs Immature Granulocytes: 0.04 10*3/uL (ref 0.00–0.07)
Basophils Absolute: 0 10*3/uL (ref 0.0–0.1)
Basophils Relative: 0 %
Eosinophils Absolute: 0.2 10*3/uL (ref 0.0–0.5)
Eosinophils Relative: 2 %
HCT: 47.7 % (ref 39.0–52.0)
Hemoglobin: 16.9 g/dL (ref 13.0–17.0)
Immature Granulocytes: 0 %
Lymphocytes Relative: 3 %
Lymphs Abs: 0.3 10*3/uL — ABNORMAL LOW (ref 0.7–4.0)
MCH: 30.7 pg (ref 26.0–34.0)
MCHC: 35.4 g/dL (ref 30.0–36.0)
MCV: 86.6 fL (ref 80.0–100.0)
Monocytes Absolute: 0.4 10*3/uL (ref 0.1–1.0)
Monocytes Relative: 4 %
Neutro Abs: 9.3 10*3/uL — ABNORMAL HIGH (ref 1.7–7.7)
Neutrophils Relative %: 91 %
Platelets: 379 10*3/uL (ref 150–400)
RBC: 5.51 MIL/uL (ref 4.22–5.81)
RDW: 12.1 % (ref 11.5–15.5)
Smear Review: ADEQUATE
WBC: 10.3 10*3/uL (ref 4.0–10.5)
nRBC: 0 % (ref 0.0–0.2)

## 2023-08-12 LAB — COMPREHENSIVE METABOLIC PANEL
ALT: 16 U/L (ref 0–44)
AST: 15 U/L (ref 15–41)
Albumin: 4.8 g/dL (ref 3.5–5.0)
Alkaline Phosphatase: 66 U/L (ref 38–126)
Anion gap: 12 (ref 5–15)
BUN: 21 mg/dL — ABNORMAL HIGH (ref 6–20)
CO2: 21 mmol/L — ABNORMAL LOW (ref 22–32)
Calcium: 9.2 mg/dL (ref 8.9–10.3)
Chloride: 106 mmol/L (ref 98–111)
Creatinine, Ser: 1.17 mg/dL (ref 0.61–1.24)
GFR, Estimated: >60 mL/min (ref >60)
Glucose, Bld: 118 mg/dL — ABNORMAL HIGH (ref 70–99)
Potassium: 3.5 mmol/L (ref 3.5–5.1)
Sodium: 139 mmol/L (ref 135–145)
Total Bilirubin: 1.1 mg/dL (ref 0.0–1.2)
Total Protein: 7.6 g/dL (ref 6.5–8.1)

## 2023-08-12 LAB — RESP PANEL BY RT-PCR (RSV, FLU A&B, COVID)  RVPGX2
Influenza A by PCR: NEGATIVE
Influenza B by PCR: NEGATIVE
Resp Syncytial Virus by PCR: NEGATIVE
SARS Coronavirus 2 by RT PCR: NEGATIVE

## 2023-08-12 LAB — LIPASE, BLOOD: Lipase: <10 U/L — ABNORMAL LOW (ref 11–51)

## 2023-08-12 MED ORDER — POTASSIUM CHLORIDE CRYS ER 20 MEQ PO TBCR
40.0000 meq | EXTENDED_RELEASE_TABLET | Freq: Once | ORAL | Status: AC
  Administered 2023-08-12: 40 meq via ORAL
  Filled 2023-08-12: qty 2

## 2023-08-12 MED ORDER — SODIUM CHLORIDE 0.9 % IV BOLUS
1000.0000 mL | Freq: Once | INTRAVENOUS | Status: AC
  Administered 2023-08-12: 1000 mL via INTRAVENOUS

## 2023-08-12 MED ORDER — IOHEXOL 300 MG/ML  SOLN
100.0000 mL | Freq: Once | INTRAMUSCULAR | Status: AC | PRN
  Administered 2023-08-12: 100 mL via INTRAVENOUS

## 2023-08-12 MED ORDER — HYDROMORPHONE HCL 1 MG/ML IJ SOLN
0.5000 mg | Freq: Once | INTRAMUSCULAR | Status: AC
  Administered 2023-08-12: 0.5 mg via INTRAVENOUS
  Filled 2023-08-12: qty 1

## 2023-08-12 MED ORDER — ONDANSETRON 4 MG PO TBDP: 4.0000 mg | ORAL_TABLET | Freq: Three times a day (TID) | ORAL | 0 refills | Status: DC | PRN

## 2023-08-12 MED ORDER — ACETAMINOPHEN 500 MG PO TABS
1000.0000 mg | ORAL_TABLET | Freq: Once | ORAL | Status: AC
  Administered 2023-08-12: 1000 mg via ORAL
  Filled 2023-08-12: qty 2

## 2023-08-12 MED ORDER — HYDROCODONE-ACETAMINOPHEN 5-325 MG PO TABS: 1.0000 | ORAL_TABLET | Freq: Four times a day (QID) | ORAL | 0 refills | Status: DC | PRN

## 2023-08-12 MED ORDER — ONDANSETRON HCL 4 MG/2ML IJ SOLN
4.0000 mg | Freq: Once | INTRAMUSCULAR | Status: AC
  Administered 2023-08-12: 4 mg via INTRAVENOUS
  Filled 2023-08-12: qty 2

## 2023-08-12 NOTE — ED Notes (Signed)
 Patient transported to CT

## 2023-08-12 NOTE — ED Notes (Signed)
 Transported to ct

## 2023-08-12 NOTE — ED Triage Notes (Signed)
 Pt via pov from home with emesis and abdominal pain as well as headache  since last night. Pt has hx of diverticulitis and feels that is part of the issue. His daughter is in ICU at [facility] with similar symptoms - possible norovirus. Pt alert & oriented, nad noted.

## 2023-08-12 NOTE — ED Notes (Signed)
 Dc instructions reviewed with patient. Patient voiced understanding. Dc with belongings.

## 2023-08-12 NOTE — ED Provider Notes (Signed)
 [place] EMERGENCY DEPARTMENT AT [facility] Provider Note   CSN: [id number] Arrival date & time: 08/12/23  1116     History  Chief Complaint  Patient presents with   Emesis    abd   Abdominal Pain    Benjamin [name] is a 53 y.o. male.  With history of hypertension, GERD, previous diverticulitis, hyperlipidemia, anxiety presenting to the ED for evaluation of nausea, vomiting, diarrhea, abdominal pain.  Symptoms began acutely at 3 AM today.  States his daughter has similar symptoms and was admitted to [facility] yesterday.  He reports 10 episodes of emesis today and 4 episodes of diarrhea.  No hematemesis.  No melena or hematochezia.  No fevers.  Abdominal pain is diffuse but worse on the left, consistent with previous episodes of diverticulitis.  He denies any chest pain or shortness of breath.  He states he feels weak.  He denies recent travel, antibiotic use or medication changes.   Emesis Associated symptoms: abdominal pain and diarrhea   Abdominal Pain Associated symptoms: diarrhea, nausea and vomiting        Home Medications Prior to Admission medications   Medication Sig Start Date End Date Taking? Authorizing Provider  HYDROcodone-acetaminophen (NORCO/VICODIN) 5-325 MG tablet Take 1 tablet by mouth every 6 (six) hours as needed. 08/12/23  Yes Sheyli Horwitz, Edsel Petrin, PA-C  ondansetron (ZOFRAN-ODT) 4 MG disintegrating tablet Take 1 tablet (4 mg total) by mouth every 8 (eight) hours as needed for nausea or vomiting. 08/12/23  Yes Merikay Lesniewski, Edsel Petrin, PA-C  buPROPion (WELLBUTRIN XL) 150 MG 24 hr tablet Take 450 mg by mouth daily.    [provider]  dicyclomine (BENTYL) 10 MG capsule Take 1 capsule (10 mg total) by mouth 4 (four) times daily -  before meals and at bedtime. 10/07/22   Meryl Dare, MD  diltiazem (CARDIZEM CD) 240 MG 24 hr capsule TAKE 1 CAPSULE BY MOUTH EVERY DAY Patient taking differently: Take 240 mg by mouth daily. 02/19/21    Patwardhan, Anabel Bene, MD  esomeprazole (NEXIUM) 20 MG capsule Take 40 mg by mouth daily at 12 noon.    [provider]  fluconazole (DIFLUCAN) 150 MG tablet Take 150 mg by mouth once a week.    [provider]  losartan (COZAAR) 25 MG tablet Take 2 tablets (50 mg total) by mouth daily. 09/27/22 01/19/23  Briant Cedar, MD  mesalamine (LIALDA) 1.2 g EC tablet Take 2 tablets (2.4 g total) by mouth daily with breakfast. 01/20/23 02/19/23  Meryl Dare, MD  metroNIDAZOLE (FLAGYL) 500 MG tablet Take 1 tablet (500 mg total) by mouth 2 (two) times daily. 12/21/22   Karie Mainland, Amjad, PA-C  polyethylene glycol (MIRALAX / GLYCOLAX) 17 g packet Take 17 g by mouth daily as needed. 09/27/22   Briant Cedar, MD  rivaroxaban (XARELTO) 20 MG TABS tablet Take 1 tablet (20 mg total) by mouth daily with supper. 10/04/22   Rondel Baton, MD  rosuvastatin (CRESTOR) 40 MG tablet Take 1 tablet (40 mg total) by mouth once a week. Patient taking differently: Take 40 mg by mouth every Friday. 12/27/17   Ofilia Neas, PA-C  SUMAtriptan (IMITREX) 50 MG tablet Take 1 tablet (50 mg total) by mouth once as needed for migraine. May repeat in 2 hours if headache persists or recurs. 07/19/23 07/19/24  Elson Areas, PA-C  tamsulosin (FLOMAX) 0.4 MG CAPS capsule Take 1 capsule (0.4 mg total) by mouth daily. 04/05/18  Barnett Abu, [place] D, PA-C  tiZANidine (ZANAFLEX) 4 MG tablet Take 4 mg by mouth as directed.    [provider]  valACYclovir (VALTREX) 500 MG tablet Take 1 tablet (500 mg total) by mouth daily. 12/30/17   Ofilia Neas, PA-C  lisinopril-hydrochlorothiazide (PRINZIDE,ZESTORETIC) 20-25 MG per tablet Take 1 tablet by mouth daily. 07/24/11 08/21/11  Gordy Savers, MD      Allergies    Bismuth subsalicylate and Compazine [prochlorperazine edisylate]    Review of Systems   Review of Systems  Gastrointestinal:  Positive for abdominal pain, diarrhea, nausea and vomiting.  All  other systems reviewed and are negative.   Physical Exam Updated Vital Signs BP (!) 153/115   Pulse 98   Temp 98 F (36.7 C) (Oral)   Resp 16   Ht 5\' 9"  (1.753 m)   Wt 86.6 kg   SpO2 98%   BMI 28.19 kg/m  Physical Exam Vitals and nursing note reviewed.  Constitutional:      General: He is not in acute distress.    Appearance: Normal appearance. He is normal weight. He is not ill-appearing.  HENT:     Head: Normocephalic and atraumatic.  Cardiovascular:     Rate and Rhythm: Regular rhythm. Tachycardia present.  Pulmonary:     Effort: Pulmonary effort is normal. No respiratory distress.     Breath sounds: No wheezing, rhonchi or rales.  Abdominal:     General: Abdomen is flat.     Tenderness: There is abdominal tenderness in the right lower quadrant and left lower quadrant. There is guarding.  Musculoskeletal:        General: Normal range of motion.     Cervical back: Neck supple.  Skin:    General: Skin is warm and dry.  Neurological:     Mental Status: He is alert and oriented to person, place, and time.  Psychiatric:        Mood and Affect: Mood normal.        Behavior: Behavior normal.     ED Results / Procedures / Treatments   Labs (all labs ordered are listed, but only abnormal results are displayed) Labs Reviewed  CBC WITH DIFFERENTIAL/PLATELET - Abnormal; Notable for the following components:      Result Value   Neutro Abs 9.3 (*)    Lymphs Abs 0.3 (*)    All other components within normal limits  COMPREHENSIVE METABOLIC PANEL - Abnormal; Notable for the following components:   CO2 21 (*)    Glucose, Bld 118 (*)    BUN 21 (*)    All other components within normal limits  LIPASE, BLOOD - Abnormal; Notable for the following components:   Lipase <10 (*)    All other components within normal limits  URINALYSIS, ROUTINE W REFLEX MICROSCOPIC - Abnormal; Notable for the following components:   Hgb urine dipstick SMALL (*)    All other components within  normal limits  RESP PANEL BY RT-PCR (RSV, FLU A&B, COVID)  RVPGX2  MAGNESIUM    EKG EKG Interpretation Date/Time:  Thursday August 12 2023 12:31:31 EDT Ventricular Rate:  115 PR Interval:  139 QRS Duration:  101 QT Interval:  304 QTC Calculation: 421 R Axis:   99  Text Interpretation: Sinus tachycardia Borderline right axis deviation Borderline repolarization abnormality No significant change since last tracing Confirmed by Melene Plan [phone]) on 08/12/2023 12:46:14 PM  Radiology CT ABDOMEN PELVIS W CONTRAST Result Date: 08/12/2023 CLINICAL DATA:  Left lower quadrant  abdominal pain EXAM: CT ABDOMEN AND PELVIS WITH CONTRAST TECHNIQUE: Multidetector CT imaging of the abdomen and pelvis was performed using the standard protocol following bolus administration of intravenous contrast. RADIATION DOSE REDUCTION: This exam was performed according to the departmental dose-optimization program which includes automated exposure control, adjustment of the mA and/or kV according to patient size and/or use of iterative reconstruction technique. CONTRAST:  OMNIPAQUE IOHEXOL 300 MG/ML  SOLN COMPARISON:  CT abdomen and pelvis 12/21/2022 FINDINGS: Lower chest: No acute abnormality. Hepatobiliary: No focal liver abnormality is seen. No gallstones, gallbladder wall thickening, or biliary dilatation. Pancreas: Unremarkable. No pancreatic ductal dilatation or surrounding inflammatory changes. Spleen: Normal in size without focal abnormality. Adrenals/Urinary Tract: There is a subcentimeter cyst in the left kidney. Otherwise, the kidneys, adrenal glands and bladder are within normal limits. Stomach/Bowel: The stomach is dilated with air-fluid level. Small bowel and colon are nondilated. The appendix is within normal limits. There is sigmoid colon diverticulosis. There some mild wall thickening of the sigmoid colon versus normal under distension. There is no surrounding inflammation. Vascular/Lymphatic: No significant  vascular findings are present. No enlarged abdominal or pelvic lymph nodes. Reproductive: Prostate is unremarkable. Other: No abdominal wall hernia or abnormality. No abdominopelvic ascites. Musculoskeletal: No acute or significant osseous findings. IMPRESSION: 1. Sigmoid colon diverticulosis. There is some mild wall thickening of the sigmoid colon versus normal under distension. Correlate clinically for mild colitis. No surrounding inflammation. 2. Dilated stomach with air-fluid level. Findings may be related to gastroparesis or gastric outlet obstruction. Electronically Signed   By: Darliss Cheney M.D.   On: 08/12/2023 17:22    Procedures Procedures    Medications Ordered in ED Medications  acetaminophen (TYLENOL) tablet 1,000 mg (has no administration in time range)  potassium chloride SA (KLOR-CON M) CR tablet 40 mEq (has no administration in time range)  sodium chloride 0.9 % bolus 1,000 mL (1,000 mLs Intravenous New Bag/Given 08/12/23 1242)  HYDROmorphone (DILAUDID) injection 0.5 mg (0.5 mg Intravenous Given 08/12/23 1241)  ondansetron (ZOFRAN) injection 4 mg (4 mg Intravenous Given 08/12/23 1239)  sodium chloride 0.9 % bolus 1,000 mL (1,000 mLs Intravenous New Bag/Given 08/12/23 1341)  iohexol (OMNIPAQUE) 300 MG/ML solution 100 mL (100 mLs Intravenous Contrast Given 08/12/23 1421)  HYDROmorphone (DILAUDID) injection 0.5 mg (0.5 mg Intravenous Given 08/12/23 1444)    ED Course/ Medical Decision Making/ A&P                                 Medical Decision Making Amount and/or Complexity of Data Reviewed Labs: ordered. Radiology: ordered.  Risk Prescription drug management.  This patient presents to the ED for concern of diarrhea, this involves an extensive number of treatment options, and is a complaint that carries with it a high risk of complications and morbidity. The differential diagnosis of diarrhea includes but is not limited to Viral- norovirus/rotavirus;  Bacterial-Campylobacter,Shigella, Salmonella, Escherichia coli, E. coli 0157:H7, Yersinia enterocolitica, Vibrio cholerae, Clostridium difficile. Parasitic- Giardia lamblia, Cryptosporidium,Entamoeba histolytica,Cyclospora, Microsporidium. Toxin- Staphylococcus aureus, Bacillus cereus. Noninfectious causes include GI Bleed, Appendicitis, Mesenteric Ischemia, Diverticulitis, Adrenal Crisis, Thyroid Storm, Toxicologic exposures, Antibiotic or drug-associated, inflammatory bowel disease.   My initial workup includes labs, imaging, EKG, symptom control  Additional history obtained from: Nursing notes from this visit.  I ordered, reviewed and interpreted labs which include: CBC, CMP, lipase, magnesium, urinalysis, respiratory panel.  No leukocytosis or anemia.  No electrolyte derangement or kidney dysfunction, however  potassium is borderline at 3.5.  Normal magnesium.  Lipase less than 10.  Urine without evidence of infection but is fairly concentrated.  Respiratory panel negative.  I ordered imaging studies including CT abdomen pelvis I independently visualized and interpreted imaging which showed mild thickening of the sigmoid colon.  A dilated stomach with air-fluid level, potential gastroparesis or gastric outlet obstruction I agree with the radiologist interpretation  Cardiac Monitoring:  The patient was maintained on a cardiac monitor.  I personally viewed and interpreted the cardiac monitored which showed an underlying rhythm of: Sinus tachycardia  Afebrile, hemodynamically stable.  53 year old male presenting to the ED for evaluation of nausea, vomiting and diarrhea.  His daughter developed similar symptoms just prior to him.  He denies any fevers.  No hematemesis.  No hematochezia or melena.  He was initially significantly tachycardic in the emergency department.  He was given IV fluids and this is significantly improved.  He was requesting IV Dilaudid for abdominal pain.  Lab workup was  overall reassuring.  No evidence of significant dehydration.  Borderline hypokalemia was treated in the emergency department.  He did not have any episodes of emesis in his 7-hour ED visit.  He did have 2 episodes of diarrhea.  CT abdomen pelvis concerning for gastroparesis versus gastric outlet obstruction.  I had a shared decision-making conversation with the patient regarding continued management.  He was able to tolerate oral intake without difficulty.  He prefers discharge home.  He was given contact information for GI and encouraged to follow-up.  Suspect this to be a viral illness given his recent contact with similar symptoms.  He is also requesting hydrocodone for headaches stating that this has worked for him in the past and he cannot take NSAIDs due to factor V Leiden disease.  Short course was sent and he was educated on potential side effects.  He was given return precautions.  Stable at discharge.  At this time there does not appear to be any evidence of an acute emergency medical condition and the patient appears stable for discharge with appropriate outpatient follow up. Diagnosis was discussed with patient who verbalizes understanding of care plan and is agreeable to discharge. I have discussed return precautions with patient who verbalizes understanding. Patient encouraged to follow-up with their PCP within 1 week. All questions answered.  Note: Portions of this report may have been transcribed using voice recognition software. Every effort was made to ensure accuracy; however, inadvertent computerized transcription errors may still be present.        Final Clinical Impression(s) / ED Diagnoses Final diagnoses:  Nausea, vomiting and diarrhea    Rx / DC Orders ED Discharge Orders          Ordered    ondansetron (ZOFRAN-ODT) 4 MG disintegrating tablet  Every 8 hours PRN        08/12/23 1815    HYDROcodone-acetaminophen (NORCO/VICODIN) 5-325 MG tablet  Every 6 hours PRN         08/12/23 1815              Mora Bellman 08/12/23 1815    Tegeler, Canary Brim, MD 08/12/23 2321

## 2023-08-12 NOTE — Discharge Instructions (Addendum)
 You have been seen today for your complaint of nausea, vomiting, diarrhea, abdominal pain. Your lab work was overall reassuring. Your imaging was overall reassuring but does show a distended stomach. Your discharge medications include.  This is a nausea medicine.  Take this as needed in order to eat and drink normal diet. Home care instructions are as follows:  Drink a clear liquid diet and plenty of water until your symptoms resolve Follow up with: Dr. Elnoria Howard.  He is a GI doctor.  Call to schedule follow-up appointment regarding your distended stomach on CT. Please seek immediate medical care if you develop any of the following symptoms: Have chest pain. Have trouble breathing or you are breathing very quickly. Have a fast heartbeat. Feel extremely weak or you faint. Have a severe headache, a stiff neck, or both. Have a rash. Have severe pain, cramping, or bloating in your abdomen. Have skin that feels cold and clammy. Feel confused. Have pain when you urinate. Have signs of dehydration, such as: Dark urine, very little urine, or no urine. Cracked lips. Dry mouth. Sunken eyes. Sleepiness. Weakness. Have signs of bleeding, such as: Seeing blood in your vomit. Having vomit that looks like coffee grounds. Having bloody or black stools or stools that look like tar. At this time there does not appear to be the presence of an emergent medical condition, however there is always the potential for conditions to change. Please read and follow the below instructions.  Do not take your medicine if  develop an itchy rash, swelling in your mouth or lips, or difficulty breathing; call 911 and seek immediate emergency medical attention if this occurs.  You may review your lab tests and imaging results in their entirety on your MyChart account.  Please discuss all results of fully with your primary care provider and other specialist at your follow-up visit.  Note: Portions of this text may have been  transcribed using voice recognition software. Every effort was made to ensure accuracy; however, inadvertent computerized transcription errors may still be present.

## 2023-09-20 ENCOUNTER — Other Ambulatory Visit: Payer: Self-pay

## 2023-09-20 ENCOUNTER — Emergency Department (HOSPITAL_BASED_OUTPATIENT_CLINIC_OR_DEPARTMENT_OTHER)

## 2023-09-20 ENCOUNTER — Encounter (HOSPITAL_BASED_OUTPATIENT_CLINIC_OR_DEPARTMENT_OTHER): Payer: Self-pay

## 2023-09-20 ENCOUNTER — Inpatient Hospital Stay (HOSPITAL_BASED_OUTPATIENT_CLINIC_OR_DEPARTMENT_OTHER)
Admission: EM | Admit: 2023-09-20 | Discharge: 2023-09-25 | DRG: 391 | Disposition: A | Discharge status: Home / Self Care | Attending: Family Medicine | Admitting: Family Medicine

## 2023-09-20 DIAGNOSIS — K219 Gastro-esophageal reflux disease without esophagitis: Secondary | ICD-10-CM | POA: Diagnosis present

## 2023-09-20 DIAGNOSIS — Z6828 Body mass index (BMI) 28.0-28.9, adult: Secondary | ICD-10-CM

## 2023-09-20 DIAGNOSIS — G4733 Obstructive sleep apnea (adult) (pediatric): Secondary | ICD-10-CM | POA: Diagnosis present

## 2023-09-20 DIAGNOSIS — Z8042 Family history of malignant neoplasm of prostate: Secondary | ICD-10-CM

## 2023-09-20 DIAGNOSIS — B356 Tinea cruris: Secondary | ICD-10-CM | POA: Diagnosis present

## 2023-09-20 DIAGNOSIS — Z860101 Personal history of adenomatous and serrated colon polyps: Secondary | ICD-10-CM

## 2023-09-20 DIAGNOSIS — E663 Overweight: Secondary | ICD-10-CM | POA: Diagnosis present

## 2023-09-20 DIAGNOSIS — Z7901 Long term (current) use of anticoagulants: Secondary | ICD-10-CM

## 2023-09-20 DIAGNOSIS — K651 Peritoneal abscess: Secondary | ICD-10-CM | POA: Diagnosis present

## 2023-09-20 DIAGNOSIS — N390 Urinary tract infection, site not specified: Secondary | ICD-10-CM | POA: Insufficient documentation

## 2023-09-20 DIAGNOSIS — E785 Hyperlipidemia, unspecified: Secondary | ICD-10-CM | POA: Diagnosis present

## 2023-09-20 DIAGNOSIS — K5720 Diverticulitis of large intestine with perforation and abscess without bleeding: Principal | ICD-10-CM | POA: Diagnosis present

## 2023-09-20 DIAGNOSIS — I129 Hypertensive chronic kidney disease with stage 1 through stage 4 chronic kidney disease, or unspecified chronic kidney disease: Secondary | ICD-10-CM | POA: Diagnosis present

## 2023-09-20 DIAGNOSIS — K760 Fatty (change of) liver, not elsewhere classified: Secondary | ICD-10-CM | POA: Diagnosis present

## 2023-09-20 DIAGNOSIS — F32A Depression, unspecified: Secondary | ICD-10-CM | POA: Diagnosis present

## 2023-09-20 DIAGNOSIS — Z79899 Other long term (current) drug therapy: Secondary | ICD-10-CM

## 2023-09-20 DIAGNOSIS — K2270 Barrett's esophagus without dysplasia: Secondary | ICD-10-CM | POA: Diagnosis present

## 2023-09-20 DIAGNOSIS — Z83438 Family history of other disorder of lipoprotein metabolism and other lipidemia: Secondary | ICD-10-CM

## 2023-09-20 DIAGNOSIS — F419 Anxiety disorder, unspecified: Secondary | ICD-10-CM | POA: Diagnosis present

## 2023-09-20 DIAGNOSIS — Z8249 Family history of ischemic heart disease and other diseases of the circulatory system: Secondary | ICD-10-CM

## 2023-09-20 DIAGNOSIS — Z801 Family history of malignant neoplasm of trachea, bronchus and lung: Secondary | ICD-10-CM

## 2023-09-20 DIAGNOSIS — N200 Calculus of kidney: Secondary | ICD-10-CM | POA: Diagnosis present

## 2023-09-20 DIAGNOSIS — R3911 Hesitancy of micturition: Secondary | ICD-10-CM | POA: Diagnosis present

## 2023-09-20 DIAGNOSIS — G43909 Migraine, unspecified, not intractable, without status migrainosus: Secondary | ICD-10-CM | POA: Diagnosis present

## 2023-09-20 DIAGNOSIS — E876 Hypokalemia: Secondary | ICD-10-CM | POA: Diagnosis present

## 2023-09-20 DIAGNOSIS — N182 Chronic kidney disease, stage 2 (mild): Secondary | ICD-10-CM | POA: Diagnosis present

## 2023-09-20 DIAGNOSIS — N400 Enlarged prostate without lower urinary tract symptoms: Secondary | ICD-10-CM | POA: Diagnosis present

## 2023-09-20 DIAGNOSIS — Z86718 Personal history of other venous thrombosis and embolism: Secondary | ICD-10-CM

## 2023-09-20 DIAGNOSIS — Z833 Family history of diabetes mellitus: Secondary | ICD-10-CM

## 2023-09-20 DIAGNOSIS — D6851 Activated protein C resistance: Secondary | ICD-10-CM | POA: Diagnosis present

## 2023-09-20 DIAGNOSIS — Z888 Allergy status to other drugs, medicaments and biological substances status: Secondary | ICD-10-CM

## 2023-09-20 DIAGNOSIS — Z800 Family history of malignant neoplasm of digestive organs: Secondary | ICD-10-CM

## 2023-09-20 LAB — COMPREHENSIVE METABOLIC PANEL WITH GFR
ALT: 17 U/L (ref 0–44)
AST: 16 U/L (ref 15–41)
Albumin: 5 g/dL (ref 3.5–5.0)
Alkaline Phosphatase: 73 U/L (ref 38–126)
Anion gap: 10 (ref 5–15)
BUN: 17 mg/dL (ref 6–20)
CO2: 24 mmol/L (ref 22–32)
Calcium: 9.9 mg/dL (ref 8.9–10.3)
Chloride: 101 mmol/L (ref 98–111)
Creatinine, Ser: 1.1 mg/dL (ref 0.61–1.24)
GFR, Estimated: >60 mL/min (ref >60)
Glucose, Bld: 101 mg/dL — ABNORMAL HIGH (ref 70–99)
Potassium: 3.7 mmol/L (ref 3.5–5.1)
Sodium: 135 mmol/L (ref 135–145)
Total Bilirubin: 1.1 mg/dL (ref 0.0–1.2)
Total Protein: 7.9 g/dL (ref 6.5–8.1)

## 2023-09-20 LAB — CBC
HCT: 45.6 % (ref 39.0–52.0)
Hemoglobin: 16.3 g/dL (ref 13.0–17.0)
MCH: 30.8 pg (ref 26.0–34.0)
MCHC: 35.7 g/dL (ref 30.0–36.0)
MCV: 86.2 fL (ref 80.0–100.0)
Platelets: 344 10*3/uL (ref 150–400)
RBC: 5.29 MIL/uL (ref 4.22–5.81)
RDW: 12.2 % (ref 11.5–15.5)
WBC: 15.4 10*3/uL — ABNORMAL HIGH (ref 4.0–10.5)
nRBC: 0 % (ref 0.0–0.2)

## 2023-09-20 LAB — URINALYSIS, ROUTINE W REFLEX MICROSCOPIC
Bilirubin Urine: NEGATIVE
Glucose, UA: NEGATIVE mg/dL
Ketones, ur: NEGATIVE mg/dL
Nitrite: NEGATIVE
Specific Gravity, Urine: 1.026 (ref 1.005–1.030)
pH: 5.5 (ref 5.0–8.0)

## 2023-09-20 LAB — LIPASE, BLOOD: Lipase: <10 U/L — ABNORMAL LOW (ref 11–51)

## 2023-09-20 MED ORDER — IOHEXOL 300 MG/ML  SOLN
100.0000 mL | Freq: Once | INTRAMUSCULAR | Status: AC | PRN
  Administered 2023-09-20: 100 mL via INTRAVENOUS

## 2023-09-20 MED ORDER — MORPHINE SULFATE (PF) 4 MG/ML IV SOLN
4.0000 mg | Freq: Once | INTRAVENOUS | Status: AC
  Administered 2023-09-20: 4 mg via INTRAVENOUS
  Filled 2023-09-20: qty 1

## 2023-09-20 MED ORDER — ONDANSETRON HCL 4 MG/2ML IJ SOLN
4.0000 mg | Freq: Once | INTRAMUSCULAR | Status: AC
  Administered 2023-09-20: 4 mg via INTRAVENOUS
  Filled 2023-09-20: qty 2

## 2023-09-20 NOTE — ED Provider Notes (Signed)
 [name] Provider Note   CSN: [id number] Arrival date & time: 09/20/23  1745     History {Add pertinent medical, surgical, social history, OB history to HPI:1} Chief Complaint  Patient presents with   Abdominal Pain    Benjamin [name] is a 53 y.o. male.  LLQ pain. Vomited one time this afternoon at 1600 Last bm and normal One dose of augmentin  today  Abdominal Pain      Home Medications Prior to Admission medications   Medication Sig Start Date End Date Taking? Authorizing Provider  buPROPion  (WELLBUTRIN  XL) 150 MG 24 hr tablet Take 450 mg by mouth daily.    [provider]  dicyclomine  (BENTYL ) 10 MG capsule Take 1 capsule (10 mg total) by mouth 4 (four) times daily -  before meals and at bedtime. 10/07/22   Asencion Blacksmith, MD  diltiazem  (CARDIZEM  CD) 240 MG 24 hr capsule TAKE 1 CAPSULE BY MOUTH EVERY DAY Patient taking differently: Take 240 mg by mouth daily. 02/19/21   Patwardhan, Kaye Parsons, MD  esomeprazole  (NEXIUM ) 20 MG capsule Take 40 mg by mouth daily at 12 noon.    [provider]  fluconazole  (DIFLUCAN ) 150 MG tablet Take 150 mg by mouth once a week.    [provider]  HYDROcodone -acetaminophen  (NORCO/VICODIN) 5-325 MG tablet Take 1 tablet by mouth every 6 (six) hours as needed. 08/12/23   Schutt, Coni Deep, PA-C  losartan  (COZAAR ) 25 MG tablet Take 2 tablets (50 mg total) by mouth daily. 09/27/22 01/19/23  Veronica Gordon, MD  mesalamine  (LIALDA ) 1.2 g EC tablet Take 2 tablets (2.4 g total) by mouth daily with breakfast. 01/20/23 02/19/23  Asencion Blacksmith, MD  metroNIDAZOLE  (FLAGYL ) 500 MG tablet Take 1 tablet (500 mg total) by mouth 2 (two) times daily. 12/21/22   Lucina Sabal, PA-C  ondansetron  (ZOFRAN -ODT) 4 MG disintegrating tablet Take 1 tablet (4 mg total) by mouth every 8 (eight) hours as needed for nausea or vomiting. 08/12/23   Schutt, Coni Deep, PA-C  polyethylene glycol (MIRALAX  /  GLYCOLAX ) 17 g packet Take 17 g by mouth daily as needed. 09/27/22   Ezenduka, Nkeiruka J, MD  rivaroxaban  (XARELTO ) 20 MG TABS tablet Take 1 tablet (20 mg total) by mouth daily with supper. 10/04/22   Ninetta Basket, MD  rosuvastatin  (CRESTOR ) 40 MG tablet Take 1 tablet (40 mg total) by mouth once a week. Patient taking differently: Take 40 mg by mouth every Friday. 12/27/17   Bettie Bruce, PA-C  SUMAtriptan  (IMITREX ) 50 MG tablet Take 1 tablet (50 mg total) by mouth once as needed for migraine. May repeat in 2 hours if headache persists or recurs. 07/19/23 07/19/24  Sandi Crosby, PA-C  tamsulosin  (FLOMAX ) 0.4 MG CAPS capsule Take 1 capsule (0.4 mg total) by mouth daily. 04/05/18   Delbra Feeler D, PA-C  tiZANidine  (ZANAFLEX ) 4 MG tablet Take 4 mg by mouth as directed.    [provider]  valACYclovir  (VALTREX ) 500 MG tablet Take 1 tablet (500 mg total) by mouth daily. 12/30/17   Bettie Bruce, PA-C  lisinopril -hydrochlorothiazide  (PRINZIDE ,ZESTORETIC ) 20-25 MG per tablet Take 1 tablet by mouth daily. 07/24/11 08/21/11  Hillery Lown, MD      Allergies    Bismuth subsalicylate and Compazine  [prochlorperazine  edisylate]    Review of Systems   Review of Systems  Gastrointestinal:  Positive for abdominal pain.    Physical Exam Updated Vital Signs BP Aaron Aas)  150/105   Pulse 95   Temp 98 F (36.7 C) (Oral)   Resp 16   Ht 5\' 9"  (1.753 m)   Wt 86.2 kg   SpO2 100%   BMI 28.06 kg/m  Physical Exam  ED Results / Procedures / Treatments   Labs (all labs ordered are listed, but only abnormal results are displayed) Labs Reviewed  LIPASE, BLOOD - Abnormal; Notable for the following components:      Result Value   Lipase <10 (*)    All other components within normal limits  COMPREHENSIVE METABOLIC PANEL WITH GFR - Abnormal; Notable for the following components:   Glucose, Bld 101 (*)    All other components within normal limits  CBC - Abnormal; Notable for the  following components:   WBC 15.4 (*)    All other components within normal limits  URINALYSIS, ROUTINE W REFLEX MICROSCOPIC    EKG None  Radiology No results found.  Procedures Procedures  {Document cardiac monitor, telemetry assessment procedure when appropriate:1}  Medications Ordered in ED Medications - No data to display  ED Course/ Medical Decision Making/ A&P   {   Click here for ABCD2, HEART and other calculatorsREFRESH Note before signing :1}                              Medical Decision Making Amount and/or Complexity of Data Reviewed Labs: ordered.   ***  {Document critical care time when appropriate:1} {Document review of labs and clinical decision tools ie heart score, Chads2Vasc2 etc:1}  {Document your independent review of radiology images, and any outside records:1} {Document your discussion with family members, caretakers, and with consultants:1} {Document social determinants of health affecting pt's care:1} {Document your decision making why or why not admission, treatments were needed:1} Final Clinical Impression(s) / ED Diagnoses Final diagnoses:  None    Rx / DC Orders ED Discharge Orders     None

## 2023-09-20 NOTE — ED Triage Notes (Signed)
 In for eval of possible diverticulitis. LLQ abd pain today at approx 1100. Vomited 30 minutes ago. Pain has moved into his groin. Reports dark colored or brown urine. Took a hydrocodone  at approx 1200 with some relief but pain has returned.

## 2023-09-21 DIAGNOSIS — I129 Hypertensive chronic kidney disease with stage 1 through stage 4 chronic kidney disease, or unspecified chronic kidney disease: Secondary | ICD-10-CM | POA: Diagnosis present

## 2023-09-21 DIAGNOSIS — N400 Enlarged prostate without lower urinary tract symptoms: Secondary | ICD-10-CM | POA: Diagnosis present

## 2023-09-21 DIAGNOSIS — E663 Overweight: Secondary | ICD-10-CM | POA: Diagnosis present

## 2023-09-21 DIAGNOSIS — G43909 Migraine, unspecified, not intractable, without status migrainosus: Secondary | ICD-10-CM | POA: Diagnosis present

## 2023-09-21 DIAGNOSIS — K5720 Diverticulitis of large intestine with perforation and abscess without bleeding: Secondary | ICD-10-CM | POA: Diagnosis present

## 2023-09-21 DIAGNOSIS — N200 Calculus of kidney: Secondary | ICD-10-CM | POA: Diagnosis present

## 2023-09-21 DIAGNOSIS — N390 Urinary tract infection, site not specified: Secondary | ICD-10-CM | POA: Insufficient documentation

## 2023-09-21 DIAGNOSIS — K760 Fatty (change of) liver, not elsewhere classified: Secondary | ICD-10-CM | POA: Diagnosis present

## 2023-09-21 DIAGNOSIS — K651 Peritoneal abscess: Secondary | ICD-10-CM | POA: Diagnosis present

## 2023-09-21 DIAGNOSIS — E785 Hyperlipidemia, unspecified: Secondary | ICD-10-CM | POA: Diagnosis present

## 2023-09-21 DIAGNOSIS — K5732 Diverticulitis of large intestine without perforation or abscess without bleeding: Secondary | ICD-10-CM | POA: Diagnosis not present

## 2023-09-21 DIAGNOSIS — Z888 Allergy status to other drugs, medicaments and biological substances status: Secondary | ICD-10-CM | POA: Diagnosis not present

## 2023-09-21 DIAGNOSIS — N182 Chronic kidney disease, stage 2 (mild): Secondary | ICD-10-CM | POA: Diagnosis present

## 2023-09-21 DIAGNOSIS — K219 Gastro-esophageal reflux disease without esophagitis: Secondary | ICD-10-CM | POA: Diagnosis present

## 2023-09-21 DIAGNOSIS — B356 Tinea cruris: Secondary | ICD-10-CM | POA: Diagnosis present

## 2023-09-21 DIAGNOSIS — Z6828 Body mass index (BMI) 28.0-28.9, adult: Secondary | ICD-10-CM | POA: Diagnosis not present

## 2023-09-21 DIAGNOSIS — D6851 Activated protein C resistance: Secondary | ICD-10-CM | POA: Diagnosis present

## 2023-09-21 DIAGNOSIS — D72829 Elevated white blood cell count, unspecified: Secondary | ICD-10-CM

## 2023-09-21 DIAGNOSIS — K2270 Barrett's esophagus without dysplasia: Secondary | ICD-10-CM | POA: Diagnosis present

## 2023-09-21 DIAGNOSIS — Z860101 Personal history of adenomatous and serrated colon polyps: Secondary | ICD-10-CM

## 2023-09-21 DIAGNOSIS — G4733 Obstructive sleep apnea (adult) (pediatric): Secondary | ICD-10-CM | POA: Diagnosis present

## 2023-09-21 DIAGNOSIS — Z79899 Other long term (current) drug therapy: Secondary | ICD-10-CM | POA: Diagnosis not present

## 2023-09-21 DIAGNOSIS — Z8719 Personal history of other diseases of the digestive system: Secondary | ICD-10-CM | POA: Diagnosis not present

## 2023-09-21 DIAGNOSIS — F32A Depression, unspecified: Secondary | ICD-10-CM | POA: Diagnosis present

## 2023-09-21 DIAGNOSIS — Z86718 Personal history of other venous thrombosis and embolism: Secondary | ICD-10-CM | POA: Diagnosis not present

## 2023-09-21 DIAGNOSIS — Z7901 Long term (current) use of anticoagulants: Secondary | ICD-10-CM | POA: Diagnosis not present

## 2023-09-21 DIAGNOSIS — E876 Hypokalemia: Secondary | ICD-10-CM | POA: Diagnosis present

## 2023-09-21 LAB — COMPREHENSIVE METABOLIC PANEL WITH GFR
ALT: 17 U/L (ref 0–44)
AST: 15 U/L (ref 15–41)
Albumin: 3.8 g/dL (ref 3.5–5.0)
Alkaline Phosphatase: 54 U/L (ref 38–126)
Anion gap: 11 (ref 5–15)
BUN: 11 mg/dL (ref 6–20)
CO2: 24 mmol/L (ref 22–32)
Calcium: 9 mg/dL (ref 8.9–10.3)
Chloride: 100 mmol/L (ref 98–111)
Creatinine, Ser: 1.19 mg/dL (ref 0.61–1.24)
GFR, Estimated: >60 mL/min (ref >60)
Glucose, Bld: 105 mg/dL — ABNORMAL HIGH (ref 70–99)
Potassium: 3.5 mmol/L (ref 3.5–5.1)
Sodium: 135 mmol/L (ref 135–145)
Total Bilirubin: 1.9 mg/dL — ABNORMAL HIGH (ref 0.0–1.2)
Total Protein: 6.8 g/dL (ref 6.5–8.1)

## 2023-09-21 LAB — CBC WITH DIFFERENTIAL/PLATELET
Abs Immature Granulocytes: 0.04 10*3/uL (ref 0.00–0.07)
Basophils Absolute: 0.1 10*3/uL (ref 0.0–0.1)
Basophils Relative: 1 %
Eosinophils Absolute: 0.2 10*3/uL (ref 0.0–0.5)
Eosinophils Relative: 2 %
HCT: 43.3 % (ref 39.0–52.0)
Hemoglobin: 14.9 g/dL (ref 13.0–17.0)
Immature Granulocytes: 0 %
Lymphocytes Relative: 19 %
Lymphs Abs: 2.1 10*3/uL (ref 0.7–4.0)
MCH: 30.6 pg (ref 26.0–34.0)
MCHC: 34.4 g/dL (ref 30.0–36.0)
MCV: 88.9 fL (ref 80.0–100.0)
Monocytes Absolute: 1 10*3/uL (ref 0.1–1.0)
Monocytes Relative: 9 %
Neutro Abs: 7.9 10*3/uL — ABNORMAL HIGH (ref 1.7–7.7)
Neutrophils Relative %: 69 %
Platelets: 297 10*3/uL (ref 150–400)
RBC: 4.87 MIL/uL (ref 4.22–5.81)
RDW: 12.3 % (ref 11.5–15.5)
WBC: 11.4 10*3/uL — ABNORMAL HIGH (ref 4.0–10.5)
nRBC: 0 % (ref 0.0–0.2)

## 2023-09-21 LAB — MAGNESIUM: Magnesium: 1.8 mg/dL (ref 1.7–2.4)

## 2023-09-21 MED ORDER — PIPERACILLIN-TAZOBACTAM 3.375 G IVPB 30 MIN
3.3750 g | Freq: Once | INTRAVENOUS | Status: AC
  Administered 2023-09-21: 3.375 g via INTRAVENOUS
  Filled 2023-09-21: qty 50

## 2023-09-21 MED ORDER — VALACYCLOVIR HCL 500 MG PO TABS
500.0000 mg | ORAL_TABLET | Freq: Every day | ORAL | Status: DC
  Administered 2023-09-21 – 2023-09-25 (×5): 500 mg via ORAL
  Filled 2023-09-21 (×5): qty 1

## 2023-09-21 MED ORDER — CIPROFLOXACIN IN D5W 400 MG/200ML IV SOLN
400.0000 mg | Freq: Two times a day (BID) | INTRAVENOUS | Status: DC
  Administered 2023-09-21 – 2023-09-25 (×9): 400 mg via INTRAVENOUS
  Filled 2023-09-21 (×10): qty 200

## 2023-09-21 MED ORDER — MORPHINE SULFATE (PF) 4 MG/ML IV SOLN
4.0000 mg | Freq: Once | INTRAVENOUS | Status: AC
  Administered 2023-09-21: 4 mg via INTRAVENOUS
  Filled 2023-09-21: qty 1

## 2023-09-21 MED ORDER — LACTATED RINGERS IV BOLUS
1000.0000 mL | Freq: Once | INTRAVENOUS | Status: AC
  Administered 2023-09-21: 1000 mL via INTRAVENOUS

## 2023-09-21 MED ORDER — SODIUM CHLORIDE 0.9 % IV SOLN: INTRAVENOUS | Status: AC

## 2023-09-21 MED ORDER — HYDROMORPHONE HCL 1 MG/ML IJ SOLN
1.0000 mg | INTRAMUSCULAR | Status: DC | PRN
  Administered 2023-09-21 – 2023-09-24 (×9): 1 mg via INTRAVENOUS
  Filled 2023-09-21 (×11): qty 1

## 2023-09-21 MED ORDER — BUPROPION HCL ER (XL) 150 MG PO TB24
450.0000 mg | ORAL_TABLET | Freq: Every day | ORAL | Status: DC
Start: 2023-09-21 — End: 2023-09-25
  Administered 2023-09-21 – 2023-09-25 (×5): 450 mg via ORAL
  Filled 2023-09-21 (×5): qty 3

## 2023-09-21 MED ORDER — MORPHINE SULFATE (PF) 2 MG/ML IV SOLN
2.0000 mg | Freq: Once | INTRAVENOUS | Status: AC
  Administered 2023-09-21: 2 mg via INTRAVENOUS
  Filled 2023-09-21: qty 1

## 2023-09-21 MED ORDER — DILTIAZEM HCL ER COATED BEADS 120 MG PO CP24
240.0000 mg | ORAL_CAPSULE | Freq: Every day | ORAL | Status: DC
  Administered 2023-09-21 – 2023-09-25 (×5): 240 mg via ORAL
  Filled 2023-09-21 (×5): qty 2

## 2023-09-21 MED ORDER — ROSUVASTATIN CALCIUM 20 MG PO TABS: 40.0000 mg | ORAL_TABLET | ORAL | Status: DC

## 2023-09-21 MED ORDER — PIPERACILLIN-TAZOBACTAM 3.375 G IVPB: 3.3750 g | Freq: Three times a day (TID) | INTRAVENOUS | Status: DC

## 2023-09-21 MED ORDER — BUTALBITAL-APAP-CAFFEINE 50-325-40 MG PO TABS
1.0000 | ORAL_TABLET | ORAL | Status: DC | PRN
  Administered 2023-09-21 – 2023-09-24 (×7): 1 via ORAL
  Filled 2023-09-21 (×7): qty 1

## 2023-09-21 MED ORDER — SUMATRIPTAN SUCCINATE 50 MG PO TABS: 50.0000 mg | ORAL_TABLET | ORAL | Status: DC | PRN

## 2023-09-21 MED ORDER — ONDANSETRON HCL 4 MG/2ML IJ SOLN
4.0000 mg | Freq: Four times a day (QID) | INTRAMUSCULAR | Status: DC | PRN
  Administered 2023-09-21 – 2023-09-24 (×3): 4 mg via INTRAVENOUS
  Filled 2023-09-21 (×4): qty 2

## 2023-09-21 MED ORDER — ACETAMINOPHEN 325 MG PO TABS
650.0000 mg | ORAL_TABLET | Freq: Four times a day (QID) | ORAL | Status: DC | PRN
  Administered 2023-09-21: 650 mg via ORAL
  Filled 2023-09-21: qty 2

## 2023-09-21 MED ORDER — TAMSULOSIN HCL 0.4 MG PO CAPS
0.4000 mg | ORAL_CAPSULE | Freq: Every day | ORAL | Status: DC
Start: 2023-09-21 — End: 2023-09-25
  Administered 2023-09-21 – 2023-09-25 (×5): 0.4 mg via ORAL
  Filled 2023-09-21 (×5): qty 1

## 2023-09-21 MED ORDER — PANTOPRAZOLE SODIUM 40 MG PO TBEC
40.0000 mg | DELAYED_RELEASE_TABLET | Freq: Every day | ORAL | Status: DC
  Administered 2023-09-21 – 2023-09-25 (×5): 40 mg via ORAL
  Filled 2023-09-21 (×5): qty 1

## 2023-09-21 MED ORDER — METRONIDAZOLE 500 MG/100ML IV SOLN
500.0000 mg | Freq: Two times a day (BID) | INTRAVENOUS | Status: DC
  Administered 2023-09-21 – 2023-09-25 (×9): 500 mg via INTRAVENOUS
  Filled 2023-09-21 (×9): qty 100

## 2023-09-21 MED ORDER — RIVAROXABAN 20 MG PO TABS
20.0000 mg | ORAL_TABLET | Freq: Every day | ORAL | Status: DC
  Administered 2023-09-21: 20 mg via ORAL
  Filled 2023-09-21: qty 1

## 2023-09-21 MED ORDER — LOSARTAN POTASSIUM 50 MG PO TABS
25.0000 mg | ORAL_TABLET | Freq: Every day | ORAL | Status: DC
  Administered 2023-09-21 – 2023-09-25 (×5): 25 mg via ORAL
  Filled 2023-09-21 (×5): qty 1

## 2023-09-21 MED ORDER — ACETAMINOPHEN 650 MG RE SUPP: 650.0000 mg | Freq: Four times a day (QID) | RECTAL | Status: DC | PRN

## 2023-09-21 NOTE — ED Provider Notes (Signed)
 Care assumed at shift change. Patient with history of diverticulitis, here for LLQ abdominal pain. Labs show a leukocytosis, awaiting CT at shift change.  Physical Exam  BP (!) 147/108   Pulse 85   Temp 98.5 F (36.9 C) (Oral)   Resp 18   Ht 5\' 9"  (1.753 m)   Wt 86.2 kg   SpO2 100%   BMI 28.06 kg/m   Physical Exam  Procedures  Procedures  ED Course / MDM   Clinical Course as of 09/21/23 0623  Tue Sep 21, 2023  0424 I personally viewed the images from radiology studies and agree with radiologist interpretation: CT shows severe diverticulitis with possible early abscess formation. Discussed results with patient, will begin Abx, additional pain meds for comfort and page hospitalist for admission. [CS]  [phone] Spoke with Dr. Farrel Hones, Hospitalist, who will accept the patient for admission.  [CS]    Clinical Course User Index [CS] Charmayne Cooper, MD   Medical Decision Making Amount and/or Complexity of Data Reviewed Labs: ordered. Radiology: ordered.  Risk Prescription drug management. Decision regarding hospitalization.          Charmayne Cooper, MD 09/21/23 [phone]

## 2023-09-21 NOTE — H&P (Signed)
 History and Physical    Patient: Benjamin [name] ZOX:[id number] DOB: 1970-10-20 DOA: 09/20/2023 DOS: the patient was seen and examined on 09/21/2023 PCP: Bettie Bruce, PA-C  Patient coming from: Home  Chief Complaint:  Chief Complaint  Patient presents with   Abdominal Pain   HPI: Benjamin [name] is a 53 y.o. 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  He reports that yesterday evening he developed sharp left lower quadrant abdominal pain that is now radiating to the right side.  He denies subjective fever, nausea, vomiting, or diarrhea.  He reports that the pain feels similar to his prior episodes of diverticulitis.  Patient also endorsing urinary hesitancy and dysuria that began yesterday evening.  He denies burning with urination, hematuria, flank pain, testicular pain/swelling, penile discharge.  Per chart review he has had multiple prior hospital admissions for diverticulitis as well as outpatient treatment for the same and [facility]. Last endoluminal evaluation was in August 2024 which showed a 5 mm polyp at the transverse colon and medium to small mouth diverticula in the left colon as well as small hiatal hernia and patchy inflammation with erythema and granularity of the gastric fundus and gastric body.  ED Course: On arrival to [facility] ED patient was noted to be afebrile temp 36.6 C, BP 136/104, HR 94,, SpO2 100% on room air.  At that time he was endorsing 7 out of 10 acute left lower quadrant abdominal cramping.  CT abdomen pelvis obtained and shows sigmoid diverticulitis with a 2.6 cm x 2.1 cm x 2.1 cm area of low-attenuation that is consistent with an early abscess and small nonobstructing left renal calculi also seen.  Labs notable for lipase less than 10, leukocytosis WBC  15.4, UA with large leukocytes and few bacteria consistent with UTI.  He was given 1L LR bolus, morphine , Zofran , and started on Zosyn .  [facility] contacted for admission.  Review of Systems: As mentioned in the history of present illness. All other systems reviewed and are negative. Past Medical History:  Diagnosis Date   Acute DVT of left tibial vein (HCC) 04/24/2017   Xarelto  started (dx'd in emergency dept).  Dr. Maria Shiner recommended full dose xarelto  x 22mo, with an additional year of maintenance dose xarelto  (as of 05/2017 initial consult note).  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  Pt Factor V leiden heterozygote.  Stable as of 09/2017 hem f/u.   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  Pt referred for sleep study.   Chronic renal insufficiency, stage II (mild)    GFR 60s-70s (suspect HTN and NSAID damage as etiology).  GFR dipped into 50s fall 2018 after lots of NSAID use for chronic daily HA's.   Diverticulitis 2016   CT 01/2015  Frequent headaches    Gastric polyp 2006   GERD without esophagitis    Herpes labialis    Hiatal hernia    Hyperlipidemia    HYPERTENSION 01/05/2007   OSA (obstructive sleep apnea) 04/2017   Dr. Omar Bibber eval 04/2017-plan for sleep study.   PSORIASIS 01/05/2007   Past Surgical History:  Procedure Laterality Date   COLONOSCOPY W/ POLYPECTOMY  10/04/15   7mm tubular  adenoma + diverticulosis: recall 5 yrs (Dr. Sandrea Cruel)   ESOPHAGOGASTRODUODENOSCOPY  04/12/14   Barrett's esophagus   LIPOMA EXCISION     several, arm   Social History:  reports that he has never smoked. He has never used smokeless tobacco. He reports current alcohol  use of about 2.0 standard drinks of alcohol  per week. He reports that he does not use drugs.  Allergies  Allergen Reactions   Bismuth Subsalicylate Swelling and Other (See Comments)   Compazine  [Prochlorperazine  Edisylate] Other (See Comments)    Cervical dystonia and panic attack    Family History  Problem Relation Age of Onset   Hypertension Mother    CAD Father        around age 27   Heart disease Father    Hyperlipidemia Father    Hypertension Father    Hypertension Brother    Hyperlipidemia Brother    Lung cancer Maternal Grandmother    Colon cancer Maternal Grandfather    Cancer Maternal Grandfather        type unknown   CAD Paternal Grandmother    Diabetes Paternal Grandmother    Colon cancer Other        neg hx   Prostate cancer Other        neg hx   Esophageal cancer Neg Hx    Stomach cancer Neg Hx     Prior to Admission medications   Medication Sig Start Date End Date Taking? Authorizing Provider  buPROPion  (WELLBUTRIN  XL) 150 MG 24 hr tablet Take 450 mg by mouth daily.    [provider]  dicyclomine  (BENTYL ) 10 MG capsule Take 1 capsule (10 mg total) by mouth 4 (four) times daily -  before meals and at bedtime. 10/07/22   Asencion Blacksmith, MD  diltiazem  (CARDIZEM  CD) 240 MG 24 hr capsule TAKE 1 CAPSULE BY MOUTH EVERY DAY Patient taking differently: Take 240 mg by mouth daily. 02/19/21   Patwardhan, Kaye Parsons, MD  esomeprazole  (NEXIUM ) 20 MG capsule Take 40 mg by mouth daily at 12 noon.    [provider]  fluconazole  (DIFLUCAN ) 150 MG tablet Take 150 mg by mouth once a week.    [provider]  HYDROcodone -acetaminophen  (NORCO/VICODIN) 5-325 MG tablet Take 1 tablet by mouth  every 6 (six) hours as needed. 08/12/23   Schutt, Coni Deep, PA-C  losartan  (COZAAR ) 25 MG tablet Take 2 tablets (50 mg total) by mouth daily. 09/27/22 01/19/23  Veronica Gordon, MD  mesalamine  (LIALDA ) 1.2 g EC tablet Take 2 tablets (2.4 g total) by mouth daily with breakfast. 01/20/23 02/19/23  Asencion Blacksmith, MD  metroNIDAZOLE  (FLAGYL ) 500 MG tablet Take 1 tablet (500 mg total) by mouth 2 (two) times daily. 12/21/22   Lucina Sabal, PA-C  ondansetron  (ZOFRAN -ODT) 4 MG disintegrating tablet Take 1 tablet (4 mg total) by mouth every 8 (eight) hours as needed for nausea or vomiting. 08/12/23   Schutt, Coni Deep, PA-C  polyethylene glycol (MIRALAX  / GLYCOLAX ) 17 g packet Take 17 g by mouth daily as needed. 09/27/22   Ezenduka,  Elijio Guadeloupe, MD  rivaroxaban  (XARELTO ) 20 MG TABS tablet Take 1 tablet (20 mg total) by mouth daily with supper. 10/04/22   Ninetta Basket, MD  rosuvastatin  (CRESTOR ) 40 MG tablet Take 1 tablet (40 mg total) by mouth once a week. Patient taking differently: Take 40 mg by mouth every Friday. 12/27/17   Bettie Bruce, PA-C  SUMAtriptan  (IMITREX ) 50 MG tablet Take 1 tablet (50 mg total) by mouth once as needed for migraine. May repeat in 2 hours if headache persists or recurs. 07/19/23 07/19/24  Sandi Crosby, PA-C  tamsulosin  (FLOMAX ) 0.4 MG CAPS capsule Take 1 capsule (0.4 mg total) by mouth daily. 04/05/18   Delbra Feeler D, PA-C  tiZANidine  (ZANAFLEX ) 4 MG tablet Take 4 mg by mouth as directed.    [provider]  valACYclovir  (VALTREX ) 500 MG tablet Take 1 tablet (500 mg total) by mouth daily. 12/30/17   Bettie Bruce, PA-C  lisinopril -hydrochlorothiazide  (PRINZIDE ,ZESTORETIC ) 20-25 MG per tablet Take 1 tablet by mouth daily. 07/24/11 08/21/11  Hillery Lown, MD    Physical Exam: Vitals:   09/21/23 0400 09/21/23 0500 09/21/23 0520 09/21/23 0605  BP: (!) 147/108 (!) 150/115 (!) 140/105 (!) 150/114  Pulse: 85 92 80 87  Resp: 18 20 18 18   Temp: 98.5 F  (36.9 C) 98 F (36.7 C) 98.2 F (36.8 C) 98.4 F (36.9 C)  TempSrc: Oral   Oral  SpO2: 100% 96% 95% 95%  Weight:      Height:       Constitutional: Uncomfortable middle aged caucasian gentleman Eyes: PERRL, lids and conjunctivae normal ENMT: Mucous membranes are moist. Posterior pharynx clear of any exudate or lesions.Normal dentition.  Neck: normal, supple, no masses, no thyromegaly Respiratory: clear to auscultation bilaterally, no wheezing, no crackles. Normal respiratory effort. No accessory muscle use.  Cardiovascular: Regular rate and rhythm, no murmurs / rubs / gallops. No extremity edema. 2+ radial and pedal pulses. No carotid bruits.  Abdomen: LLQ+RLQ abdominal tenderness and guarding, no CVA tenderness, no masses palpated. No hepatosplenomegaly. Bowel sounds positive x 4 quadrants.  Musculoskeletal: no clubbing / cyanosis. No joint deformity upper and lower extremities. Good ROM, no contractures. Normal muscle tone.  Skin: Warm, dry. Color ethnically appropriate. No rashes, lesions, ulcers.  Neurologic: CN 2-12 grossly intact. Alert and oriented x 3. Normal mood.   Data Reviewed: CBC    Component Value Date/Time   WBC 11.4 (H) 09/21/2023 0640   RBC 4.87 09/21/2023 0640   HGB 14.9 09/21/2023 0640   HGB 14.4 10/27/2022 1022   HGB 14.4 07/23/2017 1201   HGB 14.9 05/24/2017 0845   HCT 43.3 09/21/2023 0640   HCT 42.2 07/23/2017 1201   HCT 42.2 05/24/2017 0845   PLT 297 09/21/2023 0640   PLT 258 10/27/2022 1022   PLT 356 07/23/2017 1201   MCV 88.9 09/21/2023 0640   MCV 90.1 02/08/2018 1608   MCV 85 07/23/2017 1201   MCV 87 05/24/2017 0845   MCH 30.6 09/21/2023 0640   MCHC 34.4 09/21/2023 0640   RDW 12.3 09/21/2023 0640   RDW 13.7 07/23/2017 1201   RDW 13.0 05/24/2017 0845   LYMPHSABS 2.1 09/21/2023 0640   LYMPHSABS 2.9 05/24/2017 0845   MONOABS 1.0 09/21/2023 0640   EOSABS 0.2 09/21/2023 0640   EOSABS 0.2 05/24/2017 0845   BASOSABS 0.1 09/21/2023 0640    BASOSABS 0.1 05/24/2017 0845   CMP     Component Value Date/Time   NA 135 09/21/2023 0640  NA 140 04/05/2018 1119   NA 141 05/24/2017 0845   K 3.5 09/21/2023 0640   K 3.6 05/24/2017 0845   CL 100 09/21/2023 0640   CL 106 05/24/2017 0845   CO2 24 09/21/2023 0640   CO2 26 05/24/2017 0845   GLUCOSE 105 (H) 09/21/2023 0640   GLUCOSE 97 05/24/2017 0845   BUN 11 09/21/2023 0640   BUN 17 04/05/2018 1119   BUN 17 05/24/2017 0845   CREATININE 1.19 09/21/2023 0640   CREATININE 1.11 10/27/2022 1022   CREATININE 1.4 (H) 05/24/2017 0845   CALCIUM  9.0 09/21/2023 0640   CALCIUM  9.0 05/24/2017 0845   PROT 6.8 09/21/2023 0640   PROT 7.8 04/05/2018 1119   PROT 7.5 05/24/2017 0845   ALBUMIN 3.8 09/21/2023 0640   ALBUMIN 4.9 04/05/2018 1119   AST 15 09/21/2023 0640   AST 16 10/27/2022 1022   ALT 17 09/21/2023 0640   ALT 15 10/27/2022 1022   ALT 35 05/24/2017 0845   ALKPHOS 54 09/21/2023 0640   ALKPHOS 66 05/24/2017 0845   BILITOT 1.9 (H) 09/21/2023 0640   BILITOT 0.6 10/27/2022 1022   GFR 64.68 10/07/2022 1155   GFRNONAA >60 09/21/2023 0640   GFRNONAA >60 10/27/2022 1022   Lipase     Component Value Date/Time   LIPASE <10 (L) 09/20/2023 1826   Urinalysis    Component Value Date/Time   COLORURINE YELLOW 09/20/2023 2257   APPEARANCEUR CLEAR 09/20/2023 2257   LABSPEC 1.026 09/20/2023 2257   PHURINE 5.5 09/20/2023 2257   GLUCOSEU NEGATIVE 09/20/2023 2257   GLUCOSEU NEGATIVE 09/02/2015 1443   HGBUR MODERATE (A) 09/20/2023 2257   BILIRUBINUR NEGATIVE 09/20/2023 2257   BILIRUBINUR negative 04/05/2018 1102   BILIRUBINUR negative 04/26/2017 1448   KETONESUR NEGATIVE 09/20/2023 2257   PROTEINUR TRACE (A) 09/20/2023 2257   UROBILINOGEN 0.2 04/05/2018 1102   UROBILINOGEN 0.2 09/02/2015 1443   NITRITE NEGATIVE 09/20/2023 2257   LEUKOCYTESUR LARGE (A) 09/20/2023 2257    Results for orders placed or performed during the hospital encounter of 08/12/23  Resp panel by RT-PCR (RSV, Flu  A&B, Covid) Anterior Nasal Swab     Status: None   Collection Time: 08/12/23 12:30 PM   Specimen: Anterior Nasal Swab  Result Value Ref Range Status   SARS Coronavirus 2 by RT PCR NEGATIVE NEGATIVE Final    Comment: (NOTE) SARS-CoV-2 target nucleic acids are NOT DETECTED.  The SARS-CoV-2 RNA is generally detectable in upper respiratory specimens during the acute phase of infection. The lowest concentration of SARS-CoV-2 viral copies this assay can detect is 138 copies/mL. A negative result does not preclude SARS-Cov-2 infection and should not be used as the sole basis for treatment or other patient management decisions. A negative result may occur with  improper specimen collection/handling, submission of specimen other than nasopharyngeal swab, presence of viral mutation(s) within the areas targeted by this assay, and inadequate number of viral copies(<138 copies/mL). A negative result must be combined with clinical observations, patient history, and epidemiological information. The expected result is Negative.  Fact Sheet for Patients:  BloggerCourse.com  Fact Sheet for Healthcare Providers:  SeriousBroker.it  This test is no t yet approved or cleared by the [place]  FDA and  has been authorized for detection and/or diagnosis of SARS-CoV-2 by FDA under an Emergency Use Authorization (EUA). This EUA will remain  in effect (meaning this test can be used) for the duration of the COVID-19 declaration under Section 564(b)(1) of the Act, 21 U.S.C.section 360bbb-3(b)(1), unless  the authorization is terminated  or revoked sooner.       Influenza A by PCR NEGATIVE NEGATIVE Final   Influenza B by PCR NEGATIVE NEGATIVE Final    Comment: (NOTE) The Xpert Xpress SARS-CoV-2/FLU/RSV plus assay is intended as an aid in the diagnosis of influenza from Nasopharyngeal swab specimens and should not be used as a sole basis for treatment.  Nasal washings and aspirates are unacceptable for Xpert Xpress SARS-CoV-2/FLU/RSV testing.  Fact Sheet for Patients: BloggerCourse.com  Fact Sheet for Healthcare Providers: SeriousBroker.it  This test is not yet approved or cleared by the [place]  FDA and has been authorized for detection and/or diagnosis of SARS-CoV-2 by FDA under an Emergency Use Authorization (EUA). This EUA will remain in effect (meaning this test can be used) for the duration of the COVID-19 declaration under Section 564(b)(1) of the Act, 21 U.S.C. section 360bbb-3(b)(1), unless the authorization is terminated or revoked.     Resp Syncytial Virus by PCR NEGATIVE NEGATIVE Final    Comment: (NOTE) Fact Sheet for Patients: BloggerCourse.com  Fact Sheet for Healthcare Providers: SeriousBroker.it  This test is not yet approved or cleared by the [place]  FDA and has been authorized for detection and/or diagnosis of SARS-CoV-2 by FDA under an Emergency Use Authorization (EUA). This EUA will remain in effect (meaning this test can be used) for the duration of the COVID-19 declaration under Section 564(b)(1) of the Act, 21 U.S.C. section 360bbb-3(b)(1), unless the authorization is terminated or revoked.  Performed at [facility], [address] [place], [place] [address]    CT ABDOMEN PELVIS W CONTRAST Result Date: 09/21/2023 CLINICAL DATA:  Left lower quadrant pain. EXAM: CT ABDOMEN AND PELVIS WITH CONTRAST TECHNIQUE: Multidetector CT imaging of the abdomen and pelvis was performed using the standard protocol following bolus administration of intravenous contrast. RADIATION DOSE REDUCTION: This exam was performed according to the departmental dose-optimization program which includes automated exposure control, adjustment of the mA and/or kV according to patient size and/or use of  iterative reconstruction technique. CONTRAST:  OMNIPAQUE  IOHEXOL  300 MG/ML  SOLN COMPARISON:  August 12, 2023 FINDINGS: Lower chest: No acute abnormality. Hepatobiliary: There is diffuse fatty infiltration of the liver parenchyma. No focal liver abnormality is seen. No gallstones, gallbladder wall thickening, or biliary dilatation. Pancreas: Unremarkable. No pancreatic ductal dilatation or surrounding inflammatory changes. Spleen: Normal in size without focal abnormality. Adrenals/Urinary Tract: Adrenal glands are unremarkable. Kidneys are normal, without obstructing renal calculi, focal lesion, or hydronephrosis. 1 mm and 2 mm nonobstructing renal calculi are seen within the left kidney. Bladder is unremarkable. Stomach/Bowel: Stomach is within normal limits. Appendix appears normal. No evidence of bowel wall thickening, distention, or inflammatory changes. Markedly inflamed diverticula are seen within the mid sigmoid colon. A 2.6 cm x 2.1 cm by 2.1 cm area of low attenuation (approximately 31.7 Hounsfield units) is also seen within this portion of the sigmoid colon. There is no evidence of associated free air. Vascular/Lymphatic: Aortic atherosclerosis. No enlarged abdominal or pelvic lymph nodes. Reproductive: Prostate is unremarkable. Other: No abdominal wall hernia or abnormality. No abdominopelvic ascites. Musculoskeletal: No acute or significant osseous findings. IMPRESSION: 1. Marked severity sigmoid diverticulitis with additional findings that may represent early abscess formation. 2. Small nonobstructing left renal calculi. Electronically Signed   By: Virgle Grime M.D.   On: 09/21/2023 03:42    Assessment and Plan: Acute Medical Problems:  ##Recurrent Diverticulitis - IV Ciprofloxacin  + Flagyl  - Bowel rest for now - IVF -  PRN Analgesia and Antiemetics - Area representing possible early abscess on imaging is less than 3cm, will hold on IR consultation for now as antibiotics should  suffice  ##Urinary Tract Infection Patient endorses urinary hesitancy and dysuria. UA (+) for leukocytes, bacteria, and HGB. - IV ciprofloxacin  - Urine culture  Chronic Medical Problems:  #Hypertension- continue home Cardizem  and Losartan  #Hx DVT- continue home Xarelto  #Migraines- Continue home Sumatriptan  #Hyperlipidemia- Hold home Crestor   VTE prophylaxis: Xarelto   GI prophylaxis: Protonix  Diet: NPO Access: PIV Lines: NONE Code Status: FULL Telemetry: No Disposition: Patient is from: Home  Anticipated d/c is to: Home  Anticipated d/c date is: 09/24/2023 Patient currently: Awaiting GI evaluation and symptomatic improvement. On IV antibiotics   Advance Care Planning:   Code Status: Full Code   Consults: [facility]  Family Communication: Wife Angel at bedside  Severity of Illness: The appropriate patient status for this patient is INPATIENT. Inpatient status is judged to be reasonable and necessary in order to provide the required intensity of service to ensure the patient's safety. The patient's presenting symptoms, physical exam findings, and initial radiographic and laboratory data in the context of their chronic comorbidities is felt to place them at high risk for further clinical deterioration. Furthermore, it is not anticipated that the patient will be medically stable for discharge from the hospital within 2 midnights of admission.   * I certify that at the point of admission it is my clinical judgment that the patient will require inpatient hospital care spanning beyond 2 midnights from the point of admission due to high intensity of service, high risk for further deterioration and high frequency of surveillance required.*  To reach the provider On-Call:   7AM- 7PM see care teams to locate the attending and reach out to them via [email or web address] Password: [email or web address] 7PM-7AM contact night-coverage If you still have difficulty reaching the appropriate provider, please page the  [facility] (Director on Call) for [facility] on amion for assistance  This document was prepared using Conservation officer, historic buildings and may include unintentional dictation errors.  Naida Austria FNP-BC, PMHNP-BC Nurse Practitioner [facility]

## 2023-09-21 NOTE — Plan of Care (Signed)
   Problem: Health Behavior/Discharge Planning: Goal: Ability to manage health-related needs will improve Outcome: Progressing

## 2023-09-21 NOTE — Progress Notes (Signed)
   Patient Name: elson, ulbrich DOB: Mar 05, 1971 MRN: [id number] Transferring facility: [facility] Requesting provider:  Bolivar Bushman, MD Reason for transfer: diverticulitis with abscess formation 53 yo WM with hx of HTN, hx of DVT(still on Xarelto ), hx of diverticulitis, presents to ER with 1 day of abd pain. CT abd shows diverticulitis and possible abscess. started on IV zosyn .  Going to: [facility] Admission Status: inpatient Bed Type: med/sug To Do: may need general surgery consult and/or IR consult  [facility] will assume care on arrival to accepting facility. Until arrival, medical decision making responsibilities remain with the EDP.  However, [facility] available 24/7 for questions and assistance.   Nursing staff please page [facility] Admits and Consults [phone]) as soon as the patient arrives to the hospital.  Unk Garb, DO [facility]

## 2023-09-21 NOTE — Progress Notes (Signed)
(  Carryover admission to the Day Admitter; accepted by Dr.  Farrel Hones as transfer from  [facility]  to a  med-surg bed at  [facility]  for acute diverticulitis with abscess. Please see Dr.  Shearon Denis  transfer progress note for additional details).    I have placed some additional preliminary admit orders via the adult multi-morbid admission order set. I have also ordered n.p.o., prn IV Zofran , and continue the Zosyn  started at [facility].  Will also continue the existing order for prn Dilaudid , ordered updated morning labs including a CBC, CBC, and magnesium  level.    Camelia Cavalier, DO Hospitalist

## 2023-09-21 NOTE — Consult Note (Addendum)
 Consultation  Referring Provider: TRH/ Foust NP Primary Care Physician:  Bettie Bruce, PA-C Primary Gastroenterologist:  Dr.Stark  Reason for Consultation: Recurrent diverticulitis with possible early abscess   Attending physician's note  I personally saw the patient and performed a substantive portion of this encounter (>50% time spent), including a complete performance of at least one of the key components (MDM, Hx and/or Exam), in conjunction with the APP.  I agree with the APP's note, impression, and recommendations with additional input as follows.     53 year old male with recurrent acute sigmoid diverticulitis with possible 2.6 X2.1X 2.1 cm evolving abscess Continue IV Zosyn , he may need IV antibiotics for 3 to 4 days followed by oral antibiotics to complete 10 to 14-day course Continue clear liquid diet If his symptoms are improving will plan for repeat CT abdomen pelvis with contrast in 10-14 days to document resolution of diverticulitis but if his symptoms do not improve will plan to repeat CT in 48 to 72 hours to evaluate for possible drainage   The patient was provided an opportunity to ask questions and all were answered. The patient agreed with the plan and demonstrated an understanding of the instructions.  Lorena Rolling , MD [phone]     HPI: Benjamin [name] is a 53 y.o. male, known to Dr. Sandrea Cruel, last seen in our office in November 2024, who has history of several episodes of diverticulitis.  On conversation today he thinks he has had at least 5 episodes, and this is the third episode that has brought him to the hospital. He last had colonoscopy in August 2024 with removal of one 5 mm polyp from the transverse colon and was noted to have multiple medium and small diverticuli in the left colon with evidence of diverticular spasm Polyp was a tubular adenoma. He also EGD at that same setting for follow-up of short segment Barrett's.  Biopsies showed evidence  of Barrett's esophagus, no dysplasia and gastric biopsies were negative for H. pylori.  Patient also has history of factor V deficiency with previous DVT and is maintained on chronic Xarelto .  As he had onset of abdominal pain in the left lower quadrant yesterday morning which progressed throughout the day and became severe by the time he came to the emergency room.  He did have associated nausea, no vomiting, no fever or chills at home.  He was able to have a bowel movement yesterday, no melena or hematochezia.  Workup in the ER showed WBC of 15.4/hemoglobin 16.3/hematocrit 45.6 Potassium 3.7/BUN 17/creatinine 1.0 LFTs normal UA was positive.  CT of the abdomen/pelvis with contrast showed hepatic steatosis and markedly inflamed diverticuli within the mid sigmoid colon.  There is a 2.6 x 2.1 x 2.1 cm area of low-attenuation seen within this area of the sigmoid colon, no associated free air, possible early abscess.  He has been started on IV Zosyn , and just sipping on water. Of that his pain has improved since yesterday and is now a 7 out of 10 when the pain medications wear off.  He has not had surgical evaluation in the past.   Past Medical History:  Diagnosis Date   Acute DVT of left tibial vein (HCC) 04/24/2017   Xarelto  started (dx'd in emergency dept).  Dr. Maria Shiner recommended full dose xarelto  x 52mo, with an additional year of maintenance dose xarelto  (as of 05/2017 initial consult note).  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  Pt  Factor V leiden heterozygote.  Stable as of 09/2017 hem f/u.   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  Pt referred for sleep study.   Chronic renal insufficiency, stage II (mild)    GFR 60s-70s (suspect HTN and NSAID damage as etiology).  GFR dipped into 50s fall 2018 after lots of NSAID use for chronic daily HA's.   Diverticulitis 2016   CT 01/2015   Frequent headaches    Gastric polyp 2006   GERD without esophagitis    Herpes labialis    Hiatal hernia    Hyperlipidemia    HYPERTENSION 01/05/2007   OSA (obstructive sleep apnea) 04/2017   Dr. Omar Bibber eval 04/2017-plan for sleep study.   PSORIASIS 01/05/2007    Past Surgical History:  Procedure Laterality Date   COLONOSCOPY W/ POLYPECTOMY  10/04/15   7mm tubular adenoma + diverticulosis: recall 5 yrs (Dr. Sandrea Cruel)   ESOPHAGOGASTRODUODENOSCOPY  04/12/14   Barrett's esophagus   LIPOMA EXCISION     several, arm    Prior to Admission medications   Medication Sig Start Date End Date Taking? Authorizing Provider  acetaminophen  (TYLENOL ) 500 MG tablet Take 500-1,000 mg by mouth every 6 (six) hours as needed for moderate pain (pain score 4-6).   Yes [provider]  buPROPion  (WELLBUTRIN  XL) 150 MG 24 hr tablet Take 450 mg by mouth daily.   Yes [provider]  dicyclomine  (BENTYL ) 10 MG capsule Take 1 capsule (10 mg total) by mouth 4 (four) times daily -  before meals and at bedtime. Patient taking differently: Take 10 mg by mouth 4 (four) times daily as needed for spasms. 10/07/22  Yes Asencion Blacksmith, MD  diltiazem  (CARDIZEM  CD) 240 MG 24 hr capsule TAKE 1 CAPSULE BY MOUTH EVERY DAY 02/19/21  Yes Patwardhan, Manish J, MD  esomeprazole  (NEXIUM ) 20 MG capsule Take 40 mg by mouth daily at 12 noon.   Yes [provider]  fluconazole  (DIFLUCAN ) 150 MG tablet Take 150 mg by mouth  once a week.   Yes [provider]  HYDROcodone -acetaminophen  (NORCO/VICODIN) 5-325 MG tablet Take 1 tablet by mouth every 6 (six) hours as needed. 08/12/23  Yes Schutt, Coni Deep, PA-C  losartan  (COZAAR ) 25 MG tablet Take 2 tablets (50 mg total) by mouth daily. Patient taking differently: Take 25 mg by mouth daily. 09/27/22 09/21/23 Yes Ezenduka, Nkeiruka J, MD  ondansetron  (ZOFRAN -ODT) 4 MG disintegrating tablet Take 1 tablet (4 mg total) by mouth every 8 (eight) hours as needed for nausea or vomiting. 08/12/23  Yes Schutt, Coni Deep, PA-C  polyethylene glycol (MIRALAX  / GLYCOLAX ) 17 g packet Take 17 g by mouth daily as needed. 09/27/22  Yes Ezenduka, Nkeiruka J, MD  rivaroxaban  (XARELTO ) 20 MG TABS tablet Take 1 tablet (20 mg total) by mouth daily with supper. 10/04/22  Yes Ninetta Basket, MD  rosuvastatin  (CRESTOR ) 40 MG tablet Take 1 tablet (40 mg total)  by mouth once a week. 12/27/17  Yes Bettie Bruce, PA-C  SUMAtriptan  (IMITREX ) 50 MG tablet Take 1 tablet (50 mg total) by mouth once as needed for migraine. May repeat in 2 hours if headache persists or recurs. 07/19/23 07/19/24 Yes Sandi Crosby, PA-C  tamsulosin  (FLOMAX ) 0.4 MG CAPS capsule Take 1 capsule (0.4 mg total) by mouth daily. 04/05/18  Yes Sixto Duhamel, Brittany D, PA-C  tiZANidine  (ZANAFLEX ) 4 MG tablet Take 4 mg by mouth every 8 (eight) hours as needed for muscle spasms.   Yes [provider]  valACYclovir  (VALTREX ) 500 MG tablet Take 1 tablet (500 mg total) by mouth daily. 12/30/17  Yes Bettie Bruce, PA-C  lisinopril -hydrochlorothiazide  (PRINZIDE ,ZESTORETIC ) 20-25 MG per tablet Take 1 tablet by mouth daily. 07/24/11 08/21/11  Hillery Lown, MD    Current Facility-Administered Medications  Medication Dose Route Frequency Provider Last Rate Last Admin   0.9 %  sodium chloride  infusion   Intravenous Continuous Raul Cabot, Katy L, NP 100 mL/hr at 09/21/23 0849 New Bag at 09/21/23 0849   acetaminophen  (TYLENOL )  tablet 650 mg  650 mg Oral Q6H PRN Howerter, Justin B, DO   650 mg at 09/21/23 1610   Or   acetaminophen  (TYLENOL ) suppository 650 mg  650 mg Rectal Q6H PRN Howerter, Justin B, DO       buPROPion  (WELLBUTRIN  XL) 24 hr tablet 450 mg  450 mg Oral Daily Foust, Katy L, NP       ciprofloxacin  (CIPRO ) IVPB 400 mg  400 mg Intravenous Q12H Dang, Thuy D, RPH       diltiazem  (CARDIZEM  CD) 24 hr capsule 240 mg  240 mg Oral Daily Foust, Katy L, NP       HYDROmorphone  (DILAUDID ) injection 1 mg  1 mg Intravenous Q2H PRN Sheldon, Charles B, MD   1 mg at 09/21/23 0440   losartan  (COZAAR ) tablet 25 mg  25 mg Oral Daily Foust, Katy L, NP       metroNIDAZOLE  (FLAGYL ) IVPB 500 mg  500 mg Intravenous Q12H Foust, Katy L, NP 100 mL/hr at 09/21/23 1057 500 mg at 09/21/23 1057   ondansetron  (ZOFRAN ) injection 4 mg  4 mg Intravenous Q6H PRN Howerter, Justin B, DO       pantoprazole  (PROTONIX ) EC tablet 40 mg  40 mg Oral Daily Foust, Katy L, NP       rivaroxaban  (XARELTO ) tablet 20 mg  20 mg Oral Q supper Foust, Katy L, NP       SUMAtriptan  (IMITREX ) tablet 50 mg  50 mg Oral Q2H PRN Foust, Katy L, NP       tamsulosin  (FLOMAX ) capsule 0.4 mg  0.4 mg Oral Daily Foust, Katy L, NP       valACYclovir  (VALTREX ) tablet 500 mg  500 mg Oral Daily Foust, Katy L, NP       Facility-Administered Medications Ordered in Other Encounters  Medication Dose Route Frequency Provider Last Rate Last Admin   gadopentetate dimeglumine  (MAGNEVIST ) injection 18 mL  18 mL Intravenous Once PRN Glory Larsen, MD        Allergies as of 09/20/2023 - Review Complete 09/20/2023  Allergen Reaction Noted   Bismuth subsalicylate Swelling and Other (See Comments) 08/09/2015   Compazine  [prochlorperazine  edisylate] Other (See Comments) 03/24/2017    Family History  Problem Relation Age of Onset   Hypertension Mother    CAD Father        around age 3   Heart disease Father  Hyperlipidemia Father    Hypertension Father    Hypertension  Brother    Hyperlipidemia Brother    Lung cancer Maternal Grandmother    Colon cancer Maternal Grandfather    Cancer Maternal Grandfather        type unknown   CAD Paternal Grandmother    Diabetes Paternal Grandmother    Colon cancer Other        neg hx   Prostate cancer Other        neg hx   Esophageal cancer Neg Hx    Stomach cancer Neg Hx     Social History   Socioeconomic History   Marital status: Single    Spouse name: Not on file   Number of children: 3   Years of education: Not on file   Highest education level: Not on file  Occupational History   Occupation: regional Mudlogger    Comment: Industrial/product designer  Tobacco Use   Smoking status: Never   Smokeless tobacco: Never  Vaping Use   Vaping status: Never Used  Substance and Sexual Activity   Alcohol  use: Yes    Alcohol /week: 2.0 standard drinks of alcohol     Types: 2 Cans of beer per week    Comment: rare   Drug use: No   Sexual activity: Yes    Birth control/protection: None  Other Topics Concern   Not on file  Social History Narrative   Currently separated as of 01/2014, 2 children.  One younger brother.   Occupation: Airline pilot for [employer]   No tobacco.   Occ alcohol  (1-2 x/month).  No hx of alc/drug prob.   Exercise: runs about 2 times a week. Not as much lately due to medical state   Diet: regular american diet.   Right handed   1 cup coffee daily      Social Drivers of Health   Financial Resource Strain: Not on file  Food Insecurity: No Food Insecurity (09/21/2023)   Hunger Vital Sign    Worried About Running Out of Food in the Last Year: Never true    Ran Out of Food in the Last Year: Never true  Transportation Needs: No Transportation Needs (09/21/2023)   PRAPARE - Administrator, Civil Service (Medical): No    Lack of Transportation (Non-Medical): No  Physical Activity: Not on file  Stress: Not on file  Social Connections: Unknown (10/14/2021)   [facility]   Social Network    Social Network: Not on file  Intimate Partner Violence: Not At Risk (09/21/2023)   Humiliation, Afraid, Rape, and Kick questionnaire    Fear of Current or Ex-Partner: No    Emotionally Abused: No    Physically Abused: No    Sexually Abused: No    Review of Systems: Pertinent positive and negative review of systems were noted in the above HPI section.  All other review of systems was otherwise negative.   Physical Exam: Vital signs in last 24 hours: Temp:  [97.8 F (36.6 C)-98.5 F (36.9 C)] 98 F (36.7 C) (04/22 0720) Pulse Rate:  [73-95] 73 (04/22 0720) Resp:  [16-20] 18 (04/22 0720) BP: (128-150)/(100-115) 148/100 (04/22 0720) SpO2:  [86 %-100 %] 97 % (04/22 0720) Weight:  [86.2 kg] 86.2 kg (04/21 1815) Last BM Date : 09/20/23 General:   Alert,  Well-developed, well-nourished, pleasant and cooperative in NAD Head:  Normocephalic and atraumatic. Eyes:  Sclera clear, no icterus.   Conjunctiva pink.  Ears:  Normal auditory acuity. Nose:  No deformity, discharge,  or lesions. Mouth:  No deformity or lesions.   Neck:  Supple; no masses or thyromegaly. Lungs:  Clear throughout to auscultation.   No wheezes, crackles, or rhonchi.  Heart:  Regular rate and rhythm; no murmurs, clicks, rubs,  or gallops. Abdomen:  Soft, bowel sounds are present, he is quite tender in the left lower quadrant with some guarding, no true rebound, no palpable mass or hepatosplenomegaly Rectal: Not done Msk:  Symmetrical without gross deformities. . Pulses:  Normal pulses noted. Extremities:  Without clubbing or edema. Neurologic:  Alert and  oriented x4;  grossly normal neurologically. Skin:  Intact without significant lesions or rashes.. Psych:  Alert and cooperative. Normal mood and affect.  Intake/Output from previous day: 04/21 0701 - 04/22 0700 In: 1100 [IV Piggyback:1100] Out: -  Intake/Output this shift: No intake/output data recorded.  Lab  Results: Recent Labs    09/20/23 1826 09/21/23 0640  WBC 15.4* 11.4*  HGB 16.3 14.9  HCT 45.6 43.3  PLT 344 297   BMET Recent Labs    09/20/23 1826 09/21/23 0640  NA 135 135  K 3.7 3.5  CL 101 100  CO2 24 24  GLUCOSE 101* 105*  BUN 17 11  CREATININE 1.10 1.19  CALCIUM  9.9 9.0   LFT Recent Labs    09/21/23 0640  PROT 6.8  ALBUMIN 3.8  AST 15  ALT 17  ALKPHOS 54  BILITOT 1.9*   PT/INR No results for input(s): "LABPROT", "INR" in the last 72 hours. Hepatitis Panel No results for input(s): "HEPBSAG", "HCVAB", "HEPAIGM", "HEPBIGM" in the last 72 hours.    IMPRESSION:  #63 53 year old white male with history of recurrent diverticulitis, who had onset of left lower quadrant pain yesterday morning which rapidly progressed and became severe, prompting ER visit. CT of the abdomen pelvis shows acute sigmoid diverticulitis with possible early abscess measuring 2.6 x 2.1 x 2.1 cm, no free air.  on IV Zosyn   Patient has had previous episodes requiring hospitalization the last was in April 2024.  He is up-to-date with colonoscopy last done August 2024 as above  Pain is improving, and leukocytosis resolving He does endorse some pressure in the abdomen with urination  #2 history of GERD and short segment Barrett's-up-to-date with a screening  3 history of adenomatous colon polyps For factor V deficiency-prior DVT on chronic Xarelto   Plan; OK for full liquids today Continue IV Zosyn  Pain control Repeat labs in a.m.  There is no indication for drainage of this fully formed fluid collection at present. He will need reimaging, possibly prior to discharge to reassess for development of definite abscess.  If he does develop definite abscess 3 cm R over then could consider IR drainage.  I think he can be seen by surgery as an outpatient to consider elective resection no indication for surgery at present unless he would fail to improve.  Amy EsterwoodPA-C  09/21/2023, 12:25  PM

## 2023-09-22 DIAGNOSIS — K5732 Diverticulitis of large intestine without perforation or abscess without bleeding: Secondary | ICD-10-CM | POA: Diagnosis not present

## 2023-09-22 DIAGNOSIS — K5720 Diverticulitis of large intestine with perforation and abscess without bleeding: Secondary | ICD-10-CM | POA: Diagnosis not present

## 2023-09-22 DIAGNOSIS — D72829 Elevated white blood cell count, unspecified: Secondary | ICD-10-CM | POA: Diagnosis not present

## 2023-09-22 LAB — COMPREHENSIVE METABOLIC PANEL WITH GFR
ALT: 16 U/L (ref 0–44)
AST: 15 U/L (ref 15–41)
Albumin: 3.6 g/dL (ref 3.5–5.0)
Alkaline Phosphatase: 53 U/L (ref 38–126)
Anion gap: 15 (ref 5–15)
BUN: 10 mg/dL (ref 6–20)
CO2: 20 mmol/L — ABNORMAL LOW (ref 22–32)
Calcium: 8.7 mg/dL — ABNORMAL LOW (ref 8.9–10.3)
Chloride: 101 mmol/L (ref 98–111)
Creatinine, Ser: 1.05 mg/dL (ref 0.61–1.24)
GFR, Estimated: >60 mL/min (ref >60)
Glucose, Bld: 103 mg/dL — ABNORMAL HIGH (ref 70–99)
Potassium: 3.3 mmol/L — ABNORMAL LOW (ref 3.5–5.1)
Sodium: 136 mmol/L (ref 135–145)
Total Bilirubin: 1.2 mg/dL (ref 0.0–1.2)
Total Protein: 6.8 g/dL (ref 6.5–8.1)

## 2023-09-22 LAB — URINE CULTURE: Culture: NO GROWTH

## 2023-09-22 LAB — CBC WITH DIFFERENTIAL/PLATELET
Abs Immature Granulocytes: 0.06 10*3/uL (ref 0.00–0.07)
Basophils Absolute: 0.1 10*3/uL (ref 0.0–0.1)
Basophils Relative: 1 %
Eosinophils Absolute: 0.3 10*3/uL (ref 0.0–0.5)
Eosinophils Relative: 2 %
HCT: 42.9 % (ref 39.0–52.0)
Hemoglobin: 14.9 g/dL (ref 13.0–17.0)
Immature Granulocytes: 1 %
Lymphocytes Relative: 16 %
Lymphs Abs: 1.9 10*3/uL (ref 0.7–4.0)
MCH: 30.7 pg (ref 26.0–34.0)
MCHC: 34.7 g/dL (ref 30.0–36.0)
MCV: 88.5 fL (ref 80.0–100.0)
Monocytes Absolute: 1 10*3/uL (ref 0.1–1.0)
Monocytes Relative: 8 %
Neutro Abs: 8.2 10*3/uL — ABNORMAL HIGH (ref 1.7–7.7)
Neutrophils Relative %: 72 %
Platelets: 307 10*3/uL (ref 150–400)
RBC: 4.85 MIL/uL (ref 4.22–5.81)
RDW: 12.2 % (ref 11.5–15.5)
WBC: 11.5 10*3/uL — ABNORMAL HIGH (ref 4.0–10.5)
nRBC: 0 % (ref 0.0–0.2)

## 2023-09-22 LAB — PHOSPHORUS: Phosphorus: 3.7 mg/dL (ref 2.5–4.6)

## 2023-09-22 LAB — MAGNESIUM: Magnesium: 1.9 mg/dL (ref 1.7–2.4)

## 2023-09-22 MED ORDER — HYDROCODONE-ACETAMINOPHEN 7.5-325 MG PO TABS
1.0000 | ORAL_TABLET | Freq: Four times a day (QID) | ORAL | Status: DC | PRN
  Administered 2023-09-23 – 2023-09-24 (×3): 1 via ORAL
  Filled 2023-09-22 (×3): qty 1

## 2023-09-22 MED ORDER — SODIUM CHLORIDE 0.9 % IV SOLN
INTRAVENOUS | Status: AC
Start: 2023-09-22 — End: 2023-09-23

## 2023-09-22 MED ORDER — POTASSIUM CHLORIDE CRYS ER 20 MEQ PO TBCR
40.0000 meq | EXTENDED_RELEASE_TABLET | Freq: Every day | ORAL | Status: AC
  Administered 2023-09-22: 40 meq via ORAL
  Filled 2023-09-22: qty 2

## 2023-09-22 MED ORDER — POTASSIUM CHLORIDE CRYS ER 20 MEQ PO TBCR: 40.0000 meq | EXTENDED_RELEASE_TABLET | Freq: Every day | ORAL | Status: DC

## 2023-09-22 MED ORDER — ENOXAPARIN SODIUM 80 MG/0.8ML IJ SOSY
80.0000 mg | PREFILLED_SYRINGE | Freq: Two times a day (BID) | INTRAMUSCULAR | Status: DC
  Administered 2023-09-22 – 2023-09-24 (×5): 80 mg via SUBCUTANEOUS
  Filled 2023-09-22 (×7): qty 0.8

## 2023-09-22 NOTE — Progress Notes (Signed)
 Benjamin [name] HQI:[id number]  DOB: 1970-08-28  DOA: 09/20/2023  PCP: Bettie Bruce, PA-C  09/22/2023,8:18 AM  LOS: 1 day    Code Status: Full code   from: Home current Dispo: Unclear   53 year old black male Previous DVT on Xarelto  in the setting of factor V Leiden HTN HLD migraines BPH reflux Prior admission 10/12/2019 acute descending colon diverticulitis with phlegmon treated medically with antibiotics discharged on Augmentin -also treated again 09/22/2022 conservatively for same issue--last evaluated 12/2022 5 mm polyp transient Trulicity medium to small mouth diverticula left colon additionally-EGD showed inflammation erythema at the time  4/22 sharp abdominal pain LLQ urinary hesitancy dysuria-WBC 15.4 hemoglobin 16 platelet 344 sodium 135 potassium 3.7 BUN/creatinine 17/1.1 LFTs relatively normal lipase less than 10 CT ABD moderate severity sigmoid diverticulitis with additional findings representing possible early abscess formation --2.6 X2.1X 2.1 cm intra-abd abscess small nonobstructing left renal calculi Gastroenterology consulted-recommended that if definite abscess 3 cm or greater then recommend IR drainage  Plan  2.6 X2.1X 2.1 cm nonobstructive intra-abdominal abscess in the sigmoid colon Full liquid diet per GI-saline 75 cc/H-continue Cipro  400 every 12 Flagyl  500 every 12--WBC improving Pain control Dilaudid  1 mg every 2 as needed severe pain add hydrocodone  7.5 every 6 as needed moderate pain Imaging dependent on clinical course/improvement-if clinical deterioration, repeat CT in 48 hours  Short segment Barrett's esophagus reflux-is up-to-date on screening Protonix  40 daily outpatient interval screening  Mild hypokalemia Replace orally with K-Dur 40 X1-repeat labs a.m.  Factor V Leiden DVT in lower extremity , recent left upper extremity DVT provoked by IV on Xarelto  On Xarelto  20 daily-convert to Lovenox  injections given uncertainty of clinical  course  Depression continue bupropion  XL 450 daily  HTN moderately controlled Continue Cardizem  240 CD, losartan  50 daily  Migraine continue Imitrex  50, may use butalbital  1 every 4 as needed headache    DVT prophylaxis: Changed Xarelto  to Lovenox   Status is: Inpatient Remains inpatient appropriate because:   Requires further in-hospital management observation  Subjective: Awake coherent no distress some moderate pain 4/10 passing gas no stool no nausea no vomiting no chest pain  Objective + exam Vitals:   09/21/23 1355 09/21/23 1945 09/22/23 0529 09/22/23 0734  BP: (!) 149/114 (!) 137/94 (!) 140/98 (!) 145/92  Pulse: 87 86 86 90  Resp: 18 15 16 17   Temp: 98.1 F (36.7 C) 98.3 F (36.8 C) 98.5 F (36.9 C) 97.9 F (36.6 C)  TempSrc:  Oral    SpO2: 97% 95% 96% 94%  Weight:      Height:       Filed Weights   09/20/23 1815  Weight: 86.2 kg    Examination: EOMI NCAT no focal deficit no icterus no pallor no wheeze no rales no rhonchi S1-S2 no murmur no rub no gallop Chest is clear no wheeze Abdomen slightly tender left lower quadrant ROM intact extremities not swollen appearing Power 5/5  Data Reviewed: reviewed   CBC    Component Value Date/Time   WBC 11.5 (H) 09/22/2023 0715   RBC 4.85 09/22/2023 0715   HGB 14.9 09/22/2023 0715   HGB 14.4 10/27/2022 1022   HGB 14.4 07/23/2017 1201   HGB 14.9 05/24/2017 0845   HCT 42.9 09/22/2023 0715   HCT 42.2 07/23/2017 1201   HCT 42.2 05/24/2017 0845   PLT 307 09/22/2023 0715   PLT 258 10/27/2022 1022   PLT 356 07/23/2017 1201   MCV 88.5 09/22/2023 0715   MCV 90.1  02/08/2018 1608   MCV 85 07/23/2017 1201   MCV 87 05/24/2017 0845   MCH 30.7 09/22/2023 0715   MCHC 34.7 09/22/2023 0715   RDW 12.2 09/22/2023 0715   RDW 13.7 07/23/2017 1201   RDW 13.0 05/24/2017 0845   LYMPHSABS 1.9 09/22/2023 0715   LYMPHSABS 2.9 05/24/2017 0845   MONOABS 1.0 09/22/2023 0715   EOSABS 0.3 09/22/2023 0715   EOSABS 0.2  05/24/2017 0845   BASOSABS 0.1 09/22/2023 0715   BASOSABS 0.1 05/24/2017 0845      Latest Ref Rng & Units 09/21/2023    6:40 AM 09/20/2023    6:26 PM 08/12/2023   12:30 PM  CMP  Glucose 70 - 99 mg/dL 147  829  562   BUN 6 - 20 mg/dL 11  17  21    Creatinine 0.61 - 1.24 mg/dL 1.30  8.65  7.84   Sodium 135 - 145 mmol/L 135  135  139   Potassium 3.5 - 5.1 mmol/L 3.5  3.7  3.5   Chloride 98 - 111 mmol/L 100  101  106   CO2 22 - 32 mmol/L 24  24  21    Calcium  8.9 - 10.3 mg/dL 9.0  9.9  9.2   Total Protein 6.5 - 8.1 g/dL 6.8  7.9  7.6   Total Bilirubin 0.0 - 1.2 mg/dL 1.9  1.1  1.1   Alkaline Phos 38 - 126 U/L 54  73  66   AST 15 - 41 U/L 15  16  15    ALT 0 - 44 U/L 17  17  16      Scheduled Meds:  buPROPion   450 mg Oral Daily   diltiazem   240 mg Oral Daily   losartan   25 mg Oral Daily   pantoprazole   40 mg Oral Daily   rivaroxaban   20 mg Oral Q supper   tamsulosin   0.4 mg Oral Daily   valACYclovir   500 mg Oral Daily   Continuous Infusions:  sodium chloride  100 mL/hr at 09/21/23 2136   ciprofloxacin  400 mg (09/21/23 2350)   metronidazole  500 mg (09/21/23 2239)    Time  46  Verlie Glisson, MD  [facility]

## 2023-09-22 NOTE — Plan of Care (Signed)
  Problem: Education: Goal: Knowledge of General Education information will improve Description: Including pain rating scale, medication(s)/side effects and non-pharmacologic comfort measures Outcome: Progressing   Problem: Health Behavior/Discharge Planning: Goal: Ability to manage health-related needs will improve Outcome: Progressing   Problem: Clinical Measurements: Goal: Ability to maintain clinical measurements within normal limits will improve Outcome: Progressing   Problem: Activity: Goal: Risk for activity intolerance will decrease Outcome: Progressing   Problem: Nutrition: Goal: Adequate nutrition will be maintained Outcome: Progressing   Problem: Pain Managment: Goal: General experience of comfort will improve and/or be controlled Outcome: Not Progressing   Problem: Coping: Goal: Level of anxiety will decrease Outcome: Progressing

## 2023-09-22 NOTE — Plan of Care (Signed)
  Problem: Education: Goal: Knowledge of General Education information will improve Description: Including pain rating scale, medication(s)/side effects and non-pharmacologic comfort measures Outcome: Progressing   Problem: Health Behavior/Discharge Planning: Goal: Ability to manage health-related needs will improve Outcome: Progressing   Problem: Clinical Measurements: Goal: Will remain free from infection Outcome: Progressing   Problem: Elimination: Goal: Will not experience complications related to bowel motility Outcome: Progressing   Problem: Pain Managment: Goal: General experience of comfort will improve and/or be controlled Outcome: Progressing

## 2023-09-22 NOTE — Progress Notes (Signed)
 PHARMACY - ANTICOAGULATION CONSULT NOTE  Pharmacy Consult:  Lovenox  Indication: History of DVT and Factor V Leiden  Allergies  Allergen Reactions   Bismuth Subsalicylate Swelling and Other (See Comments)   Compazine  [Prochlorperazine  Edisylate] Other (See Comments)    Cervical dystonia and panic attack    Patient Measurements: Height: 5\' 9"  (175.3 cm) Weight: 86.2 kg (190 lb) IBW/kg (Calculated) : 70.7 HEPARIN DW (KG): 86.2  Vital Signs: Temp: 97.9 F (36.6 C) (04/23 0734) BP: 145/92 (04/23 0734) Pulse Rate: 90 (04/23 0734)  Labs: Recent Labs    09/20/23 1826 09/21/23 0640 09/22/23 0715  HGB 16.3 14.9 14.9  HCT 45.6 43.3 42.9  PLT 344 297 307  CREATININE 1.10 1.19 1.05    Estimated Creatinine Clearance: 87.5 mL/min (by C-G formula based on SCr of 1.05 mg/dL).   Medical History: Past Medical History:  Diagnosis Date   Acute DVT of left tibial vein (HCC) 04/24/2017   Xarelto  started (dx'd in emergency dept).  Dr. Maria Shiner recommended full dose xarelto  x 59mo, with an additional year of maintenance dose xarelto  (as of 05/2017 initial consult note).  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  Pt Factor V leiden heterozygote.  Stable as of 09/2017 hem f/u.   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  Pt referred for sleep study.   Chronic renal insufficiency, stage II (mild)    GFR  60s-70s (suspect HTN and NSAID damage as etiology).  GFR dipped into 50s fall 2018 after lots of NSAID use for chronic daily HA's.   Diverticulitis 2016   CT 01/2015   Frequent headaches    Gastric polyp 2006   GERD without esophagitis    Herpes labialis    Hiatal hernia    Hyperlipidemia    HYPERTENSION 01/05/2007   OSA (obstructive sleep apnea) 04/2017   Dr. Omar Bibber eval 04/2017-plan for sleep study.   PSORIASIS 01/05/2007    Assessment: 81 YOM with history of Afib and Factor V Leiden on Xarelto , last dose 4/22 PM.  Patient admitted with diverticulitis and possible abscess, so Pharmacy consulted to transition Xarelto  to therapeutic Lovenox  given uncertain clinical course.  Renal function stable; CBC stable; no bleeding reported.  Goal of Therapy:  Anti-Xa level 0.6-1 units/ml 4hrs after LMWH dose given Monitor platelets by anticoagulation protocol: Yes   Plan:  Lovenox  80mg  SQ Q12H, start this evening CBC Q72H while on Lovenox  F/u with transitioning back to Xarelto  when able  Kinlie Janice D. Marikay Show, PharmD, BCPS, BCCCP 09/22/2023, 9:56 AM

## 2023-09-22 NOTE — Progress Notes (Signed)
 [facility] ROUNDING NOTE   Subjective: Persistent left lower quadrant pain, slightly better compared to yesterday.  No fever or vomiting.   Objective: Vital signs in last 24 hours: Temp:  [97.9 F (36.6 C)-98.5 F (36.9 C)] 97.9 F (36.6 C) (04/23 0734) Pulse Rate:  [86-90] 90 (04/23 0734) Resp:  [15-18] 17 (04/23 0734) BP: (137-149)/(92-114) 145/92 (04/23 0734) SpO2:  [94 %-97 %] 94 % (04/23 0734) Last BM Date : 09/20/23 General: NAD Abdomen: Soft, no distention, + left lower quadrant tenderness     Intake/Output from previous day: 04/22 0701 - 04/23 0700 In: 910.3 [I.V.:610.3; IV Piggyback:300] Out: -  Intake/Output this shift: Total I/O In: 480 [P.O.:480] Out: -    Lab Results: Recent Labs    09/20/23 1826 09/21/23 0640 09/22/23 0715  WBC 15.4* 11.4* 11.5*  HGB 16.3 14.9 14.9  PLT 344 297 307  MCV 86.2 88.9 88.5   BMET Recent Labs    09/20/23 1826 09/21/23 0640 09/22/23 0715  NA 135 135 136  K 3.7 3.5 3.3*  CL 101 100 101  CO2 24 24 20*  GLUCOSE 101* 105* 103*  BUN 17 11 10   CREATININE 1.10 1.19 1.05  CALCIUM  9.9 9.0 8.7*   LFT Recent Labs    09/20/23 1826 09/21/23 0640 09/22/23 0715  PROT 7.9 6.8 6.8  ALBUMIN 5.0 3.8 3.6  AST 16 15 15   ALT 17 17 16   ALKPHOS 73 54 53  BILITOT 1.1 1.9* 1.2   PT/INR No results for input(s): "INR" in the last 72 hours.    Imaging/Other results: CT ABDOMEN PELVIS W CONTRAST Result Date: 09/21/2023 CLINICAL DATA:  Left lower quadrant pain. EXAM: CT ABDOMEN AND PELVIS WITH CONTRAST TECHNIQUE: Multidetector CT imaging of the abdomen and pelvis was performed using the standard protocol following bolus administration of intravenous contrast. RADIATION DOSE REDUCTION: This exam was performed according to the departmental dose-optimization program which includes automated exposure control, adjustment of the mA and/or kV according to patient size and/or use of iterative reconstruction technique.  CONTRAST:  OMNIPAQUE  IOHEXOL  300 MG/ML  SOLN COMPARISON:  August 12, 2023 FINDINGS: Lower chest: No acute abnormality. Hepatobiliary: There is diffuse fatty infiltration of the liver parenchyma. No focal liver abnormality is seen. No gallstones, gallbladder wall thickening, or biliary dilatation. Pancreas: Unremarkable. No pancreatic ductal dilatation or surrounding inflammatory changes. Spleen: Normal in size without focal abnormality. Adrenals/Urinary Tract: Adrenal glands are unremarkable. Kidneys are normal, without obstructing renal calculi, focal lesion, or hydronephrosis. 1 mm and 2 mm nonobstructing renal calculi are seen within the left kidney. Bladder is unremarkable. Stomach/Bowel: Stomach is within normal limits. Appendix appears normal. No evidence of bowel wall thickening, distention, or inflammatory changes. Markedly inflamed diverticula are seen within the mid sigmoid colon. A 2.6 cm x 2.1 cm by 2.1 cm area of low attenuation (approximately 31.7 Hounsfield units) is also seen within this portion of the sigmoid colon. There is no evidence of associated free air. Vascular/Lymphatic: Aortic atherosclerosis. No enlarged abdominal or pelvic lymph nodes. Reproductive: Prostate is unremarkable. Other: No abdominal wall hernia or abnormality. No abdominopelvic ascites. Musculoskeletal: No acute or significant osseous findings. IMPRESSION: 1. Marked severity sigmoid diverticulitis with additional findings that may represent early abscess formation. 2. Small nonobstructing left renal calculi. Electronically Signed   By: Virgle Grime M.D.   On: 09/21/2023 03:42      Assessment &Plan  53 year old male with recurrent acute sigmoid diverticulitis with possible 2.6 X2.1X 2.1 cm evolving abscess Continue IV  antibiotics, may need IV antibiotics for 3 to 4 days followed by oral antibiotics to complete a total 14-day course Leukocytosis and abdominal pain is improving  Continue full liquid diet, if  pain continues to improve will advance to soft diet tomorrow  If his symptoms are improving will plan for repeat CT abdomen pelvis with contrast in 10-14 days as outpatient to document resolution of diverticulitis but if his symptoms do not improve will plan to repeat CT in 48 to 72 hours to evaluate the abscess for possible drainage  Pain control and supportive care GI will continue to follow along  Patient will benefit from outpatient surgery follow-up to consider elective resection to prevent recurrent episode of diverticulitis    [name] , MD [phone]  [facility]

## 2023-09-23 ENCOUNTER — Telehealth: Payer: Self-pay | Admitting: Gastroenterology

## 2023-09-23 DIAGNOSIS — D72829 Elevated white blood cell count, unspecified: Secondary | ICD-10-CM | POA: Diagnosis not present

## 2023-09-23 DIAGNOSIS — K5732 Diverticulitis of large intestine without perforation or abscess without bleeding: Secondary | ICD-10-CM | POA: Diagnosis not present

## 2023-09-23 DIAGNOSIS — K5720 Diverticulitis of large intestine with perforation and abscess without bleeding: Secondary | ICD-10-CM | POA: Diagnosis not present

## 2023-09-23 LAB — BASIC METABOLIC PANEL WITH GFR
Anion gap: 12 (ref 5–15)
BUN: 9 mg/dL (ref 6–20)
CO2: 21 mmol/L — ABNORMAL LOW (ref 22–32)
Calcium: 8.8 mg/dL — ABNORMAL LOW (ref 8.9–10.3)
Chloride: 103 mmol/L (ref 98–111)
Creatinine, Ser: 1.07 mg/dL (ref 0.61–1.24)
GFR, Estimated: >60 mL/min (ref >60)
Glucose, Bld: 91 mg/dL (ref 70–99)
Potassium: 3.4 mmol/L — ABNORMAL LOW (ref 3.5–5.1)
Sodium: 136 mmol/L (ref 135–145)

## 2023-09-23 MED ORDER — POLYVINYL ALCOHOL 1.4 % OP SOLN: 1.0000 [drp] | OPHTHALMIC | Status: DC | PRN

## 2023-09-23 MED ORDER — SODIUM CHLORIDE 0.9 % IV SOLN: INTRAVENOUS | Status: AC

## 2023-09-23 NOTE — Progress Notes (Signed)
 [facility] ROUNDING NOTE AUGUSTEN LIPKIN JWJ:[id number]  DOB: 1971/05/08  DOA: 09/20/2023  PCP: Bettie Bruce, PA-C  09/23/2023,2:36 PM  LOS: 2 days    Code Status: Full code   from: Home current Dispo: Unclear   53 year old black male Previous DVT on Xarelto  in the setting of factor V Leiden HTN HLD migraines BPH reflux Prior admission 10/12/2019 acute descending colon diverticulitis with phlegmon treated medically with antibiotics discharged on Augmentin -also treated again 09/22/2022 conservatively for same issue--last evaluated 12/2022 5 mm polyp transient Trulicity medium to small mouth diverticula left colon additionally-EGD showed inflammation erythema at the time  4/22 sharp abdominal pain LLQ urinary hesitancy dysuria-WBC 15.4 hemoglobin 16 platelet 344 sodium 135 potassium 3.7 BUN/creatinine 17/1.1 LFTs relatively normal lipase less than 10 CT ABD moderate severity sigmoid diverticulitis with additional findings representing possible early abscess formation --2.6 X2.1X 2.1 cm intra-abd abscess small nonobstructing left renal calculi Gastroenterology consulted-recommended that if definite abscess 3 cm or greater then recommend IR drainage  Plan  Severe diverticulitis with nonobstructive, no abscess Previous CT 4/21 showing 2.6 X 2.1 X  2.1 was phlegmonous change-radiology report 4/25 CT  as being 0.9 X 0.7 X 0.9 which has grown to now 1.4 X 1.1 X 1.0-d/w Dr.Poff Radiology who over-read the film and feels that they were primarily phlegmonous changes and the film does not look demonstrably worse We will hold on consulting IR and will resume soft diet and see how the patient does over the next day or so-would keep IV Cipro  Flagyl  for now----defer further imaging as per GI Pain controlled well--- Dilaudid  1 mg every 2 as needed severe pain ---also hydrocodone  7.5 every 6 as needed moderate pain continue saline 75 cc/h   Short segment Barrett's esophagus reflux-is up-to-date on screening Protonix  40  daily --outpatient interval screening  Mild hypokalemia Replace orally with K-Dur 40 X1---correted--stop replacement  Factor V Leiden DVT in lower extremity , recent left upper extremity DVT provoked by IV on Xarelto -- converted to Lovenox  injections given uncertainty of clinical course  Depression continue bupropion  XL 450 daily  HTN moderately controlled Continue Cardizem  240 CD, losartan  50 daily  Migraine --currently stable continue Imitrex  50, may use butalbital  1 every 4 as needed headache    DVT prophylaxis: Changed Xarelto  to Lovenox   Status is: Inpatient Remains inpatient appropriate because:   Requires further in-hospital management observation  Subjective:  Overall fair no distress Left eye seems better no chest pain no fever  Objective + exam Vitals:   09/22/23 2020 09/23/23 0439 09/23/23 0500 09/23/23 0743  BP: (!) 131/99 (!) 131/93  (!) 137/93  Pulse: 80 73  93  Resp:  17  17  Temp: 98.5 F (36.9 C) 98.3 F (36.8 C)  97.7 F (36.5 C)  TempSrc: Oral Oral  Oral  SpO2: 97% 94%  98%  Weight:   87.1 kg   Height:       Filed Weights   09/20/23 1815 09/23/23 0500  Weight: 86.2 kg 87.1 kg    Examination:  EOMI NCAT no focal deficit no icterus no pallor Looks about stated age Chest clear with no wheeze rales rhonchi Abdomen is soft without rebound or guarding much less tender No lower extremity edema  Data Reviewed: reviewed   CBC    Component Value Date/Time   WBC 11.5 (H) 09/22/2023 0715   RBC 4.85 09/22/2023 0715   HGB 14.9 09/22/2023 0715   HGB 14.4 10/27/2022 1022   HGB 14.4 07/23/2017 1201  HGB 14.9 05/24/2017 0845   HCT 42.9 09/22/2023 0715   HCT 42.2 07/23/2017 1201   HCT 42.2 05/24/2017 0845   PLT 307 09/22/2023 0715   PLT 258 10/27/2022 1022   PLT 356 07/23/2017 1201   MCV 88.5 09/22/2023 0715   MCV 90.1 02/08/2018 1608   MCV 85 07/23/2017 1201   MCV 87 05/24/2017 0845   MCH 30.7 09/22/2023 0715   MCHC 34.7 09/22/2023 0715    RDW 12.2 09/22/2023 0715   RDW 13.7 07/23/2017 1201   RDW 13.0 05/24/2017 0845   LYMPHSABS 1.9 09/22/2023 0715   LYMPHSABS 2.9 05/24/2017 0845   MONOABS 1.0 09/22/2023 0715   EOSABS 0.3 09/22/2023 0715   EOSABS 0.2 05/24/2017 0845   BASOSABS 0.1 09/22/2023 0715   BASOSABS 0.1 05/24/2017 0845      Latest Ref Rng & Units 09/23/2023    6:34 AM 09/22/2023    7:15 AM 09/21/2023    6:40 AM  CMP  Glucose 70 - 99 mg/dL 91  161  096   BUN 6 - 20 mg/dL 9  10  11    Creatinine 0.61 - 1.24 mg/dL 0.45  4.09  8.11   Sodium 135 - 145 mmol/L 136  136  135   Potassium 3.5 - 5.1 mmol/L 3.4  3.3  3.5   Chloride 98 - 111 mmol/L 103  101  100   CO2 22 - 32 mmol/L 21  20  24    Calcium  8.9 - 10.3 mg/dL 8.8  8.7  9.0   Total Protein 6.5 - 8.1 g/dL  6.8  6.8   Total Bilirubin 0.0 - 1.2 mg/dL  1.2  1.9   Alkaline Phos 38 - 126 U/L  53  54   AST 15 - 41 U/L  15  15   ALT 0 - 44 U/L  16  17     Scheduled Meds:  buPROPion   450 mg Oral Daily   diltiazem   240 mg Oral Daily   enoxaparin  (LOVENOX ) injection  80 mg Subcutaneous BID   losartan   25 mg Oral Daily   pantoprazole   40 mg Oral Daily   tamsulosin   0.4 mg Oral Daily   valACYclovir   500 mg Oral Daily   Continuous Infusions:  sodium chloride      ciprofloxacin  400 mg (09/23/23 1002)   metronidazole  500 mg (09/23/23 1011)    Time  36  Verlie Glisson, MD  [facility]

## 2023-09-23 NOTE — Progress Notes (Signed)
 [facility] ROUNDING NOTE   Subjective: Abdominal pain is improving, he is tolerating full liquid diet.  Complains of discharge from left eye   Objective: Vital signs in last 24 hours: Temp:  [97.4 F (36.3 C)-98.5 F (36.9 C)] 97.4 F (36.3 C) (04/24 1624) Pulse Rate:  [73-93] 83 (04/24 1624) Resp:  [17-18] 17 (04/24 1624) BP: (118-140)/(89-99) 140/92 (04/24 1624) SpO2:  [94 %-98 %] 97 % (04/24 1624) Weight:  [87.1 kg] 87.1 kg (04/24 0500) Last BM Date : 09/22/23 General: NAD Abdomen: Soft, left lower quadrant tender with mild guarding     Intake/Output from previous day: 04/23 0701 - 04/24 0700 In: 1154.2 [P.O.:720; I.V.:434.2] Out: -  Intake/Output this shift: Total I/O In: 1796.9 [P.O.:480; I.V.:116.9; IV Piggyback:1200] Out: -    Lab Results: Recent Labs    09/20/23 1826 09/21/23 0640 09/22/23 0715  WBC 15.4* 11.4* 11.5*  HGB 16.3 14.9 14.9  PLT 344 297 307  MCV 86.2 88.9 88.5   BMET Recent Labs    09/21/23 0640 09/22/23 0715 09/23/23 0634  NA 135 136 136  K 3.5 3.3* 3.4*  CL 100 101 103  CO2 24 20* 21*  GLUCOSE 105* 103* 91  BUN 11 10 9   CREATININE 1.19 1.05 1.07  CALCIUM  9.0 8.7* 8.8*   LFT Recent Labs    09/20/23 1826 09/21/23 0640 09/22/23 0715  PROT 7.9 6.8 6.8  ALBUMIN 5.0 3.8 3.6  AST 16 15 15   ALT 17 17 16   ALKPHOS 73 54 53  BILITOT 1.1 1.9* 1.2   PT/INR No results for input(s): "INR" in the last 72 hours.    Imaging/Other results: No results found.    Assessment &Plan  53 year old male with recurrent acute sigmoid diverticulitis with possible 2.6 X2.1X 2.1 cm evolving abscess Continue IV antibiotics, may need IV antibiotics for 3 to 4 days followed by oral antibiotics to complete a total 14-day course Abdominal pain is improving, has persistent mild leukocytosis  Advance to soft diet  Will obtain CT abdomen pelvis to evaluate the abscess, if improving he may potentially be able to go home tomorrow  and transition to oral antibiotics but if the abscess is increased in size, may need drainage by IR  GI will continue to follow along    K. Patrick Boor , MD [phone]  [facility]

## 2023-09-23 NOTE — Plan of Care (Signed)

## 2023-09-23 NOTE — Plan of Care (Signed)

## 2023-09-23 NOTE — Telephone Encounter (Signed)
 Patient called with his mother and stated that he is currently at the hospital admitted and they have a couple of questions that need to be answer but whenever they ask to speak to one of the on call doctors or PA they stated that they don't know when they will come by. Patient is very upset and is wanting some one to return his call. Please advise.

## 2023-09-24 ENCOUNTER — Inpatient Hospital Stay (HOSPITAL_COMMUNITY)

## 2023-09-24 DIAGNOSIS — K5720 Diverticulitis of large intestine with perforation and abscess without bleeding: Secondary | ICD-10-CM | POA: Diagnosis not present

## 2023-09-24 LAB — CBC WITH DIFFERENTIAL/PLATELET
Abs Immature Granulocytes: 0.04 10*3/uL (ref 0.00–0.07)
Basophils Absolute: 0.1 10*3/uL (ref 0.0–0.1)
Basophils Relative: 2 %
Eosinophils Absolute: 0.4 10*3/uL (ref 0.0–0.5)
Eosinophils Relative: 6 %
HCT: 39.7 % (ref 39.0–52.0)
Hemoglobin: 13.5 g/dL (ref 13.0–17.0)
Immature Granulocytes: 1 %
Lymphocytes Relative: 31 %
Lymphs Abs: 2.1 10*3/uL (ref 0.7–4.0)
MCH: 30.4 pg (ref 26.0–34.0)
MCHC: 34 g/dL (ref 30.0–36.0)
MCV: 89.4 fL (ref 80.0–100.0)
Monocytes Absolute: 0.7 10*3/uL (ref 0.1–1.0)
Monocytes Relative: 10 %
Neutro Abs: 3.5 10*3/uL (ref 1.7–7.7)
Neutrophils Relative %: 50 %
Platelets: 316 10*3/uL (ref 150–400)
RBC: 4.44 MIL/uL (ref 4.22–5.81)
RDW: 12.2 % (ref 11.5–15.5)
WBC: 6.8 10*3/uL (ref 4.0–10.5)
nRBC: 0 % (ref 0.0–0.2)

## 2023-09-24 LAB — BASIC METABOLIC PANEL WITH GFR
Anion gap: 11 (ref 5–15)
BUN: 10 mg/dL (ref 6–20)
CO2: 23 mmol/L (ref 22–32)
Calcium: 9 mg/dL (ref 8.9–10.3)
Chloride: 102 mmol/L (ref 98–111)
Creatinine, Ser: 1.23 mg/dL (ref 0.61–1.24)
GFR, Estimated: >60 mL/min (ref >60)
Glucose, Bld: 86 mg/dL (ref 70–99)
Potassium: 3.8 mmol/L (ref 3.5–5.1)
Sodium: 136 mmol/L (ref 135–145)

## 2023-09-24 MED ORDER — POLYETHYLENE GLYCOL 3350 17 G PO PACK
17.0000 g | PACK | Freq: Every day | ORAL | Status: DC
  Administered 2023-09-24 – 2023-09-25 (×2): 17 g via ORAL
  Filled 2023-09-24 (×2): qty 1

## 2023-09-24 MED ORDER — IOHEXOL 350 MG/ML SOLN
75.0000 mL | Freq: Once | INTRAVENOUS | Status: AC | PRN
  Administered 2023-09-24: 75 mL via INTRAVENOUS

## 2023-09-24 NOTE — Plan of Care (Signed)

## 2023-09-24 NOTE — Plan of Care (Signed)

## 2023-09-24 NOTE — Progress Notes (Signed)
 Patient ID: Benjamin [name], male   DOB: 07/29/1970, 53 y.o.   MRN: [id number]    Progress Note   Subjective   Day # 4 CC: acute diverticulitis  IV Zosyn > IV Cipro /IV metronidazole   Labs today WBC down to 6.8/hemoglobin 13.5/hematocrit 39.7 Potassium 3.8/BUN 10/creatinine 1.23  CT abdomen and pelvis; no dilated loops of large or small bowel again changes consistent with acute sigmoid diverticulitis with persistent wall thickening and surrounding soft tissue stranding, there is a small serosal abscess measuring 1.4 x 1.1 x 1 cm less well-defined on the prior CT, no drainable fluid collection, there is mild wall thickening overlying soft tissue stranding along the dome of the bladder likely reflecting secondary inflammation from adjacent diverticulitis.  Patient says he is feeling better, pain is starting to improve he still having some pressure with urination but says it does not necessarily hurt to urinate, just slow.  He has not had a bowel movement over the past couple of days.  He has been able to tolerate the liquids without difficulty and has been up and ambulating. Family frustrated with his hospitalization, says he is not able to sleep, CT done at 2 AM    Objective   Vital signs in last 24 hours: Temp:  [97.4 F (36.3 C)-98.7 F (37.1 C)] 98.6 F (37 C) (04/25 0827) Pulse Rate:  [79-95] 90 (04/25 0827) Resp:  [17-18] 18 (04/25 0827) BP: (140-150)/(92-126) 145/101 (04/25 0827) SpO2:  [97 %-100 %] 97 % (04/25 0827) Weight:  [86.5 kg] 86.5 kg (04/25 0500) Last BM Date : 09/22/23 General:    White male in NAD Heart:  Regular rate and rhythm; no murmurs Lungs: Respirations even and unlabored, lungs CTA bilaterally Abdomen:  Soft, mains tender in the left lower quadrant but less so than when last examined on 09/22/2023, no guarding or rebound. Normal bowel sounds. Extremities:  Without edema. Neurologic:  Alert and oriented,  grossly normal neurologically. Psych:  Cooperative.  Normal mood and affect.  Intake/Output from previous day: 04/24 0701 - 04/25 0700 In: 1916.9 [P.O.:600; I.V.:116.9; IV Piggyback:1200] Out: -  Intake/Output this shift: Total I/O In: 240 [P.O.:240] Out: -   Lab Results: Recent Labs    09/22/23 0715 09/24/23 0504  WBC 11.5* 6.8  HGB 14.9 13.5  HCT 42.9 39.7  PLT 307 316   BMET Recent Labs    09/22/23 0715 09/23/23 0634 09/24/23 0504  NA 136 136 136  K 3.3* 3.4* 3.8  CL 101 103 102  CO2 20* 21* 23  GLUCOSE 103* 91 86  BUN 10 9 10   CREATININE 1.05 1.07 1.23  CALCIUM  8.7* 8.8* 9.0   LFT Recent Labs    09/22/23 0715  PROT 6.8  ALBUMIN 3.6  AST 15  ALT 16  ALKPHOS 53  BILITOT 1.2   PT/INR No results for input(s): "LABPROT", "INR" in the last 72 hours.  Studies/Results: CT ABDOMEN PELVIS W CONTRAST Result Date: 09/24/2023 CLINICAL DATA:  Diverticulitis.  Rule out abscess. EXAM: CT ABDOMEN AND PELVIS WITH CONTRAST TECHNIQUE: Multidetector CT imaging of the abdomen and pelvis was performed using the standard protocol following bolus administration of intravenous contrast. RADIATION DOSE REDUCTION: This exam was performed according to the departmental dose-optimization program which includes automated exposure control, adjustment of the mA and/or kV according to patient size and/or use of iterative reconstruction technique. CONTRAST:  75mL OMNIPAQUE  IOHEXOL  350 MG/ML SOLN COMPARISON:  09/20/2023 FINDINGS: Lower chest: No acute abnormality. Hepatobiliary: No focal liver abnormality is seen. No  gallstones, gallbladder wall thickening, or biliary dilatation. Pancreas: Unremarkable. No pancreatic ductal dilatation or surrounding inflammatory changes. Spleen: Normal in size without focal abnormality. Adrenals/Urinary Tract: Normal adrenal glands. Small nonobstructing stone within the left mid kidney measures 3 mm. No right kidney stones. No mass or hydronephrosis. No focal bladder abnormality. Mild wall thickening with overlying  soft tissue stranding along the dome of bladder likely reflects secondary inflammation from adjacent diverticulitis. Stomach/Bowel: Stomach is normal. No dilated loops of large or small bowel to suggest an obstruction. The appendix is visualized and appears normal. Again seen are changes consistent with acute sigmoid diverticulitis with persistent wall thickening and surrounding soft tissue stranding. Serosal abscess measures 1.4 x 1.1 by 1.0 cm, image 67/3 and 51/6. On the previous exam this was left less well-defined measuring 0.9 x 0.7 by 0.9 cm. Vascular/Lymphatic: Aortic atherosclerosis without aneurysm. Patent abdominal vascularity. No abdominopelvic adenopathy. Reproductive: Prostate is unremarkable. Other: No significant free fluid. No drainable fluid collections identified. No signs of pneumoperitoneum. Small fat containing umbilical hernia. Musculoskeletal: No acute or significant osseous findings. IMPRESSION: 1. Again seen are changes consistent with acute sigmoid diverticulitis with persistent wall thickening and surrounding soft tissue stranding. Serosal abscess measures 1.4 x 1.1 by 1.0 cm. On the previous exam this was less well-defined measuring 0.9 x 0.7 by 0.9 cm. No large drainable fluid collections identified at this time. 2. Mild wall thickening with overlying soft tissue stranding along the dome of bladder likely reflects secondary inflammation from adjacent diverticulitis. 3. Nonobstructing left renal stone. 4.  Aortic Atherosclerosis (ICD10-I70.0). Electronically Signed   By: Kimberley Penman M.D.   On: 09/24/2023 08:58       Assessment / Plan:    #35 53 year old white male with history of recurrent diverticulitis, this is the third episode requiring hospitalization. Imaging shows significant sigmoid diverticulitis, and on admission there was a 2.6 x 2.1 x 2.1 cm area of low-attenuation within this portion of the sigmoid colon but no associated free air and no definite abscess  He  continues on IV antibiotics, currently on Cipro  and metronidazole .  Repeat imaging early today shows persistent acute sigmoid diverticulitis, persistent wall thickening and surrounding soft tissue stranding and a serosal abscess measuring 1.4 x 1.0 x 1.4 cm.  No drainable fluid collections, there is also some mild wall thickening and soft tissue stranding along the dome of the bladder reflecting secondary inflammation.  Plan; soft diet Continue IV Cipro  and IV metronidazole  If patient is stable and continuing to improve tomorrow he can be discharged to home with Cipro  500 mg twice daily and metronidazole  500 mg twice daily would send prescription for 2 weeks.  We will arrange for outpatient follow-up in the office, Dr. Leonia Raman, and will need follow-up CT to confirm resolution in 2 weeks.   Principal Problem:   Diverticulitis of large intestine with abscess Active Problems:   Urinary tract infection     LOS: 3 days   Keyauna Graefe EsterwoodPA-C  09/24/2023, 1:27 PM

## 2023-09-24 NOTE — Progress Notes (Signed)
 4/24 note was overwritten erropnesouly---re-formated   [facility] ROUNDING NOTE MACKEY VARRICCHIO WJX:[id number]  DOB: 05/14/1971  DOA: 09/20/2023  PCP: Bettie Bruce, PA-C  09/23/2023,2:36 PM  LOS: 2 days    Code Status: Full code   from: Home    current Dispo: Unclear              53 year old black male Previous DVT on Xarelto  in the setting of factor V Leiden HTN HLD migraines BPH reflux Prior admission 10/12/2019 acute descending colon diverticulitis with phlegmon treated medically with antibiotics discharged on Augmentin -also treated again 09/22/2022 conservatively for same issue--last evaluated 12/2022 5 mm polyp transient Trulicity medium to small mouth diverticula left colon additionally-EGD showed inflammation erythema at the time   4/22 sharp abdominal pain LLQ urinary hesitancy dysuria-WBC 15.4 hemoglobin 16 platelet 344 sodium 135 potassium 3.7 BUN/creatinine 17/1.1 LFTs relatively normal lipase less than 10 CT ABD moderate severity sigmoid diverticulitis with additional findings representing possible early abscess formation --2.6 X2.1X 2.1 cm intra-abd abscess small nonobstructing left renal calculi Gastroenterology consulted-recommended that if definite abscess 3 cm or greater then recommend IR drainage   Plan   2.6 X2.1X 2.1 cm nonobstructive intra-abdominal abscess in the sigmoid colon Full liquid diet per GI-saline 75 cc/H-continue Cipro  400 every 12 Flagyl  500 every 12--WBC stable currently Pain controlled well--- Dilaudid  1 mg every 2 as needed severe pain add hydrocodone  7.5 every 6 as needed moderate pain Likely de-escalate antibiotics a.m. and decide on disposition planning in 24 to 48 hours Would continue saline 75 cc/8 and discontinue in a.m.   Short segment Barrett's esophagus reflux-is up-to-date on screening Protonix  40 daily --outpatient interval screening   Mild hypokalemia Replace orally with K-Dur 40 X1-repeat labs a.m.   Factor V Leiden DVT in lower extremity , recent  left upper extremity DVT provoked by IV on Xarelto -- converted to Lovenox  injections given uncertainty of clinical course   Depression continue bupropion  XL 450 daily   HTN moderately controlled Continue Cardizem  240 CD, losartan  50 daily   Migraine continue Imitrex  50, may use butalbital  1 every 4 as needed headache       DVT prophylaxis: Changed Xarelto  to Lovenox    Status is: Inpatient Remains inpatient appropriate because:    Requires further in-hospital management observation   Subjective:   Overall improved no stool today no nausea no vomiting no chest pain no fever Tolerating liquids   Objective + exam       Vitals:    09/22/23 2020 09/23/23 0439 09/23/23 0500 09/23/23 0743  BP: (!) 131/99 (!) 131/93   (!) 137/93  Pulse: 80 73   93  Resp:   17   17  Temp: 98.5 F (36.9 C) 98.3 F (36.8 C)   97.7 F (36.5 C)  TempSrc: Oral Oral   Oral  SpO2: 97% 94%   98%  Weight:     87.1 kg    Height:                Filed Weights    09/20/23 1815 09/23/23 0500  Weight: 86.2 kg 87.1 kg      Examination:   EOMI NCAT no focal deficit no icterus no pallor Looks about stated age Chest clear with no wheeze rales rhonchi Abdomen soft no rebound no guarding and much less tender than prior exam No lower extremity edema Moving 4 limbs equally   Data Reviewed: reviewed    CBC Labs (Brief)  Component Value Date/Time    WBC 11.5 (H) 09/22/2023 0715    RBC 4.85 09/22/2023 0715    HGB 14.9 09/22/2023 0715    HGB 14.4 10/27/2022 1022    HGB 14.4 07/23/2017 1201    HGB 14.9 05/24/2017 0845    HCT 42.9 09/22/2023 0715    HCT 42.2 07/23/2017 1201    HCT 42.2 05/24/2017 0845    PLT 307 09/22/2023 0715    PLT 258 10/27/2022 1022    PLT 356 07/23/2017 1201    MCV 88.5 09/22/2023 0715    MCV 90.1 02/08/2018 1608    MCV 85 07/23/2017 1201    MCV 87 05/24/2017 0845    MCH 30.7 09/22/2023 0715    MCHC 34.7 09/22/2023 0715    RDW 12.2 09/22/2023 0715    RDW 13.7  07/23/2017 1201    RDW 13.0 05/24/2017 0845    LYMPHSABS 1.9 09/22/2023 0715    LYMPHSABS 2.9 05/24/2017 0845    MONOABS 1.0 09/22/2023 0715    EOSABS 0.3 09/22/2023 0715    EOSABS 0.2 05/24/2017 0845    BASOSABS 0.1 09/22/2023 0715    BASOSABS 0.1 05/24/2017 0845          Latest Ref Rng & Units 09/23/2023    6:34 AM 09/22/2023    7:15 AM 09/21/2023    6:40 AM  CMP  Glucose 70 - 99 mg/dL 91  846  962   BUN 6 - 20 mg/dL 9  10  11    Creatinine 0.61 - 1.24 mg/dL 9.52  8.41  3.24   Sodium 135 - 145 mmol/L 136  136  135   Potassium 3.5 - 5.1 mmol/L 3.4  3.3  3.5   Chloride 98 - 111 mmol/L 103  101  100   CO2 22 - 32 mmol/L 21  20  24    Calcium  8.9 - 10.3 mg/dL 8.8  8.7  9.0   Total Protein 6.5 - 8.1 g/dL   6.8  6.8   Total Bilirubin 0.0 - 1.2 mg/dL   1.2  1.9   Alkaline Phos 38 - 126 U/L   53  54   AST 15 - 41 U/L   15  15   ALT 0 - 44 U/L   16  17       Scheduled Meds:  buPROPion   450 mg Oral Daily   diltiazem   240 mg Oral Daily   enoxaparin  (LOVENOX ) injection  80 mg Subcutaneous BID   losartan   25 mg Oral Daily   pantoprazole   40 mg Oral Daily   tamsulosin   0.4 mg Oral Daily   valACYclovir   500 mg Oral Daily        Continuous Infusions:  sodium chloride     ciprofloxacin  400 mg (09/23/23 1002)   metronidazole  500 mg (09/23/23 1011)          Time  36   Verlie Glisson, MD  [facility]

## 2023-09-25 DIAGNOSIS — K5720 Diverticulitis of large intestine with perforation and abscess without bleeding: Secondary | ICD-10-CM | POA: Diagnosis not present

## 2023-09-25 LAB — CBC WITH DIFFERENTIAL/PLATELET
Abs Immature Granulocytes: 0.03 10*3/uL (ref 0.00–0.07)
Basophils Absolute: 0.1 10*3/uL (ref 0.0–0.1)
Basophils Relative: 2 %
Eosinophils Absolute: 0.5 10*3/uL (ref 0.0–0.5)
Eosinophils Relative: 7 %
HCT: 42.3 % (ref 39.0–52.0)
Hemoglobin: 14.6 g/dL (ref 13.0–17.0)
Immature Granulocytes: 1 %
Lymphocytes Relative: 33 %
Lymphs Abs: 2 10*3/uL (ref 0.7–4.0)
MCH: 30.4 pg (ref 26.0–34.0)
MCHC: 34.5 g/dL (ref 30.0–36.0)
MCV: 87.9 fL (ref 80.0–100.0)
Monocytes Absolute: 0.6 10*3/uL (ref 0.1–1.0)
Monocytes Relative: 10 %
Neutro Abs: 2.8 10*3/uL (ref 1.7–7.7)
Neutrophils Relative %: 47 %
Platelets: 359 10*3/uL (ref 150–400)
RBC: 4.81 MIL/uL (ref 4.22–5.81)
RDW: 12 % (ref 11.5–15.5)
WBC: 6.1 10*3/uL (ref 4.0–10.5)
nRBC: 0 % (ref 0.0–0.2)

## 2023-09-25 LAB — BASIC METABOLIC PANEL WITH GFR
Anion gap: 9 (ref 5–15)
BUN: 11 mg/dL (ref 6–20)
CO2: 23 mmol/L (ref 22–32)
Calcium: 9.1 mg/dL (ref 8.9–10.3)
Chloride: 106 mmol/L (ref 98–111)
Creatinine, Ser: 1.12 mg/dL (ref 0.61–1.24)
GFR, Estimated: >60 mL/min (ref >60)
Glucose, Bld: 90 mg/dL (ref 70–99)
Potassium: 4 mmol/L (ref 3.5–5.1)
Sodium: 138 mmol/L (ref 135–145)

## 2023-09-25 MED ORDER — POLYVINYL ALCOHOL 1.4 % OP SOLN: 1.0000 [drp] | OPHTHALMIC | 0 refills | Status: DC | PRN

## 2023-09-25 MED ORDER — BUTALBITAL-APAP-CAFFEINE 50-325-40 MG PO TABS: 1.0000 | ORAL_TABLET | ORAL | 0 refills | Status: AC | PRN

## 2023-09-25 MED ORDER — CIPROFLOXACIN HCL 500 MG PO TABS: 500.0000 mg | ORAL_TABLET | Freq: Two times a day (BID) | ORAL | 0 refills | Status: AC

## 2023-09-25 MED ORDER — HYDROCODONE-ACETAMINOPHEN 5-325 MG PO TABS: 1.0000 | ORAL_TABLET | Freq: Four times a day (QID) | ORAL | 0 refills | Status: DC | PRN

## 2023-09-25 MED ORDER — METRONIDAZOLE 500 MG PO TABS: 500.0000 mg | ORAL_TABLET | Freq: Two times a day (BID) | ORAL | 0 refills | Status: AC

## 2023-09-25 NOTE — Discharge Summary (Signed)
 Physician Discharge Summary  Benjamin [name]:[id number] DOB: 25-Oct-1970 DOA: 09/20/2023  PCP: Bettie Bruce, PA-C  Admit date: 09/20/2023 Discharge date: 09/25/2023  Time spent: 46 minutes  Recommendations for Outpatient Follow-up:  Needs Chem-7 CBC 1 week Outpatient follow-up gastroenterology CC Dr. [name] for outpatient imaging/follow-up Discussed with PCP Fioricet /fluconazole  as long-term not good meds to take chronically Routine preventive care as outpatient  Discharge Diagnoses:  MAIN problem for hospitalization   Diverticulitis with small 1 cm abscess  Please see below for itemized issues addressed in HOpsital- refer to other progress notes for clarity if needed  Discharge Condition: Improved  Diet recommendation: Heart healthy  Filed Weights   09/20/23 1815 09/23/23 0500 09/24/23 0500  Weight: 86.2 kg 87.1 kg 86.5 kg    History of present illness:  53 year old black male Previous DVT on Xarelto  in the setting of factor V Leiden HTN HLD migraines BPH reflux Prior admission 10/12/2019 acute descending colon diverticulitis with phlegmon treated medically with antibiotics discharged on Augmentin -also treated again 09/22/2022 conservatively for same issue--last evaluated 12/2022 5 mm polyp transient Trulicity medium to small mouth diverticula left colon additionally-EGD showed inflammation erythema at the time   4/22 sharp abdominal pain LLQ urinary hesitancy dysuria-WBC 15.4 hemoglobin 16 platelet 344 sodium 135 potassium 3.7 BUN/creatinine 17/1.1 LFTs relatively normal lipase less than 10 CT ABD moderate severity sigmoid diverticulitis with additional findings representing possible early abscess formation --2.6 X2.1X 2.1 cm intra-abd abscess small nonobstructing left renal calculi Gastroenterology consulted-recommended that if definite abscess 3 cm or greater then recommend IR drainage   Plan   Severe diverticulitis with nonobstructive, no abscess Multiple CTs  performed-H initial 1 showing phlegmon 2 cm-repeat ones showed relative stability in size of about 1 cm although they were probably done too close together to make any determination of progression versus improvement-gastroenterology saw the patient and felt patient was stable to transition to oral meds for 14 more days prescribe Cipro  500 twice daily Flagyl  500 twice daily at discharge Pain controlled on Norco 5 every 6 as needed at discharge and we should be able to get outpatient follow-up with repeat CT in about 2 weeks at GI office Will need interval referral to general surgery for probable consideration colectomy to be scheduled per PCP/GI  Short segment Barrett's esophagus reflux-is up-to-date on screening Protonix  40 daily --outpatient interval screening   Mild hypokalemia Replaced during hospital stay and improved   Factor V Leiden DVT in lower extremity , recent left upper extremity DVT provoked by IV on Xarelto --was placed on Lovenox  because of uncertainty of if interventions would be needed such as drain-he ultimately did not need this and was placed back on home dose Xarelto   Tinea cruris-has been on outpatient fluconazole  150 weekly-I have counseled patient to ensure he has labs LFTs done  Depression continue bupropion  XL 450 daily   HTN moderately controlled Continue Cardizem  240 CD, losartan  50 daily   Migraine --currently stable Does not take Imitrex  I have prescribed small amount of butalbital  he will need outpatient follow-up for this  Discharge Exam: Vitals:   09/24/23 0827 09/24/23 1354  BP: (!) 145/101 (!) 119/92  Pulse: 90 80  Resp: 18 18  Temp: 98.6 F (37 C) 98 F (36.7 C)  SpO2: 97% 97%    Subj on day of d/c   Well coherent no distress looks fair no fever no abdominal pain  General Exam on discharge  EOMI NCAT no focal deficit CTAB no added sound no wheeze rales  rhonchi ROM intact no deficit Abdomen is soft no rebound no guarding No lower extremity  edema S1-S2 no murmur   Discharge Instructions   Discharge Instructions     Diet - low sodium heart healthy   Complete by: As directed    Discharge instructions   Complete by: As directed    This hospital stay you were diagnosed with diverticulitis and a small minuscule abscess of about 1 cm confirmed after discussion with radiology You will need to complete 14 days more of Cipro  Flagyl  and we will prescribe a small amount of pain meds You can take a probiotic or eat fermented foods to help with side effects of this Resume all your other meds without change Discussed with your physician long-term use of fluconazole  for your tinea cruris Take your blood thinner as per regular dosing-butalbital /Fioricet  is not a good long-term option for migraines so this may need to be discussed High-grade fevers bleeding severe abdominal pain are indications to come back to the emergency room Best of luck   Increase activity slowly   Complete by: As directed       Allergies as of 09/25/2023       Reactions   Bismuth Subsalicylate Swelling, Other (See Comments)   Compazine  [prochlorperazine  Edisylate] Other (See Comments)   Cervical dystonia and panic attack        Medication List     STOP taking these medications    dicyclomine  10 MG capsule Commonly known as: BENTYL    polyethylene glycol 17 g packet Commonly known as: MIRALAX  / GLYCOLAX    SUMAtriptan  50 MG tablet Commonly known as: Imitrex        TAKE these medications    acetaminophen  500 MG tablet Commonly known as: TYLENOL  Take 500-1,000 mg by mouth every 6 (six) hours as needed for moderate pain (pain score 4-6).   buPROPion  150 MG 24 hr tablet Commonly known as: WELLBUTRIN  XL Take 450 mg by mouth daily.   butalbital -acetaminophen -caffeine  50-325-40 MG tablet Commonly known as: FIORICET  Take 1 tablet by mouth every 4 (four) hours as needed for headache.   ciprofloxacin  500 MG tablet Commonly known as: Cipro  Take  1 tablet (500 mg total) by mouth 2 (two) times daily for 14 days.   diltiazem  240 MG 24 hr capsule Commonly known as: CARDIZEM  CD TAKE 1 CAPSULE BY MOUTH EVERY DAY   esomeprazole  20 MG capsule Commonly known as: NEXIUM  Take 40 mg by mouth daily at 12 noon.   fluconazole  150 MG tablet Commonly known as: DIFLUCAN  Take 150 mg by mouth once a week.   HYDROcodone -acetaminophen  5-325 MG tablet Commonly known as: NORCO/VICODIN Take 1 tablet by mouth every 6 (six) hours as needed.   losartan  25 MG tablet Commonly known as: COZAAR  Take 2 tablets (50 mg total) by mouth daily. What changed: how much to take   metroNIDAZOLE  500 MG tablet Commonly known as: Flagyl  Take 1 tablet (500 mg total) by mouth 2 (two) times daily for 14 days.   ondansetron  4 MG disintegrating tablet Commonly known as: ZOFRAN -ODT Take 1 tablet (4 mg total) by mouth every 8 (eight) hours as needed for nausea or vomiting.   polyvinyl alcohol  1.4 % ophthalmic solution Commonly known as: LIQUIFILM TEARS Place 1 drop into the left eye as needed for dry eyes.   rivaroxaban  20 MG Tabs tablet Commonly known as: XARELTO  Take 1 tablet (20 mg total) by mouth daily with supper.   rosuvastatin  40 MG tablet Commonly known as: CRESTOR  Take 1  tablet (40 mg total) by mouth once a week.   tamsulosin  0.4 MG Caps capsule Commonly known as: FLOMAX  Take 1 capsule (0.4 mg total) by mouth daily.   tiZANidine  4 MG tablet Commonly known as: ZANAFLEX  Take 4 mg by mouth every 8 (eight) hours as needed for muscle spasms.   valACYclovir  500 MG tablet Commonly known as: VALTREX  Take 1 tablet (500 mg total) by mouth daily.       Allergies  Allergen Reactions   Bismuth Subsalicylate Swelling and Other (See Comments)   Compazine  [Prochlorperazine  Edisylate] Other (See Comments)    Cervical dystonia and panic attack    Follow-up Information     Noland Battles L, PA-C Follow up.   Specialty: Urgent Care Contact  information: [address] [place] [address] [phone]                  The results of significant diagnostics from this hospitalization (including imaging, microbiology, ancillary and laboratory) are listed below for reference.    Significant Diagnostic Studies: CT ABDOMEN PELVIS W CONTRAST Result Date: 09/24/2023 CLINICAL DATA:  Diverticulitis.  Rule out abscess. EXAM: CT ABDOMEN AND PELVIS WITH CONTRAST TECHNIQUE: Multidetector CT imaging of the abdomen and pelvis was performed using the standard protocol following bolus administration of intravenous contrast. RADIATION DOSE REDUCTION: This exam was performed according to the departmental dose-optimization program which includes automated exposure control, adjustment of the mA and/or kV according to patient size and/or use of iterative reconstruction technique. CONTRAST:  75mL OMNIPAQUE  IOHEXOL  350 MG/ML SOLN COMPARISON:  09/20/2023 FINDINGS: Lower chest: No acute abnormality. Hepatobiliary: No focal liver abnormality is seen. No gallstones, gallbladder wall thickening, or biliary dilatation. Pancreas: Unremarkable. No pancreatic ductal dilatation or surrounding inflammatory changes. Spleen: Normal in size without focal abnormality. Adrenals/Urinary Tract: Normal adrenal glands. Small nonobstructing stone within the left mid kidney measures 3 mm. No right kidney stones. No mass or hydronephrosis. No focal bladder abnormality. Mild wall thickening with overlying soft tissue stranding along the dome of bladder likely reflects secondary inflammation from adjacent diverticulitis. Stomach/Bowel: Stomach is normal. No dilated loops of large or small bowel to suggest an obstruction. The appendix is visualized and appears normal. Again seen are changes consistent with acute sigmoid diverticulitis with persistent wall thickening and surrounding soft tissue stranding. Serosal abscess measures 1.4 x 1.1 by 1.0 cm, image 67/3 and  51/6. On the previous exam this was left less well-defined measuring 0.9 x 0.7 by 0.9 cm. Vascular/Lymphatic: Aortic atherosclerosis without aneurysm. Patent abdominal vascularity. No abdominopelvic adenopathy. Reproductive: Prostate is unremarkable. Other: No significant free fluid. No drainable fluid collections identified. No signs of pneumoperitoneum. Small fat containing umbilical hernia. Musculoskeletal: No acute or significant osseous findings. IMPRESSION: 1. Again seen are changes consistent with acute sigmoid diverticulitis with persistent wall thickening and surrounding soft tissue stranding. Serosal abscess measures 1.4 x 1.1 by 1.0 cm. On the previous exam this was less well-defined measuring 0.9 x 0.7 by 0.9 cm. No large drainable fluid collections identified at this time. 2. Mild wall thickening with overlying soft tissue stranding along the dome of bladder likely reflects secondary inflammation from adjacent diverticulitis. 3. Nonobstructing left renal stone. 4.  Aortic Atherosclerosis (ICD10-I70.0). Electronically Signed   By: Kimberley Penman M.D.   On: 09/24/2023 08:58   CT ABDOMEN PELVIS W CONTRAST Result Date: 09/21/2023 CLINICAL DATA:  Left lower quadrant pain. EXAM: CT ABDOMEN AND PELVIS WITH CONTRAST TECHNIQUE: Multidetector CT imaging of the abdomen and pelvis  was performed using the standard protocol following bolus administration of intravenous contrast. RADIATION DOSE REDUCTION: This exam was performed according to the departmental dose-optimization program which includes automated exposure control, adjustment of the mA and/or kV according to patient size and/or use of iterative reconstruction technique. CONTRAST:  OMNIPAQUE  IOHEXOL  300 MG/ML  SOLN COMPARISON:  August 12, 2023 FINDINGS: Lower chest: No acute abnormality. Hepatobiliary: There is diffuse fatty infiltration of the liver parenchyma. No focal liver abnormality is seen. No gallstones, gallbladder wall thickening, or  biliary dilatation. Pancreas: Unremarkable. No pancreatic ductal dilatation or surrounding inflammatory changes. Spleen: Normal in size without focal abnormality. Adrenals/Urinary Tract: Adrenal glands are unremarkable. Kidneys are normal, without obstructing renal calculi, focal lesion, or hydronephrosis. 1 mm and 2 mm nonobstructing renal calculi are seen within the left kidney. Bladder is unremarkable. Stomach/Bowel: Stomach is within normal limits. Appendix appears normal. No evidence of bowel wall thickening, distention, or inflammatory changes. Markedly inflamed diverticula are seen within the mid sigmoid colon. A 2.6 cm x 2.1 cm by 2.1 cm area of low attenuation (approximately 31.7 Hounsfield units) is also seen within this portion of the sigmoid colon. There is no evidence of associated free air. Vascular/Lymphatic: Aortic atherosclerosis. No enlarged abdominal or pelvic lymph nodes. Reproductive: Prostate is unremarkable. Other: No abdominal wall hernia or abnormality. No abdominopelvic ascites. Musculoskeletal: No acute or significant osseous findings. IMPRESSION: 1. Marked severity sigmoid diverticulitis with additional findings that may represent early abscess formation. 2. Small nonobstructing left renal calculi. Electronically Signed   By: Virgle Grime M.D.   On: 09/21/2023 03:42    Microbiology: Recent Results (from the past 240 hours)  Urine Culture (for pregnant, neutropenic or urologic patients or patients with an indwelling urinary catheter)     Status: None   Collection Time: 09/21/23 10:59 AM   Specimen: Urine, Clean Catch  Result Value Ref Range Status   Specimen Description URINE, CLEAN CATCH  Final   Special Requests NONE  Final   Culture   Final    NO GROWTH Performed at [facility], [address], [place], [place] [address]    Report Status 09/22/2023 FINAL  Final     Labs: Basic Metabolic Panel: Recent Labs  Lab 09/21/23 0640 09/22/23 0715 09/23/23 0634  09/24/23 0504 09/25/23 0657  NA 135 136 136 136 138  K 3.5 3.3* 3.4* 3.8 4.0  CL 100 101 103 102 106  CO2 24 20* 21* 23 23  GLUCOSE 105* 103* 91 86 90  BUN 11 10 9 10 11   CREATININE 1.19 1.05 1.07 1.23 1.12  CALCIUM  9.0 8.7* 8.8* 9.0 9.1  MG 1.8 1.9  --   --   --   PHOS  --  3.7  --   --   --    Liver Function Tests: Recent Labs  Lab 09/20/23 1826 09/21/23 0640 09/22/23 0715  AST 16 15 15   ALT 17 17 16   ALKPHOS 73 54 53  BILITOT 1.1 1.9* 1.2  PROT 7.9 6.8 6.8  ALBUMIN 5.0 3.8 3.6   Recent Labs  Lab 09/20/23 1826  LIPASE <10*   No results for input(s): "AMMONIA" in the last 168 hours. CBC: Recent Labs  Lab 09/20/23 1826 09/21/23 0640 09/22/23 0715 09/24/23 0504 09/25/23 0657  WBC 15.4* 11.4* 11.5* 6.8 6.1  NEUTROABS  --  7.9* 8.2* 3.5 2.8  HGB 16.3 14.9 14.9 13.5 14.6  HCT 45.6 43.3 42.9 39.7 42.3  MCV 86.2 88.9 88.5 89.4 87.9  PLT 344 297 307 316 359   Cardiac Enzymes: No results for input(s): "CKTOTAL", "CKMB", "CKMBINDEX", "TROPONINI" in the last 168 hours. BNP: BNP (last 3 results) No results for input(s): "BNP" in the last 8760 hours.  ProBNP (last 3 results) No results for input(s): "PROBNP" in the last 8760 hours.  CBG: No results for input(s): "GLUCAP" in the last 168 hours.  Signed:  Verlie Glisson MD   [facility] 09/25/2023, 8:13 AM

## 2023-09-27 ENCOUNTER — Telehealth (HOSPITAL_COMMUNITY): Payer: Self-pay | Admitting: Pharmacy Technician

## 2023-09-27 ENCOUNTER — Other Ambulatory Visit: Payer: Self-pay

## 2023-09-27 DIAGNOSIS — K5792 Diverticulitis of intestine, part unspecified, without perforation or abscess without bleeding: Secondary | ICD-10-CM

## 2023-09-27 DIAGNOSIS — K5720 Diverticulitis of large intestine with perforation and abscess without bleeding: Secondary | ICD-10-CM

## 2023-09-27 NOTE — Telephone Encounter (Signed)
 Pharmacy Patient Advocate Encounter  Received notification from [facility] that Prior Authorization for HYDROcodone -Acetaminophen  5-325MG  tablets has been APPROVED from 09/27/2023 to 10/27/2023   PA #/Case ID/Reference #: [id number]  Key: [id number]

## 2023-09-30 ENCOUNTER — Other Ambulatory Visit: Payer: Self-pay

## 2023-10-05 ENCOUNTER — Telehealth: Payer: Self-pay

## 2023-10-05 NOTE — Telephone Encounter (Signed)
 Esterwood, Amy S, PA-C  [name], Kavitha V, MD; Wylder Macomber, Marjory Signs, LPN Veena,-we saw this patient with acute diverticulitis, sigmoid, follow-up CT today showed persistent sigmoid diverticulitis, he now has a more well-defined abscess but only 1.0 x 1.4 cm. He is continuing to improve  Likely will be discharged home tomorrow on oral Cipro  and Flagyl  x 2 weeks He will need a repeat CT in about 2 weeks  Thank you

## 2023-10-05 NOTE — Telephone Encounter (Signed)
 Appointment for follow up with Dr [name] scheduled for 10/21/23. Message left on patient telephone. Message sent through My Chart.

## 2023-10-13 NOTE — Telephone Encounter (Signed)
 Patient responded through My Chart and accepted the appointment. CT is scheduled for 10/19/23.

## 2023-10-19 ENCOUNTER — Ambulatory Visit (HOSPITAL_COMMUNITY)
Admission: RE | Admit: 2023-10-19 | Discharge: 2023-10-19 | Disposition: A | Discharge status: Home / Self Care | Source: Ambulatory Visit | Attending: Gastroenterology | Admitting: Gastroenterology

## 2023-10-19 DIAGNOSIS — K5720 Diverticulitis of large intestine with perforation and abscess without bleeding: Secondary | ICD-10-CM | POA: Insufficient documentation

## 2023-10-19 MED ORDER — IOHEXOL 9 MG/ML PO SOLN
ORAL | Status: AC
  Filled 2023-10-19: qty 1000

## 2023-10-19 MED ORDER — IOHEXOL 300 MG/ML  SOLN
100.0000 mL | Freq: Once | INTRAMUSCULAR | Status: AC | PRN
  Administered 2023-10-19: 100 mL via INTRAVENOUS

## 2023-10-19 MED ORDER — IOHEXOL 9 MG/ML PO SOLN
1000.0000 mL | ORAL | Status: AC
  Administered 2023-10-19 (×2): 500 mL via ORAL

## 2023-10-19 MED ORDER — IOHEXOL 300 MG/ML  SOLN: 100.0000 mL | Freq: Once | INTRAMUSCULAR | Status: DC | PRN

## 2023-10-20 ENCOUNTER — Ambulatory Visit: Payer: Self-pay | Admitting: Gastroenterology

## 2023-10-21 ENCOUNTER — Encounter: Payer: Self-pay | Admitting: Gastroenterology

## 2023-10-21 ENCOUNTER — Ambulatory Visit (INDEPENDENT_AMBULATORY_CARE_PROVIDER_SITE_OTHER): Admitting: Gastroenterology

## 2023-10-21 VITALS — BP 122/78 | HR 104 | Ht 69.0 in | Wt 188.0 lb

## 2023-10-21 DIAGNOSIS — K5720 Diverticulitis of large intestine with perforation and abscess without bleeding: Secondary | ICD-10-CM

## 2023-10-21 DIAGNOSIS — R972 Elevated prostate specific antigen [PSA]: Secondary | ICD-10-CM | POA: Diagnosis not present

## 2023-10-21 DIAGNOSIS — N400 Enlarged prostate without lower urinary tract symptoms: Secondary | ICD-10-CM | POA: Diagnosis not present

## 2023-10-21 DIAGNOSIS — K5732 Diverticulitis of large intestine without perforation or abscess without bleeding: Secondary | ICD-10-CM

## 2023-10-21 DIAGNOSIS — K219 Gastro-esophageal reflux disease without esophagitis: Secondary | ICD-10-CM

## 2023-10-21 MED ORDER — RABEPRAZOLE SODIUM 20 MG PO TBEC: 20.0000 mg | DELAYED_RELEASE_TABLET | Freq: Two times a day (BID) | ORAL | 3 refills | Status: DC

## 2023-10-21 NOTE — Progress Notes (Signed)
 ROGUE RAFALSKI    [id number]    [birth year]  Primary Care Physician:Clark, Ronna Coho, PA-C  Referring Physician: Bettie Bruce, PA-C [address] [place],  [place] [address]   Chief complaint:  GERD, h/o sigmoid diverticulitis   History of Present Illness Tylik Treese Burley "Carolyn Cisco" is a 53 year old male with diverticulitis who presents for follow-up after recent hospitalization and surgery.  He has been experiencing diverticulitis for six to seven years with intermittent episodes. The most recent episode was severe, involving colitis and an abscess, which required hospitalization and IV antibiotics. He is currently pain-free, and a recent CT scan confirmed the resolution of the abscess. The past three months have been particularly challenging, with the last two months being especially difficult.  For gastroesophageal reflux disease (GERD), he takes Nexium  20 mg in the morning but experiences most symptoms in the evening. He also uses Pepcid  20 mg at bedtime as needed. Aciphex  was effective in the past, but insurance issues have prevented its use.  He has a history of an enlarged prostate, monitored with PSA levels. His PSA was slightly elevated at 1.5.   CT abd & pelvis 10/19/2023 Significant interval improvement in sigmoid diverticulitis with interval resolution of the small peridiverticular abscess.  Colonoscopy 01/19/2023 - One 5 mm polyp in the transverse colon, removed with a cold snare. Resected and retrieved. - Moderate diverticulosis in the left colon. Biopsied. - The examination was otherwise normal on direct and retroflexion views.   Outpatient Encounter Medications as of 10/21/2023  Medication Sig   acetaminophen  (TYLENOL ) 500 MG tablet Take 500-1,000 mg by mouth every 6 (six) hours as needed for moderate pain (pain score 4-6).   buPROPion  (WELLBUTRIN  XL) 150 MG 24 hr tablet Take 450 mg by mouth daily.   butalbital -acetaminophen -caffeine   (FIORICET ) 50-325-40 MG tablet Take 1 tablet by mouth every 4 (four) hours as needed for headache.   diltiazem  (CARDIZEM  CD) 240 MG 24 hr capsule TAKE 1 CAPSULE BY MOUTH EVERY DAY   esomeprazole  (NEXIUM ) 20 MG capsule Take 40 mg by mouth daily at 12 noon.   HYDROcodone -acetaminophen  (NORCO/VICODIN) 5-325 MG tablet Take 1 tablet by mouth every 6 (six) hours as needed.   ondansetron  (ZOFRAN -ODT) 4 MG disintegrating tablet Take 1 tablet (4 mg total) by mouth every 8 (eight) hours as needed for nausea or vomiting.   [DISCONTINUED] polyvinyl alcohol  (LIQUIFILM TEARS) 1.4 % ophthalmic solution Place 1 drop into the left eye as needed for dry eyes.   fluconazole  (DIFLUCAN ) 150 MG tablet Take 150 mg by mouth once a week. (Patient not taking: Reported on 10/21/2023)   losartan  (COZAAR ) 25 MG tablet Take 2 tablets (50 mg total) by mouth daily. (Patient taking differently: Take 25 mg by mouth daily.)   rivaroxaban  (XARELTO ) 20 MG TABS tablet Take 1 tablet (20 mg total) by mouth daily with supper.   rosuvastatin  (CRESTOR ) 40 MG tablet Take 1 tablet (40 mg total) by mouth once a week.   tamsulosin  (FLOMAX ) 0.4 MG CAPS capsule Take 1 capsule (0.4 mg total) by mouth daily.   tiZANidine  (ZANAFLEX ) 4 MG tablet Take 4 mg by mouth every 8 (eight) hours as needed for muscle spasms.   valACYclovir  (VALTREX ) 500 MG tablet Take 1 tablet (500 mg total) by mouth daily.   [DISCONTINUED] lisinopril -hydrochlorothiazide  (PRINZIDE ,ZESTORETIC ) 20-25 MG per tablet Take 1 tablet by mouth daily.   Facility-Administered Encounter Medications as of 10/21/2023  Medication  gadopentetate dimeglumine  (MAGNEVIST ) injection 18 mL    Allergies as of 10/21/2023 - Review Complete 10/21/2023  Allergen Reaction Noted   Bismuth subsalicylate Swelling and Other (See Comments) 08/09/2015   Compazine  [prochlorperazine  edisylate] Other (See Comments) 03/24/2017    Past Medical History:  Diagnosis Date   Acute DVT of left tibial vein (HCC)  04/24/2017   Xarelto  started (dx'd in emergency dept).  Dr. Maria Shiner recommended full dose xarelto  x 59mo, with an additional year of maintenance dose xarelto  (as of 05/2017 initial consult note).  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  Pt Factor V leiden heterozygote.  Stable as of 09/2017 hem f/u.   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  Pt referred for sleep study.   Chronic renal insufficiency, stage II (mild)    GFR 60s-70s (suspect HTN and NSAID damage as etiology).  GFR dipped into 50s fall 2018 after lots of NSAID use for chronic daily HA's.   Diverticulitis 2016   CT 01/2015   Frequent headaches    Gastric polyp 2006   GERD without esophagitis    Herpes labialis    Hiatal hernia    Hyperlipidemia    HYPERTENSION 01/05/2007   OSA (obstructive sleep apnea) 04/2017   Dr. Omar Bibber eval 04/2017-plan for sleep study.   PSORIASIS 01/05/2007    Past Surgical History:  Procedure Laterality Date   COLONOSCOPY W/ POLYPECTOMY  10/04/15   7mm tubular adenoma + diverticulosis: recall 5 yrs (Dr. Sandrea Cruel)   ESOPHAGOGASTRODUODENOSCOPY  04/12/14   Barrett's esophagus   LIPOMA EXCISION     several, arm    Family History  Problem Relation Age of Onset   Hypertension Mother    CAD Father        around age 53   Heart disease Father    Hyperlipidemia Father    Hypertension Father    Hypertension Brother     Hyperlipidemia Brother    Lung cancer Maternal Grandmother    Colon cancer Maternal Grandfather    Cancer Maternal Grandfather        type unknown   CAD Paternal Grandmother    Diabetes Paternal Grandmother    Colon cancer Other        neg hx   Prostate cancer Other        neg hx   Esophageal cancer Neg Hx    Stomach cancer Neg Hx     Social History   Socioeconomic History   Marital status: Single    Spouse name: Not on file   Number of children: 3   Years of education: Not on file   Highest education level: Not on file  Occupational History   Occupation: regional Mudlogger    Comment: Industrial/product designer  Tobacco Use   Smoking status: Never   Smokeless tobacco: Never  Vaping Use   Vaping status: Never Used  Substance and Sexual  Activity   Alcohol  use: Yes    Alcohol /week: 2.0 standard drinks of alcohol     Types: 2 Cans of beer per week    Comment: rare   Drug use: No   Sexual activity: Yes    Birth control/protection: None  Other Topics Concern   Not on file  Social History Narrative   Currently separated as of 01/2014, 2 children.  One younger brother.   Occupation: Airline pilot for [employer]   No tobacco.   Occ alcohol  (1-2 x/month).  No hx of alc/drug prob.   Exercise: runs about 2 times a week. Not as much lately due to medical state   Diet: regular american diet.   Right handed   1 cup coffee daily      Social Drivers of Health   Financial Resource Strain: Low Risk  (10/15/2023)   Received from [facility]   Overall Financial Resource Strain (CARDIA)    Difficulty of Paying Living Expenses: Not very hard  Food Insecurity: No Food Insecurity (10/15/2023)   Received from [facility]   Hunger Vital Sign    Worried About Running Out of Food in the Last Year: Never true    Ran Out of Food in the Last Year: Never true  Transportation Needs: No Transportation Needs (10/15/2023)   Received from [facility] - Transportation    In the past 12 months, has lack of transportation kept you from medical appointments or from getting medications?: No    Lack of Transportation (Non-Medical): No  Physical Activity: Not on file  Stress: Not on file  Social Connections: Unknown (10/14/2021)   Received from [facility]   Social Network    Social Network: Not on file  Intimate Partner Violence: Not At Risk (09/21/2023)   Humiliation, Afraid, Rape, and Kick questionnaire    Fear of Current or Ex-Partner: No    Emotionally Abused: No    Physically Abused: No    Sexually Abused: No      Review of systems: All other review of systems negative except as mentioned in the HPI.   Physical Exam: Vitals:   10/21/23 1120  BP: 122/78  Pulse: (!) 104  SpO2: 96%   Body mass index is 27.76 kg/m. Gen:      No acute distress HEENT:  sclera anicteric Abd:      soft, non-tender; no palpable masses, no distension Ext:    No edema Neuro: alert and oriented x 3 Psych: normal mood and affect  Data Reviewed:  Reviewed labs, radiology imaging, old records and pertinent past GI work up     Assessment and Plan Assessment & Plan Diverticulitis Recurrent diverticulitis with severe recent episode involving colitis and an abscess. Condition persists for 5-6 years with intermittent episodes, recent one prolonged. Risk of recurrence present. Dietary modifications recommended. Elective sigmoid colon resection considered due to recurrent episodes and risk of complications. Discussed surgical benefits to prevent recurrence and avoid emergent surgery or colostomy. Laparoscopic approach typical with overnight hospital stay and one-month recovery. - Ensure regular bowel habits to prevent constipation. - Increase intake of fruits, vegetables, and whole grains. - Avoid popcorn as it may trigger episodes. - Maintain adequate hydration. - Refer to Dr. Joyce Nixon or Dr. Camilo Cella, colorectal  surgeons, to discuss possible elective sigmoid colon resection. - Consider a liquid diet for 1-2 days if symptoms recur. - Initiate antibiotics if symptoms do not improve with bowel  rest.  Gastroesophageal reflux disease (GERD) GERD symptoms worsen in the evenings. Current management with Nexium  requires optimization of timing and dosage. Aciphex  previously effective, but insurance issues prevent use. Goal is symptom management with minimal PPI.  - Prescribe Aciphex  20 mg  twice daily as needed. - Avoid late dinners and bedtime snacking. - Use Pepcid  (famotidine ) 20 mg at bedtime if symptoms persist.  - Ensure adequate intake of vitamin D, multivitamins, and monitor B12, vitamin D, and magnesium  levels due to long-term PPI use.  Benign prostatic hyperplasia PSA levels slightly elevated at 1.5, possibly due to prostate enlargement. Monitoring necessary to prevent complications. - Monitor PSA levels and prostate exams regularly.    The patient was provided an opportunity to ask questions and all were answered. The patient agreed with the plan and demonstrated an understanding of the instructions.  Lorena Rolling , MD    CC: Bettie Bruce, PA-C

## 2023-10-21 NOTE — Patient Instructions (Addendum)
 We have sent the following medications to your pharmacy for you to pick up at your convenience: Aciphex .    VISIT SUMMARY:  Benjamin [name], a 53 year old male, came in for a follow-up visit after a recent hospitalization and surgery for severe diverticulitis. He has a history of recurrent diverticulitis, GERD, and an enlarged prostate. He is currently pain-free and his recent CT scan confirmed the resolution of an abscess.  YOUR PLAN:  -DIVERTICULITIS: Diverticulitis is a condition where small pouches in the colon become inflamed or infected. You have had recurrent episodes, with the most recent one being severe. To prevent future episodes, you should ensure regular bowel habits, increase your intake of fruits, vegetables, and whole grains, avoid popcorn, and maintain adequate hydration. We discussed the possibility of an elective sigmoid colon resection to prevent recurrence and avoid emergency surgery. You will be referred to Dr. Joyce Nixon or Dr.Chris Camilo Cella, colorectal surgeons, to discuss this further. If symptoms recur, consider a liquid diet for 1-2 days and start antibiotics if there is no improvement with bowel rest.  -GASTROESOPHAGEAL REFLUX DISEASE (GERD): GERD is a condition where stomach acid frequently flows back into the tube connecting your mouth and stomach, causing irritation. Your symptoms worsen in the evenings.  avoid late dinners and bedtime snacking, and use Pepcid  20 mg at bedtime if symptoms persist. We will also try to get insurance approval for Aciphex , which was effective for you in the past. Ensure you are getting enough vitamin D, multivitamins, and monitor your B12, vitamin D, and magnesium  levels due to long-term PPI use.  -BENIGN PROSTATIC HYPERPLASIA: Benign prostatic hyperplasia is an enlarged prostate gland that can cause urinary problems. Your PSA levels are slightly elevated at 1.5, which may be due to this condition. Regular monitoring of your PSA levels and prostate  exams is necessary to prevent complications.  INSTRUCTIONS:  Follow up with Dr. Joyce Nixon or Dr. Camilo Cella, colorectal surgeons, to discuss the possibility of an elective sigmoid colon resection. Continue to monitor your PSA levels and prostate exams regularly. If GERD symptoms persist, we will try to get insurance approval for Aciphex .

## 2023-10-22 ENCOUNTER — Encounter: Payer: Self-pay | Admitting: Gastroenterology

## 2024-03-08 ENCOUNTER — Other Ambulatory Visit: Payer: Self-pay

## 2024-03-08 ENCOUNTER — Emergency Department (HOSPITAL_BASED_OUTPATIENT_CLINIC_OR_DEPARTMENT_OTHER)

## 2024-03-08 ENCOUNTER — Inpatient Hospital Stay (HOSPITAL_BASED_OUTPATIENT_CLINIC_OR_DEPARTMENT_OTHER)
Admission: EM | Admit: 2024-03-08 | Discharge: 2024-03-12 | DRG: 392 | Disposition: A | Discharge status: Home / Self Care | Attending: Internal Medicine | Admitting: Internal Medicine

## 2024-03-08 DIAGNOSIS — N400 Enlarged prostate without lower urinary tract symptoms: Secondary | ICD-10-CM | POA: Diagnosis present

## 2024-03-08 DIAGNOSIS — Z79899 Other long term (current) drug therapy: Secondary | ICD-10-CM

## 2024-03-08 DIAGNOSIS — Z7901 Long term (current) use of anticoagulants: Secondary | ICD-10-CM

## 2024-03-08 DIAGNOSIS — Z86718 Personal history of other venous thrombosis and embolism: Secondary | ICD-10-CM

## 2024-03-08 DIAGNOSIS — K651 Peritoneal abscess: Secondary | ICD-10-CM | POA: Diagnosis present

## 2024-03-08 DIAGNOSIS — Z862 Personal history of diseases of the blood and blood-forming organs and certain disorders involving the immune mechanism: Secondary | ICD-10-CM

## 2024-03-08 DIAGNOSIS — Z8249 Family history of ischemic heart disease and other diseases of the circulatory system: Secondary | ICD-10-CM

## 2024-03-08 DIAGNOSIS — Z800 Family history of malignant neoplasm of digestive organs: Secondary | ICD-10-CM

## 2024-03-08 DIAGNOSIS — I129 Hypertensive chronic kidney disease with stage 1 through stage 4 chronic kidney disease, or unspecified chronic kidney disease: Secondary | ICD-10-CM | POA: Diagnosis present

## 2024-03-08 DIAGNOSIS — F32A Depression, unspecified: Secondary | ICD-10-CM | POA: Diagnosis present

## 2024-03-08 DIAGNOSIS — N182 Chronic kidney disease, stage 2 (mild): Secondary | ICD-10-CM | POA: Diagnosis present

## 2024-03-08 DIAGNOSIS — K5720 Diverticulitis of large intestine with perforation and abscess without bleeding: Principal | ICD-10-CM | POA: Diagnosis present

## 2024-03-08 DIAGNOSIS — R109 Unspecified abdominal pain: Secondary | ICD-10-CM | POA: Diagnosis not present

## 2024-03-08 DIAGNOSIS — E785 Hyperlipidemia, unspecified: Secondary | ICD-10-CM | POA: Diagnosis present

## 2024-03-08 DIAGNOSIS — Z833 Family history of diabetes mellitus: Secondary | ICD-10-CM

## 2024-03-08 DIAGNOSIS — Z888 Allergy status to other drugs, medicaments and biological substances status: Secondary | ICD-10-CM

## 2024-03-08 DIAGNOSIS — Z23 Encounter for immunization: Secondary | ICD-10-CM

## 2024-03-08 DIAGNOSIS — G43909 Migraine, unspecified, not intractable, without status migrainosus: Secondary | ICD-10-CM

## 2024-03-08 DIAGNOSIS — Z801 Family history of malignant neoplasm of trachea, bronchus and lung: Secondary | ICD-10-CM

## 2024-03-08 DIAGNOSIS — Z83438 Family history of other disorder of lipoprotein metabolism and other lipidemia: Secondary | ICD-10-CM

## 2024-03-08 DIAGNOSIS — D6851 Activated protein C resistance: Secondary | ICD-10-CM | POA: Diagnosis present

## 2024-03-08 DIAGNOSIS — K5792 Diverticulitis of intestine, part unspecified, without perforation or abscess without bleeding: Secondary | ICD-10-CM | POA: Diagnosis present

## 2024-03-08 DIAGNOSIS — F419 Anxiety disorder, unspecified: Secondary | ICD-10-CM | POA: Diagnosis present

## 2024-03-08 DIAGNOSIS — K219 Gastro-esophageal reflux disease without esophagitis: Secondary | ICD-10-CM | POA: Diagnosis present

## 2024-03-08 LAB — URINALYSIS, ROUTINE W REFLEX MICROSCOPIC
Bacteria, UA: NONE SEEN
Bilirubin Urine: NEGATIVE
Glucose, UA: NEGATIVE mg/dL
Ketones, ur: NEGATIVE mg/dL
Leukocytes,Ua: NEGATIVE
Nitrite: NEGATIVE
Protein, ur: NEGATIVE mg/dL
Specific Gravity, Urine: >1.046 — ABNORMAL HIGH (ref 1.005–1.030)
pH: 6.5 (ref 5.0–8.0)

## 2024-03-08 LAB — CBC
HCT: 42.5 % (ref 39.0–52.0)
Hemoglobin: 15 g/dL (ref 13.0–17.0)
MCH: 31.2 pg (ref 26.0–34.0)
MCHC: 35.3 g/dL (ref 30.0–36.0)
MCV: 88.4 fL (ref 80.0–100.0)
Platelets: 365 K/uL (ref 150–400)
RBC: 4.81 MIL/uL (ref 4.22–5.81)
RDW: 12.4 % (ref 11.5–15.5)
WBC: 15.2 K/uL — ABNORMAL HIGH (ref 4.0–10.5)
nRBC: 0 % (ref 0.0–0.2)

## 2024-03-08 LAB — COMPREHENSIVE METABOLIC PANEL WITH GFR
ALT: 15 U/L (ref 0–44)
AST: 25 U/L (ref 15–41)
Albumin: 4.5 g/dL (ref 3.5–5.0)
Alkaline Phosphatase: 78 U/L (ref 38–126)
Anion gap: 17 — ABNORMAL HIGH (ref 5–15)
BUN: 16 mg/dL (ref 6–20)
CO2: 20 mmol/L — ABNORMAL LOW (ref 22–32)
Calcium: 9.1 mg/dL (ref 8.9–10.3)
Chloride: 102 mmol/L (ref 98–111)
Creatinine, Ser: 1.21 mg/dL (ref 0.61–1.24)
GFR, Estimated: >60 mL/min (ref >60)
Glucose, Bld: 162 mg/dL — ABNORMAL HIGH (ref 70–99)
Potassium: 4 mmol/L (ref 3.5–5.1)
Sodium: 139 mmol/L (ref 135–145)
Total Bilirubin: 0.3 mg/dL (ref 0.0–1.2)
Total Protein: 7.2 g/dL (ref 6.5–8.1)

## 2024-03-08 LAB — LIPASE, BLOOD: Lipase: 12 U/L (ref 11–51)

## 2024-03-08 MED ORDER — PIPERACILLIN-TAZOBACTAM 3.375 G IVPB 30 MIN
3.3750 g | Freq: Once | INTRAVENOUS | Status: AC
  Administered 2024-03-08: 3.375 g via INTRAVENOUS
  Filled 2024-03-08: qty 50

## 2024-03-08 MED ORDER — OXYCODONE HCL 5 MG PO TABS: 2.5000 mg | ORAL_TABLET | ORAL | Status: DC | PRN

## 2024-03-08 MED ORDER — HYDROMORPHONE HCL 1 MG/ML IJ SOLN
0.5000 mg | INTRAMUSCULAR | Status: DC | PRN
  Administered 2024-03-08 – 2024-03-09 (×2): 0.5 mg via INTRAVENOUS
  Filled 2024-03-08: qty 1
  Filled 2024-03-08: qty 0.5

## 2024-03-08 MED ORDER — MORPHINE SULFATE (PF) 4 MG/ML IV SOLN
4.0000 mg | Freq: Once | INTRAVENOUS | Status: AC
  Administered 2024-03-08: 4 mg via INTRAVENOUS
  Filled 2024-03-08: qty 1

## 2024-03-08 MED ORDER — SODIUM CHLORIDE 0.9 % IV SOLN: INTRAVENOUS | Status: AC

## 2024-03-08 MED ORDER — ONDANSETRON HCL 4 MG/2ML IJ SOLN
4.0000 mg | Freq: Once | INTRAMUSCULAR | Status: AC
  Administered 2024-03-08: 4 mg via INTRAVENOUS

## 2024-03-08 MED ORDER — OXYCODONE HCL 5 MG PO TABS: 5.0000 mg | ORAL_TABLET | ORAL | Status: DC | PRN

## 2024-03-08 MED ORDER — PIPERACILLIN-TAZOBACTAM 3.375 G IVPB
3.3750 g | Freq: Three times a day (TID) | INTRAVENOUS | Status: DC
  Administered 2024-03-09 – 2024-03-11 (×7): 3.375 g via INTRAVENOUS
  Filled 2024-03-08 (×7): qty 50

## 2024-03-08 MED ORDER — IOHEXOL 300 MG/ML  SOLN
100.0000 mL | Freq: Once | INTRAMUSCULAR | Status: AC | PRN
  Administered 2024-03-08: 100 mL via INTRAVENOUS

## 2024-03-08 MED ORDER — ONDANSETRON HCL 4 MG/2ML IJ SOLN
INTRAMUSCULAR | Status: AC
  Filled 2024-03-08: qty 2

## 2024-03-08 MED ORDER — HYDROMORPHONE HCL 1 MG/ML IJ SOLN
1.0000 mg | Freq: Once | INTRAMUSCULAR | Status: AC
  Administered 2024-03-08: 1 mg via INTRAVENOUS
  Filled 2024-03-08: qty 1

## 2024-03-08 NOTE — ED Triage Notes (Signed)
 Pt POV reporting abd pain, hx diverticulitis, feels like same.

## 2024-03-08 NOTE — Plan of Care (Addendum)
 Plan of Care Note for accepted transfer  Patient: Benjamin [name]    FMW:[id number]  DOA: 03/08/2024     Facility requesting transfer: [facility]  Requesting Provider: Mannie Fairy DASEN, DO  Reason for transfer: Diverticulitis, complicated with possible intramural abscess  Facility course:   29 M with hx of recurrent diverticulitis last in 4/'25, Previous DVT on Xarelto  in the setting of factor V Leiden HTN, HLD, migraines, BPH, reflux, p/w abd pain and found to have recurrent diverticulitis with possible intramural abscess. Treated with Zosyn , pain meds, zofran . I have continued abx, started on mIVF and ordered prn pain meds   CT this admission demonstrating following:  1. Acute diverticulitis involving the mid sigmoid colon with marked submucosal edema and possible rim enhancing intramural abscess. No free air, free fluid or free pelvic abscess.  Plan of care: The patient is accepted for admission to Med-surg  unit, at [facility]    Author: Dorn Dawson, MD  03/08/2024  Check [email or web address] for on-call coverage.  Nursing staff, Please call [facility] Admits & Consults System-Wide number on Amion as soon as patient's arrival, so appropriate admitting provider can evaluate the pt.

## 2024-03-08 NOTE — ED Provider Notes (Signed)
 [facility] AT [facility] Provider Note   CSN: [id number] Arrival date & time: 03/08/24  8048     Patient presents with: Abdominal Pain   Benjamin [name] is a 53 y.o. male.   This is a 53 year old male who is here today with lower abdominal pain that is been ongoing for last couple days, worsened today.  Patient with history of diverticulitis, states this feels similar.  He has not had fever or chills.  No vomiting.   Abdominal Pain      Prior to Admission medications   Medication Sig Start Date End Date Taking? Authorizing Provider  acetaminophen  (TYLENOL ) 500 MG tablet Take 500-1,000 mg by mouth every 6 (six) hours as needed for moderate pain (pain score 4-6).    [provider]  buPROPion  (WELLBUTRIN  XL) 150 MG 24 hr tablet Take 450 mg by mouth daily.    [provider]  butalbital -acetaminophen -caffeine  (FIORICET ) 50-325-40 MG tablet Take 1 tablet by mouth every 4 (four) hours as needed for headache. 09/25/23   Samtani, Jai-Gurmukh, MD  diltiazem  (CARDIZEM  CD) 240 MG 24 hr capsule TAKE 1 CAPSULE BY MOUTH EVERY DAY 02/19/21   Patwardhan, Manish J, MD  esomeprazole  (NEXIUM ) 20 MG capsule Take 40 mg by mouth daily at 12 noon.    [provider]  fluconazole  (DIFLUCAN ) 150 MG tablet Take 150 mg by mouth once a week. Patient not taking: Reported on 10/21/2023    [provider]  HYDROcodone -acetaminophen  (NORCO/VICODIN) 5-325 MG tablet Take 1 tablet by mouth every 6 (six) hours as needed. 09/25/23   Samtani, Jai-Gurmukh, MD  losartan  (COZAAR ) 25 MG tablet Take 2 tablets (50 mg total) by mouth daily. Patient taking differently: Take 25 mg by mouth daily. 09/27/22 09/21/23  Ezenduka, Nkeiruka J, MD  ondansetron  (ZOFRAN -ODT) 4 MG disintegrating tablet Take 1 tablet (4 mg total) by mouth every 8 (eight) hours as needed for nausea or vomiting. 08/12/23   Schutt, Marsa HERO, PA-C  RABEprazole  (ACIPHEX ) 20 MG tablet Take 1  tablet (20 mg total) by mouth in the morning and at bedtime. 10/21/23   [name], Kavitha V, MD  rivaroxaban  (XARELTO ) 20 MG TABS tablet Take 1 tablet (20 mg total) by mouth daily with supper. 10/04/22   Yolande Lamar BROCKS, MD  rosuvastatin  (CRESTOR ) 40 MG tablet Take 1 tablet (40 mg total) by mouth once a week. 12/27/17   Gretta Ozell CROME, PA-C  tamsulosin  (FLOMAX ) 0.4 MG CAPS capsule Take 1 capsule (0.4 mg total) by mouth daily. 04/05/18   Wiseman, Brittany D, PA-C  tiZANidine  (ZANAFLEX ) 4 MG tablet Take 4 mg by mouth every 8 (eight) hours as needed for muscle spasms.    [provider]  valACYclovir  (VALTREX ) 500 MG tablet Take 1 tablet (500 mg total) by mouth daily. 12/30/17   Gretta Ozell CROME, PA-C  lisinopril -hydrochlorothiazide  (PRINZIDE ,ZESTORETIC ) 20-25 MG per tablet Take 1 tablet by mouth daily. 07/24/11 08/21/11  Jame Maude FALCON, MD    Allergies: Bismuth subsalicylate and Compazine  [prochlorperazine  edisylate]    Review of Systems  Gastrointestinal:  Positive for abdominal pain.    Updated Vital Signs BP (!) 160/117 (BP Location: Left Arm)   Pulse 94   Temp 97.9 F (36.6 C) (Oral)   Resp 20   Ht 5' 9 (1.753 m)   Wt 86.2 kg   SpO2 99%   BMI 28.06 kg/m   Physical Exam Vitals and nursing note reviewed.  Abdominal:     General: Abdomen is flat.  Palpations: Abdomen is soft.     Tenderness: There is abdominal tenderness in the right lower quadrant, suprapubic area and left lower quadrant.  Neurological:     Mental Status: He is alert.     (all labs ordered are listed, but only abnormal results are displayed) Labs Reviewed  COMPREHENSIVE METABOLIC PANEL WITH GFR - Abnormal; Notable for the following components:      Result Value   CO2 20 (*)    Glucose, Bld 162 (*)    Anion gap 17 (*)    All other components within normal limits  CBC - Abnormal; Notable for the following components:   WBC 15.2 (*)    All other components within normal limits  LIPASE,  BLOOD  URINALYSIS, ROUTINE W REFLEX MICROSCOPIC    EKG: None  Radiology: CT ABDOMEN PELVIS W CONTRAST Result Date: 03/08/2024 CLINICAL DATA:  Abdominal pain.  History of diverticulitis. EXAM: CT ABDOMEN AND PELVIS WITH CONTRAST TECHNIQUE: Multidetector CT imaging of the abdomen and pelvis was performed using the standard protocol following bolus administration of intravenous contrast. RADIATION DOSE REDUCTION: This exam was performed according to the departmental dose-optimization program which includes automated exposure control, adjustment of the mA and/or kV according to patient size and/or use of iterative reconstruction technique. CONTRAST:  OMNIPAQUE  IOHEXOL  300 MG/ML  SOLN COMPARISON:  CT scan 10/19/2023 FINDINGS: Lower chest: The lung bases are clear of acute process. No pleural effusion or pulmonary lesions. The heart is normal in size. No pericardial effusion. The distal esophagus and aorta are unremarkable. Hepatobiliary: No focal liver abnormality is seen. No gallstones, gallbladder wall thickening, or biliary dilatation. Pancreas: Unremarkable. No pancreatic ductal dilatation or surrounding inflammatory changes. Spleen: Normal in size without focal abnormality. Adrenals/Urinary Tract: The adrenal glands are normal. Bilateral renal calculi noted but no hydroureteronephrosis to suggest obstructing ureteral calculus. The bladder is unremarkable. No renal lesions or findings to suggest pyelonephritis. Stomach/Bowel: The stomach, duodenum and small bowel are unremarkable. No acute inflammatory process or obstructive findings. The terminal ileum is normal. The appendix is normal. Changes of acute diverticulitis involving the mid sigmoid colon with marked submucosal edema and possible rim enhancing intramural abscess. No free air, free fluid or free pelvic abscess. Stable underlying changes of diverticulosis. Vascular/Lymphatic: The aorta and branch vessels are patent. Scattered atherosclerotic  calcifications. The major venous structures are patent. Small scattered mesenteric and retroperitoneal lymph nodes but no mass or adenopathy. Reproductive: The prostate gland and seminal vesicles are unremarkable. Other: Small periumbilical abdominal wall hernia containing fat. No subcutaneous lesions. No inguinal hernia or adenopathy. Musculoskeletal: No significant bony findings. IMPRESSION: 1. Acute diverticulitis involving the mid sigmoid colon with marked submucosal edema and possible rim enhancing intramural abscess. No free air, free fluid or free pelvic abscess. 2. Bilateral renal calculi but no obstructing ureteral calculus. 3. Small periumbilical abdominal wall hernia containing fat. Electronically Signed   By: MYRTIS Stammer M.D.   On: 03/08/2024 21:44     Procedures   Medications Ordered in the ED  piperacillin -tazobactam (ZOSYN ) IVPB 3.375 g (3.375 g Intravenous New Bag/Given 03/08/24 2151)  ondansetron  (ZOFRAN ) injection 4 mg (4 mg Intravenous Given 03/08/24 2024)  morphine  (PF) 4 MG/ML injection 4 mg (4 mg Intravenous Given 03/08/24 2049)  HYDROmorphone  (DILAUDID ) injection 1 mg (1 mg Intravenous Given 03/08/24 2100)  iohexol  (OMNIPAQUE ) 300 MG/ML solution 100 mL (100 mLs Intravenous Contrast Given 03/08/24 2117)  Medical Decision Making 53 year old male here today with lower abdominal pain.  Differential diagnoses include diverticulitis, intra-abdominal abscess, consider bowel perforation, less likely AAA, less likely dissection.  Plan-given the patient's history, I do think he likely has diverticulitis.  Given that he has had complications in the past, CT imaging is warranted.  Analgesia provided.  I reviewed the patient's most recent surgical consultation note.  Reassessment 10 PM-patient's imaging does show intramural abscess formation.  He still having significant pain requiring IV analgesia.  Leukocytosis present.  Given the patient's CT  findings, pain, do believe he requires admission for IV antibiotics.  Have started the patient on Zosyn .  Will admit him to the hospitalist.    Amount and/or Complexity of Data Reviewed Labs: ordered. Radiology: ordered.  Risk Prescription drug management.        Final diagnoses:  Diverticulitis of large intestine with abscess without bleeding    ED Discharge Orders     None          Mannie Fairy DASEN, DO 03/08/24 2210

## 2024-03-09 ENCOUNTER — Encounter (HOSPITAL_COMMUNITY): Payer: Self-pay | Admitting: Internal Medicine

## 2024-03-09 DIAGNOSIS — E785 Hyperlipidemia, unspecified: Secondary | ICD-10-CM | POA: Diagnosis present

## 2024-03-09 DIAGNOSIS — Z7901 Long term (current) use of anticoagulants: Secondary | ICD-10-CM | POA: Diagnosis not present

## 2024-03-09 DIAGNOSIS — K5720 Diverticulitis of large intestine with perforation and abscess without bleeding: Secondary | ICD-10-CM | POA: Diagnosis present

## 2024-03-09 DIAGNOSIS — G43909 Migraine, unspecified, not intractable, without status migrainosus: Secondary | ICD-10-CM

## 2024-03-09 DIAGNOSIS — N400 Enlarged prostate without lower urinary tract symptoms: Secondary | ICD-10-CM | POA: Diagnosis present

## 2024-03-09 DIAGNOSIS — K5792 Diverticulitis of intestine, part unspecified, without perforation or abscess without bleeding: Secondary | ICD-10-CM | POA: Diagnosis not present

## 2024-03-09 DIAGNOSIS — Z862 Personal history of diseases of the blood and blood-forming organs and certain disorders involving the immune mechanism: Secondary | ICD-10-CM

## 2024-03-09 DIAGNOSIS — Z888 Allergy status to other drugs, medicaments and biological substances status: Secondary | ICD-10-CM | POA: Diagnosis not present

## 2024-03-09 DIAGNOSIS — Z86718 Personal history of other venous thrombosis and embolism: Secondary | ICD-10-CM | POA: Diagnosis not present

## 2024-03-09 DIAGNOSIS — F419 Anxiety disorder, unspecified: Secondary | ICD-10-CM | POA: Diagnosis present

## 2024-03-09 DIAGNOSIS — Z23 Encounter for immunization: Secondary | ICD-10-CM | POA: Diagnosis not present

## 2024-03-09 DIAGNOSIS — Z79899 Other long term (current) drug therapy: Secondary | ICD-10-CM | POA: Diagnosis not present

## 2024-03-09 DIAGNOSIS — Z801 Family history of malignant neoplasm of trachea, bronchus and lung: Secondary | ICD-10-CM | POA: Diagnosis not present

## 2024-03-09 DIAGNOSIS — R109 Unspecified abdominal pain: Secondary | ICD-10-CM | POA: Diagnosis present

## 2024-03-09 DIAGNOSIS — K219 Gastro-esophageal reflux disease without esophagitis: Secondary | ICD-10-CM | POA: Diagnosis present

## 2024-03-09 DIAGNOSIS — Z83438 Family history of other disorder of lipoprotein metabolism and other lipidemia: Secondary | ICD-10-CM | POA: Diagnosis not present

## 2024-03-09 DIAGNOSIS — Z833 Family history of diabetes mellitus: Secondary | ICD-10-CM | POA: Diagnosis not present

## 2024-03-09 DIAGNOSIS — I10 Essential (primary) hypertension: Secondary | ICD-10-CM

## 2024-03-09 DIAGNOSIS — D6851 Activated protein C resistance: Secondary | ICD-10-CM | POA: Diagnosis present

## 2024-03-09 DIAGNOSIS — F32A Depression, unspecified: Secondary | ICD-10-CM | POA: Diagnosis present

## 2024-03-09 DIAGNOSIS — Z8249 Family history of ischemic heart disease and other diseases of the circulatory system: Secondary | ICD-10-CM | POA: Diagnosis not present

## 2024-03-09 DIAGNOSIS — I129 Hypertensive chronic kidney disease with stage 1 through stage 4 chronic kidney disease, or unspecified chronic kidney disease: Secondary | ICD-10-CM | POA: Diagnosis present

## 2024-03-09 DIAGNOSIS — Z800 Family history of malignant neoplasm of digestive organs: Secondary | ICD-10-CM | POA: Diagnosis not present

## 2024-03-09 DIAGNOSIS — N182 Chronic kidney disease, stage 2 (mild): Secondary | ICD-10-CM | POA: Diagnosis present

## 2024-03-09 MED ORDER — ONDANSETRON HCL 4 MG PO TABS
4.0000 mg | ORAL_TABLET | Freq: Four times a day (QID) | ORAL | Status: DC | PRN
  Filled 2024-03-09: qty 1

## 2024-03-09 MED ORDER — PANTOPRAZOLE SODIUM 40 MG PO TBEC
40.0000 mg | DELAYED_RELEASE_TABLET | Freq: Two times a day (BID) | ORAL | Status: DC
  Administered 2024-03-09 (×2): 40 mg via ORAL
  Filled 2024-03-09 (×7): qty 1

## 2024-03-09 MED ORDER — SENNOSIDES-DOCUSATE SODIUM 8.6-50 MG PO TABS: 1.0000 | ORAL_TABLET | Freq: Every evening | ORAL | Status: DC | PRN

## 2024-03-09 MED ORDER — DILTIAZEM HCL ER COATED BEADS 240 MG PO CP24
240.0000 mg | ORAL_CAPSULE | Freq: Every day | ORAL | Status: DC
  Administered 2024-03-09 – 2024-03-12 (×4): 240 mg via ORAL
  Filled 2024-03-09 (×4): qty 1

## 2024-03-09 MED ORDER — RIVAROXABAN 10 MG PO TABS
20.0000 mg | ORAL_TABLET | Freq: Every day | ORAL | Status: DC
  Administered 2024-03-09 – 2024-03-11 (×3): 20 mg via ORAL
  Filled 2024-03-09 (×3): qty 2

## 2024-03-09 MED ORDER — HYDROCODONE-ACETAMINOPHEN 5-325 MG PO TABS
1.0000 | ORAL_TABLET | Freq: Four times a day (QID) | ORAL | Status: DC | PRN
  Administered 2024-03-09 – 2024-03-10 (×2): 1 via ORAL
  Filled 2024-03-09 (×3): qty 1

## 2024-03-09 MED ORDER — VALACYCLOVIR HCL 500 MG PO TABS
500.0000 mg | ORAL_TABLET | Freq: Every day | ORAL | Status: DC
  Administered 2024-03-09 – 2024-03-12 (×4): 500 mg via ORAL
  Filled 2024-03-09 (×4): qty 1

## 2024-03-09 MED ORDER — ACETAMINOPHEN 650 MG RE SUPP: 650.0000 mg | Freq: Four times a day (QID) | RECTAL | Status: DC | PRN

## 2024-03-09 MED ORDER — ROSUVASTATIN CALCIUM 20 MG PO TABS
40.0000 mg | ORAL_TABLET | ORAL | Status: DC
  Administered 2024-03-09: 40 mg via ORAL
  Filled 2024-03-09 (×2): qty 2

## 2024-03-09 MED ORDER — BISACODYL 5 MG PO TBEC: 5.0000 mg | DELAYED_RELEASE_TABLET | Freq: Every day | ORAL | Status: DC | PRN

## 2024-03-09 MED ORDER — LOSARTAN POTASSIUM 25 MG PO TABS
25.0000 mg | ORAL_TABLET | Freq: Every day | ORAL | Status: DC
  Administered 2024-03-09 – 2024-03-12 (×4): 25 mg via ORAL
  Filled 2024-03-09 (×4): qty 1

## 2024-03-09 MED ORDER — ACETAMINOPHEN 325 MG PO TABS
650.0000 mg | ORAL_TABLET | Freq: Four times a day (QID) | ORAL | Status: DC | PRN
  Administered 2024-03-09 – 2024-03-10 (×3): 650 mg via ORAL
  Filled 2024-03-09 (×3): qty 2

## 2024-03-09 MED ORDER — ONDANSETRON HCL 4 MG/2ML IJ SOLN
4.0000 mg | Freq: Four times a day (QID) | INTRAMUSCULAR | Status: DC | PRN
  Administered 2024-03-09 – 2024-03-10 (×5): 4 mg via INTRAVENOUS
  Filled 2024-03-09 (×5): qty 2

## 2024-03-09 MED ORDER — TAMSULOSIN HCL 0.4 MG PO CAPS
0.4000 mg | ORAL_CAPSULE | Freq: Every day | ORAL | Status: DC
  Administered 2024-03-09 – 2024-03-12 (×4): 0.4 mg via ORAL
  Filled 2024-03-09 (×4): qty 1

## 2024-03-09 MED ORDER — HYDROMORPHONE HCL 1 MG/ML IJ SOLN
1.0000 mg | INTRAMUSCULAR | Status: DC | PRN
  Administered 2024-03-09 – 2024-03-10 (×4): 1 mg via INTRAVENOUS
  Filled 2024-03-09 (×4): qty 1

## 2024-03-09 MED ORDER — BUTALBITAL-APAP-CAFFEINE 50-325-40 MG PO TABS
1.0000 | ORAL_TABLET | Freq: Four times a day (QID) | ORAL | Status: DC | PRN
  Administered 2024-03-09 – 2024-03-11 (×7): 1 via ORAL
  Filled 2024-03-09 (×7): qty 1

## 2024-03-09 MED ORDER — INFLUENZA VIRUS VACC SPLIT PF (FLUZONE) 0.5 ML IM SUSY: 0.5000 mL | PREFILLED_SYRINGE | INTRAMUSCULAR | Status: DC

## 2024-03-09 MED ORDER — TIZANIDINE HCL 4 MG PO TABS: 4.0000 mg | ORAL_TABLET | Freq: Three times a day (TID) | ORAL | Status: DC | PRN

## 2024-03-09 MED ORDER — BUPROPION HCL ER (XL) 150 MG PO TB24
450.0000 mg | ORAL_TABLET | Freq: Every day | ORAL | Status: DC
  Administered 2024-03-09 – 2024-03-12 (×4): 450 mg via ORAL
  Filled 2024-03-09 (×4): qty 3

## 2024-03-09 NOTE — Plan of Care (Signed)
   Problem: Education: Goal: Knowledge of General Education information will improve Description: Including pain rating scale, medication(s)/side effects and non-pharmacologic comfort measures Outcome: Progressing   Problem: Activity: Goal: Risk for activity intolerance will decrease Outcome: Progressing

## 2024-03-09 NOTE — H&P (Signed)
 History and Physical  Benjamin [name] FMW:[id number] DOB: August 08, 1970 DOA: 03/08/2024  PCP: Gretta Ozell CROME, PA-C   Chief Complaint: Abdominal pain  HPI: Benjamin [name] is a 53 y.o. male with medical history significant for recurrent diverticulitis last in 4/'25, Previous DVT on Xarelto  in the setting of factor V Leiden, HTN, HLD, migraines, BPH, reflux, p/w abd pain.  Patient reports he started having left lower quadrant pain yesterday morning described as stabbing, radiating to the base of his abdomen. He endorsed associated nausea but denies any vomiting, fevers, chills, diarrhea, constipation, shortness of breath or chest pain.  This is his third hospitalization for diverticulitis this year but reports he has had others between these episodes.  He is currently followed by surgery at [facility] and has plans to follow-up but has not done this yet.  ED Course: Initial vitals show patient afebrile, hypertensive with SBP in the 140s to 150s. Initial labs significant for leukocytosis of 15,000 otherwise normal renal function and LFTs and UA. CT A/P shows acute diverticulitis involving the mid sigmoid colon with marked submucosal edema and possible rim enhancing intramural abscess. Pt received IV morphine , IV Zosyn  and IV Zofran . TRH was consulted for admission.   Review of Systems: Please see HPI for pertinent positives and negatives. A complete 10 system review of systems are otherwise negative.  Past Medical History:  Diagnosis Date   Acute DVT of left tibial vein (HCC) 04/24/2017   Xarelto  started (dx'd in emergency dept).  Dr. Timmy recommended full dose xarelto  x 57mo, with an additional year of maintenance dose xarelto  (as of 05/2017 initial consult note).  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  Pt Factor V leiden heterozygote.  Stable as of 09/2017 hem f/u.   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  Pt referred for sleep study.   Chronic renal insufficiency, stage II (mild)    GFR 60s-70s (suspect HTN and NSAID damage as etiology).  GFR dipped into 50s fall 2018 after lots of NSAID use for chronic daily HA's.   Diverticulitis 2016   CT 01/2015   Frequent headaches    Gastric polyp 2006   GERD without esophagitis    Herpes labialis    Hiatal hernia    Hyperlipidemia    HYPERTENSION 01/05/2007   OSA (obstructive sleep apnea) 04/2017   Dr. Buck eval 04/2017-plan for sleep study.   PSORIASIS 01/05/2007   Past Surgical History:  Procedure Laterality Date   COLONOSCOPY W/ POLYPECTOMY  10/04/15   7mm tubular adenoma + diverticulosis: recall 5 yrs (Dr. Aneita)   ESOPHAGOGASTRODUODENOSCOPY  04/12/14   Barrett's esophagus   LIPOMA EXCISION     several, arm   Social History:  reports that he has never smoked. He has never used smokeless tobacco. He reports current alcohol  use of about 2.0 standard drinks of alcohol  per week. He reports that he does not  use drugs.  Allergies  Allergen Reactions   Bismuth Subsalicylate Swelling and Other (See Comments)   Compazine  [Prochlorperazine  Edisylate] Other (See Comments)    Cervical dystonia and panic attack    Family History  Problem Relation Age of Onset   Hypertension Mother    CAD Father        around age 28   Heart disease Father    Hyperlipidemia Father    Hypertension Father    Hypertension Brother    Hyperlipidemia Brother     Lung cancer Maternal Grandmother    Colon cancer Maternal Grandfather    Cancer Maternal Grandfather        type unknown   CAD Paternal Grandmother    Diabetes Paternal Grandmother    Esophageal cancer Neg Hx    Stomach cancer Neg Hx      Prior to Admission medications   Medication Sig Start Date End Date Taking? Authorizing Provider  acetaminophen  (TYLENOL ) 500 MG tablet Take 500-1,000 mg by mouth every 6 (six) hours as needed for moderate pain (pain score 4-6).    [provider]  buPROPion  (WELLBUTRIN  XL) 150 MG 24 hr tablet Take 450 mg by mouth daily.    [provider]  butalbital -acetaminophen -caffeine  (FIORICET ) 50-325-40 MG tablet Take 1 tablet by mouth every 4 (four) hours as needed for headache. 09/25/23   Samtani, Jai-Gurmukh, MD  diltiazem  (CARDIZEM  CD) 240 MG 24 hr capsule TAKE 1 CAPSULE BY MOUTH EVERY DAY 02/19/21   Patwardhan, Manish J, MD  esomeprazole  (NEXIUM ) 20 MG capsule Take 40 mg by mouth daily at 12 noon.    [provider]  fluconazole  (DIFLUCAN ) 150 MG tablet Take 150 mg by mouth once a week. Patient not taking: Reported on 10/21/2023    [provider]  HYDROcodone -acetaminophen  (NORCO/VICODIN) 5-325 MG tablet Take 1 tablet by mouth every 6 (six) hours as needed. 09/25/23   Samtani, Jai-Gurmukh, MD  losartan  (COZAAR ) 25 MG tablet Take 2 tablets (50 mg total) by mouth daily. Patient taking differently: Take 25 mg by mouth daily. 09/27/22 09/21/23  Ezenduka, Nkeiruka J, MD  ondansetron  (ZOFRAN -ODT) 4 MG disintegrating tablet Take 1 tablet (4 mg total) by mouth every 8 (eight) hours as needed for nausea or vomiting. 08/12/23   Schutt, Marsa HERO, PA-C  RABEprazole  (ACIPHEX ) 20 MG tablet Take 1 tablet (20 mg total) by mouth in the morning and at bedtime. 10/21/23   [name], Kavitha V, MD  rivaroxaban  (XARELTO ) 20 MG TABS tablet Take 1 tablet (20 mg total) by mouth daily with supper. 10/04/22   Yolande Lamar BROCKS, MD  rosuvastatin   (CRESTOR ) 40 MG tablet Take 1 tablet (40 mg total) by mouth once a week. 12/27/17   Gretta Ozell CROME, PA-C  tamsulosin  (FLOMAX ) 0.4 MG CAPS capsule Take 1 capsule (0.4 mg total) by mouth daily. 04/05/18   Wiseman, Brittany D, PA-C  tiZANidine  (ZANAFLEX ) 4 MG tablet Take 4 mg by mouth every 8 (eight) hours as needed for muscle spasms.    [provider]  valACYclovir  (VALTREX ) 500 MG tablet Take 1 tablet (500 mg total) by mouth daily. 12/30/17   Gretta Ozell CROME, PA-C  lisinopril -hydrochlorothiazide  (PRINZIDE ,ZESTORETIC ) 20-25 MG per tablet Take 1 tablet by mouth daily. 07/24/11 08/21/11  Jame Maude FALCON, MD    Physical Exam: BP (!) 155/115 (BP Location: Right Leg)   Pulse 99   Temp 98.7 F (37.1 C) (Oral)   Resp 18   Ht 5' 9 (1.753 m)   Wt 86.2  kg   SpO2 96%   BMI 28.06 kg/m  General: Pleasant, acutely ill-appearing middle-age man laying in bed. No acute distress. HEENT: [place]/AT. Anicteric sclera CV: RRR. No murmurs, rubs, or gallops. No LE edema Pulmonary: Lungs CTAB. Normal effort. No wheezing or rales. Abdominal: Soft, moderate LLQ tenderness. Normal bowel sounds. Extremities: Palpable radial and DP pulses. Normal ROM. Skin: Warm and dry. No obvious rash or lesions. Neuro: A&Ox3. Moves all extremities. Normal sensation to light touch. No focal deficit. Psych: Normal mood and affect          Labs on Admission:  Basic Metabolic Panel: Recent Labs  Lab 03/08/24 2028  NA 139  K 4.0  CL 102  CO2 20*  GLUCOSE 162*  BUN 16  CREATININE 1.21  CALCIUM  9.1   Liver Function Tests: Recent Labs  Lab 03/08/24 2028  AST 25  ALT 15  ALKPHOS 78  BILITOT 0.3  PROT 7.2  ALBUMIN 4.5   Recent Labs  Lab 03/08/24 2028  LIPASE 12   No results for input(s): AMMONIA in the last 168 hours. CBC: Recent Labs  Lab 03/08/24 2028  WBC 15.2*  HGB 15.0  HCT 42.5  MCV 88.4  PLT 365   Cardiac Enzymes: No results for input(s): CKTOTAL, CKMB, CKMBINDEX, TROPONINI  in the last 168 hours. BNP (last 3 results) No results for input(s): BNP in the last 8760 hours.  ProBNP (last 3 results) No results for input(s): PROBNP in the last 8760 hours.  CBG: No results for input(s): GLUCAP in the last 168 hours.  Radiological Exams on Admission: CT ABDOMEN PELVIS W CONTRAST Result Date: 03/08/2024 CLINICAL DATA:  Abdominal pain.  History of diverticulitis. EXAM: CT ABDOMEN AND PELVIS WITH CONTRAST TECHNIQUE: Multidetector CT imaging of the abdomen and pelvis was performed using the standard protocol following bolus administration of intravenous contrast. RADIATION DOSE REDUCTION: This exam was performed according to the departmental dose-optimization program which includes automated exposure control, adjustment of the mA and/or kV according to patient size and/or use of iterative reconstruction technique. CONTRAST:  OMNIPAQUE  IOHEXOL  300 MG/ML  SOLN COMPARISON:  CT scan 10/19/2023 FINDINGS: Lower chest: The lung bases are clear of acute process. No pleural effusion or pulmonary lesions. The heart is normal in size. No pericardial effusion. The distal esophagus and aorta are unremarkable. Hepatobiliary: No focal liver abnormality is seen. No gallstones, gallbladder wall thickening, or biliary dilatation. Pancreas: Unremarkable. No pancreatic ductal dilatation or surrounding inflammatory changes. Spleen: Normal in size without focal abnormality. Adrenals/Urinary Tract: The adrenal glands are normal. Bilateral renal calculi noted but no hydroureteronephrosis to suggest obstructing ureteral calculus. The bladder is unremarkable. No renal lesions or findings to suggest pyelonephritis. Stomach/Bowel: The stomach, duodenum and small bowel are unremarkable. No acute inflammatory process or obstructive findings. The terminal ileum is normal. The appendix is normal. Changes of acute diverticulitis involving the mid sigmoid colon with marked submucosal edema and possible rim  enhancing intramural abscess. No free air, free fluid or free pelvic abscess. Stable underlying changes of diverticulosis. Vascular/Lymphatic: The aorta and branch vessels are patent. Scattered atherosclerotic calcifications. The major venous structures are patent. Small scattered mesenteric and retroperitoneal lymph nodes but no mass or adenopathy. Reproductive: The prostate gland and seminal vesicles are unremarkable. Other: Small periumbilical abdominal wall hernia containing fat. No subcutaneous lesions. No inguinal hernia or adenopathy. Musculoskeletal: No significant bony findings. IMPRESSION: 1. Acute diverticulitis involving the mid sigmoid colon with marked submucosal edema and possible rim enhancing intramural abscess. No  free air, free fluid or free pelvic abscess. 2. Bilateral renal calculi but no obstructing ureteral calculus. 3. Small periumbilical abdominal wall hernia containing fat. Electronically Signed   By: MYRTIS Stammer M.D.   On: 03/08/2024 21:44   Assessment/Plan Benjamin [name] is a 53 y.o. male with medical history significant for recurrent diverticulitis last in 4/'25, Previous DVT on Xarelto  in the setting of factor V Leiden HTN, HLD, migraines, BPH, reflux, p/w abd pain and admitted for recurrent diverticulitis.  # Recurrent diverticulitis - Patient with a history of diverticulitis presenting with 1 day of worsening left lower quadrant pain with associated nausea - CT A/P shows acute diverticulitis involving the mid sigmoid colon with marked submucosal edema and possible ring-enhancing intramural abscess - Remains afebrile without leukocytosis but significantly abdominal tenderness on exam - Colonoscopy in 12/2022 showed moderate diverticulitis in the left colon and a 5 mm polyp that was resected - GI consulted, appreciate recs - Continue IV Zosyn  - Continue IV NS at 100 cc/h - Pain control with as needed Norco and IV Dilaudid  - Patient follows with [facility] surgery at [facility],  advised to follow-up at discharge  # HTN - BP elevated with SBP in the 140s to 150s  - Continue losartan  and diltiazem   # Hx of DVT # Hx of Factor V Leiden - Continue Xarelto   # HLD - Continue Crestor   # HLD - Continue rosuvastatin   # Hx of migraines - As needed Fioricet   # Anxiety and depression - Continue Wellbutrin   # BPH - Continue Flomax   DVT prophylaxis: Xarelto     Code Status: Full Code  Consults called: GI  Family Communication: Discussed admission with mother at bedside  Severity of Illness: The appropriate patient status for this patient is INPATIENT. Inpatient status is judged to be reasonable and necessary in order to provide the required intensity of service to ensure the patient's safety. The patient's presenting symptoms, physical exam findings, and initial radiographic and laboratory data in the context of their chronic comorbidities is felt to place them at high risk for further clinical deterioration. Furthermore, it is not anticipated that the patient will be medically stable for discharge from the hospital within 2 midnights of admission.   * I certify that at the point of admission it is my clinical judgment that the patient will require inpatient hospital care spanning beyond 2 midnights from the point of admission due to high intensity of service, high risk for further deterioration and high frequency of surveillance required.*  Level of care: Med-Surg    Lou Claretta HERO, MD 03/09/2024, 8:05 AM [facility] Pager: [phone] Isaiah 41:10   If 7PM-7AM, please contact night-coverage [email or web address] Password [email or web address]

## 2024-03-09 NOTE — Consult Note (Addendum)
 Referring Provider: Dr. Lou, TRH Primary Care Physician:  Gretta Ozell CROME, PA-C Primary Gastroenterologist:  Dr. Aneita, saw [name] last as inpatient  Reason for Consultation:  Diverticulitis  HPI: Benjamin [name] is a 53 y.o. male with medical history significant for recurrent diverticulitis last in 4/'25, Previous DVT on Xarelto  in the setting of factor V Leiden, HTN, HLD, migraines, BPH, reflux..  Patient reports he started having left lower quadrant pain yesterday morning described as stabbing, radiating to the base of his abdomen. He endorsed associated nausea but denies any vomiting, fevers, chills, diarrhea, constipation, shortness of breath or chest pain.  He is currently followed by surgery at [facility] ([facility]) and has plans to follow-up but has not done this yet as her special needs daughter had to have surgery out of state in the [place].  He denies having any issues with constipation.  Had a BM yesterday afternoon.  White blood cell count 15.2 K.  Has been started on Zosyn .  CT scan abdomen and pelvis with contrast: IMPRESSION: 1. Acute diverticulitis involving the mid sigmoid colon with marked submucosal edema and possible rim enhancing intramural abscess. No free air, free fluid or free pelvic abscess. 2. Bilateral renal calculi but no obstructing ureteral calculus. 3. Small periumbilical abdominal wall hernia containing fat.    Colonoscopy in August 2024 with removal of one 5 mm polyp from the transverse colon and was noted to have multiple medium and small diverticuli in the left colon with evidence of diverticular spasm Polyp was a tubular adenoma.   Past Medical History:  Diagnosis Date   Acute DVT of left tibial vein (HCC) 04/24/2017   Xarelto  started (dx'd in emergency dept).  Dr. Timmy recommended full dose xarelto  x 32mo, with an additional year of maintenance dose xarelto  (as of 05/2017 initial consult note).  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  Pt Factor V leiden heterozygote.  Stable as of 09/2017 hem f/u.   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  Pt referred for sleep study.   Chronic renal insufficiency, stage II (mild)    GFR 60s-70s (suspect HTN and NSAID damage as etiology).  GFR dipped into 50s fall 2018 after lots of NSAID use for chronic daily HA's.   Diverticulitis 2016   CT 01/2015   Frequent headaches    Gastric polyp 2006   GERD without esophagitis    Herpes labialis    Hiatal hernia    Hyperlipidemia    HYPERTENSION 01/05/2007   OSA (obstructive sleep apnea) 04/2017   Dr. Buck eval 04/2017-plan for sleep study.   PSORIASIS 01/05/2007    Past Surgical History:  Procedure Laterality Date   COLONOSCOPY W/ POLYPECTOMY  10/04/15   7mm tubular adenoma + diverticulosis: recall 5 yrs (Dr. Aneita)   ESOPHAGOGASTRODUODENOSCOPY  04/12/14   Barrett's esophagus   LIPOMA EXCISION     several, arm  Prior to Admission medications   Medication Sig Start Date End Date Taking? Authorizing Provider  acetaminophen  (TYLENOL ) 500 MG tablet Take 500-1,000 mg by mouth every 6 (six) hours as needed for moderate pain (pain score 4-6).    [provider]  buPROPion  (WELLBUTRIN  XL) 150 MG 24 hr tablet Take 450 mg by mouth daily.    [provider]  butalbital -acetaminophen -caffeine  (FIORICET ) 50-325-40 MG tablet Take 1 tablet by mouth  every 4 (four) hours as needed for headache. 09/25/23   Samtani, Jai-Gurmukh, MD  diltiazem  (CARDIZEM  CD) 240 MG 24 hr capsule TAKE 1 CAPSULE BY MOUTH EVERY DAY 02/19/21   Patwardhan, Manish J, MD  esomeprazole  (NEXIUM ) 20 MG capsule Take 40 mg by mouth daily at 12 noon.    [provider]  fluconazole  (DIFLUCAN ) 150 MG tablet Take 150 mg by mouth once a week. Patient not taking: Reported on 10/21/2023    [provider]  HYDROcodone -acetaminophen  (NORCO/VICODIN) 5-325 MG tablet Take 1 tablet by mouth every 6 (six) hours as needed. 09/25/23   Samtani, Jai-Gurmukh, MD  losartan  (COZAAR ) 25 MG tablet Take 2 tablets (50 mg total) by mouth daily. Patient taking differently: Take 25 mg by mouth daily. 09/27/22 09/21/23  Ezenduka, Nkeiruka J, MD  ondansetron  (ZOFRAN -ODT) 4 MG disintegrating tablet Take 1 tablet (4 mg total) by mouth every 8 (eight) hours as needed for nausea or vomiting. 08/12/23   Schutt, Marsa HERO, PA-C  RABEprazole  (ACIPHEX ) 20 MG tablet Take 1 tablet (20 mg total) by mouth in the morning and at bedtime. 10/21/23   [name], Kavitha V, MD  rivaroxaban  (XARELTO ) 20 MG TABS tablet Take 1 tablet (20 mg total) by mouth daily with supper. 10/04/22   Yolande Lamar BROCKS, MD  rosuvastatin  (CRESTOR ) 40 MG tablet Take 1 tablet (40 mg total) by mouth once a week. 12/27/17   Gretta Ozell CROME, PA-C  tamsulosin  (FLOMAX ) 0.4 MG CAPS capsule Take 1 capsule (0.4 mg total) by mouth daily. 04/05/18   Wiseman, Brittany D, PA-C  tiZANidine  (ZANAFLEX ) 4 MG tablet Take 4 mg by mouth every 8 (eight) hours as needed for muscle spasms.    [provider]  valACYclovir  (VALTREX ) 500 MG tablet Take 1 tablet (500 mg total) by mouth daily. 12/30/17   Gretta Ozell CROME, PA-C  lisinopril -hydrochlorothiazide  (PRINZIDE ,ZESTORETIC ) 20-25 MG per tablet Take 1 tablet by mouth daily. 07/24/11 08/21/11  Jame Maude FALCON, MD    Current Facility-Administered Medications  Medication Dose Route Frequency  Provider Last Rate Last Admin   0.9 %  sodium chloride  infusion   Intravenous Continuous Segars, Jonathan, MD 100 mL/hr at 03/09/24 0218 New Bag at 03/09/24 0218   acetaminophen  (TYLENOL ) tablet 650 mg  650 mg Oral Q6H PRN Amponsah, Prosper M, MD       Or   acetaminophen  (TYLENOL ) suppository 650 mg  650 mg Rectal Q6H PRN Amponsah, Prosper M, MD       bisacodyl (DULCOLAX) EC tablet 5 mg  5 mg Oral Daily PRN Amponsah, Prosper M, MD       buPROPion  (WELLBUTRIN  XL) 24 hr tablet 450 mg  450 mg Oral Daily Amponsah, Prosper M, MD   450 mg at 03/09/24 0750   butalbital -acetaminophen -caffeine  (FIORICET ) 50-325-40 MG per tablet 1 tablet  1 tablet Oral Q6H PRN Lou Claretta HERO, MD   1 tablet at 03/09/24 0749   diltiazem  (CARDIZEM  CD) 24 hr capsule 240 mg  240 mg Oral Daily Amponsah, Prosper M, MD   240 mg at  03/09/24 0749   HYDROcodone -acetaminophen  (NORCO/VICODIN) 5-325 MG per tablet 1 tablet  1 tablet Oral Q6H PRN Lou Claretta HERO, MD       HYDROmorphone  (DILAUDID ) injection 1 mg  1 mg Intravenous Q4H PRN Amponsah, Prosper M, MD   1 mg at 03/09/24 0754   losartan  (COZAAR ) tablet 25 mg  25 mg Oral Daily Lou Claretta HERO, MD   25 mg at 03/09/24 9251   ondansetron  (ZOFRAN ) tablet 4 mg  4 mg Oral Q6H PRN Lou Claretta HERO, MD       Or   ondansetron  (ZOFRAN ) injection 4 mg  4 mg Intravenous Q6H PRN Lou Claretta HERO, MD       piperacillin -tazobactam (ZOSYN ) IVPB 3.375 g  3.375 g Intravenous Q8H Segars, Jonathan, MD 12.5 mL/hr at 03/09/24 0219 3.375 g at 03/09/24 9780   rivaroxaban  (XARELTO ) tablet 20 mg  20 mg Oral Q supper Lou Claretta HERO, MD       [START ON 03/10/2024] rosuvastatin  (CRESTOR ) tablet 40 mg  40 mg Oral Q7 days Lou Claretta HERO, MD   40 mg at 03/09/24 0749   senna-docusate (Senokot-S) tablet 1 tablet  1 tablet Oral QHS PRN Lou Claretta HERO, MD       tamsulosin  (FLOMAX ) capsule 0.4 mg  0.4 mg Oral Daily Lou Claretta HERO, MD   0.4 mg at 03/09/24 0749   tiZANidine   (ZANAFLEX ) tablet 4 mg  4 mg Oral Q8H PRN Lou Claretta HERO, MD       valACYclovir  (VALTREX ) tablet 500 mg  500 mg Oral Daily Lou Claretta HERO, MD   500 mg at 03/09/24 9250   Facility-Administered Medications Ordered in Other Encounters  Medication Dose Route Frequency Provider Last Rate Last Admin   gadopentetate dimeglumine  (MAGNEVIST ) injection 18 mL  18 mL Intravenous Once PRN Ines Onetha NOVAK, MD        Allergies as of 03/08/2024 - Review Complete 03/08/2024  Allergen Reaction Noted   Bismuth subsalicylate Swelling and Other (See Comments) 08/09/2015   Compazine  [prochlorperazine  edisylate] Other (See Comments) 03/24/2017    Family History  Problem Relation Age of Onset   Hypertension Mother    CAD Father        around age 10   Heart disease Father    Hyperlipidemia Father    Hypertension Father    Hypertension Brother    Hyperlipidemia Brother    Lung cancer Maternal Grandmother    Colon cancer Maternal Grandfather    Cancer Maternal Grandfather        type unknown   CAD Paternal Grandmother    Diabetes Paternal Grandmother    Esophageal cancer Neg Hx    Stomach cancer Neg Hx     Social History   Socioeconomic History   Marital status: Single    Spouse name: Not on file   Number of children: 3   Years of education: Not on file   Highest education level: Not on file  Occupational History   Occupation: regional Mudlogger    Comment: Industrial/product designer  Tobacco Use   Smoking status: Never   Smokeless tobacco: Never  Vaping Use   Vaping status: Never Used  Substance and Sexual Activity   Alcohol  use: Yes    Alcohol /week: 2.0 standard drinks of alcohol     Types: 2 Cans of beer per week    Comment: rare   Drug use: No   Sexual activity: Yes    Birth control/protection: None  Other Topics Concern  Not on file  Social History Narrative   Currently separated as of 01/2014, 2 children.  One younger brother.   Occupation: Airline pilot for [employer]    No tobacco.   Occ alcohol  (1-2 x/month).  No hx of alc/drug prob.   Exercise: runs about 2 times a week. Not as much lately due to medical state   Diet: regular american diet.   Right handed   1 cup coffee daily      Social Drivers of Health   Financial Resource Strain: Low Risk  (10/15/2023)   Received from [facility]   Overall Financial Resource Strain (CARDIA)    Difficulty of Paying Living Expenses: Not very hard  Food Insecurity: No Food Insecurity (03/09/2024)   Hunger Vital Sign    Worried About Running Out of Food in the Last Year: Never true    Ran Out of Food in the Last Year: Never true  Transportation Needs: No Transportation Needs (03/09/2024)   PRAPARE - Administrator, Civil Service (Medical): No    Lack of Transportation (Non-Medical): No  Physical Activity: Not on file  Stress: Not on file  Social Connections: Unknown (03/09/2024)   Social Connection and Isolation Panel    Frequency of Communication with Friends and Family: More than three times a week    Frequency of Social Gatherings with Friends and Family: Once a week    Attends Religious Services: Not on Marketing executive or Organizations: No    Attends Banker Meetings: Never    Marital Status: Married  Catering manager Violence: Not At Risk (03/09/2024)   Humiliation, Afraid, Rape, and Kick questionnaire    Fear of Current or Ex-Partner: No    Emotionally Abused: No    Physically Abused: No    Sexually Abused: No    Review of Systems: ROS is O/W negative except as mentioned in HPI.  Physical Exam: Vital signs in last 24 hours: Temp:  [97.9 F (36.6 C)-98.7 F (37.1 C)] 98.7 F (37.1 C) (10/09 0622) Pulse Rate:  [90-99] 99 (10/09 0622) Resp:  [17-20] 18 (10/09 0622) BP: (145-160)/(108-117) 155/115 (10/09 0622) SpO2:  [93 %-99 %] 96 % (10/09 0622) Weight:  [86.2 kg] 86.2 kg (10/08 1959) Last BM Date : 03/08/24 General:   Alert,   Well-developed, well-nourished, pleasant and cooperative in NAD Head:  Normocephalic and atraumatic. Eyes:  Sclera clear, no icterus.  Conjunctiva pink. Ears:  Normal auditory acuity. Mouth:  No deformity or lesions.   Lungs:  Clear throughout to auscultation.   No wheezes, crackles, or rhonchi.  Heart:  Regular rate and rhythm; no murmurs, clicks, rubs,  or gallops. Abdomen:  Soft,nontender, BS active,nonpalp mass or hsm.   Rectal:  Deferred  Msk:  Symmetrical without gross deformities. Pulses:  Normal pulses noted. Extremities:  Without clubbing or edema. Neurologic:  Alert and  oriented x4;  grossly normal neurologically. Skin:  Intact without significant lesions or rashes. Psych:  Alert and cooperative. Normal mood and affect.  Intake/Output from previous day: 10/08 0701 - 10/09 0700 In: 123.1 [I.V.:109.4; IV Piggyback:13.8] Out: 750 [Urine:750]  Lab Results: Recent Labs    03/08/24 2028  WBC 15.2*  HGB 15.0  HCT 42.5  PLT 365   BMET Recent Labs    03/08/24 2028  NA 139  K 4.0  CL 102  CO2 20*  GLUCOSE 162*  BUN 16  CREATININE 1.21  CALCIUM  9.1  LFT Recent Labs    03/08/24 2028  PROT 7.2  ALBUMIN 4.5  AST 25  ALT 15  ALKPHOS 78  BILITOT 0.3   Studies/Results: CT ABDOMEN PELVIS W CONTRAST Result Date: 03/08/2024 CLINICAL DATA:  Abdominal pain.  History of diverticulitis. EXAM: CT ABDOMEN AND PELVIS WITH CONTRAST TECHNIQUE: Multidetector CT imaging of the abdomen and pelvis was performed using the standard protocol following bolus administration of intravenous contrast. RADIATION DOSE REDUCTION: This exam was performed according to the departmental dose-optimization program which includes automated exposure control, adjustment of the mA and/or kV according to patient size and/or use of iterative reconstruction technique. CONTRAST:  OMNIPAQUE  IOHEXOL  300 MG/ML  SOLN COMPARISON:  CT scan 10/19/2023 FINDINGS: Lower chest: The lung bases are clear of acute  process. No pleural effusion or pulmonary lesions. The heart is normal in size. No pericardial effusion. The distal esophagus and aorta are unremarkable. Hepatobiliary: No focal liver abnormality is seen. No gallstones, gallbladder wall thickening, or biliary dilatation. Pancreas: Unremarkable. No pancreatic ductal dilatation or surrounding inflammatory changes. Spleen: Normal in size without focal abnormality. Adrenals/Urinary Tract: The adrenal glands are normal. Bilateral renal calculi noted but no hydroureteronephrosis to suggest obstructing ureteral calculus. The bladder is unremarkable. No renal lesions or findings to suggest pyelonephritis. Stomach/Bowel: The stomach, duodenum and small bowel are unremarkable. No acute inflammatory process or obstructive findings. The terminal ileum is normal. The appendix is normal. Changes of acute diverticulitis involving the mid sigmoid colon with marked submucosal edema and possible rim enhancing intramural abscess. No free air, free fluid or free pelvic abscess. Stable underlying changes of diverticulosis. Vascular/Lymphatic: The aorta and branch vessels are patent. Scattered atherosclerotic calcifications. The major venous structures are patent. Small scattered mesenteric and retroperitoneal lymph nodes but no mass or adenopathy. Reproductive: The prostate gland and seminal vesicles are unremarkable. Other: Small periumbilical abdominal wall hernia containing fat. No subcutaneous lesions. No inguinal hernia or adenopathy. Musculoskeletal: No significant bony findings. IMPRESSION: 1. Acute diverticulitis involving the mid sigmoid colon with marked submucosal edema and possible rim enhancing intramural abscess. No free air, free fluid or free pelvic abscess. 2. Bilateral renal calculi but no obstructing ureteral calculus. 3. Small periumbilical abdominal wall hernia containing fat. Electronically Signed   By: MYRTIS Stammer M.D.   On: 03/08/2024 21:44   IMPRESSION:   *Recurrent diverticulitis:  CT A/P shows acute diverticulitis involving the mid sigmoid colon with marked submucosal edema and possible ring-enhancing intramural abscess.  *Leukocytosis: White blood cell count 15.2 K secondary to diverticulitis. *History of DVTs due to factor V Leiden on Xarelto   PLAN: - Supportive care with IV hydration, bowel rest, antibiotics.  He is on Zosyn .  Will allow clear liquids. - Just had a colonoscopy August 2024. - No further input from a GI standpoint.  If he fails to improve then needs to see surgery as an inpatient.  Otherwise we will follow-up with them as an outpatient.   Harlene BIRCH. Zehr  03/09/2024, 9:13 AM  GI ATTENDING  History, laboratories, x-rays, prior colonoscopy report all personally reviewed.  Agree with comprehensive consultation note as outlined above.  Impression: 1.  Recurrent complicated diverticulitis.  Recommendations: 1.  Antibiotics 2.  Pain control 3.  Liquid diet okay 4.  Consult surgery.  This patient will most certainly need surgery, it is a matter of the optimal timing.  Will defer this to surgery, of course.  GI will sign off.  Norleen SAILOR. Abran Raddle., M.D. [facility] Division of Gastroenterology.

## 2024-03-09 NOTE — Plan of Care (Signed)
 ?  Problem: Clinical Measurements: ?Goal: Will remain free from infection ?Outcome: Progressing ?  ?Problem: Clinical Measurements: ?Goal: Diagnostic test results will improve ?Outcome: Progressing ?  ?

## 2024-03-09 NOTE — Progress Notes (Signed)
   03/09/24 1529  TOC Brief Assessment  Insurance and Status Reviewed  Patient has primary care physician Yes  Home environment has been reviewed home with spouse  Prior level of function: independent  Prior/Current Home Services No current home services  Social Drivers of Health Review SDOH reviewed no interventions necessary  Readmission risk has been reviewed Yes  Transition of care needs no transition of care needs at this time

## 2024-03-10 DIAGNOSIS — K5720 Diverticulitis of large intestine with perforation and abscess without bleeding: Secondary | ICD-10-CM | POA: Diagnosis not present

## 2024-03-10 DIAGNOSIS — K5792 Diverticulitis of intestine, part unspecified, without perforation or abscess without bleeding: Secondary | ICD-10-CM | POA: Diagnosis not present

## 2024-03-10 DIAGNOSIS — Z862 Personal history of diseases of the blood and blood-forming organs and certain disorders involving the immune mechanism: Secondary | ICD-10-CM | POA: Diagnosis not present

## 2024-03-10 LAB — BASIC METABOLIC PANEL WITH GFR
Anion gap: 11 (ref 5–15)
BUN: 9 mg/dL (ref 6–20)
CO2: 24 mmol/L (ref 22–32)
Calcium: 9.2 mg/dL (ref 8.9–10.3)
Chloride: 102 mmol/L (ref 98–111)
Creatinine, Ser: 0.99 mg/dL (ref 0.61–1.24)
GFR, Estimated: >60 mL/min (ref >60)
Glucose, Bld: 104 mg/dL — ABNORMAL HIGH (ref 70–99)
Potassium: 3.5 mmol/L (ref 3.5–5.1)
Sodium: 137 mmol/L (ref 135–145)

## 2024-03-10 LAB — CBC
HCT: 43.4 % (ref 39.0–52.0)
Hemoglobin: 14.2 g/dL (ref 13.0–17.0)
MCH: 29.7 pg (ref 26.0–34.0)
MCHC: 32.7 g/dL (ref 30.0–36.0)
MCV: 90.8 fL (ref 80.0–100.0)
Platelets: 330 K/uL (ref 150–400)
RBC: 4.78 MIL/uL (ref 4.22–5.81)
RDW: 12.1 % (ref 11.5–15.5)
WBC: 11.1 K/uL — ABNORMAL HIGH (ref 4.0–10.5)
nRBC: 0 % (ref 0.0–0.2)

## 2024-03-10 LAB — HIV ANTIBODY (ROUTINE TESTING W REFLEX): HIV Screen 4th Generation wRfx: NONREACTIVE

## 2024-03-10 MED ORDER — HYDROCODONE-ACETAMINOPHEN 5-325 MG PO TABS
1.0000 | ORAL_TABLET | ORAL | Status: DC | PRN
  Administered 2024-03-10 – 2024-03-12 (×5): 1 via ORAL
  Filled 2024-03-10 (×5): qty 1

## 2024-03-10 NOTE — Progress Notes (Signed)
 [facility] PROGRESS NOTE    Progress Note  Benjamin [name]  FMW:[id number] DOB: 29-Oct-1970 DOA: 03/08/2024 PCP: Gretta Ozell CROME, PA-C     Brief Narrative:   Benjamin [name] is an 53 y.o. 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  In the ED CT scan of the abdomen pelvis showed acute diverticulitis of the sigmoid colon with possible intramural abscess.   Assessment/Plan:   Recurrent acute Diverticulitis No fever.  Leukocytosis started empirically on IV Zosyn . Leukocytosis resolved. Continue narcotics for pain control, started on a bowel regimen.  Essential hypertension: Continue losartan  and diltiazem .  History of DVT/factor V Leyden: Continue Xarelto .  Hyperlipidemia:  Continue statins.  History of migraine: Continue Fioricet .  Anxiety  and depression: Continue Wellbutrin .  Benign prostatic hyperplasia Continue Flomax .  DVT prophylaxis: lovenox  Family Communication:none Status is: Inpatient Remains inpatient appropriate because: Recurrent acute diverticulitis    Code Status:     Code Status Orders  (From admission, onward)           Start     Ordered   03/09/24 0754  Full code  Continuous       Question:  By:  Answer:  Consent: discussion documented in EHR   03/09/24 0754           Code Status History     Date Active Date Inactive Code Status Order ID Comments User Context   09/21/2023 0617 09/25/2023 1713 Full Code [id number]  Marcene Eva NOVAK DO Inpatient   09/22/2022 2133 09/27/2022 1653 Full Code [id number]  Zella Katha HERO, MD Inpatient   10/09/2019 1809 10/12/2019 1628 Full Code [id number]  Sonjia Held, MD Inpatient         IV Access:   Peripheral IV   Procedures and diagnostic studies:    CT ABDOMEN PELVIS W CONTRAST Result Date: 03/08/2024 CLINICAL DATA:  Abdominal pain.  History of diverticulitis. EXAM: CT ABDOMEN AND PELVIS WITH CONTRAST TECHNIQUE: Multidetector CT imaging of the abdomen and pelvis was performed using the standard protocol following bolus administration of intravenous contrast. RADIATION DOSE REDUCTION: This exam was performed according to the departmental dose-optimization program which includes automated exposure control, adjustment of the mA and/or kV according to patient size and/or use of iterative reconstruction technique. CONTRAST:  OMNIPAQUE  IOHEXOL  300 MG/ML  SOLN COMPARISON:  CT scan 10/19/2023 FINDINGS: Lower chest: The lung bases are clear of acute process. No pleural effusion or pulmonary lesions. The heart is normal in size. No pericardial effusion. The distal esophagus and aorta are unremarkable. Hepatobiliary: No focal liver abnormality is seen. No gallstones, gallbladder wall thickening, or biliary dilatation. Pancreas: Unremarkable. No pancreatic ductal dilatation or surrounding inflammatory changes. Spleen: Normal in size without focal abnormality. Adrenals/Urinary Tract: The adrenal glands are normal. Bilateral renal calculi noted but no hydroureteronephrosis to suggest obstructing ureteral calculus. The bladder is unremarkable. No renal lesions or findings to suggest pyelonephritis. Stomach/Bowel: The stomach, duodenum and small bowel are unremarkable. No acute inflammatory process or obstructive findings. The terminal ileum is normal. The appendix is normal. Changes of acute diverticulitis involving the mid sigmoid colon with marked submucosal edema and possible rim enhancing intramural abscess. No free air, free fluid or free pelvic abscess. Stable underlying changes of diverticulosis. Vascular/Lymphatic: The aorta and branch vessels are patent. Scattered atherosclerotic  calcifications. The major venous structures are patent. Small scattered  mesenteric and retroperitoneal lymph nodes but no mass or adenopathy. Reproductive: The prostate gland and seminal vesicles are unremarkable. Other: Small periumbilical abdominal wall hernia containing fat. No subcutaneous lesions. No inguinal hernia or adenopathy. Musculoskeletal: No significant bony findings. IMPRESSION: 1. Acute diverticulitis involving the mid sigmoid colon with marked submucosal edema and possible rim enhancing intramural abscess. No free air, free fluid or free pelvic abscess. 2. Bilateral renal calculi but no obstructing ureteral calculus. 3. Small periumbilical abdominal wall hernia containing fat. Electronically Signed   By: MYRTIS Stammer M.D.   On: 03/08/2024 21:44     Medical Consultants:   None.   Subjective:    [name] relates his pain is controlled, would like his diet advanced.  Objective:    Vitals:   03/09/24 1253 03/09/24 1702 03/09/24 2149 03/10/24 0523  BP: (!) 141/103 (!) 132/103 (!) 152/109 (!) 152/102  Pulse: 83 85 85 80  Resp: 17 16 15 15   Temp: (!) 97.5 F (36.4 C) 97.6 F (36.4 C) 97.7 F (36.5 C) 97.8 F (36.6 C)  TempSrc: Oral Oral Oral Oral  SpO2: 95% 95% 99% 97%  Weight:      Height:       SpO2: 97 %   Intake/Output Summary (Last 24 hours) at 03/10/2024 0909 Last data filed at 03/10/2024 0636 Gross per 24 hour  Intake 2500.35 ml  Output 1550 ml  Net 950.35 ml   Filed Weights   03/08/24 1959  Weight: 86.2 kg    Exam: General exam: In no acute distress. Respiratory system: Good air movement and clear to auscultation. Cardiovascular system: S1 & S2 heard, RRR. No JVD. Gastrointestinal system: Abdomen is nondistended, soft and nontender.  Extremities: No pedal edema. Skin: No rashes, lesions or ulcers Psychiatry: Judgement and insight appear normal. Mood & affect appropriate.    Data Reviewed:    Labs: Basic Metabolic Panel: Recent Labs  Lab 03/08/24 2028 03/10/24 0443  NA 139 137  K 4.0 3.5  CL  102 102  CO2 20* 24  GLUCOSE 162* 104*  BUN 16 9  CREATININE 1.21 0.99  CALCIUM  9.1 9.2   GFR Estimated Creatinine Clearance: 92.8 mL/min (by C-G formula based on SCr of 0.99 mg/dL). Liver Function Tests: Recent Labs  Lab 03/08/24 2028  AST 25  ALT 15  ALKPHOS 78  BILITOT 0.3  PROT 7.2  ALBUMIN 4.5   Recent Labs  Lab 03/08/24 2028  LIPASE 12   No results for input(s): AMMONIA in the last 168 hours. Coagulation profile No results for input(s): INR, PROTIME in the last 168 hours. COVID-19 Labs  No results for input(s): DDIMER, FERRITIN, LDH, CRP in the last 72 hours.  Lab Results  Component Value Date   SARSCOV2NAA NEGATIVE 08/12/2023   SARSCOV2NAA NEGATIVE 10/09/2019    CBC: Recent Labs  Lab 03/08/24 2028 03/10/24 0443  WBC 15.2* 11.1*  HGB 15.0 14.2  HCT 42.5 43.4  MCV 88.4 90.8  PLT 365 330   Cardiac Enzymes: No results for input(s): CKTOTAL, CKMB, CKMBINDEX, TROPONINI in the last 168 hours. BNP (last 3 results) No results for input(s): PROBNP in the last 8760 hours. CBG: No results for input(s): GLUCAP in the last 168 hours. D-Dimer: No results for input(s): DDIMER in the last 72 hours. Hgb A1c: No results for input(s): HGBA1C in the last 72 hours. Lipid Profile: No results for input(s): CHOL, HDL, LDLCALC, TRIG, CHOLHDL, LDLDIRECT in  the last 72 hours. Thyroid  function studies: No results for input(s): TSH, T4TOTAL, T3FREE, THYROIDAB in the last 72 hours.  Invalid input(s): FREET3 Anemia work up: No results for input(s): VITAMINB12, FOLATE, FERRITIN, TIBC, IRON, RETICCTPCT in the last 72 hours. Sepsis Labs: Recent Labs  Lab 03/08/24 2028 03/10/24 0443  WBC 15.2* 11.1*   Microbiology No results found for this or any previous visit (from the past 240 hours).   Medications:    buPROPion   450 mg Oral Daily   diltiazem   240 mg Oral Daily   influenza vac split trivalent PF   0.5 mL Intramuscular Tomorrow-1000   losartan   25 mg Oral Daily   pantoprazole   40 mg Oral BID   rivaroxaban   20 mg Oral Q supper   rosuvastatin   40 mg Oral Q7 days   tamsulosin   0.4 mg Oral Daily   valACYclovir   500 mg Oral Daily   Continuous Infusions:  sodium chloride  100 mL/hr at 03/09/24 1353   piperacillin -tazobactam (ZOSYN )  IV 3.375 g (03/10/24 0542)      LOS: 1 day   Erle Odell Castor  [facility]  03/10/2024, 9:09 AM

## 2024-03-11 DIAGNOSIS — K5792 Diverticulitis of intestine, part unspecified, without perforation or abscess without bleeding: Secondary | ICD-10-CM | POA: Diagnosis not present

## 2024-03-11 DIAGNOSIS — K5720 Diverticulitis of large intestine with perforation and abscess without bleeding: Secondary | ICD-10-CM | POA: Diagnosis not present

## 2024-03-11 DIAGNOSIS — Z862 Personal history of diseases of the blood and blood-forming organs and certain disorders involving the immune mechanism: Secondary | ICD-10-CM | POA: Diagnosis not present

## 2024-03-11 LAB — CBC
HCT: 48.4 % (ref 39.0–52.0)
Hemoglobin: 15.6 g/dL (ref 13.0–17.0)
MCH: 30.1 pg (ref 26.0–34.0)
MCHC: 32.2 g/dL (ref 30.0–36.0)
MCV: 93.3 fL (ref 80.0–100.0)
Platelets: 347 K/uL (ref 150–400)
RBC: 5.19 MIL/uL (ref 4.22–5.81)
RDW: 12.2 % (ref 11.5–15.5)
WBC: 9.7 K/uL (ref 4.0–10.5)
nRBC: 0 % (ref 0.0–0.2)

## 2024-03-11 LAB — HIV ANTIBODY (ROUTINE TESTING W REFLEX): HIV Screen 4th Generation wRfx: NONREACTIVE

## 2024-03-11 MED ORDER — AMOXICILLIN-POT CLAVULANATE 875-125 MG PO TABS
1.0000 | ORAL_TABLET | Freq: Two times a day (BID) | ORAL | Status: DC
  Administered 2024-03-11 – 2024-03-12 (×3): 1 via ORAL
  Filled 2024-03-11 (×3): qty 1

## 2024-03-11 MED ORDER — POLYETHYLENE GLYCOL 3350 17 G PO PACK
17.0000 g | PACK | Freq: Two times a day (BID) | ORAL | Status: DC
  Administered 2024-03-11 – 2024-03-12 (×2): 17 g via ORAL
  Filled 2024-03-11 (×3): qty 1

## 2024-03-11 NOTE — Progress Notes (Signed)
 [facility] PROGRESS NOTE    Progress Note  BROOKLYN JEFF  FMW:[id number] DOB: [birth year] DOA: 03/08/2024 PCP: Gretta Ozell CROME, PA-C     Brief Narrative:   Benjamin [name] is an 53 y.o. 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  In the ED CT scan of the abdomen pelvis showed acute diverticulitis of the sigmoid colon with possible intramural abscess.  Assessment/Plan:   Recurrent acute Diverticulitis Has been tolerating his full liquid diet, advance to soft diet. Change antibiotics to Augmentin . Started on MiraLAX  p.o. twice daily Is not controlled continue IV narcotics and orals.  Essential hypertension: Continue losartan  and diltiazem .  History of DVT/factor V Leyden: Continue Xarelto .  Hyperlipidemia:  Continue statins.  History of migraine: Continue Fioricet .  Anxiety  and depression: Continue Wellbutrin .  Benign prostatic hyperplasia Continue Flomax .  DVT prophylaxis: lovenox  Family Communication:none Status is: Inpatient Remains inpatient appropriate because: Recurrent acute diverticulitis    Code Status:     Code Status Orders  (From admission, onward)           Start     Ordered   03/09/24 0754  Full code  Continuous       Question:  By:  Answer:  Consent: discussion documented in EHR   03/09/24 0754           Code Status History     Date Active Date Inactive Code Status Order ID Comments User Context   09/21/2023 0617 09/25/2023 1713 Full Code [id number]  Marcene Eva NOVAK [facility] Inpatient   09/22/2022 2133 09/27/2022 1653 Full Code [id number]  Zella Katha HERO, MD Inpatient   10/09/2019 1809 10/12/2019 1628 Full Code [id number]  Sonjia Held, MD Inpatient         IV Access:   Peripheral  IV   Procedures and diagnostic studies:   No results found.    Medical Consultants:   None.   Subjective:    Benjamin [name] pain not controlled.  Objective:    Vitals:   03/10/24 0523 03/10/24 1403 03/10/24 2144 03/11/24 0600  BP: (!) 152/102 (!) 135/97 (!) 145/100 (!) 148/101  Pulse: 80 91 74 78  Resp: 15 18 14 18   Temp: 97.8 F (36.6 C) 98 F (36.7 C) 98.6 F (37 C) 98.1 F (36.7 C)  TempSrc: Oral  Oral Oral  SpO2: 97% 98% 98% 96%  Weight:      Height:       SpO2: 96 %   Intake/Output Summary (Last 24 hours) at 03/11/2024 0838 Last data filed at 03/11/2024 0600 Gross per 24 hour  Intake 2133.59 ml  Output 2600 ml  Net -466.41 ml   Filed Weights   03/08/24 1959  Weight: 86.2 kg    Exam: General exam: In no acute distress. Respiratory system: Good air movement and clear to auscultation. Cardiovascular system: S1 & S2 heard, RRR. No JVD. Gastrointestinal system: Abdomen is nondistended, soft and nontender.  Extremities: No pedal edema. Skin: No rashes, lesions or ulcers Psychiatry: Judgement and insight appear normal. Mood & affect appropriate.  Data Reviewed:    Labs: Basic Metabolic Panel: Recent Labs  Lab 03/08/24 2028 03/10/24 0443  NA 139 137  K 4.0 3.5  CL 102 102  CO2 20* 24  GLUCOSE 162* 104*  BUN 16  9  CREATININE 1.21 0.99  CALCIUM  9.1 9.2   GFR Estimated Creatinine Clearance: 92.8 mL/min (by C-G formula based on SCr of 0.99 mg/dL). Liver Function Tests: Recent Labs  Lab 03/08/24 2028  AST 25  ALT 15  ALKPHOS 78  BILITOT 0.3  PROT 7.2  ALBUMIN 4.5   Recent Labs  Lab 03/08/24 2028  LIPASE 12   No results for input(s): AMMONIA in the last 168 hours. Coagulation profile No results for input(s): INR, PROTIME in the last 168 hours. COVID-19 Labs  No results for input(s): DDIMER, FERRITIN, LDH, CRP in the last 72 hours.  Lab Results  Component Value Date   SARSCOV2NAA NEGATIVE 08/12/2023    SARSCOV2NAA NEGATIVE 10/09/2019    CBC: Recent Labs  Lab 03/08/24 2028 03/10/24 0443  WBC 15.2* 11.1*  HGB 15.0 14.2  HCT 42.5 43.4  MCV 88.4 90.8  PLT 365 330   Cardiac Enzymes: No results for input(s): CKTOTAL, CKMB, CKMBINDEX, TROPONINI in the last 168 hours. BNP (last 3 results) No results for input(s): PROBNP in the last 8760 hours. CBG: No results for input(s): GLUCAP in the last 168 hours. D-Dimer: No results for input(s): DDIMER in the last 72 hours. Hgb A1c: No results for input(s): HGBA1C in the last 72 hours. Lipid Profile: No results for input(s): CHOL, HDL, LDLCALC, TRIG, CHOLHDL, LDLDIRECT in the last 72 hours. Thyroid  function studies: No results for input(s): TSH, T4TOTAL, T3FREE, THYROIDAB in the last 72 hours.  Invalid input(s): FREET3 Anemia work up: No results for input(s): VITAMINB12, FOLATE, FERRITIN, TIBC, IRON, RETICCTPCT in the last 72 hours. Sepsis Labs: Recent Labs  Lab 03/08/24 2028 03/10/24 0443  WBC 15.2* 11.1*   Microbiology No results found for this or any previous visit (from the past 240 hours).   Medications:    buPROPion   450 mg Oral Daily   diltiazem   240 mg Oral Daily   influenza vac split trivalent PF  0.5 mL Intramuscular Tomorrow-1000   losartan   25 mg Oral Daily   pantoprazole   40 mg Oral BID   rivaroxaban   20 mg Oral Q supper   rosuvastatin   40 mg Oral Q7 days   tamsulosin   0.4 mg Oral Daily   valACYclovir   500 mg Oral Daily   Continuous Infusions:  piperacillin -tazobactam (ZOSYN )  IV 3.375 g (03/11/24 0440)      LOS: 2 days   Benjamin [name]  [facility]  03/11/2024, 8:38 AM

## 2024-03-12 DIAGNOSIS — K5792 Diverticulitis of intestine, part unspecified, without perforation or abscess without bleeding: Secondary | ICD-10-CM | POA: Diagnosis not present

## 2024-03-12 MED ORDER — AMOXICILLIN-POT CLAVULANATE 875-125 MG PO TABS: 1.0000 | ORAL_TABLET | Freq: Two times a day (BID) | ORAL | 0 refills | Status: AC

## 2024-03-12 MED ORDER — HYDROCODONE-ACETAMINOPHEN 5-325 MG PO TABS: 1.0000 | ORAL_TABLET | Freq: Four times a day (QID) | ORAL | 0 refills | Status: AC | PRN

## 2024-03-12 NOTE — Plan of Care (Signed)

## 2024-03-12 NOTE — Discharge Summary (Signed)
 Physician Discharge Summary  Benjamin [name] FMW:[id number] DOB: [birth year] DOA: 03/08/2024  PCP: Gretta Ozell CROME, PA-C  Admit date: 03/08/2024 Discharge date: 03/12/2024  Admitted From: Home Disposition:  Home  Recommendations for Outpatient Follow-up:  Follow up with PCP in 1-2 weeks Please obtain BMP/CBC in one week   Home Health:No Equipment/Devices:None  Discharge Condition:Stable CODE STATUS:FUll Diet recommendation: Heart Healthy Brief/Interim Summary:  53 y.o. 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  In the ED CT scan of the abdomen pelvis showed acute diverticulitis of the sigmoid colon with possible intramural abscess.   Discharge Diagnoses:  Principal Problem:   Diverticulitis Active Problems:   Benign prostatic hyperplasia   History of DVT (deep vein thrombosis)   History of factor V Leiden mutation   Migraine without status migrainosus, not intractable  Recurrent acute diverticulitis: You start empirically on IV antibiotics. MiraLAX  p.o. twice daily. His pain was controlled he defervesced leukocytosis improved he was transition to oral Augmentin  which should continue as an outpatient.  Essential hypertension: Currently GingiMed to his medication.  History of DVT/factor V Leyden deficiency: Continue Xarelto .  Hyperlipidemia: Continue statins.  History of migraine: Continue Fioricet .  Anxiety and depression: Continue Wellbutrin .  BPH: Continue Flomax .  Discharge Instructions  Discharge Instructions     Diet - low sodium heart healthy   Complete by: As directed    Increase activity slowly   Complete by: As directed       Allergies as of 03/12/2024       Reactions   Bismuth Subsalicylate Swelling, Other (See  Comments)   Compazine  [prochlorperazine  Edisylate] Other (See Comments)   Cervical dystonia and panic attack        Medication List     TAKE these medications    acetaminophen  500 MG tablet Commonly known as: TYLENOL  Take 500-1,000 mg by mouth every 6 (six) hours as needed for moderate pain (pain score 4-6).   amoxicillin -clavulanate 875-125 MG tablet Commonly known as: AUGMENTIN  Take 1 tablet by mouth every 12 (twelve) hours for 12 days.   buPROPion  150 MG 24 hr tablet Commonly known as: WELLBUTRIN  XL Take 450 mg by mouth daily.   butalbital -acetaminophen -caffeine  50-325-40 MG tablet Commonly known as: FIORICET  Take 1 tablet by mouth every 4 (four) hours as needed for headache.   cetirizine  10 MG tablet Commonly known as: ZYRTEC  Take 10 mg by mouth daily as needed for allergies.   diltiazem  240 MG 24 hr capsule Commonly known as: CARDIZEM  CD TAKE 1 CAPSULE BY MOUTH EVERY DAY   esomeprazole  20 MG capsule Commonly known as: NEXIUM  Take 40 mg by mouth daily at 12 noon.   HYDROcodone -acetaminophen  5-325 MG tablet Commonly known as: NORCO/VICODIN Take 1 tablet by mouth every 6 (six) hours as needed for up to 3 days. What changed: reasons to take this   losartan  25 MG tablet Commonly known as: COZAAR  Take 2 tablets (50 mg total) by mouth daily. What changed: how much to take   ondansetron  4 MG disintegrating tablet Commonly known as: ZOFRAN -ODT Take 1 tablet (4 mg total) by mouth every 8 (eight) hours as needed for nausea or vomiting.   rivaroxaban  20 MG Tabs tablet Commonly known as: XARELTO  Take 1 tablet (20 mg total) by mouth daily with supper.   rosuvastatin  40 MG  tablet Commonly known as: CRESTOR  Take 1 tablet (40 mg total) by mouth once a week. What changed: additional instructions   tamsulosin  0.4 MG Caps capsule Commonly known as: FLOMAX  Take 1 capsule (0.4 mg total) by mouth daily.   terbinafine  250 MG tablet Commonly known as: LAMISIL  Take 250  mg by mouth daily.   tiZANidine  4 MG tablet Commonly known as: ZANAFLEX  Take 4 mg by mouth every 8 (eight) hours as needed for muscle spasms.   valACYclovir  500 MG tablet Commonly known as: VALTREX  Take 1 tablet (500 mg total) by mouth daily.        Allergies  Allergen Reactions   Bismuth Subsalicylate Swelling and Other (See Comments)   Compazine  [Prochlorperazine  Edisylate] Other (See Comments)    Cervical dystonia and panic attack    Consultations: GI   Procedures/Studies: CT ABDOMEN PELVIS W CONTRAST Result Date: 03/08/2024 CLINICAL DATA:  Abdominal pain.  History of diverticulitis. EXAM: CT ABDOMEN AND PELVIS WITH CONTRAST TECHNIQUE: Multidetector CT imaging of the abdomen and pelvis was performed using the standard protocol following bolus administration of intravenous contrast. RADIATION DOSE REDUCTION: This exam was performed according to the departmental dose-optimization program which includes automated exposure control, adjustment of the mA and/or kV according to patient size and/or use of iterative reconstruction technique. CONTRAST:  OMNIPAQUE  IOHEXOL  300 MG/ML  SOLN COMPARISON:  CT scan 10/19/2023 FINDINGS: Lower chest: The lung bases are clear of acute process. No pleural effusion or pulmonary lesions. The heart is normal in size. No pericardial effusion. The distal esophagus and aorta are unremarkable. Hepatobiliary: No focal liver abnormality is seen. No gallstones, gallbladder wall thickening, or biliary dilatation. Pancreas: Unremarkable. No pancreatic ductal dilatation or surrounding inflammatory changes. Spleen: Normal in size without focal abnormality. Adrenals/Urinary Tract: The adrenal glands are normal. Bilateral renal calculi noted but no hydroureteronephrosis to suggest obstructing ureteral calculus. The bladder is unremarkable. No renal lesions or findings to suggest pyelonephritis. Stomach/Bowel: The stomach, duodenum and small bowel are unremarkable. No  acute inflammatory process or obstructive findings. The terminal ileum is normal. The appendix is normal. Changes of acute diverticulitis involving the mid sigmoid colon with marked submucosal edema and possible rim enhancing intramural abscess. No free air, free fluid or free pelvic abscess. Stable underlying changes of diverticulosis. Vascular/Lymphatic: The aorta and branch vessels are patent. Scattered atherosclerotic calcifications. The major venous structures are patent. Small scattered mesenteric and retroperitoneal lymph nodes but no mass or adenopathy. Reproductive: The prostate gland and seminal vesicles are unremarkable. Other: Small periumbilical abdominal wall hernia containing fat. No subcutaneous lesions. No inguinal hernia or adenopathy. Musculoskeletal: No significant bony findings. IMPRESSION: 1. Acute diverticulitis involving the mid sigmoid colon with marked submucosal edema and possible rim enhancing intramural abscess. No free air, free fluid or free pelvic abscess. 2. Bilateral renal calculi but no obstructing ureteral calculus. 3. Small periumbilical abdominal wall hernia containing fat. Electronically Signed   By: MYRTIS Stammer M.D.   On: 03/08/2024 21:44   (Echo, Carotid, EGD, Colonoscopy, ERCP)    Subjective: No complaints  Discharge Exam: Vitals:   03/11/24 2102 03/12/24 0529  BP: (!) 144/92 (!) 135/110  Pulse: 86 85  Resp: 17 17  Temp: 98.5 F (36.9 C) 97.9 F (36.6 C)  SpO2: 96% 96%   Vitals:   03/11/24 0600 03/11/24 1138 03/11/24 2102 03/12/24 0529  BP: (!) 148/101 (!) 136/101 (!) 144/92 (!) 135/110  Pulse: 78 78 86 85  Resp: 18 18 17 17   Temp: 98.1 F (  36.7 C) 98.3 F (36.8 C) 98.5 F (36.9 C) 97.9 F (36.6 C)  TempSrc: Oral Oral Oral Oral  SpO2: 96% 97% 96% 96%  Weight:      Height:        General: Pt is alert, awake, not in acute distress Cardiovascular: RRR, S1/S2 +, no rubs, no gallops Respiratory: CTA bilaterally, no wheezing, no  rhonchi Abdominal: Soft, NT, ND, bowel sounds + Extremities: no edema, no cyanosis    The results of significant diagnostics from this hospitalization (including imaging, microbiology, ancillary and laboratory) are listed below for reference.     Microbiology: No results found for this or any previous visit (from the past 240 hours).   Labs: BNP (last 3 results) No results for input(s): BNP in the last 8760 hours. Basic Metabolic Panel: Recent Labs  Lab 03/08/24 2028 03/10/24 0443  NA 139 137  K 4.0 3.5  CL 102 102  CO2 20* 24  GLUCOSE 162* 104*  BUN 16 9  CREATININE 1.21 0.99  CALCIUM  9.1 9.2   Liver Function Tests: Recent Labs  Lab 03/08/24 2028  AST 25  ALT 15  ALKPHOS 78  BILITOT 0.3  PROT 7.2  ALBUMIN 4.5   Recent Labs  Lab 03/08/24 2028  LIPASE 12   No results for input(s): AMMONIA in the last 168 hours. CBC: Recent Labs  Lab 03/08/24 2028 03/10/24 0443 03/11/24 0918  WBC 15.2* 11.1* 9.7  HGB 15.0 14.2 15.6  HCT 42.5 43.4 48.4  MCV 88.4 90.8 93.3  PLT 365 330 347   Cardiac Enzymes: No results for input(s): CKTOTAL, CKMB, CKMBINDEX, TROPONINI in the last 168 hours. BNP: Invalid input(s): POCBNP CBG: No results for input(s): GLUCAP in the last 168 hours. D-Dimer No results for input(s): DDIMER in the last 72 hours. Hgb A1c No results for input(s): HGBA1C in the last 72 hours. Lipid Profile No results for input(s): CHOL, HDL, LDLCALC, TRIG, CHOLHDL, LDLDIRECT in the last 72 hours. Thyroid  function studies No results for input(s): TSH, T4TOTAL, T3FREE, THYROIDAB in the last 72 hours.  Invalid input(s): FREET3 Anemia work up No results for input(s): VITAMINB12, FOLATE, FERRITIN, TIBC, IRON, RETICCTPCT in the last 72 hours. Urinalysis    Component Value Date/Time   COLORURINE COLORLESS (A) 03/08/2024 2210   APPEARANCEUR CLEAR 03/08/2024 2210   LABSPEC >1.046 (H) 03/08/2024 2210    PHURINE 6.5 03/08/2024 2210   GLUCOSEU NEGATIVE 03/08/2024 2210   GLUCOSEU NEGATIVE 09/02/2015 1443   HGBUR TRACE (A) 03/08/2024 2210   BILIRUBINUR NEGATIVE 03/08/2024 2210   BILIRUBINUR negative 04/05/2018 1102   BILIRUBINUR negative 04/26/2017 1448   KETONESUR NEGATIVE 03/08/2024 2210   PROTEINUR NEGATIVE 03/08/2024 2210   UROBILINOGEN 0.2 04/05/2018 1102   UROBILINOGEN 0.2 09/02/2015 1443   NITRITE NEGATIVE 03/08/2024 2210   LEUKOCYTESUR NEGATIVE 03/08/2024 2210   Sepsis Labs Recent Labs  Lab 03/08/24 2028 03/10/24 0443 03/11/24 0918  WBC 15.2* 11.1* 9.7   Microbiology No results found for this or any previous visit (from the past 240 hours).   Time coordinating discharge: Over 35 minutes  SIGNED:   Erle Odell Castor, MD  [facility] 03/12/2024, 7:24 AM Pager   If 7PM-7AM, please contact night-coverage [email or web address] Password [email or web address]

## 2024-06-01 ENCOUNTER — Inpatient Hospital Stay (HOSPITAL_BASED_OUTPATIENT_CLINIC_OR_DEPARTMENT_OTHER)
Admission: EM | Admit: 2024-06-01 | Discharge: 2024-06-09 | DRG: 329 | Disposition: A | Discharge status: Home / Self Care | Attending: Internal Medicine | Admitting: Internal Medicine

## 2024-06-01 ENCOUNTER — Encounter (HOSPITAL_BASED_OUTPATIENT_CLINIC_OR_DEPARTMENT_OTHER): Payer: Self-pay

## 2024-06-01 ENCOUNTER — Other Ambulatory Visit: Payer: Self-pay

## 2024-06-01 ENCOUNTER — Emergency Department (HOSPITAL_BASED_OUTPATIENT_CLINIC_OR_DEPARTMENT_OTHER)

## 2024-06-01 DIAGNOSIS — Z801 Family history of malignant neoplasm of trachea, bronchus and lung: Secondary | ICD-10-CM

## 2024-06-01 DIAGNOSIS — K5720 Diverticulitis of large intestine with perforation and abscess without bleeding: Secondary | ICD-10-CM | POA: Diagnosis present

## 2024-06-01 DIAGNOSIS — R338 Other retention of urine: Secondary | ICD-10-CM | POA: Diagnosis present

## 2024-06-01 DIAGNOSIS — Z79899 Other long term (current) drug therapy: Secondary | ICD-10-CM | POA: Diagnosis not present

## 2024-06-01 DIAGNOSIS — I10 Essential (primary) hypertension: Secondary | ICD-10-CM | POA: Diagnosis not present

## 2024-06-01 DIAGNOSIS — N182 Chronic kidney disease, stage 2 (mild): Secondary | ICD-10-CM | POA: Diagnosis present

## 2024-06-01 DIAGNOSIS — Z888 Allergy status to other drugs, medicaments and biological substances status: Secondary | ICD-10-CM

## 2024-06-01 DIAGNOSIS — Z800 Family history of malignant neoplasm of digestive organs: Secondary | ICD-10-CM

## 2024-06-01 DIAGNOSIS — N400 Enlarged prostate without lower urinary tract symptoms: Secondary | ICD-10-CM

## 2024-06-01 DIAGNOSIS — Z833 Family history of diabetes mellitus: Secondary | ICD-10-CM

## 2024-06-01 DIAGNOSIS — Z7901 Long term (current) use of anticoagulants: Secondary | ICD-10-CM | POA: Diagnosis not present

## 2024-06-01 DIAGNOSIS — N401 Enlarged prostate with lower urinary tract symptoms: Secondary | ICD-10-CM | POA: Diagnosis present

## 2024-06-01 DIAGNOSIS — E876 Hypokalemia: Secondary | ICD-10-CM | POA: Diagnosis present

## 2024-06-01 DIAGNOSIS — Z86718 Personal history of other venous thrombosis and embolism: Secondary | ICD-10-CM | POA: Diagnosis not present

## 2024-06-01 DIAGNOSIS — K219 Gastro-esophageal reflux disease without esophagitis: Secondary | ICD-10-CM | POA: Diagnosis present

## 2024-06-01 DIAGNOSIS — D6851 Activated protein C resistance: Secondary | ICD-10-CM | POA: Diagnosis present

## 2024-06-01 DIAGNOSIS — F32A Depression, unspecified: Secondary | ICD-10-CM | POA: Diagnosis present

## 2024-06-01 DIAGNOSIS — E785 Hyperlipidemia, unspecified: Secondary | ICD-10-CM | POA: Diagnosis present

## 2024-06-01 DIAGNOSIS — Z83438 Family history of other disorder of lipoprotein metabolism and other lipidemia: Secondary | ICD-10-CM | POA: Diagnosis not present

## 2024-06-01 DIAGNOSIS — K5792 Diverticulitis of intestine, part unspecified, without perforation or abscess without bleeding: Principal | ICD-10-CM

## 2024-06-01 DIAGNOSIS — K651 Peritoneal abscess: Secondary | ICD-10-CM | POA: Diagnosis present

## 2024-06-01 DIAGNOSIS — Z862 Personal history of diseases of the blood and blood-forming organs and certain disorders involving the immune mechanism: Secondary | ICD-10-CM | POA: Diagnosis not present

## 2024-06-01 DIAGNOSIS — Z8249 Family history of ischemic heart disease and other diseases of the circulatory system: Secondary | ICD-10-CM

## 2024-06-01 DIAGNOSIS — I129 Hypertensive chronic kidney disease with stage 1 through stage 4 chronic kidney disease, or unspecified chronic kidney disease: Secondary | ICD-10-CM | POA: Diagnosis present

## 2024-06-01 LAB — URINALYSIS, ROUTINE W REFLEX MICROSCOPIC
Bacteria, UA: NONE SEEN
Bilirubin Urine: NEGATIVE
Glucose, UA: NEGATIVE mg/dL
Ketones, ur: NEGATIVE mg/dL
Leukocytes,Ua: NEGATIVE
Nitrite: NEGATIVE
Protein, ur: NEGATIVE mg/dL
Specific Gravity, Urine: 1.01 (ref 1.005–1.030)
pH: 6.5 (ref 5.0–8.0)

## 2024-06-01 LAB — CBC
HCT: 44.5 % (ref 39.0–52.0)
Hemoglobin: 15.7 g/dL (ref 13.0–17.0)
MCH: 30.7 pg (ref 26.0–34.0)
MCHC: 35.3 g/dL (ref 30.0–36.0)
MCV: 86.9 fL (ref 80.0–100.0)
Platelets: 354 K/uL (ref 150–400)
RBC: 5.12 MIL/uL (ref 4.22–5.81)
RDW: 12.3 % (ref 11.5–15.5)
WBC: 11.8 K/uL — ABNORMAL HIGH (ref 4.0–10.5)
nRBC: 0 % (ref 0.0–0.2)

## 2024-06-01 LAB — COMPREHENSIVE METABOLIC PANEL WITH GFR
ALT: 20 U/L (ref 0–44)
AST: 26 U/L (ref 15–41)
Albumin: 4.6 g/dL (ref 3.5–5.0)
Alkaline Phosphatase: 82 U/L (ref 38–126)
Anion gap: 14 (ref 5–15)
BUN: 17 mg/dL (ref 6–20)
CO2: 22 mmol/L (ref 22–32)
Calcium: 9.9 mg/dL (ref 8.9–10.3)
Chloride: 104 mmol/L (ref 98–111)
Creatinine, Ser: 1.15 mg/dL (ref 0.61–1.24)
GFR, Estimated: >60 mL/min
Glucose, Bld: 102 mg/dL — ABNORMAL HIGH (ref 70–99)
Potassium: 4.4 mmol/L (ref 3.5–5.1)
Sodium: 139 mmol/L (ref 135–145)
Total Bilirubin: 0.5 mg/dL (ref 0.0–1.2)
Total Protein: 8 g/dL (ref 6.5–8.1)

## 2024-06-01 LAB — LIPASE, BLOOD: Lipase: <10 U/L — ABNORMAL LOW (ref 11–51)

## 2024-06-01 LAB — HEPARIN LEVEL (UNFRACTIONATED): Heparin Unfractionated: 0.62 [IU]/mL (ref 0.30–0.70)

## 2024-06-01 LAB — APTT: aPTT: 56 s — ABNORMAL HIGH (ref 24–36)

## 2024-06-01 MED ORDER — CHLORHEXIDINE GLUCONATE CLOTH 2 % EX PADS
6.0000 | MEDICATED_PAD | Freq: Every day | CUTANEOUS | Status: DC
  Administered 2024-06-02 – 2024-06-09 (×8): 6 via TOPICAL
  Filled 2024-06-01: qty 6

## 2024-06-01 MED ORDER — ACETAMINOPHEN 500 MG PO TABS
1000.0000 mg | ORAL_TABLET | Freq: Four times a day (QID) | ORAL | Status: DC | PRN
  Administered 2024-06-02: 1000 mg via ORAL
  Filled 2024-06-01: qty 2

## 2024-06-01 MED ORDER — IOHEXOL 300 MG/ML  SOLN
100.0000 mL | Freq: Once | INTRAMUSCULAR | Status: AC | PRN
  Administered 2024-06-01: 100 mL via INTRAVENOUS

## 2024-06-01 MED ORDER — HYDROMORPHONE HCL 1 MG/ML IJ SOLN
1.0000 mg | Freq: Once | INTRAMUSCULAR | Status: AC
  Administered 2024-06-01: 1 mg via INTRAVENOUS
  Filled 2024-06-01: qty 1

## 2024-06-01 MED ORDER — ONDANSETRON HCL 4 MG/2ML IJ SOLN
4.0000 mg | Freq: Once | INTRAMUSCULAR | Status: AC
  Administered 2024-06-01: 4 mg via INTRAVENOUS
  Filled 2024-06-01: qty 2

## 2024-06-01 MED ORDER — OXYBUTYNIN CHLORIDE 5 MG PO TABS: 2.5000 mg | ORAL_TABLET | Freq: Three times a day (TID) | ORAL | Status: DC

## 2024-06-01 MED ORDER — LORAZEPAM 2 MG/ML IJ SOLN
1.0000 mg | Freq: Once | INTRAMUSCULAR | Status: AC
  Administered 2024-06-01: 1 mg via INTRAVENOUS
  Filled 2024-06-01: qty 1

## 2024-06-01 MED ORDER — TAMSULOSIN HCL 0.4 MG PO CAPS
0.4000 mg | ORAL_CAPSULE | Freq: Every day | ORAL | Status: DC
  Administered 2024-06-02 – 2024-06-09 (×8): 0.4 mg via ORAL
  Filled 2024-06-01 (×8): qty 1

## 2024-06-01 MED ORDER — ONDANSETRON HCL 4 MG/2ML IJ SOLN
4.0000 mg | Freq: Four times a day (QID) | INTRAMUSCULAR | Status: DC | PRN
  Administered 2024-06-02 – 2024-06-06 (×3): 4 mg via INTRAVENOUS
  Filled 2024-06-01 (×3): qty 2

## 2024-06-01 MED ORDER — LOSARTAN POTASSIUM 25 MG PO TABS
25.0000 mg | ORAL_TABLET | Freq: Every day | ORAL | Status: DC
  Administered 2024-06-01: 25 mg via ORAL
  Filled 2024-06-01: qty 1

## 2024-06-01 MED ORDER — BUTALBITAL-APAP-CAFFEINE 50-325-40 MG PO TABS
1.0000 | ORAL_TABLET | ORAL | Status: DC | PRN
  Administered 2024-06-05 – 2024-06-06 (×2): 1 via ORAL
  Filled 2024-06-01 (×2): qty 1

## 2024-06-01 MED ORDER — ONDANSETRON HCL 4 MG PO TABS
4.0000 mg | ORAL_TABLET | Freq: Four times a day (QID) | ORAL | Status: DC | PRN
  Administered 2024-06-07: 4 mg via ORAL
  Filled 2024-06-01 (×2): qty 1

## 2024-06-01 MED ORDER — METRONIDAZOLE 500 MG PO TABS
500.0000 mg | ORAL_TABLET | Freq: Two times a day (BID) | ORAL | Status: DC
  Administered 2024-06-01 – 2024-06-02 (×2): 500 mg via ORAL
  Filled 2024-06-01 (×2): qty 1

## 2024-06-01 MED ORDER — SODIUM CHLORIDE 0.9 % IV SOLN
2.0000 g | INTRAVENOUS | Status: DC
  Administered 2024-06-01: 2 g via INTRAVENOUS
  Filled 2024-06-01: qty 20

## 2024-06-01 MED ORDER — RIVAROXABAN 20 MG PO TABS: 20.0000 mg | ORAL_TABLET | Freq: Every day | ORAL | Status: DC

## 2024-06-01 MED ORDER — LACTATED RINGERS IV SOLN: INTRAVENOUS | Status: AC

## 2024-06-01 MED ORDER — HYDROMORPHONE HCL 1 MG/ML IJ SOLN: 1.0000 mg | INTRAMUSCULAR | Status: DC | PRN

## 2024-06-01 MED ORDER — LIDOCAINE HCL URETHRAL/MUCOSAL 2 % EX GEL
1.0000 | Freq: Once | CUTANEOUS | Status: AC
  Administered 2024-06-01: 1 via URETHRAL
  Filled 2024-06-01: qty 11

## 2024-06-01 MED ORDER — HYDROMORPHONE HCL 1 MG/ML IJ SOLN
1.0000 mg | INTRAMUSCULAR | Status: DC | PRN
  Administered 2024-06-01 – 2024-06-02 (×2): 1 mg via INTRAVENOUS
  Filled 2024-06-01 (×2): qty 1

## 2024-06-01 MED ORDER — LABETALOL HCL 5 MG/ML IV SOLN
10.0000 mg | Freq: Four times a day (QID) | INTRAVENOUS | Status: DC | PRN
  Administered 2024-06-02 – 2024-06-07 (×4): 10 mg via INTRAVENOUS
  Filled 2024-06-01 (×4): qty 4

## 2024-06-01 MED ORDER — DOCUSATE SODIUM 100 MG PO CAPS
100.0000 mg | ORAL_CAPSULE | Freq: Two times a day (BID) | ORAL | Status: DC
  Administered 2024-06-01 – 2024-06-06 (×10): 100 mg via ORAL
  Filled 2024-06-01 (×10): qty 1

## 2024-06-01 MED ORDER — BUPROPION HCL ER (XL) 150 MG PO TB24
450.0000 mg | ORAL_TABLET | Freq: Every day | ORAL | Status: DC
  Administered 2024-06-02 – 2024-06-09 (×8): 450 mg via ORAL
  Filled 2024-06-01 (×2): qty 1
  Filled 2024-06-01: qty 3
  Filled 2024-06-01: qty 1
  Filled 2024-06-01: qty 3
  Filled 2024-06-01: qty 1
  Filled 2024-06-01: qty 3
  Filled 2024-06-01: qty 1

## 2024-06-01 MED ORDER — ACETAMINOPHEN 650 MG RE SUPP: 650.0000 mg | Freq: Four times a day (QID) | RECTAL | Status: DC | PRN

## 2024-06-01 MED ORDER — PANTOPRAZOLE SODIUM 40 MG PO TBEC
40.0000 mg | DELAYED_RELEASE_TABLET | Freq: Two times a day (BID) | ORAL | Status: DC
  Administered 2024-06-02 – 2024-06-09 (×15): 40 mg via ORAL
  Filled 2024-06-01 (×16): qty 1

## 2024-06-01 MED ORDER — HYDROMORPHONE HCL 2 MG PO TABS
2.0000 mg | ORAL_TABLET | ORAL | Status: DC | PRN
  Administered 2024-06-01 – 2024-06-02 (×2): 2 mg via ORAL
  Filled 2024-06-01 (×2): qty 1

## 2024-06-01 MED ORDER — HEPARIN BOLUS VIA INFUSION
2000.0000 [IU] | Freq: Once | INTRAVENOUS | Status: AC
  Administered 2024-06-01: 2000 [IU] via INTRAVENOUS
  Filled 2024-06-01: qty 2000

## 2024-06-01 MED ORDER — PANTOPRAZOLE SODIUM 40 MG PO TBEC
80.0000 mg | DELAYED_RELEASE_TABLET | Freq: Every day | ORAL | Status: DC
  Administered 2024-06-01: 80 mg via ORAL
  Filled 2024-06-01: qty 2

## 2024-06-01 MED ORDER — PIPERACILLIN-TAZOBACTAM 3.375 G IVPB 30 MIN
3.3750 g | Freq: Once | INTRAVENOUS | Status: AC
  Administered 2024-06-01: 3.375 g via INTRAVENOUS
  Filled 2024-06-01: qty 50

## 2024-06-01 MED ORDER — OXYBUTYNIN CHLORIDE 5 MG PO TABS
5.0000 mg | ORAL_TABLET | Freq: Three times a day (TID) | ORAL | Status: DC | PRN
  Administered 2024-06-01 – 2024-06-02 (×2): 5 mg via ORAL
  Filled 2024-06-01 (×2): qty 1

## 2024-06-01 MED ORDER — PIPERACILLIN-TAZOBACTAM 3.375 G IVPB: 3.3750 g | Freq: Three times a day (TID) | INTRAVENOUS | Status: DC

## 2024-06-01 MED ORDER — HEPARIN (PORCINE) 25000 UT/250ML-% IV SOLN
1600.0000 [IU]/h | INTRAVENOUS | Status: AC
  Administered 2024-06-01: 1500 [IU]/h via INTRAVENOUS
  Administered 2024-06-02: 1800 [IU]/h via INTRAVENOUS
  Administered 2024-06-02: 1550 [IU]/h via INTRAVENOUS
  Administered 2024-06-03: 1350 [IU]/h via INTRAVENOUS
  Administered 2024-06-04 – 2024-06-05 (×3): 1600 [IU]/h via INTRAVENOUS
  Filled 2024-06-01 (×7): qty 250

## 2024-06-01 MED ORDER — DILTIAZEM HCL ER COATED BEADS 240 MG PO CP24
240.0000 mg | ORAL_CAPSULE | Freq: Every day | ORAL | Status: DC
  Administered 2024-06-01 – 2024-06-09 (×9): 240 mg via ORAL
  Filled 2024-06-01 (×7): qty 1
  Filled 2024-06-01: qty 2
  Filled 2024-06-01: qty 1

## 2024-06-01 MED ORDER — LACTATED RINGERS IV BOLUS
1000.0000 mL | Freq: Once | INTRAVENOUS | Status: AC
  Administered 2024-06-01: 1000 mL via INTRAVENOUS

## 2024-06-01 NOTE — Hospital Course (Addendum)
 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  Has had multiple bouts of recurrent diverticulitis. Has been seen by [facility] surgery(Dr. Ann) in June 2025.   He states he took some augmentin  he had at home for a few days about 1 weeks ago that resolved his pain.    He states that pain started again last night.  Progressively got worse overnight.  Came to the ER for evaluation.  He knew what the pain felt like as he has had multiple bouts of diverticulitis in the past.  He does have a history of factor V Leyden with history of multiple DVTs.  He is on Cardizem  and olmesartan for hypertension.  ER nurse notes the patient has been having increasing urinary urgency.  He had a bladder scan that had about 300 cc of urine but this was about 45 minutes after he had urinated.  He keeps on having the sensation he has to urinate.  On arrival to ER, temp 98.7, HR 99, BP 191/128. C/o of urinary urgency in addition to LLQ abd pain  WBC 11.8, HgB 15.7, Plt 354 Na 139, K 4.4, CO2 of 22, BUN 17, Scr 1.15, glu 102,   T. Prot 8.0, alb 4.6. AST 26, ALT 20, alk phos 82. T. Bili 0.5  CT abdomen pelvis demonstrated acute diverticulitis involving the mid sigmoid colon with suspected peripherally Hanssen intramural abscess measuring 3.0 x 1.9 cm.  [facility]  hospitalist consulted for admission.  EDP was instructed to reach out to general surgery for consultation when the patient arrives to the hospital.  Significant Events: Admitted 06/01/2024 for recurrent diverticulitis with abscess 06-01-2024 pt with acute urinary retention. Multiple attempts by RN to place indwelling urinary catheter were unsuccessful. Required ED MD to place coude catheter with topical lidocaine and IV Ativan .  Admission Labs: WBC 11.8, HgB 15.7, Plt 354 Na 139, K 4.4, CO2 of 22, BUN 17,  Scr 1.15, glu 102,  T. Prot 8.0, alb 4.6. AST 26, ALT 20, alk phos 82. T. Bili 0.5  Admission Imaging Studies: CT abd/pelvis 1. Acute diverticulitis involving the mid sigmoid colon with suspected peripherally enhancing intramural abscess measuring approximately 3.0 x 1.9 cm. Similar findings were noted on the prior exam. No evidence of perforation. 2. Unchanged nonobstructive bilateral renal calculi. No hydronephrosis  Significant Labs:   Significant Imaging Studies:   Antibiotic Therapy: Anti-infectives (From admission, onward)    Start     Dose/Rate Route Frequency Ordered Stop   06/01/24 1800  piperacillin -tazobactam (ZOSYN ) IVPB 3.375 g        3.375 g 12.5 mL/hr over 240 Minutes Intravenous Every 8 hours 06/01/24 1153     06/01/24 1200  piperacillin -tazobactam (ZOSYN ) IVPB 3.375 g        3.375 g 100 mL/hr over 30 Minutes Intravenous  Once 06/01/24 1151         Procedures:   Consultants: General surgery - [place]

## 2024-06-01 NOTE — ED Notes (Signed)
 [facility] at bedside

## 2024-06-01 NOTE — Assessment & Plan Note (Addendum)
 06/01/2024 last dose of Xarelto  was AM of 05-31-2024. Will start IV heparin gtts to prevent DVTs.

## 2024-06-01 NOTE — Assessment & Plan Note (Addendum)
 06/01/2024 continue with cardizem . He will need med rec as he states he is no longer on losartan . He takes Olmesartan but does not know the dose. Add prn IV labetalol 10 mg q6h prn SBP >170 or DBP >100

## 2024-06-01 NOTE — H&P (Addendum)
 " Virtual History and Physical    Benjamin [name] FMW:[id number] DOB: May 15, 1971 DOA: 06/01/2024  DOS: the patient was seen and examined on 06/01/2024  PCP: Gretta Ozell CROME, PA-C   Referring Provider: benton shone, MD Telemedicine Provider: Camellia Door, DO Provider Location: [facility] hospital Patient Location: [facility] ER Referring Diagnosis: diverticulitis with abscess Patient Name and DOB verified: yes Patient consented to Telemedicine Evaluation:yes RN virtual assistant: Bernardino Larve, RN Video encounter time and date:06/01/2024. 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  Has had multiple bouts of recurrent diverticulitis. Has been seen by [facility] surgery(Dr. Ann) in June 2025.   He states he took some augmentin  he had at home for a few days about 1 weeks ago that resolved his pain.    He states that pain started again last night.  Progressively got worse overnight.  Came to the ER for evaluation.  He knew what the pain felt like as he has had multiple bouts of diverticulitis in the past.  He does have a history of factor V Leyden with history of multiple DVTs.  He is on Cardizem  and olmesartan for hypertension.  ER nurse notes the patient has been having increasing urinary urgency.  He had a bladder scan that had about 300 cc of urine but this was about 45 minutes after he had urinated.  He keeps on having the sensation he has to urinate.  On arrival to ER, temp 98.7, HR 99, BP 191/128. C/o of urinary urgency in addition to LLQ abd pain  WBC 11.8, HgB 15.7, Plt 354 Na 139, K 4.4, CO2 of 22, BUN 17, Scr 1.15, glu 102,   T. Prot 8.0, alb 4.6. AST 26, ALT 20, alk phos 82. T. Bili 0.5  CT abdomen pelvis  demonstrated acute diverticulitis involving the mid sigmoid colon with suspected peripherally Hanssen intramural abscess measuring 3.0 x 1.9 cm.  [facility]  hospitalist consulted for admission.  EDP was instructed to reach out to general surgery for consultation when the patient arrives to the hospital.  Significant Events: Admitted 06/01/2024 for recurrent diverticulitis with abscess 06-01-2024 pt with acute urinary retention. Multiple attempts by RN to place indwelling urinary catheter were unsuccessful. Required ED MD to place coude catheter with topical lidocaine and IV Ativan .  Admission Labs: WBC 11.8, HgB 15.7, Plt 354 Na 139, K 4.4, CO2 of 22, BUN 17, Scr 1.15, glu 102,  T. Prot 8.0, alb 4.6. AST 26, ALT 20, alk phos 82. T. Bili 0.5  Admission Imaging Studies: CT abd/pelvis 1. Acute diverticulitis involving the mid sigmoid colon with suspected peripherally enhancing intramural abscess measuring approximately 3.0 x 1.9 cm. Similar findings were noted on the prior exam. No evidence of perforation. 2. Unchanged nonobstructive bilateral renal calculi. No hydronephrosis  Significant Labs:   Significant Imaging Studies:   Antibiotic Therapy: Anti-infectives (From admission, onward)    Start     Dose/Rate Route Frequency Ordered Stop   06/01/24 1800  piperacillin -tazobactam (ZOSYN ) IVPB 3.375 g        3.375 g 12.5 mL/hr over 240 Minutes Intravenous Every 8 hours 06/01/24 1153     06/01/24 1200  piperacillin -tazobactam (ZOSYN ) IVPB 3.375 g  3.375 g 100 mL/hr over 30 Minutes Intravenous  Once 06/01/24 1151         Procedures:   Consultants: General surgery - [place]   ED Course: CT showed sigmoid diverticulitis with abscess. Pt started on IV ABX. Pt with urinary retention. Pt requesting foley catheter. RN attempted foley with regular foley. This was unsuccessful. Gave order for RN to place coude catheter.  Review of Systems:  Review of Systems  Constitutional: Negative.    HENT: Negative.    Eyes: Negative.   Respiratory: Negative.    Cardiovascular: Negative.   Gastrointestinal:  Positive for abdominal pain and nausea.       LLQ pain  Genitourinary:  Positive for frequency and urgency. Negative for dysuria, flank pain and hematuria.  Musculoskeletal: Negative.   Skin: Negative.   Neurological: Negative.   Endo/Heme/Allergies: Negative.   Psychiatric/Behavioral: Negative.    All other systems reviewed and are negative.   Past Medical History:  Diagnosis Date   Acute DVT of left tibial vein (HCC) 04/24/2017   Xarelto  started (dx'd in emergency dept).  Dr. Timmy recommended full dose xarelto  x 80mo, with an additional year of maintenance dose xarelto  (as of 05/2017 initial consult note).  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  Pt Factor V leiden heterozygote.  Stable as of 09/2017 hem f/u.   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  Pt referred for sleep study.   Chronic renal insufficiency, stage II (mild)    GFR 60s-70s (suspect HTN and NSAID damage as etiology).  GFR dipped into 50s fall 2018 after lots of NSAID use for chronic daily HA's.   Diverticulitis 2016   CT 01/2015   Diverticulitis of colon 07/02/2015   Frequent headaches    Gastric polyp 2006   GERD without esophagitis    Herpes labialis    Hiatal hernia    Hyperlipidemia     HYPERTENSION 01/05/2007   OSA (obstructive sleep apnea) 04/2017   Dr. Buck eval 04/2017-plan for sleep study.   PSORIASIS 01/05/2007    Past Surgical History:  Procedure Laterality Date   COLONOSCOPY W/ POLYPECTOMY  10/04/15   7mm tubular adenoma + diverticulosis: recall 5 yrs (Dr. Aneita)   ESOPHAGOGASTRODUODENOSCOPY  04/12/14   Barrett's esophagus   LIPOMA EXCISION     several, arm     reports that he has never smoked. He has never used smokeless tobacco. He reports current alcohol  use of about 2.0 standard drinks of alcohol  per week. He reports that he does not use drugs.  Allergies[1]  Family History  Problem Relation Age of Onset   Hypertension Mother    CAD Father        around age 67   Heart disease Father    Hyperlipidemia Father    Hypertension Father    Hypertension Brother    Hyperlipidemia Brother    Lung cancer Maternal  Grandmother    Colon cancer Maternal Grandfather    Cancer Maternal Grandfather        type unknown   CAD Paternal Grandmother    Diabetes Paternal Grandmother    Esophageal cancer Neg Hx    Stomach cancer Neg Hx     Prior to Admission medications  Medication Sig Start Date End Date Taking? Authorizing Provider  acetaminophen  (TYLENOL ) 500 MG tablet Take 500-1,000 mg by mouth every 6 (six) hours as needed for moderate pain (pain score 4-6).    [provider]  buPROPion  (WELLBUTRIN  XL) 150 MG 24 hr tablet Take 450 mg by mouth daily.    [provider]  butalbital -acetaminophen -caffeine  (FIORICET ) 50-325-40 MG tablet Take 1 tablet by mouth every 4 (four) hours as needed for headache. 09/25/23   Samtani, Jai-Gurmukh, MD  cetirizine  (ZYRTEC ) 10 MG tablet Take 10 mg by mouth daily as needed for allergies.    [provider]  diltiazem  (CARDIZEM  CD) 240 MG 24 hr capsule TAKE 1 CAPSULE BY MOUTH EVERY DAY 02/19/21   Patwardhan, Manish J, MD  esomeprazole  (NEXIUM ) 20 MG capsule Take 40 mg by mouth daily at 12 noon.     [provider]  losartan  (COZAAR ) 25 MG tablet Take 2 tablets (50 mg total) by mouth daily. Patient taking differently: Take 25 mg by mouth daily. 09/27/22 03/09/24  Ezenduka, Nkeiruka J, MD  ondansetron  (ZOFRAN -ODT) 4 MG disintegrating tablet Take 1 tablet (4 mg total) by mouth every 8 (eight) hours as needed for nausea or vomiting. 08/12/23   Schutt, Marsa HERO, PA-C  rivaroxaban  (XARELTO ) 20 MG TABS tablet Take 1 tablet (20 mg total) by mouth daily with supper. 10/04/22   Yolande Lamar BROCKS, MD  rosuvastatin  (CRESTOR ) 40 MG tablet Take 1 tablet (40 mg total) by mouth once a week. Patient taking differently: Take 40 mg by mouth once a week. Friday 12/27/17   Gretta Ozell CROME, PA-C  tamsulosin  (FLOMAX ) 0.4 MG CAPS capsule Take 1 capsule (0.4 mg total) by mouth daily. 04/05/18   Starla Grate D, PA-C  terbinafine  (LAMISIL ) 250 MG tablet Take 250 mg by mouth daily.    [provider]  tiZANidine  (ZANAFLEX ) 4 MG tablet Take 4 mg by mouth every 8 (eight) hours as needed for muscle spasms. Patient not taking: Reported on 03/09/2024    [provider]  valACYclovir  (VALTREX ) 500 MG tablet Take 1 tablet (500 mg total) by mouth daily. 12/30/17   Gretta Ozell CROME, PA-C  lisinopril -hydrochlorothiazide  (PRINZIDE ,ZESTORETIC ) 20-25 MG per tablet Take 1 tablet by mouth daily. 07/24/11 08/21/11  Jame Maude FALCON, MD    Physical Exam: Vitals:   06/01/24 0929 06/01/24 1405 06/01/24 1623 06/01/24 1713  BP: (!) 166/131 (!) 147/108 (!) 164/121 (!) 167/100  Pulse: 100 (!) 126 (!) 112 (!) 113  Resp: 20 20 18 18   Temp:   97.8 F (36.6 C) 98.1 F (36.7 C)  TempSrc:   Oral Oral  SpO2: 99% 96% 98% 96%  Weight:      Height:       Physical examination performed by Bernardino Larve, RN. But document in this note for clarity purposes. Physical Exam Vitals and nursing note reviewed.  Constitutional:      General: He is not in acute distress.    Appearance: Normal appearance. He is obese. He is  not toxic-appearing or diaphoretic.  HENT:     Head: Normocephalic and atraumatic.  Eyes:     General: No scleral icterus. Cardiovascular:  Rate and Rhythm: Normal rate and regular rhythm.  Pulmonary:     Effort: Pulmonary effort is normal. No respiratory distress.     Breath sounds: Normal breath sounds. No wheezing or rales.  Abdominal:     General: Bowel sounds are normal.     Palpations: Abdomen is soft.     Tenderness: There is abdominal tenderness.      Comments: Mild distension.   Musculoskeletal:     Right lower leg: No edema.     Left lower leg: No edema.  Skin:    Findings: No bruising.  Neurological:     Mental Status: He is alert and oriented to person, place, and time.      Labs on Admission: I have personally reviewed following labs and imaging studies  CBC: Recent Labs  Lab 06/01/24 0937  WBC 11.8*  HGB 15.7  HCT 44.5  MCV 86.9  PLT 354   Basic Metabolic Panel: Recent Labs  Lab 06/01/24 0937  NA 139  K 4.4  CL 104  CO2 22  GLUCOSE 102*  BUN 17  CREATININE 1.15  CALCIUM  9.9   GFR: Estimated Creatinine Clearance: 80.4 mL/min (by C-G formula based on SCr of 1.15 mg/dL). Liver Function Tests: Recent Labs  Lab 06/01/24 0937  AST 26  ALT 20  ALKPHOS 82  BILITOT 0.5  PROT 8.0  ALBUMIN 4.6   Recent Labs  Lab 06/01/24 0937  LIPASE <10*   Urine analysis:    Component Value Date/Time   COLORURINE YELLOW 06/01/2024 1030   APPEARANCEUR CLEAR 06/01/2024 1030   LABSPEC 1.010 06/01/2024 1030   PHURINE 6.5 06/01/2024 1030   GLUCOSEU NEGATIVE 06/01/2024 1030   GLUCOSEU NEGATIVE 09/02/2015 1443   HGBUR TRACE (A) 06/01/2024 1030   BILIRUBINUR NEGATIVE 06/01/2024 1030   BILIRUBINUR negative 04/05/2018 1102   BILIRUBINUR negative 04/26/2017 1448   KETONESUR NEGATIVE 06/01/2024 1030   PROTEINUR NEGATIVE 06/01/2024 1030   UROBILINOGEN 0.2 04/05/2018 1102   UROBILINOGEN 0.2 09/02/2015 1443   NITRITE NEGATIVE 06/01/2024 1030    LEUKOCYTESUR NEGATIVE 06/01/2024 1030    Radiological Exams on Admission: I have personally reviewed images CT ABDOMEN PELVIS W CONTRAST Result Date: 06/01/2024 CLINICAL DATA:  Left lower quadrant abdominal pain. History of diverticulitis. EXAM: CT ABDOMEN AND PELVIS WITH CONTRAST TECHNIQUE: Multidetector CT imaging of the abdomen and pelvis was performed using the standard protocol following bolus administration of intravenous contrast. RADIATION DOSE REDUCTION: This exam was performed according to the departmental dose-optimization program which includes automated exposure control, adjustment of the mA and/or kV according to patient size and/or use of iterative reconstruction technique. CONTRAST:  OMNIPAQUE  IOHEXOL  300 MG/ML  SOLN COMPARISON:  03/08/2024. FINDINGS: Lower chest: No acute abnormality. Hepatobiliary: No focal liver abnormality is seen. No gallstones, gallbladder wall thickening, or biliary dilatation. Pancreas: Unremarkable. No pancreatic ductal dilatation or surrounding inflammatory changes. Spleen: Unremarkable. Adrenals/Urinary Tract: Adrenal glands are unremarkable. Unchanged bilateral nonobstructive renal calculi. No ureteral calculi. No hydronephrosis. Bladder is unremarkable. Stomach/Bowel: Acute diverticulitis involving the mid sigmoid colon with submucosal edema and findings suspicious for a peripherally enhancing intramural abscess measuring approximately 3.0 x 1.9 cm. Similar findings were noted on the prior exam. No appreciable evidence of perforation. Diverticulosis of the sigmoid and descending colon. Stomach is within normal limits. No obstruction. Appendix appears normal. Vascular/Lymphatic: Abdominal aorta is normal in caliber with mild aortoiliac atherosclerotic calcification. Similar appearance of small scattered mesenteric and retroperitoneal lymph nodes, not definitively enlarged by size criteria. Reproductive: Prostate is  unremarkable. Other: No abdominopelvic ascites.   No intraperitoneal free air. Musculoskeletal: No acute or significant osseous findings. IMPRESSION: 1. Acute diverticulitis involving the mid sigmoid colon with suspected peripherally enhancing intramural abscess measuring approximately 3.0 x 1.9 cm. Similar findings were noted on the prior exam. No evidence of perforation. 2. Unchanged nonobstructive bilateral renal calculi. No hydronephrosis. Electronically Signed   By: Harrietta Sherry M.D.   On: 06/01/2024 11:42    EKG: My personal interpretation of EKG shows: no EKG to review  Assessment/Plan Principal Problem:   Diverticulitis of large intestine with abscess Active Problems:   History of factor V Leiden mutation   Acute urinary retention   Essential hypertension   Benign prostatic hyperplasia    Assessment and Plan: * Diverticulitis of large intestine with abscess 06/01/2024 admit to [facility] med[facility] inpatient bed. Continue with IV rocephin /po flagyl . General surgery consult. May need IR drain if surgery this he needs abscess drained. Will hold xarelto  and place on IV heparin gtts for now given his hx of Factor V Leiden and multiple DVTs. Continue with prn IV dilaudid  and prn po dilaudid . Add colace so he is not constipated from opiates. Clear liquid diet.   Acute urinary retention 06/01/2024 pt had about 300 ml in his bladder. C/o of urinary frequency and urgency. On CT imaging, the inflamed area of sigmoid colon and abscess are touching the posterior wall of  his bladder. Will place foley catheter for now. Can add Ditropan in adding of foley catheter does not help with urinary urgency symptoms.  *update. RN unable to place foley catheter. RN unable to pass Coude catheter. Required ED MD to place coude catheter with topical lidocaine and IV Ativan . Continue with foley. May need to have outpatient urology eval for voiding trial.   History of factor V Leiden mutation 06/01/2024 last dose of Xarelto  was AM of 05-31-2024. Will start IV heparin  gtts to prevent DVTs.   Benign prostatic hyperplasia 06/01/2024 continue flomax  0.4 mg daily.   Essential hypertension 06/01/2024 continue with cardizem . He will need med rec as he states he is no longer on losartan . He takes Olmesartan but does not know the dose. Add prn IV labetalol 10 mg q6h prn SBP >170 or DBP >100    DVT prophylaxis: IV heparin gtts Code Status: Full Code Family Communication: discussed with pt and his mother(who was at bedside) via [facility] video call  Disposition Plan: home  Consults called: general surgery - [place]  Admission status: Inpatient, Med-Surg   Camellia Door, DO [facility] 06/01/2024, 5:16 PM       [1]  Allergies Allergen Reactions   Bismuth Subsalicylate Swelling   Compazine  [Prochlorperazine  Edisylate] Anxiety and Other (See Comments)    Cervical dystonia and panic attacks, also   "

## 2024-06-01 NOTE — Assessment & Plan Note (Addendum)
 06/01/2024 pt had about 300 ml in his bladder. C/o of urinary frequency and urgency. On CT imaging, the inflamed area of sigmoid colon and abscess are touching the posterior wall of  his bladder. Will place foley catheter for now. Can add Ditropan in adding of foley catheter does not help with urinary urgency symptoms.  *update. RN unable to place foley catheter. RN unable to pass Coude catheter. Required ED MD to place coude catheter with topical lidocaine and IV Ativan . Continue with foley. May need to have outpatient urology eval for voiding trial.

## 2024-06-01 NOTE — Progress Notes (Signed)
 PHARMACY - ANTICOAGULATION CONSULT NOTE  Pharmacy Consult for heparin Indication: factor V leiden / DVT   Allergies[1]  Patient Measurements: Height: 5' 9 (175.3 cm) Weight: 87.5 kg (193 lb) IBW/kg (Calculated) : 70.7 HEPARIN DW (KG): 87.5  Vital Signs: Temp: 98.7 F (37.1 C) (01/01 0928) BP: 166/131 (01/01 0929) Pulse Rate: 100 (01/01 0929)  Labs: Recent Labs    06/01/24 0937  HGB 15.7  HCT 44.5  PLT 354  CREATININE 1.15    Estimated Creatinine Clearance: 80.4 mL/min (by C-G formula based on SCr of 1.15 mg/dL).   Medical History: Past Medical History:  Diagnosis Date   Acute DVT of left tibial vein (HCC) 04/24/2017   Xarelto  started (dx'd in emergency dept).  Dr. Timmy recommended full dose xarelto  x 9mo, with an additional year of maintenance dose xarelto  (as of 05/2017 initial consult note).  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  Pt Factor V leiden heterozygote.  Stable as of 09/2017 hem f/u.   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  Pt referred for sleep study.   Chronic renal insufficiency, stage II (mild)    GFR 60s-70s (suspect HTN and NSAID damage as etiology).  GFR dipped into 50s fall 2018 after lots of NSAID use for chronic daily HA's.   Diverticulitis 2016   CT 01/2015   Diverticulitis of colon 07/02/2015   Frequent headaches    Gastric polyp  2006   GERD without esophagitis    Herpes labialis    Hiatal hernia    Hyperlipidemia    HYPERTENSION 01/05/2007   OSA (obstructive sleep apnea) 04/2017   Dr. Buck eval 04/2017-plan for sleep study.   PSORIASIS 01/05/2007    Medications:  (Not in a hospital admission)  Scheduled:   buPROPion   450 mg Oral Daily   Chlorhexidine Gluconate Cloth  6 each Topical Daily   diltiazem   240 mg Oral Daily   losartan   25 mg Oral Daily   pantoprazole   40 mg Oral BID   Infusions:   lactated ringers  125 mL/hr at 06/01/24 1229   piperacillin -tazobactam (ZOSYN )  IV      Assessment: On Xarelto  prior to admission for FV Leiden with history of multiple DVTs. May have procedure. Pharmacy consulted to dose heparin. Last dose of Xarelto  0800 on 12/31.  Goal of Therapy:  Heparin level 0.3-0.7 units/ml Heparin level 66-102 units/ml Monitor platelets by anticoagulation protocol: Yes    Plan:  Start heparin infusion at 1500 units/hr Check anti-Xa level and aPTT in 6 hours and daily while on heparin Continue to monitor H&H and platelets  Benjamin [name] 06/01/2024,12:56 PM      [1]  Allergies Allergen Reactions   Bismuth Subsalicylate Swelling and Other (See Comments)   Compazine  [Prochlorperazine  Edisylate] Other (See Comments)    Cervical dystonia and panic attack

## 2024-06-01 NOTE — Progress Notes (Signed)
" ° °  EDP able to insert coude urinary catheter for urinary retention. Do not remove.  Camellia Door, DO [facility]  "

## 2024-06-01 NOTE — ED Notes (Signed)
 ED Provider at bedside.

## 2024-06-01 NOTE — Progress Notes (Signed)
" °  X-cover Note: Nursing having difficulty inserting routine foley catheter. Coude catheter tried by RN. Pt now retaining about 550 ml of urine.  Will have ER MD attempt.   Camellia Door, DO [facility]  "

## 2024-06-01 NOTE — ED Notes (Signed)
 Multiple attempts made for urinary catheter due to urinary retention with a bladder scan of 550 mL after an initial of 360 mL. Sterile technique used for 16 F straight tip catheter by myself with no success. Then, Cortney, RN attempted with sterile technique and a 16 F coude with no success either. Consulted this with Dr. Laurence and he asked if our provider Dr. Doretha could attempt. She was informed of this plan and agreeable to help relieve patient's bladder pressure. Suggested that a urology consult be put in as well.

## 2024-06-01 NOTE — Assessment & Plan Note (Addendum)
 06/01/2024 admit to [facility] med/surg inpatient bed. Continue with IV rocephin /po flagyl . General surgery consult. May need IR drain if surgery this he needs abscess drained. Will hold xarelto  and place on IV heparin gtts for now given his hx of Factor V Leiden and multiple DVTs. Continue with prn IV dilaudid  and prn po dilaudid . Add colace so he is not constipated from opiates. Clear liquid diet.

## 2024-06-01 NOTE — Consult Note (Signed)
 Reason for Consult:ab pain Referring Physician: Dr Laurence Lynwood [name] is an 54 y.o. male.  HPI: 7 yom with history of DVT and known diverticular disease.  He has had numerous diverticular episodes in past years.  Has been hospitalized in April 2025.  There was a small intramucosal abscess that he got better. He has colonoscopy in 8/24 that showed diverticulosis and one 5 mm t colon polyp.  He was seen by Dr Ann for this in June and discussed surgery but was not able to proceed then. Since then he has had some ab pain recently he took some augmentin  at home and is was better. Got worse yesterday and presented to ER has urinary urgency and now has catheter.  He has wbc of 11.8, ct shows diverticulititis and intramural abscess.  We were asked to see him Last took his xarelto  on Tuesday  Past Medical History:  Diagnosis Date   Acute DVT of left tibial vein (HCC) 04/24/2017   Xarelto  started (dx'd in emergency dept).  Dr. Timmy recommended full dose xarelto  x 62mo, with an additional year of maintenance dose xarelto  (as of 05/2017 initial consult note).  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  Pt Factor V leiden heterozygote.  Stable as of 09/2017 hem f/u.   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  Pt referred for sleep study.   Chronic renal insufficiency, stage II (mild)    GFR 60s-70s  (suspect HTN and NSAID damage as etiology).  GFR dipped into 50s fall 2018 after lots of NSAID use for chronic daily HA's.   Diverticulitis 2016   CT 01/2015   Diverticulitis of colon 07/02/2015   Frequent headaches    Gastric polyp 2006   GERD without esophagitis    Herpes labialis    Hiatal hernia    Hyperlipidemia    HYPERTENSION 01/05/2007   OSA (obstructive sleep apnea) 04/2017   Dr. Buck eval 04/2017-plan for sleep study.   PSORIASIS 01/05/2007    Past Surgical History:  Procedure Laterality Date   COLONOSCOPY W/ POLYPECTOMY  10/04/15   7mm tubular adenoma + diverticulosis: recall 5 yrs (Dr. Aneita)   ESOPHAGOGASTRODUODENOSCOPY  04/12/14   Barrett's esophagus   LIPOMA EXCISION     several, arm    Family History  Problem Relation Age of Onset   Hypertension Mother    CAD Father        around age 79   Heart disease Father    Hyperlipidemia Father    Hypertension Father    Hypertension Brother    Hyperlipidemia Brother    Lung cancer Maternal Grandmother    Colon cancer Maternal Grandfather    Cancer Maternal Grandfather        type unknown   CAD Paternal Grandmother    Diabetes Paternal Grandmother    Esophageal cancer Neg Hx    Stomach cancer Neg Hx  Social History:  reports that he has never smoked. He has never used smokeless tobacco. He reports current alcohol  use of about 2.0 standard drinks of alcohol  per week. He reports that he does not use drugs.  Allergies: Allergies[1]  Medications: Prior to Admission:  Medications Prior to Admission  Medication Sig Dispense Refill Last Dose/Taking   acetaminophen  (TYLENOL ) 500 MG tablet Take 500-1,000 mg by mouth every 6 (six) hours as needed for moderate pain (pain score 4-6).   Unknown   Aspirin-Acetaminophen  (GOODYS BODY PAIN PO) Take 1 packet by mouth See admin instructions. Goody's Cool Orange powder - Pour the contents of one packet on the tongue and dissolve orally twice a day as needed for pain. Follow  with water.   Unknown   benzonatate  (TESSALON ) 100 MG capsule Take 100 mg by mouth 3 (three) times daily as needed for cough.   Unknown   buPROPion  (WELLBUTRIN  XL) 150 MG 24 hr tablet Take 450 mg by mouth in the morning.   05/31/2024 Morning   butalbital -acetaminophen -caffeine  (FIORICET ) 50-325-40 MG tablet Take 1 tablet by mouth every 4 (four) hours as needed for headache. 14 tablet 0 Taking As Needed   clindamycin -benzoyl peroxide (BENZACLIN) gel Apply 1 Application topically See admin instructions. Apply in the morning as directed to any acne flares on the face   Unknown   diltiazem  (CARDIZEM  CD) 240 MG 24 hr capsule TAKE 1 CAPSULE BY MOUTH EVERY DAY (Patient taking differently: Take 240 mg by mouth in the morning.) 90 capsule 1 05/31/2024 Morning   fluconazole  (DIFLUCAN ) 200 MG tablet Take 200 mg by mouth every Monday.   05/29/2024   HYDROcodone -acetaminophen  (NORCO/VICODIN) 5-325 MG tablet Take 1 tablet by mouth every 8 (eight) hours as needed (for DIVERTICULITIS PAIN).   Unknown   KLAYESTA  powder Apply 1 Application topically 2 (two) times daily as needed (for rashes).   Unknown   NEXIUM  24HR 20 MG capsule Take 40 mg by mouth daily before breakfast.   05/31/2024 Morning   olmesartan (BENICAR) 40 MG tablet Take 40 mg by mouth daily.   05/31/2024 Morning   ondansetron  (ZOFRAN -ODT) 4 MG disintegrating tablet Take 1 tablet (4 mg total) by mouth every 8 (eight) hours as needed for nausea or vomiting. (Patient taking differently: Take 4 mg by mouth every 8 (eight) hours as needed for nausea or vomiting (DISSOLVE ORALLY).) 20 tablet 0 Unknown   rivaroxaban  (XARELTO ) 20 MG TABS tablet Take 1 tablet (20 mg total) by mouth daily with supper. (Patient taking differently: Take 20 mg by mouth daily with breakfast.) 30 tablet 0 05/31/2024 at  8:00 AM   rosuvastatin  (CRESTOR ) 40 MG tablet Take 1 tablet (40 mg total) by mouth once a week. (Patient taking differently: Take 40 mg by mouth once a week. Friday) 4  tablet 12 05/26/2024   tamsulosin  (FLOMAX ) 0.4 MG CAPS capsule Take 1 capsule (0.4 mg total) by mouth daily. 90 capsule 1 05/31/2024 Morning   tiZANidine  (ZANAFLEX ) 4 MG tablet Take 4 mg by mouth every 8 (eight) hours as needed for muscle spasms.   Unknown   valACYclovir  (VALTREX ) 500 MG tablet Take 1 tablet (500 mg total) by mouth daily. 30 tablet 12 05/31/2024 Morning   ZYRTEC -D ALLERGY & CONGESTION 5-120 MG tablet Take 1 tablet by mouth every 12 (twelve) hours as needed for allergies or rhinitis.   Unknown   losartan  (COZAAR ) 25 MG tablet Take 2 tablets (50 mg total) by mouth daily. (Patient not taking: Reported on 06/01/2024) 60 tablet  0 Not Taking    Results for orders placed or performed during the hospital encounter of 06/01/24 (from the past 48 hours)  Lipase, blood     Status: Abnormal   Collection Time: 06/01/24  9:37 AM  Result Value Ref Range   Lipase <10 (L) 11 - 51 U/L    Comment: Performed at [facility], [address] [place], [place] [address]  Comprehensive metabolic panel     Status: Abnormal   Collection Time: 06/01/24  9:37 AM  Result Value Ref Range   Sodium 139 135 - 145 mmol/L   Potassium 4.4 3.5 - 5.1 mmol/L   Chloride 104 98 - 111 mmol/L   CO2 22 22 - 32 mmol/L   Glucose, Bld 102 (H) 70 - 99 mg/dL    Comment: Glucose reference range applies only to samples taken after fasting for at least 8 hours.   BUN 17 6 - 20 mg/dL   Creatinine, Ser 8.84 0.61 - 1.24 mg/dL   Calcium  9.9 8.9 - 10.3 mg/dL   Total Protein 8.0 6.5 - 8.1 g/dL   Albumin 4.6 3.5 - 5.0 g/dL   AST 26 15 - 41 U/L    Comment: HEMOLYSIS AT THIS LEVEL MAY AFFECT RESULT   ALT 20 0 - 44 U/L   Alkaline Phosphatase 82 38 - 126 U/L   Total Bilirubin 0.5 0.0 - 1.2 mg/dL   GFR, Estimated >39 >39 mL/min    Comment: (NOTE) Calculated using the CKD-EPI Creatinine Equation (2021)    Anion gap 14 5 - 15    Comment: Performed at [facility], [address],  [place], [place] [address]  CBC     Status: Abnormal   Collection Time: 06/01/24  9:37 AM  Result Value Ref Range   WBC 11.8 (H) 4.0 - 10.5 K/uL   RBC 5.12 4.22 - 5.81 MIL/uL   Hemoglobin 15.7 13.0 - 17.0 g/dL   HCT 55.4 60.9 - 47.9 %   MCV 86.9 80.0 - 100.0 fL   MCH 30.7 26.0 - 34.0 pg   MCHC 35.3 30.0 - 36.0 g/dL   RDW 87.6 88.4 - 84.4 %   Platelets 354 150 - 400 K/uL   nRBC 0.0 0.0 - 0.2 %    Comment: Performed at [facility], [address], [place], [place] [address]  Urinalysis, Routine w reflex microscopic -Urine, Clean Catch     Status: Abnormal   Collection Time: 06/01/24 10:30 AM  Result Value Ref Range   Color, Urine YELLOW YELLOW   APPearance CLEAR CLEAR   Specific Gravity, Urine 1.010 1.005 - 1.030   pH 6.5 5.0 - 8.0   Glucose, UA NEGATIVE NEGATIVE mg/dL   Hgb urine dipstick TRACE (A) NEGATIVE   Bilirubin Urine NEGATIVE NEGATIVE   Ketones, ur NEGATIVE NEGATIVE mg/dL   Protein, ur NEGATIVE NEGATIVE mg/dL   Nitrite NEGATIVE NEGATIVE   Leukocytes,Ua NEGATIVE NEGATIVE   RBC / HPF 0-5 0 - 5 RBC/hpf   WBC, UA 0-5 0 - 5 WBC/hpf   Bacteria, UA NONE SEEN NONE SEEN   Squamous Epithelial / HPF 0-5 0 - 5 /HPF   Mucus PRESENT     Comment: Performed at [facility], [address] [place], [place] [address]    CT ABDOMEN PELVIS W CONTRAST Result Date: 06/01/2024 CLINICAL DATA:  Left lower quadrant abdominal pain. History of diverticulitis. EXAM: CT ABDOMEN AND PELVIS WITH CONTRAST TECHNIQUE: Multidetector CT imaging of the abdomen and pelvis  was performed using the standard protocol following bolus administration of intravenous contrast. RADIATION DOSE REDUCTION: This exam was performed according to the departmental dose-optimization program which includes automated exposure control, adjustment of the mA and/or kV according to patient size and/or use of iterative reconstruction technique. CONTRAST:  OMNIPAQUE  IOHEXOL  300 MG/ML  SOLN  COMPARISON:  03/08/2024. FINDINGS: Lower chest: No acute abnormality. Hepatobiliary: No focal liver abnormality is seen. No gallstones, gallbladder wall thickening, or biliary dilatation. Pancreas: Unremarkable. No pancreatic ductal dilatation or surrounding inflammatory changes. Spleen: Unremarkable. Adrenals/Urinary Tract: Adrenal glands are unremarkable. Unchanged bilateral nonobstructive renal calculi. No ureteral calculi. No hydronephrosis. Bladder is unremarkable. Stomach/Bowel: Acute diverticulitis involving the mid sigmoid colon with submucosal edema and findings suspicious for a peripherally enhancing intramural abscess measuring approximately 3.0 x 1.9 cm. Similar findings were noted on the prior exam. No appreciable evidence of perforation. Diverticulosis of the sigmoid and descending colon. Stomach is within normal limits. No obstruction. Appendix appears normal. Vascular/Lymphatic: Abdominal aorta is normal in caliber with mild aortoiliac atherosclerotic calcification. Similar appearance of small scattered mesenteric and retroperitoneal lymph nodes, not definitively enlarged by size criteria. Reproductive: Prostate is unremarkable. Other: No abdominopelvic ascites.  No intraperitoneal free air. Musculoskeletal: No acute or significant osseous findings. IMPRESSION: 1. Acute diverticulitis involving the mid sigmoid colon with suspected peripherally enhancing intramural abscess measuring approximately 3.0 x 1.9 cm. Similar findings were noted on the prior exam. No evidence of perforation. 2. Unchanged nonobstructive bilateral renal calculi. No hydronephrosis. Electronically Signed   By: Harrietta Sherry M.D.   On: 06/01/2024 11:42    Review of Systems  Gastrointestinal:  Positive for abdominal pain.  All other systems reviewed and are negative.  Blood pressure (!) 167/100, pulse (!) 113, temperature 98.1 F (36.7 C), temperature source Oral, resp. rate 18, height 5' 9 (1.753 m), weight 87.5 kg,  SpO2 96%. Physical Exam Vitals reviewed.  Constitutional:      Appearance: He is well-developed.  Eyes:     General: No scleral icterus.    Pupils: Pupils are equal, round, and reactive to light.  Cardiovascular:     Rate and Rhythm: Normal rate and regular rhythm.  Pulmonary:     Effort: Pulmonary effort is normal.  Abdominal:     Palpations: Abdomen is soft.     Tenderness: There is abdominal tenderness in the left lower quadrant.     Hernia: No hernia is present.  Skin:    General: Skin is warm and dry.     Capillary Refill: Capillary refill takes less than 2 seconds.  Neurological:     General: No focal deficit present.     Mental Status: He is alert.  Psychiatric:        Mood and Affect: Mood normal.        Behavior: Behavior normal.     Assessment/Plan: Diverticulitis, complicated -he is just going to need surgery for this. Timing only thing TBD. -heparin fine, hold xarelto  -abx as discussed with Dr Laurence -can have clears from my standpoint -scds  I reviewed ED provider notes, hospitalist notes, last 24 h vitals and pain scores, last 24 h labs and trends, and last 24 h imaging results.   Benjamin [name] 06/01/2024, 5:36 PM         [1]  Allergies Allergen Reactions   Bismuth Subsalicylate Anaphylaxis, Swelling and Other (See Comments)    The throat swells   Compazine  [Prochlorperazine  Edisylate] Anxiety and Other (See Comments)    Cervical dystonia and  panic attacks, also

## 2024-06-01 NOTE — ED Provider Notes (Signed)
 " [name] EMERGENCY DEPARTMENT AT [facility] Provider Note   CSN: [id number] Arrival date & time: 06/01/24  9078     Patient presents with: Abdominal Pain   Benjamin [name] is a 54 y.o. male.   Patient is a 55 year old male with a history of recurrent diverticulitis with abscess waiting for more definitive therapy with surgery, chronic renal insufficiency, GERD, DVT on Xarelto  who is presenting today with complaints of abdominal pain.  Patient last had diverticular abscess in October.  He does report 2 weeks ago he was having some abdominal pain and took Augmentin  for 3 days with resolution of his symptoms however he was out of town in [place] and started having abdominal pain yesterday evening and reports that this morning the pain is a 9 out of 10 in the left lower quadrant and radiates to the right quadrant.  He has had some nausea but denies any vomiting.  Stools have been regular until today he had some diarrhea.  He has not had fever.  Denies any urinary symptoms.  Movement seems to make the pain a little bit worse.  The history is provided by the patient and medical records.  Abdominal Pain      Prior to Admission medications  Medication Sig Start Date End Date Taking? Authorizing Provider  acetaminophen  (TYLENOL ) 500 MG tablet Take 500-1,000 mg by mouth every 6 (six) hours as needed for moderate pain (pain score 4-6).    [provider]  buPROPion  (WELLBUTRIN  XL) 150 MG 24 hr tablet Take 450 mg by mouth daily.    [provider]  butalbital -acetaminophen -caffeine  (FIORICET ) 50-325-40 MG tablet Take 1 tablet by mouth every 4 (four) hours as needed for headache. 09/25/23   Samtani, Jai-Gurmukh, MD  cetirizine  (ZYRTEC ) 10 MG tablet Take 10 mg by mouth daily as needed for allergies.    [provider]  diltiazem  (CARDIZEM  CD) 240 MG 24 hr capsule TAKE 1 CAPSULE BY MOUTH EVERY DAY 02/19/21   Patwardhan, Manish J, MD  esomeprazole  (NEXIUM ) 20 MG  capsule Take 40 mg by mouth daily at 12 noon.    [provider]  losartan  (COZAAR ) 25 MG tablet Take 2 tablets (50 mg total) by mouth daily. Patient taking differently: Take 25 mg by mouth daily. 09/27/22 03/09/24  Ezenduka, Nkeiruka J, MD  ondansetron  (ZOFRAN -ODT) 4 MG disintegrating tablet Take 1 tablet (4 mg total) by mouth every 8 (eight) hours as needed for nausea or vomiting. 08/12/23   Schutt, Marsa HERO, PA-C  rivaroxaban  (XARELTO ) 20 MG TABS tablet Take 1 tablet (20 mg total) by mouth daily with supper. 10/04/22   Yolande Lamar BROCKS, MD  rosuvastatin  (CRESTOR ) 40 MG tablet Take 1 tablet (40 mg total) by mouth once a week. Patient taking differently: Take 40 mg by mouth once a week. Friday 12/27/17   Gretta Ozell CROME, PA-C  tamsulosin  (FLOMAX ) 0.4 MG CAPS capsule Take 1 capsule (0.4 mg total) by mouth daily. 04/05/18   Starla Grate D, PA-C  terbinafine  (LAMISIL ) 250 MG tablet Take 250 mg by mouth daily.    [provider]  tiZANidine  (ZANAFLEX ) 4 MG tablet Take 4 mg by mouth every 8 (eight) hours as needed for muscle spasms. Patient not taking: Reported on 03/09/2024    [provider]  valACYclovir  (VALTREX ) 500 MG tablet Take 1 tablet (500 mg total) by mouth daily. 12/30/17   Gretta Ozell CROME, PA-C  lisinopril -hydrochlorothiazide  (PRINZIDE ,ZESTORETIC ) 20-25 MG per tablet Take 1 tablet by mouth daily. 07/24/11  08/21/11  Jame Maude FALCON, MD    Allergies: Bismuth subsalicylate and Compazine  [prochlorperazine  edisylate]    Review of Systems  Gastrointestinal:  Positive for abdominal pain.    Updated Vital Signs BP (!) 166/131 (BP Location: Right Arm)   Pulse 100   Temp 98.7 F (37.1 C)   Resp 20   Ht 5' 9 (1.753 m)   Wt 87.5 kg   SpO2 99%   BMI 28.50 kg/m   Physical Exam Constitutional:      General: He is not in acute distress.    Appearance: He is well-developed.  HENT:     Head: Normocephalic and atraumatic.     Right Ear: External ear  normal.     Left Ear: External ear normal.  Eyes:     General:        Right eye: No discharge.     Conjunctiva/sclera: Conjunctivae normal.     Pupils: Pupils are equal, round, and reactive to light.  Cardiovascular:     Rate and Rhythm: Regular rhythm. Tachycardia present.     Heart sounds: Normal heart sounds. No murmur heard. Pulmonary:     Effort: Tachypnea present. No respiratory distress.     Breath sounds: No decreased breath sounds, wheezing, rhonchi or rales.  Abdominal:     Palpations: Abdomen is soft.     Tenderness: There is abdominal tenderness in the suprapubic area and left lower quadrant. There is guarding and rebound. There is no left CVA tenderness.  Musculoskeletal:        General: No tenderness. Normal range of motion.     Cervical back: Normal range of motion and neck supple.  Skin:    General: Skin is warm and dry.     Findings: No rash.  Neurological:     Mental Status: He is alert and oriented to person, place, and time. Mental status is at baseline.     (all labs ordered are listed, but only abnormal results are displayed) Labs Reviewed  LIPASE, BLOOD - Abnormal; Notable for the following components:      Result Value   Lipase <10 (*)    All other components within normal limits  COMPREHENSIVE METABOLIC PANEL WITH GFR - Abnormal; Notable for the following components:   Glucose, Bld 102 (*)    All other components within normal limits  CBC - Abnormal; Notable for the following components:   WBC 11.8 (*)    All other components within normal limits  URINALYSIS, ROUTINE W REFLEX MICROSCOPIC - Abnormal; Notable for the following components:   Hgb urine dipstick TRACE (*)    All other components within normal limits    EKG: None  Radiology: CT ABDOMEN PELVIS W CONTRAST Result Date: 06/01/2024 CLINICAL DATA:  Left lower quadrant abdominal pain. History of diverticulitis. EXAM: CT ABDOMEN AND PELVIS WITH CONTRAST TECHNIQUE: Multidetector CT imaging of  the abdomen and pelvis was performed using the standard protocol following bolus administration of intravenous contrast. RADIATION DOSE REDUCTION: This exam was performed according to the departmental dose-optimization program which includes automated exposure control, adjustment of the mA and/or kV according to patient size and/or use of iterative reconstruction technique. CONTRAST:  OMNIPAQUE  IOHEXOL  300 MG/ML  SOLN COMPARISON:  03/08/2024. FINDINGS: Lower chest: No acute abnormality. Hepatobiliary: No focal liver abnormality is seen. No gallstones, gallbladder wall thickening, or biliary dilatation. Pancreas: Unremarkable. No pancreatic ductal dilatation or surrounding inflammatory changes. Spleen: Unremarkable. Adrenals/Urinary Tract: Adrenal glands are unremarkable. Unchanged bilateral nonobstructive renal calculi.  No ureteral calculi. No hydronephrosis. Bladder is unremarkable. Stomach/Bowel: Acute diverticulitis involving the mid sigmoid colon with submucosal edema and findings suspicious for a peripherally enhancing intramural abscess measuring approximately 3.0 x 1.9 cm. Similar findings were noted on the prior exam. No appreciable evidence of perforation. Diverticulosis of the sigmoid and descending colon. Stomach is within normal limits. No obstruction. Appendix appears normal. Vascular/Lymphatic: Abdominal aorta is normal in caliber with mild aortoiliac atherosclerotic calcification. Similar appearance of small scattered mesenteric and retroperitoneal lymph nodes, not definitively enlarged by size criteria. Reproductive: Prostate is unremarkable. Other: No abdominopelvic ascites.  No intraperitoneal free air. Musculoskeletal: No acute or significant osseous findings. IMPRESSION: 1. Acute diverticulitis involving the mid sigmoid colon with suspected peripherally enhancing intramural abscess measuring approximately 3.0 x 1.9 cm. Similar findings were noted on the prior exam. No evidence of  perforation. 2. Unchanged nonobstructive bilateral renal calculi. No hydronephrosis. Electronically Signed   By: Harrietta Sherry M.D.   On: 06/01/2024 11:42     Procedures   Medications Ordered in the ED  piperacillin -tazobactam (ZOSYN ) IVPB 3.375 g (3.375 g Intravenous New Bag/Given 06/01/24 1159)  piperacillin -tazobactam (ZOSYN ) IVPB 3.375 g (has no administration in time range)  HYDROmorphone  (DILAUDID ) injection 1 mg (has no administration in time range)  ondansetron  (ZOFRAN ) injection 4 mg (4 mg Intravenous Given 06/01/24 0950)  HYDROmorphone  (DILAUDID ) injection 1 mg (1 mg Intravenous Given 06/01/24 0951)  lactated ringers  bolus 1,000 mL (0 mLs Intravenous Stopped 06/01/24 1200)  iohexol  (OMNIPAQUE ) 300 MG/ML solution 100 mL (100 mLs Intravenous Contrast Given 06/01/24 1029)  HYDROmorphone  (DILAUDID ) injection 1 mg (1 mg Intravenous Given 06/01/24 1040)                                    Medical Decision Making Amount and/or Complexity of Data Reviewed External Data Reviewed: notes. Labs: ordered. Decision-making details documented in ED Course. Radiology: ordered and independent interpretation performed. Decision-making details documented in ED Course.  Risk Prescription drug management.   Pt with multiple medical problems and comorbidities and presenting today with a complaint that caries a high risk for morbidity and mortality.  Here today with the above complaints.  Concern for perforation, recurrent diverticulitis, renal stone, abdominal abscess.  Lower suspicion for AAA, mesenteric ischemia.  Patient given pain control, fluids, labs and imaging are pending. I independently interpreted patient's labs and UA without acute findings, lipase is normal, CMP within normal limits, CBC with minimal leukocytosis of 11.8.  I have independently visualized and interpreted pt's images today. CT with evidence of diverticulitis without perforation.  Radiology reports intramural abscess with acute  diverticulitis involving the mid sigmoid colon.  The abscess is approximately 3 x 1.9 cm similar to prior studies.  Patient given a dose of Zosyn .  Call for admission to the hospitalist for further antibiotics and treatment of his diverticulitis.      Final diagnoses:  Acute diverticulitis    ED Discharge Orders     None          Doretha Folks, MD 06/01/24 1248  "

## 2024-06-01 NOTE — ED Triage Notes (Signed)
 Pt caox4 c/o LLQ abd pain since last night with nausea reporting s/s are consistent with previous diverticulitis hospitalizations. Denies fever.

## 2024-06-01 NOTE — Progress Notes (Signed)
 PHARMACY - ANTICOAGULATION CONSULT NOTE  Pharmacy Consult for heparin Indication: factor V leiden / DVT   Allergies[1]  Patient Measurements: Height: 5' 9 (175.3 cm) Weight: 87.5 kg (193 lb) IBW/kg (Calculated) : 70.7 HEPARIN DW (KG): 87.5  Vital Signs: Temp: 98 F (36.7 C) (01/01 2017) Temp Source: Oral (01/01 2017) BP: 159/119 (01/01 2017) Pulse Rate: 101 (01/01 2017)  Labs: Recent Labs    06/01/24 0937 06/01/24 2025  HGB 15.7  --   HCT 44.5  --   PLT 354  --   APTT  --  56*  HEPARINUNFRC  --  0.62  CREATININE 1.15  --     Estimated Creatinine Clearance: 80.4 mL/min (by C-G formula based on SCr of 1.15 mg/dL).   Medications:  Medications Prior to Admission  Medication Sig Dispense Refill Last Dose/Taking   acetaminophen  (TYLENOL ) 500 MG tablet Take 500-1,000 mg by mouth every 6 (six) hours as needed for moderate pain (pain score 4-6).   Unknown   Aspirin-Acetaminophen  (GOODYS BODY PAIN PO) Take 1 packet by mouth See admin instructions. Goody's Cool Orange powder - Pour the contents of one packet on the tongue and dissolve orally twice a day as needed for pain. Follow with water.   Unknown   benzonatate  (TESSALON ) 100 MG capsule Take 100 mg by mouth 3 (three) times daily as needed for cough.   Unknown   buPROPion  (WELLBUTRIN  XL) 150 MG 24 hr tablet Take 450 mg by mouth in the morning.   05/31/2024 Morning   butalbital -acetaminophen -caffeine  (FIORICET ) 50-325-40 MG tablet Take 1 tablet by mouth every 4 (four) hours as needed for headache. 14 tablet 0 Taking As Needed   clindamycin -benzoyl peroxide (BENZACLIN) gel Apply 1 Application topically See admin instructions. Apply in the morning as directed to any acne flares on the face   Unknown   diltiazem  (CARDIZEM  CD) 240 MG 24 hr capsule TAKE 1 CAPSULE BY MOUTH EVERY DAY (Patient taking differently: Take 240 mg by mouth in the morning.) 90 capsule 1 05/31/2024 Morning   fluconazole  (DIFLUCAN ) 200 MG tablet Take 200 mg by  mouth every Monday.   05/29/2024   HYDROcodone -acetaminophen  (NORCO/VICODIN) 5-325 MG tablet Take 1 tablet by mouth every 8 (eight) hours as needed (for DIVERTICULITIS PAIN).   Unknown   KLAYESTA  powder Apply 1 Application topically 2 (two) times daily as needed (for rashes).   Unknown   NEXIUM  24HR 20 MG capsule Take 40 mg by mouth daily before breakfast.   05/31/2024 Morning   olmesartan (BENICAR) 40 MG tablet Take 40 mg by mouth daily.   05/31/2024 Morning   ondansetron  (ZOFRAN -ODT) 4 MG disintegrating tablet Take 1 tablet (4 mg total) by mouth every 8 (eight) hours as needed for nausea or vomiting. (Patient taking differently: Take 4 mg by mouth every 8 (eight) hours as needed for nausea or vomiting (DISSOLVE ORALLY).) 20 tablet 0 Unknown   rivaroxaban  (XARELTO ) 20 MG TABS tablet Take 1 tablet (20 mg total) by mouth daily with supper. (Patient taking differently: Take 20 mg by mouth daily with breakfast.) 30 tablet 0 05/31/2024 at  8:00 AM   rosuvastatin  (CRESTOR ) 40 MG tablet Take 1 tablet (40 mg total) by mouth once a week. (Patient taking differently: Take 40 mg by mouth once a week. Friday) 4 tablet 12 05/26/2024   tamsulosin  (FLOMAX ) 0.4 MG CAPS capsule Take 1 capsule (0.4 mg total) by mouth daily. 90 capsule 1 05/31/2024 Morning   tiZANidine  (ZANAFLEX ) 4 MG tablet Take 4 mg by  mouth every 8 (eight) hours as needed for muscle spasms.   Unknown   valACYclovir  (VALTREX ) 500 MG tablet Take 1 tablet (500 mg total) by mouth daily. 30 tablet 12 05/31/2024 Morning   ZYRTEC -D ALLERGY & CONGESTION 5-120 MG tablet Take 1 tablet by mouth every 12 (twelve) hours as needed for allergies or rhinitis.   Unknown   losartan  (COZAAR ) 25 MG tablet Take 2 tablets (50 mg total) by mouth daily. (Patient not taking: Reported on 06/01/2024) 60 tablet 0 Not Taking   Scheduled:   buPROPion   450 mg Oral Daily   Chlorhexidine Gluconate Cloth  6 each Topical Daily   diltiazem   240 mg Oral Daily   docusate sodium   100 mg  Oral BID   metroNIDAZOLE   500 mg Oral Q12H   pantoprazole   40 mg Oral BID   [START ON 06/02/2024] tamsulosin   0.4 mg Oral Daily   Infusions:   cefTRIAXone  (ROCEPHIN )  IV 2 g (06/01/24 1739)   heparin 1,500 Units/hr (06/01/24 1332)   lactated ringers  125 mL/hr at 06/01/24 1229    Assessment: On Xarelto  prior to admission for [facility] Leiden with history of multiple DVTs. May have procedure. Pharmacy consulted to dose heparin.  Baseline INR, aPTT: not done Prior anticoagulation: Xarelto  20 mg daily with breakfast; last dose 12/31 AM  Significant events:  Today, 06/01/2024: CBC: WNL Initial aPTT subtherapeutic on 1500 units/hr Initial anti-Xa (heparin) level therapeutic, but likely falsely elevated d/t recent FXa inhibitor No bleeding or infusion issues per nursing; no pauses noted in infusion since initiation  Goal of Therapy: Heparin level 0.3-0.7 units/ml aPTT 66-102 sec Monitor platelets by anticoagulation protocol: Yes  Plan: IV heparin 2000 unit bolus x 1 Increase heparin IV infusion to 1800 units/hr Check aPTT 6 hrs after rate change Daily CBC and heparin level; aPTT as needed until DOAC effects dissipate Monitor for signs of bleeding or thrombosis  Bard Jeans, PharmD, BCPS [phone] 06/01/2024, 9:27 PM    [1]  Allergies Allergen Reactions   Bismuth Subsalicylate Anaphylaxis, Swelling and Other (See Comments)    The throat swells   Compazine  [Prochlorperazine  Edisylate] Anxiety and Other (See Comments)    Cervical dystonia and panic attacks, also

## 2024-06-01 NOTE — Assessment & Plan Note (Addendum)
 06/01/2024 continue flomax  0.4 mg daily.

## 2024-06-01 NOTE — Plan of Care (Signed)

## 2024-06-01 NOTE — ED Notes (Signed)
 Called Benjamin [name] at [facility] for transport 14:28-TC

## 2024-06-02 DIAGNOSIS — K5720 Diverticulitis of large intestine with perforation and abscess without bleeding: Secondary | ICD-10-CM | POA: Diagnosis not present

## 2024-06-02 LAB — COMPREHENSIVE METABOLIC PANEL WITH GFR
ALT: 18 U/L (ref 0–44)
AST: 19 U/L (ref 15–41)
Albumin: 4.1 g/dL (ref 3.5–5.0)
Alkaline Phosphatase: 71 U/L (ref 38–126)
Anion gap: 12 (ref 5–15)
BUN: 11 mg/dL (ref 6–20)
CO2: 23 mmol/L (ref 22–32)
Calcium: 9.1 mg/dL (ref 8.9–10.3)
Chloride: 98 mmol/L (ref 98–111)
Creatinine, Ser: 1.01 mg/dL (ref 0.61–1.24)
GFR, Estimated: >60 mL/min
Glucose, Bld: 112 mg/dL — ABNORMAL HIGH (ref 70–99)
Potassium: 3.9 mmol/L (ref 3.5–5.1)
Sodium: 133 mmol/L — ABNORMAL LOW (ref 135–145)
Total Bilirubin: 0.8 mg/dL (ref 0.0–1.2)
Total Protein: 7 g/dL (ref 6.5–8.1)

## 2024-06-02 LAB — CBC WITH DIFFERENTIAL/PLATELET
Abs Immature Granulocytes: 0.06 K/uL (ref 0.00–0.07)
Basophils Absolute: 0.1 K/uL (ref 0.0–0.1)
Basophils Relative: 1 %
Eosinophils Absolute: 0.1 K/uL (ref 0.0–0.5)
Eosinophils Relative: 1 %
HCT: 41.6 % (ref 39.0–52.0)
Hemoglobin: 14.2 g/dL (ref 13.0–17.0)
Immature Granulocytes: 0 %
Lymphocytes Relative: 9 %
Lymphs Abs: 1.4 K/uL (ref 0.7–4.0)
MCH: 30.4 pg (ref 26.0–34.0)
MCHC: 34.1 g/dL (ref 30.0–36.0)
MCV: 89.1 fL (ref 80.0–100.0)
Monocytes Absolute: 0.9 K/uL (ref 0.1–1.0)
Monocytes Relative: 6 %
Neutro Abs: 13.3 K/uL — ABNORMAL HIGH (ref 1.7–7.7)
Neutrophils Relative %: 83 %
Platelets: 291 K/uL (ref 150–400)
RBC: 4.67 MIL/uL (ref 4.22–5.81)
RDW: 12.9 % (ref 11.5–15.5)
WBC: 15.9 K/uL — ABNORMAL HIGH (ref 4.0–10.5)
nRBC: 0 % (ref 0.0–0.2)

## 2024-06-02 LAB — APTT
aPTT: 122 s — ABNORMAL HIGH (ref 24–36)
aPTT: 104 s — ABNORMAL HIGH (ref 24–36)

## 2024-06-02 LAB — HEPARIN LEVEL (UNFRACTIONATED): Heparin Unfractionated: 0.98 [IU]/mL — ABNORMAL HIGH (ref 0.30–0.70)

## 2024-06-02 MED ORDER — VALACYCLOVIR HCL 500 MG PO TABS
500.0000 mg | ORAL_TABLET | Freq: Every day | ORAL | Status: DC
  Administered 2024-06-02 – 2024-06-09 (×8): 500 mg via ORAL
  Filled 2024-06-02 (×8): qty 1

## 2024-06-02 MED ORDER — FLUCONAZOLE 100 MG PO TABS
200.0000 mg | ORAL_TABLET | ORAL | Status: DC
  Administered 2024-06-05: 200 mg via ORAL
  Filled 2024-06-02 (×4): qty 2

## 2024-06-02 MED ORDER — ROSUVASTATIN CALCIUM 20 MG PO TABS
40.0000 mg | ORAL_TABLET | Freq: Every day | ORAL | Status: DC
  Administered 2024-06-02 – 2024-06-08 (×7): 40 mg via ORAL
  Filled 2024-06-02 (×8): qty 2

## 2024-06-02 MED ORDER — PIPERACILLIN-TAZOBACTAM 3.375 G IVPB
3.3750 g | Freq: Three times a day (TID) | INTRAVENOUS | Status: DC
  Administered 2024-06-02 – 2024-06-09 (×22): 3.375 g via INTRAVENOUS
  Filled 2024-06-02 (×22): qty 50

## 2024-06-02 MED ORDER — LACTATED RINGERS IV SOLN: INTRAVENOUS | Status: AC

## 2024-06-02 MED ORDER — HYDROMORPHONE HCL 1 MG/ML IJ SOLN
0.5000 mg | INTRAMUSCULAR | Status: DC | PRN
  Administered 2024-06-03 – 2024-06-06 (×4): 0.5 mg via INTRAVENOUS
  Filled 2024-06-02 (×4): qty 0.5

## 2024-06-02 MED ORDER — HYDRALAZINE HCL 25 MG PO TABS
25.0000 mg | ORAL_TABLET | Freq: Four times a day (QID) | ORAL | Status: DC | PRN
  Administered 2024-06-06: 25 mg via ORAL
  Filled 2024-06-02: qty 1

## 2024-06-02 MED ORDER — OXYCODONE HCL 5 MG PO TABS: 5.0000 mg | ORAL_TABLET | ORAL | Status: DC | PRN

## 2024-06-02 MED ORDER — ACETAMINOPHEN 650 MG RE SUPP: 650.0000 mg | Freq: Four times a day (QID) | RECTAL | Status: DC

## 2024-06-02 MED ORDER — HYDROMORPHONE HCL 1 MG/ML IJ SOLN: 1.0000 mg | INTRAMUSCULAR | Status: DC | PRN

## 2024-06-02 MED ORDER — HYDROCODONE-ACETAMINOPHEN 5-325 MG PO TABS
1.0000 | ORAL_TABLET | ORAL | Status: DC | PRN
  Administered 2024-06-02 (×2): 2 via ORAL
  Administered 2024-06-03 (×2): 1 via ORAL
  Administered 2024-06-03: 2 via ORAL
  Administered 2024-06-04 – 2024-06-05 (×3): 1 via ORAL
  Administered 2024-06-06 – 2024-06-09 (×11): 2 via ORAL
  Filled 2024-06-02: qty 1
  Filled 2024-06-02: qty 2
  Filled 2024-06-02: qty 1
  Filled 2024-06-02: qty 2
  Filled 2024-06-02: qty 1
  Filled 2024-06-02 (×3): qty 2
  Filled 2024-06-02 (×2): qty 1
  Filled 2024-06-02 (×7): qty 2
  Filled 2024-06-02: qty 1
  Filled 2024-06-02 (×3): qty 2

## 2024-06-02 MED ORDER — ACETAMINOPHEN 500 MG PO TABS
1000.0000 mg | ORAL_TABLET | Freq: Four times a day (QID) | ORAL | Status: DC
  Administered 2024-06-02 – 2024-06-04 (×6): 1000 mg via ORAL
  Filled 2024-06-02 (×10): qty 2

## 2024-06-02 NOTE — Progress Notes (Signed)
 " PROGRESS NOTE    GRAVES NIPP  FMW:[id number] DOB: [birth year] DOA: 06/01/2024 PCP: Gretta Ozell CROME, PA-C    Brief Narrative:   Benjamin [name] is a 54 y.o. 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  Onset night prior.  History of recurrent bouts of diverticulitis.  Patient reports he took some Augmentin  he had at home for few days about 1 week ago that resolved his pain.  But since pain recurred overnight.  Has been seen outpatient by [facility] surgery, Dr. Ebb in June 2025.  In the ED, temperature 98.7 F, HR 126, RR 20, BP 191/128, SpO2 98% on room air.  WBC 11.8, hemoglobin 15.7, plate count 645.  Sodium 139, potassium 4.4, chloride 104, CO2 22, glucose 102, BUN 17, creat 1.15.  AST 26, ALT 20, total bilirubin 0.5.  Lipase less than 10.  Urinalysis unrevealing.  CT abdomen/pelvis with contrast with acute diverticulitis involving the mid sigmoid colon with suspected peripherally enhancing intramural abscess measuring approximately 3.0 x 1.9 cm, no evidence of perforation.  Assessment & Plan:   Acute diverticulitis with intramural abscess, recurrent Patient presenting with recurrent left lower quadrant abdominal pain, onset night prior to ED presentation.  But does report recurrence of pain 1 week ago in which she took Augmentin  with resolution of symptoms.  Patient has afebrile with elevated WBC count of 11.8.  CT abdomen/pelvis with acute diverticulitis mid sigmoid colon with intramural abscess measuring 3.0 x 1.9 cm, no evidence of perforation. -- General Surgery following, appreciate assistance -- WBC 11.8>15.9 -- Change ceftriaxone /Flagyl  to Zosyn  3.375 g IV q8h given rise in WBC count -- Holding Xarelto  -- Clear liquid diet -- Hydrocodone -acetaminophen  5/325 mg 1-2 tablets q4h PRN  moderate/severe pain -- Dilaudid  0.5 mg IV every 2 hours as needed severe breakthrough pain not relieved with oral medication -- May need inpatient surgical invention vs outpatient follow-up based on response to antibiotics -- Further per general surgery  Acute urinary retention Patient developed urinary retention while boarding in the ED with bladder scan greater than 300 mL retained.  Initial attempts at In-N-Out catheterization unsuccessful by RN, Foley placed coud catheter by ER MD.  Suspect etiology to his obstructive process related to acute inflammatory changes within his abdomen from acute diverticulitis as above.  CT ab/pelvis with no hydronephrosis. -- Continue Foley catheterization given difficulty on placement with acute infectious process as above. -- Tamsulosin  0.4 g p.o. daily -- Oxybutynin 5 mg p.o. every 8 hours as needed bladder spasms  History of factor V Leiden/recurrent DVT On Xarelto  outpatient. -- Continue to hold Xarelto  potential need of surgical intervention -- Heparin drip, pharmacy consulted for dosing/monitoring  HTN --Continue Cardizem  240 mg p.o. daily -- hold home olmesatan-medoxomil -- Hydralazine  20 mg p.o. q6h PRN SBP >165  History of headache -- Fioricet  as needed  Depression -- Wellbutrin  450 mg PO daily  GERD -- Protonix  40 mg p.o. twice daily (substituted for home Nexium )   DVT prophylaxis: SCDs Start: 06/01/24 1622    Code Status: Full Code Family Communication: Updated spouse present at bedside this morning  Disposition Plan:  Level of care: Med-Surg Status is: Inpatient Remains inpatient appropriate because: IV antibiotics, may need surgical invention    Consultants:  General Surgery  Procedures:  None  Antimicrobials:  Ceftriaxone  Metronidazole  Zosyn  1/2>>   Subjective: Patient seen  examined bedside, lying in bed.  Continues with mild abdominal discomfort.  Spouse present at bedside.  WC count rising, will change  ceftriaxone /metronidazole  to Zosyn  today.  Seen by general surgery, hoping that patient will have good response/improvement with IV antibiotics in order to follow-up outpatient for surgical resection, but if does not improve may need acute inpatient intervention.  Patient requesting Fioricet , change of oxycodone  to hydrocodone .  No other questions or concerns at this time.  Denies current headache, no dizziness, no chest pain, no palpitations, no fever/chills/night sweats, no nausea/vomiting/diarrhea, no focal weakness, no fatigue, no paresthesia.  No acute events overnight per nursing.  Objective: Vitals:   06/01/24 2017 06/02/24 0119 06/02/24 0509 06/02/24 0953  BP: (!) 159/119 (!) 157/122 (!) 156/118 (!) 130/92  Pulse: (!) 101 (!) 108 93 95  Resp: 15 15 15 18   Temp: 98 F (36.7 C) 98.3 F (36.8 C) 98.1 F (36.7 C) 98.6 F (37 C)  TempSrc: Oral Oral Oral   SpO2: 99% 94% 94% 97%  Weight:      Height:        Intake/Output Summary (Last 24 hours) at 06/02/2024 1211 Last data filed at 06/02/2024 1000 Gross per 24 hour  Intake 2937.54 ml  Output 3000 ml  Net -62.46 ml   Filed Weights   06/01/24 0927  Weight: 87.5 kg    Examination:  Physical Exam: GEN: NAD, alert and oriented x 3, wd/wn HEENT: NCAT, PERRL, EOMI, sclera clear, MMM PULM: CTAB w/o wheezes/crackles, normal respiratory effort on room air CV: RRR w/o M/G/R GI: abd soft, nondistended, mild TTP LLQ, bowel sounds present MSK: no peripheral edema, moves all extremities independently with preserved muscle strength NEURO: No focal neurologic deficit PSYCH: normal mood/affect Integumentary: dry/intact, no rashes or wounds    Data Reviewed: I have personally reviewed following labs and imaging studies  CBC: Recent Labs  Lab 06/01/24 0937 06/02/24 0451  WBC 11.8* 15.9*  NEUTROABS  --  13.3*  HGB 15.7 14.2  HCT 44.5 41.6  MCV 86.9 89.1  PLT 354 291   Basic Metabolic Panel: Recent Labs  Lab 06/01/24 0937  06/02/24 0451  NA 139 133*  K 4.4 3.9  CL 104 98  CO2 22 23  GLUCOSE 102* 112*  BUN 17 11  CREATININE 1.15 1.01  CALCIUM  9.9 9.1   GFR: Estimated Creatinine Clearance: 91.5 mL/min (by C-G formula based on SCr of 1.01 mg/dL). Liver Function Tests: Recent Labs  Lab 06/01/24 0937 06/02/24 0451  AST 26 19  ALT 20 18  ALKPHOS 82 71  BILITOT 0.5 0.8  PROT 8.0 7.0  ALBUMIN 4.6 4.1   Recent Labs  Lab 06/01/24 0937  LIPASE <10*   No results for input(s): AMMONIA in the last 168 hours. Coagulation Profile: No results for input(s): INR, PROTIME in the last 168 hours. Cardiac Enzymes: No results for input(s): CKTOTAL, CKMB, CKMBINDEX, TROPONINI in the last 168 hours. BNP (last 3 results) No results for input(s): PROBNP in the last 8760 hours. HbA1C: No results for input(s): HGBA1C in the last 72 hours. CBG: No results for input(s): GLUCAP in the last 168 hours. Lipid Profile: No results for input(s): CHOL, HDL, LDLCALC, TRIG, CHOLHDL, LDLDIRECT in the last 72 hours. Thyroid  Function Tests: No results for input(s): TSH, T4TOTAL, FREET4, T3FREE, THYROIDAB in the last 72 hours. Anemia Panel: No results for input(s): VITAMINB12, FOLATE, FERRITIN, TIBC, IRON, RETICCTPCT in the last 72 hours. Sepsis Labs: No results for input(s): PROCALCITON, LATICACIDVEN in the  last 168 hours.  No results found for this or any previous visit (from the past 240 hours).       Radiology Studies: CT ABDOMEN PELVIS W CONTRAST Result Date: 06/01/2024 CLINICAL DATA:  Left lower quadrant abdominal pain. History of diverticulitis. EXAM: CT ABDOMEN AND PELVIS WITH CONTRAST TECHNIQUE: Multidetector CT imaging of the abdomen and pelvis was performed using the standard protocol following bolus administration of intravenous contrast. RADIATION DOSE REDUCTION: This exam was performed according to the departmental dose-optimization program which  includes automated exposure control, adjustment of the mA and/or kV according to patient size and/or use of iterative reconstruction technique. CONTRAST:  OMNIPAQUE  IOHEXOL  300 MG/ML  SOLN COMPARISON:  03/08/2024. FINDINGS: Lower chest: No acute abnormality. Hepatobiliary: No focal liver abnormality is seen. No gallstones, gallbladder wall thickening, or biliary dilatation. Pancreas: Unremarkable. No pancreatic ductal dilatation or surrounding inflammatory changes. Spleen: Unremarkable. Adrenals/Urinary Tract: Adrenal glands are unremarkable. Unchanged bilateral nonobstructive renal calculi. No ureteral calculi. No hydronephrosis. Bladder is unremarkable. Stomach/Bowel: Acute diverticulitis involving the mid sigmoid colon with submucosal edema and findings suspicious for a peripherally enhancing intramural abscess measuring approximately 3.0 x 1.9 cm. Similar findings were noted on the prior exam. No appreciable evidence of perforation. Diverticulosis of the sigmoid and descending colon. Stomach is within normal limits. No obstruction. Appendix appears normal. Vascular/Lymphatic: Abdominal aorta is normal in caliber with mild aortoiliac atherosclerotic calcification. Similar appearance of small scattered mesenteric and retroperitoneal lymph nodes, not definitively enlarged by size criteria. Reproductive: Prostate is unremarkable. Other: No abdominopelvic ascites.  No intraperitoneal free air. Musculoskeletal: No acute or significant osseous findings. IMPRESSION: 1. Acute diverticulitis involving the mid sigmoid colon with suspected peripherally enhancing intramural abscess measuring approximately 3.0 x 1.9 cm. Similar findings were noted on the prior exam. No evidence of perforation. 2. Unchanged nonobstructive bilateral renal calculi. No hydronephrosis. Electronically Signed   By: Harrietta Sherry M.D.   On: 06/01/2024 11:42        Scheduled Meds:  acetaminophen   1,000 mg Oral Q6H   Or    acetaminophen   650 mg Rectal Q6H   buPROPion   450 mg Oral Daily   Chlorhexidine Gluconate Cloth  6 each Topical Daily   diltiazem   240 mg Oral Daily   docusate sodium   100 mg Oral BID   pantoprazole   40 mg Oral BID   tamsulosin   0.4 mg Oral Daily   Continuous Infusions:  heparin 1,650 Units/hr (06/02/24 0743)   lactated ringers  125 mL/hr at 06/01/24 2303   piperacillin -tazobactam (ZOSYN )  IV 3.375 g (06/02/24 1132)     LOS: 1 day    Time spent: 51 minutes spent on 06/02/2024 caring for this patient face-to-face including chart review, ordering labs/tests, documenting, discussion with nursing staff, consultants, updating family and interview/physical exam    Camellia PARAS Nahia Nissan, DO [facility] Available via Epic secure chat 7am-7pm After these hours, please refer to coverage provider listed on [email or web address] 06/02/2024, 12:11 PM   "

## 2024-06-02 NOTE — Progress Notes (Addendum)
 "  Progress Note     Interval: WBC rising to 15.9 from 11.8. Has remained afebrile. Patient states his pain is mildly improved from yesterday. He has tolerated clears without nausea but states 30 min to an hour after eating he experiences lower abdominal cramping.   Objective: Vital signs in last 24 hours: Temp:  [97.8 F (36.6 C)-98.7 F (37.1 C)] 98.1 F (36.7 C) (01/02 0509) Pulse Rate:  [93-126] 93 (01/02 0509) Resp:  [15-20] 15 (01/02 0509) BP: (147-191)/(100-131) 156/118 (01/02 0509) SpO2:  [94 %-99 %] 94 % (01/02 0509) Weight:  [87.5 kg] 87.5 kg (01/01 0927) Last BM Date : 06/01/24  Intake/Output from previous day: 01/01 0701 - 01/02 0700 In: 3455.8 [P.O.:720; I.V.:1587.5; IV Piggyback:1148.2] Out: 1550 [Urine:1550] Intake/Output this shift: No intake/output data recorded.  PE: General: no acute distress HEENT: normocephalic, atraumatic Heart: mild sinus tachycardia, hypertensive Lungs: Normal work of breathing on room air Abd: soft, tender to palpation in LLQ and suprapubic region, no rebound or guarding Skin: warm and dry with no masses, lesions, or rashes Neuro: No focal neurologic deficits Psych: A&Ox3 with an appropriate affect.    Lab Results:  Recent Labs    06/01/24 0937 06/02/24 0451  WBC 11.8* 15.9*  HGB 15.7 14.2  HCT 44.5 41.6  PLT 354 291   BMET Recent Labs    06/01/24 0937 06/02/24 0451  NA 139 133*  K 4.4 3.9  CL 104 98  CO2 22 23  GLUCOSE 102* 112*  BUN 17 11  CREATININE 1.15 1.01  CALCIUM  9.9 9.1   PT/INR No results for input(s): LABPROT, INR in the last 72 hours. CMP     Component Value Date/Time   NA 133 (L) 06/02/2024 0451   NA 140 04/05/2018 1119   NA 141 05/24/2017 0845   K 3.9 06/02/2024 0451   K 3.6 05/24/2017 0845   CL 98 06/02/2024 0451   CL 106 05/24/2017 0845   CO2 23 06/02/2024 0451   CO2 26 05/24/2017 0845   GLUCOSE 112 (H) 06/02/2024 0451   GLUCOSE 97 05/24/2017 0845   BUN 11 06/02/2024 0451    BUN 17 04/05/2018 1119   BUN 17 05/24/2017 0845   CREATININE 1.01 06/02/2024 0451   CREATININE 1.11 10/27/2022 1022   CREATININE 1.4 (H) 05/24/2017 0845   CALCIUM  9.1 06/02/2024 0451   CALCIUM  9.0 05/24/2017 0845   PROT 7.0 06/02/2024 0451   PROT 7.8 04/05/2018 1119   PROT 7.5 05/24/2017 0845   ALBUMIN 4.1 06/02/2024 0451   ALBUMIN 4.9 04/05/2018 1119   AST 19 06/02/2024 0451   AST 16 10/27/2022 1022   ALT 18 06/02/2024 0451   ALT 15 10/27/2022 1022   ALT 35 05/24/2017 0845   ALKPHOS 71 06/02/2024 0451   ALKPHOS 66 05/24/2017 0845   BILITOT 0.8 06/02/2024 0451   BILITOT 0.6 10/27/2022 1022   GFRNONAA >60 06/02/2024 0451   GFRNONAA >60 10/27/2022 1022   GFRAA >60 12/08/2019 2321   GFRAA >60 04/11/2018 1020   Lipase     Component Value Date/Time   LIPASE <10 (L) 06/01/2024 0937       Studies/Results: CT ABDOMEN PELVIS W CONTRAST Result Date: 06/01/2024 CLINICAL DATA:  Left lower quadrant abdominal pain. History of diverticulitis. EXAM: CT ABDOMEN AND PELVIS WITH CONTRAST TECHNIQUE: Multidetector CT imaging of the abdomen and pelvis was performed using the standard protocol following bolus administration of intravenous contrast. RADIATION DOSE REDUCTION: This exam was performed according to the departmental dose-optimization  program which includes automated exposure control, adjustment of the mA and/or kV according to patient size and/or use of iterative reconstruction technique. CONTRAST:  OMNIPAQUE  IOHEXOL  300 MG/ML  SOLN COMPARISON:  03/08/2024. FINDINGS: Lower chest: No acute abnormality. Hepatobiliary: No focal liver abnormality is seen. No gallstones, gallbladder wall thickening, or biliary dilatation. Pancreas: Unremarkable. No pancreatic ductal dilatation or surrounding inflammatory changes. Spleen: Unremarkable. Adrenals/Urinary Tract: Adrenal glands are unremarkable. Unchanged bilateral nonobstructive renal calculi. No ureteral calculi. No hydronephrosis. Bladder is  unremarkable. Stomach/Bowel: Acute diverticulitis involving the mid sigmoid colon with submucosal edema and findings suspicious for a peripherally enhancing intramural abscess measuring approximately 3.0 x 1.9 cm. Similar findings were noted on the prior exam. No appreciable evidence of perforation. Diverticulosis of the sigmoid and descending colon. Stomach is within normal limits. No obstruction. Appendix appears normal. Vascular/Lymphatic: Abdominal aorta is normal in caliber with mild aortoiliac atherosclerotic calcification. Similar appearance of small scattered mesenteric and retroperitoneal lymph nodes, not definitively enlarged by size criteria. Reproductive: Prostate is unremarkable. Other: No abdominopelvic ascites.  No intraperitoneal free air. Musculoskeletal: No acute or significant osseous findings. IMPRESSION: 1. Acute diverticulitis involving the mid sigmoid colon with suspected peripherally enhancing intramural abscess measuring approximately 3.0 x 1.9 cm. Similar findings were noted on the prior exam. No evidence of perforation. 2. Unchanged nonobstructive bilateral renal calculi. No hydronephrosis. Electronically Signed   By: Harrietta Sherry M.D.   On: 06/01/2024 11:42     Assessment/Plan 54 y.o. male with recurrent diverticulitis admitted with acute episode with intramural abscess  LOS: 1 day   - Discussed with patient and wife at bedside that ideally can get through this acute episode with IV antibiotics alone and get him to an outpatient setting for elective partial colectomy. However, given still with pain and rising leukocytosis, there is a chance he may need an urgent/emergent colectomy this admission if he fails to improve or clinically worsens. If this were to be the case, he would likely end up with a temporary colostomy that could be reversed in the future. - If patient were to make to outpatient setting, I would plan to discuss with my colorectal partners, due to frequency and  severity of his flares, may be better suited for colorectal surgeon to perform colectomy robotically.  - Would keep on CLD today and not advance past this - Continue IV abx - Okay for heparin gtt, do not restart xarelto   I reviewed hospitalist notes, last 24 h vitals and pain scores, last 48 h intake and output, last 24 h labs and trends, and last 24 h imaging results.  This care required moderate level of medical decision making.    Orie Silversmith, MD General Surgery, Surgical Critical Care and Trauma 06/02/2024, 7:42 AM   Please see amion for on call provider "

## 2024-06-02 NOTE — Plan of Care (Signed)

## 2024-06-02 NOTE — Plan of Care (Signed)
   Problem: Elimination: Goal: Will not experience complications related to bowel motility Outcome: Progressing   Problem: Pain Managment: Goal: General experience of comfort will improve and/or be controlled Outcome: Progressing

## 2024-06-02 NOTE — Progress Notes (Signed)
 PHARMACY - ANTICOAGULATION CONSULT NOTE  Pharmacy Consult for heparin Indication: factor V leiden / DVT   Allergies[1]  Patient Measurements: Height: 5' 9 (175.3 cm) Weight: 87.5 kg (193 lb) IBW/kg (Calculated) : 70.7 HEPARIN DW (KG): 87.5  Vital Signs: Temp: 98.6 F (37 C) (01/02 0953) Temp Source: Oral (01/02 0509) BP: 130/92 (01/02 0953) Pulse Rate: 95 (01/02 0953)  Labs: Recent Labs    06/01/24 0937 06/01/24 2025 06/02/24 0451  HGB 15.7  --  14.2  HCT 44.5  --  41.6  PLT 354  --  291  APTT  --  56* 122*  HEPARINUNFRC  --  0.62 0.98*  CREATININE 1.15  --  1.01    Estimated Creatinine Clearance: 91.5 mL/min (by C-G formula based on SCr of 1.01 mg/dL).   Medications:  Medications Prior to Admission  Medication Sig Dispense Refill Last Dose/Taking   acetaminophen  (TYLENOL ) 500 MG tablet Take 500-1,000 mg by mouth every 6 (six) hours as needed for moderate pain (pain score 4-6).   Unknown   Aspirin-Acetaminophen  (GOODYS BODY PAIN PO) Take 1 packet by mouth See admin instructions. Goody's Cool Orange powder - Pour the contents of one packet on the tongue and dissolve orally twice a day as needed for pain. Follow with water.   Unknown   benzonatate  (TESSALON ) 100 MG capsule Take 100 mg by mouth 3 (three) times daily as needed for cough.   Unknown   buPROPion  (WELLBUTRIN  XL) 150 MG 24 hr tablet Take 450 mg by mouth in the morning.   05/31/2024 Morning   butalbital -acetaminophen -caffeine  (FIORICET ) 50-325-40 MG tablet Take 1 tablet by mouth every 4 (four) hours as needed for headache. 14 tablet 0 Taking As Needed   clindamycin -benzoyl peroxide (BENZACLIN) gel Apply 1 Application topically See admin instructions. Apply in the morning as directed to any acne flares on the face   Unknown   diltiazem  (CARDIZEM  CD) 240 MG 24 hr capsule TAKE 1 CAPSULE BY MOUTH EVERY DAY (Patient taking differently: Take 240 mg by mouth in the morning.) 90 capsule 1 05/31/2024 Morning    fluconazole  (DIFLUCAN ) 200 MG tablet Take 200 mg by mouth every Monday.   05/29/2024   HYDROcodone -acetaminophen  (NORCO/VICODIN) 5-325 MG tablet Take 1 tablet by mouth every 8 (eight) hours as needed (for DIVERTICULITIS PAIN).   Unknown   KLAYESTA  powder Apply 1 Application topically 2 (two) times daily as needed (for rashes).   Unknown   NEXIUM  24HR 20 MG capsule Take 40 mg by mouth daily before breakfast.   05/31/2024 Morning   olmesartan (BENICAR) 40 MG tablet Take 40 mg by mouth daily.   05/31/2024 Morning   ondansetron  (ZOFRAN -ODT) 4 MG disintegrating tablet Take 1 tablet (4 mg total) by mouth every 8 (eight) hours as needed for nausea or vomiting. (Patient taking differently: Take 4 mg by mouth every 8 (eight) hours as needed for nausea or vomiting (DISSOLVE ORALLY).) 20 tablet 0 Unknown   rivaroxaban  (XARELTO ) 20 MG TABS tablet Take 1 tablet (20 mg total) by mouth daily with supper. (Patient taking differently: Take 20 mg by mouth daily with breakfast.) 30 tablet 0 05/31/2024 at  8:00 AM   rosuvastatin  (CRESTOR ) 40 MG tablet Take 1 tablet (40 mg total) by mouth once a week. (Patient taking differently: Take 40 mg by mouth once a week. Friday) 4 tablet 12 05/26/2024   tamsulosin  (FLOMAX ) 0.4 MG CAPS capsule Take 1 capsule (0.4 mg total) by mouth daily. 90 capsule 1 05/31/2024 Morning   tiZANidine  (  ZANAFLEX ) 4 MG tablet Take 4 mg by mouth every 8 (eight) hours as needed for muscle spasms.   Unknown   valACYclovir  (VALTREX ) 500 MG tablet Take 1 tablet (500 mg total) by mouth daily. 30 tablet 12 05/31/2024 Morning   ZYRTEC -D ALLERGY & CONGESTION 5-120 MG tablet Take 1 tablet by mouth every 12 (twelve) hours as needed for allergies or rhinitis.   Unknown   losartan  (COZAAR ) 25 MG tablet Take 2 tablets (50 mg total) by mouth daily. (Patient not taking: Reported on 06/01/2024) 60 tablet 0 Not Taking   Scheduled:   acetaminophen   1,000 mg Oral Q6H   Or   acetaminophen   650 mg Rectal Q6H   buPROPion    450 mg Oral Daily   Chlorhexidine Gluconate Cloth  6 each Topical Daily   diltiazem   240 mg Oral Daily   docusate sodium   100 mg Oral BID   [START ON 06/05/2024] fluconazole   200 mg Oral Weekly   pantoprazole   40 mg Oral BID   rosuvastatin   40 mg Oral Daily   tamsulosin   0.4 mg Oral Daily   valACYclovir   500 mg Oral Daily   Infusions:   heparin 1,650 Units/hr (06/02/24 0743)   lactated ringers      piperacillin -tazobactam (ZOSYN )  IV 3.375 g (06/02/24 1132)    Assessment: On Xarelto  prior to admission for [facility] Leiden with history of multiple DVTs. May have procedure. Pharmacy consulted to dose heparin. Prior anticoagulation: Xarelto  20 mg daily with breakfast; last dose 12/31 AM.  Today, 06/02/2024: aPTT 104, slightly supratherapeutic on heparin 1650 units/hr No bleeding or complications noted.   Goal of Therapy: Heparin level 0.3-0.7 units/ml aPTT 66-102 sec Monitor platelets by anticoagulation protocol: Yes  Plan: Decrease heparin IV infusion to 1550 units/hr Check aPTT 6 hrs after rate change Daily CBC and heparin level; aPTT as needed until DOAC effects dissipate Monitor for signs of bleeding or thrombosis   Wanda Hasting PharmD, BCPS [facility] main pharmacy [phone] 06/02/2024 2:10 PM        [1]  Allergies Allergen Reactions   Bismuth Subsalicylate Anaphylaxis, Swelling and Other (See Comments)    The throat swells   Compazine  [Prochlorperazine  Edisylate] Anxiety and Other (See Comments)    Cervical dystonia and panic attacks, also

## 2024-06-02 NOTE — Progress Notes (Signed)
 PHARMACY - ANTICOAGULATION CONSULT NOTE  Pharmacy Consult for heparin Indication: factor V leiden / DVT   Allergies[1]  Patient Measurements: Height: 5' 9 (175.3 cm) Weight: 87.5 kg (193 lb) IBW/kg (Calculated) : 70.7 HEPARIN DW (KG): 87.5  Vital Signs: Temp: 98.1 F (36.7 C) (01/02 0509) Temp Source: Oral (01/02 0509) BP: 156/118 (01/02 0509) Pulse Rate: 93 (01/02 0509)  Labs: Recent Labs    06/01/24 0937 06/01/24 2025 06/02/24 0451  HGB 15.7  --  14.2  HCT 44.5  --  41.6  PLT 354  --  291  APTT  --  56* 122*  HEPARINUNFRC  --  0.62 0.98*  CREATININE 1.15  --  1.01    Estimated Creatinine Clearance: 91.5 mL/min (by C-G formula based on SCr of 1.01 mg/dL).   Medications:  Medications Prior to Admission  Medication Sig Dispense Refill Last Dose/Taking   acetaminophen  (TYLENOL ) 500 MG tablet Take 500-1,000 mg by mouth every 6 (six) hours as needed for moderate pain (pain score 4-6).   Unknown   Aspirin-Acetaminophen  (GOODYS BODY PAIN PO) Take 1 packet by mouth See admin instructions. Goody's Cool Orange powder - Pour the contents of one packet on the tongue and dissolve orally twice a day as needed for pain. Follow with water.   Unknown   benzonatate  (TESSALON ) 100 MG capsule Take 100 mg by mouth 3 (three) times daily as needed for cough.   Unknown   buPROPion  (WELLBUTRIN  XL) 150 MG 24 hr tablet Take 450 mg by mouth in the morning.   05/31/2024 Morning   butalbital -acetaminophen -caffeine  (FIORICET ) 50-325-40 MG tablet Take 1 tablet by mouth every 4 (four) hours as needed for headache. 14 tablet 0 Taking As Needed   clindamycin -benzoyl peroxide (BENZACLIN) gel Apply 1 Application topically See admin instructions. Apply in the morning as directed to any acne flares on the face   Unknown   diltiazem  (CARDIZEM  CD) 240 MG 24 hr capsule TAKE 1 CAPSULE BY MOUTH EVERY DAY (Patient taking differently: Take 240 mg by mouth in the morning.) 90 capsule 1 05/31/2024 Morning    fluconazole  (DIFLUCAN ) 200 MG tablet Take 200 mg by mouth every Monday.   05/29/2024   HYDROcodone -acetaminophen  (NORCO/VICODIN) 5-325 MG tablet Take 1 tablet by mouth every 8 (eight) hours as needed (for DIVERTICULITIS PAIN).   Unknown   KLAYESTA  powder Apply 1 Application topically 2 (two) times daily as needed (for rashes).   Unknown   NEXIUM  24HR 20 MG capsule Take 40 mg by mouth daily before breakfast.   05/31/2024 Morning   olmesartan (BENICAR) 40 MG tablet Take 40 mg by mouth daily.   05/31/2024 Morning   ondansetron  (ZOFRAN -ODT) 4 MG disintegrating tablet Take 1 tablet (4 mg total) by mouth every 8 (eight) hours as needed for nausea or vomiting. (Patient taking differently: Take 4 mg by mouth every 8 (eight) hours as needed for nausea or vomiting (DISSOLVE ORALLY).) 20 tablet 0 Unknown   rivaroxaban  (XARELTO ) 20 MG TABS tablet Take 1 tablet (20 mg total) by mouth daily with supper. (Patient taking differently: Take 20 mg by mouth daily with breakfast.) 30 tablet 0 05/31/2024 at  8:00 AM   rosuvastatin  (CRESTOR ) 40 MG tablet Take 1 tablet (40 mg total) by mouth once a week. (Patient taking differently: Take 40 mg by mouth once a week. Friday) 4 tablet 12 05/26/2024   tamsulosin  (FLOMAX ) 0.4 MG CAPS capsule Take 1 capsule (0.4 mg total) by mouth daily. 90 capsule 1 05/31/2024 Morning   tiZANidine  (  ZANAFLEX ) 4 MG tablet Take 4 mg by mouth every 8 (eight) hours as needed for muscle spasms.   Unknown   valACYclovir  (VALTREX ) 500 MG tablet Take 1 tablet (500 mg total) by mouth daily. 30 tablet 12 05/31/2024 Morning   ZYRTEC -D ALLERGY & CONGESTION 5-120 MG tablet Take 1 tablet by mouth every 12 (twelve) hours as needed for allergies or rhinitis.   Unknown   losartan  (COZAAR ) 25 MG tablet Take 2 tablets (50 mg total) by mouth daily. 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 May have procedure. Pharmacy consulted to dose heparin.  Baseline INR, aPTT: not done Prior anticoagulation: Xarelto  20 mg daily with breakfast; last dose 12/31 AM  Significant events:  Today, 06/02/2024: CBC: WNL, stable  aPTT supratherapeutic on 1800 units/hr   anti-Xa (heparin) level elevated, but likely falsely elevated d/t recent FXa inhibitor No bleeding or infusion issues per nursing; no pauses noted in infusion since initiation  Goal of Therapy: Heparin level 0.3-0.7 units/ml aPTT 66-102 sec Monitor platelets by anticoagulation protocol: Yes  Plan: Decrease heparin IV infusion to 1650 units/hr Check aPTT 6 hrs after rate change Daily CBC and heparin level; aPTT as needed until DOAC effects dissipate Monitor for signs of bleeding or thrombosis   Dolphus Roller, PharmD, BCPS 06/02/2024 7:39 AM      [1]  Allergies Allergen Reactions   Bismuth Subsalicylate Anaphylaxis, Swelling and Other (See Comments)    The throat swells   Compazine  [Prochlorperazine  Edisylate] Anxiety and Other (See Comments)    Cervical dystonia and panic attacks, also

## 2024-06-03 DIAGNOSIS — K5720 Diverticulitis of large intestine with perforation and abscess without bleeding: Secondary | ICD-10-CM | POA: Diagnosis not present

## 2024-06-03 LAB — BASIC METABOLIC PANEL WITH GFR
Anion gap: 12 (ref 5–15)
BUN: 9 mg/dL (ref 6–20)
CO2: 24 mmol/L (ref 22–32)
Calcium: 9.1 mg/dL (ref 8.9–10.3)
Chloride: 101 mmol/L (ref 98–111)
Creatinine, Ser: 1.11 mg/dL (ref 0.61–1.24)
GFR, Estimated: >60 mL/min
Glucose, Bld: 97 mg/dL (ref 70–99)
Potassium: 3.4 mmol/L — ABNORMAL LOW (ref 3.5–5.1)
Sodium: 137 mmol/L (ref 135–145)

## 2024-06-03 LAB — CBC
HCT: 39.9 % (ref 39.0–52.0)
Hemoglobin: 13.5 g/dL (ref 13.0–17.0)
MCH: 30.5 pg (ref 26.0–34.0)
MCHC: 33.8 g/dL (ref 30.0–36.0)
MCV: 90.1 fL (ref 80.0–100.0)
Platelets: 233 K/uL (ref 150–400)
RBC: 4.43 MIL/uL (ref 4.22–5.81)
RDW: 12.6 % (ref 11.5–15.5)
WBC: 9.9 K/uL (ref 4.0–10.5)
nRBC: 0 % (ref 0.0–0.2)

## 2024-06-03 LAB — APTT
aPTT: 105 s — ABNORMAL HIGH (ref 24–36)
aPTT: 70 s — ABNORMAL HIGH (ref 24–36)

## 2024-06-03 LAB — HEPARIN LEVEL (UNFRACTIONATED)
Heparin Unfractionated: 0.42 [IU]/mL (ref 0.30–0.70)
Heparin Unfractionated: 0.2 [IU]/mL — ABNORMAL LOW (ref 0.30–0.70)
Heparin Unfractionated: 0.41 [IU]/mL (ref 0.30–0.70)

## 2024-06-03 MED ORDER — POTASSIUM CHLORIDE 20 MEQ PO PACK
60.0000 meq | PACK | Freq: Once | ORAL | Status: AC
  Administered 2024-06-03: 60 meq via ORAL
  Filled 2024-06-03: qty 3

## 2024-06-03 MED ORDER — BOOST / RESOURCE BREEZE PO LIQD CUSTOM
1.0000 | Freq: Two times a day (BID) | ORAL | Status: DC
  Administered 2024-06-03 – 2024-06-09 (×10): 1 via ORAL

## 2024-06-03 MED ORDER — HEPARIN BOLUS VIA INFUSION
1300.0000 [IU] | Freq: Once | INTRAVENOUS | Status: AC
  Administered 2024-06-03: 1300 [IU] via INTRAVENOUS
  Filled 2024-06-03: qty 1300

## 2024-06-03 NOTE — Progress Notes (Addendum)
 " PROGRESS NOTE    Benjamin [name]  FMW:[id number] DOB: [birth year] DOA: 06/01/2024 PCP: Gretta Ozell CROME, PA-C    Brief Narrative:   Benjamin [name] is a 54 y.o. 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  Onset night prior.  History of recurrent bouts of diverticulitis.  Patient reports he took some Augmentin  he had at home for few days about 1 week ago that resolved his pain.  But since pain recurred overnight.  Has been seen outpatient by [facility] surgery, Dr. Ebb in June 2025.  In the ED, temperature 98.7 F, HR 126, RR 20, BP 191/128, SpO2 98% on room air.  WBC 11.8, hemoglobin 15.7, plate count 645.  Sodium 139, potassium 4.4, chloride 104, CO2 22, glucose 102, BUN 17, creat 1.15.  AST 26, ALT 20, total bilirubin 0.5.  Lipase less than 10.  Urinalysis unrevealing.  CT abdomen/pelvis with contrast with acute diverticulitis involving the mid sigmoid colon with suspected peripherally enhancing intramural abscess measuring approximately 3.0 x 1.9 cm, no evidence of perforation.  Assessment & Plan:   Acute diverticulitis with intramural abscess, recurrent Patient presenting with recurrent left lower quadrant abdominal pain, onset night prior to ED presentation.  But does report recurrence of pain 1 week ago in which she took Augmentin  with resolution of symptoms.  Patient has afebrile with elevated WBC count of 11.8.  CT abdomen/pelvis with acute diverticulitis mid sigmoid colon with intramural abscess measuring 3.0 x 1.9 cm, no evidence of perforation. -- General Surgery following, appreciate assistance -- WBC 11.8>15.9>9.9 -- Change ceftriaxone /Flagyl  to Zosyn  3.375 g IV q8h given rise in WBC count -- Holding Xarelto  -- Diet advance to full liquid diet today -- Hydrocodone -acetaminophen  5/325 mg 1-2  tablets q4h PRN moderate/severe pain -- Dilaudid  0.5 mg IV every 2 hours as needed severe breakthrough pain not relieved with oral medication -- May need inpatient surgical invention vs outpatient follow-up based on response to antibiotics -- Further per general surgery  Acute urinary retention Patient developed urinary retention while boarding in the ED with bladder scan greater than 300 mL retained.  Initial attempts at In-N-Out catheterization unsuccessful by RN, Foley placed coud catheter by ER MD.  Suspect etiology to his obstructive process related to acute inflammatory changes within his abdomen from acute diverticulitis as above.  CT ab/pelvis with no hydronephrosis. -- Continue Foley catheterization given difficulty on placement with acute infectious process as above; will consider voiding trial Sunday versus Monday -- Tamsulosin  0.4 g p.o. daily -- Oxybutynin  5 mg p.o. every 8 hours as needed bladder spasms  Hypokalemia Potassium 3.4, will replete. -- Repeat electrolytes in a.m.  History of factor V Leiden/recurrent DVT On Xarelto  outpatient. -- Continue to hold Xarelto  potential need of surgical intervention -- Heparin  drip, pharmacy consulted for dosing/monitoring  HTN --Continue Cardizem  240 mg p.o. daily -- hold home olmesatan-medoxomil -- Hydralazine  20 mg p.o. q6h PRN SBP >165  History of headache -- Fioricet  as needed  Depression -- Wellbutrin  450 mg PO daily  GERD -- Protonix  40 mg p.o. twice daily (substituted for home Nexium )   DVT prophylaxis: SCDs Start: 06/01/24 1622    Code Status: Full Code Family Communication: Updated spouse present at bedside this morning  Disposition Plan:  Level of care: Med-Surg Status is: Inpatient Remains inpatient appropriate because: IV antibiotics    Consultants:  General Surgery  Procedures:  None  Antimicrobials:  Ceftriaxone  Metronidazole  Zosyn  1/2>>   Subjective: Patient seen examined bedside, lying  in bed.  Abdominal discomfort improved.  Leukocytosis alsoresolved.  Seen by general surgeon this morning, advance to full liquid diet today.  Foley catheter remains in place, discussed with patient and spouse will remain in place for now and may consider voiding trial either Sunday or Monday.  No other questions or concerns at this time.  Denies current headache, no dizziness, no chest pain, no palpitations, no fever/chills/night sweats, no nausea/vomiting/diarrhea, no focal weakness, no fatigue, no paresthesia.  No acute events overnight per nursing.  Objective: Vitals:   06/02/24 1419 06/02/24 2036 06/03/24 0518 06/03/24 1021  BP: 113/83 (!) 145/101 (!) 139/98 (!) 138/97  Pulse: 96 89 79 79  Resp: 16 16 15 18   Temp: 98.3 F (36.8 C) 98.4 F (36.9 C) 98.2 F (36.8 C) 97.8 F (36.6 C)  TempSrc: Oral Oral Oral   SpO2: 96% 99% 95% 97%  Weight:      Height:        Intake/Output Summary (Last 24 hours) at 06/03/2024 1022 Last data filed at 06/03/2024 0609 Gross per 24 hour  Intake 2639.59 ml  Output 3250 ml  Net -610.41 ml   Filed Weights   06/01/24 0927  Weight: 87.5 kg    Examination:  Physical Exam: GEN: NAD, alert and oriented x 3, wd/wn HEENT: NCAT, PERRL, EOMI, sclera clear, MMM PULM: CTAB w/o wheezes/crackles, normal respiratory effort on room air CV: RRR w/o M/G/R GI: abd soft, nondistended, mild TTP LLQ, bowel sounds present GU: Foley catheter noted with clear yellow urine in collection bag MSK: no peripheral edema, moves all extremities independently with preserved muscle strength NEURO: No focal neurologic deficit PSYCH: normal mood/affect Integumentary: dry/intact, no rashes or wounds    Data Reviewed: I have personally reviewed following labs and imaging studies  CBC: Recent Labs  Lab 06/01/24 0937 06/02/24 0451 06/03/24 0510  WBC 11.8* 15.9* 9.9  NEUTROABS  --  13.3*  --   HGB 15.7 14.2 13.5  HCT 44.5 41.6 39.9  MCV 86.9 89.1 90.1  PLT 354 291 233    Basic Metabolic Panel: Recent Labs  Lab 06/01/24 0937 06/02/24 0451 06/03/24 0510  NA 139 133* 137  K 4.4 3.9 3.4*  CL 104 98 101  CO2 22 23 24   GLUCOSE 102* 112* 97  BUN 17 11 9   CREATININE 1.15 1.01 1.11  CALCIUM  9.9 9.1 9.1   GFR: Estimated Creatinine Clearance: 83.3 mL/min (by C-G formula based on SCr of 1.11 mg/dL). Liver Function Tests: Recent Labs  Lab 06/01/24 0937 06/02/24 0451  AST 26 19  ALT 20 18  ALKPHOS 82 71  BILITOT 0.5 0.8  PROT 8.0 7.0  ALBUMIN 4.6 4.1   Recent Labs  Lab 06/01/24 0937  LIPASE <10*   No results for input(s): AMMONIA in the last 168 hours. Coagulation Profile: No results for input(s): INR, PROTIME in the last 168 hours. Cardiac Enzymes: No results for input(s): CKTOTAL, CKMB, CKMBINDEX, TROPONINI in the last 168 hours. BNP (last 3 results) No results for input(s): PROBNP in the last 8760 hours. HbA1C: No results for input(s): HGBA1C in the last 72 hours. CBG: No results for input(s): GLUCAP in the last 168 hours. Lipid Profile: No results for input(s): CHOL, HDL, LDLCALC, TRIG, CHOLHDL, LDLDIRECT in the last 72 hours. Thyroid  Function Tests: No results for input(s): TSH, T4TOTAL, FREET4, T3FREE, THYROIDAB in the last  72 hours. Anemia Panel: No results for input(s): VITAMINB12, FOLATE, FERRITIN, TIBC, IRON, RETICCTPCT in the last 72 hours. Sepsis Labs: No results for input(s): PROCALCITON, LATICACIDVEN in the last 168 hours.  No results found for this or any previous visit (from the past 240 hours).       Radiology Studies: CT ABDOMEN PELVIS W CONTRAST Result Date: 06/01/2024 CLINICAL DATA:  Left lower quadrant abdominal pain. History of diverticulitis. EXAM: CT ABDOMEN AND PELVIS WITH CONTRAST TECHNIQUE: Multidetector CT imaging of the abdomen and pelvis was performed using the standard protocol following bolus administration of intravenous contrast. RADIATION  DOSE REDUCTION: This exam was performed according to the departmental dose-optimization program which includes automated exposure control, adjustment of the mA and/or kV according to patient size and/or use of iterative reconstruction technique. CONTRAST:  OMNIPAQUE  IOHEXOL  300 MG/ML  SOLN COMPARISON:  03/08/2024. FINDINGS: Lower chest: No acute abnormality. Hepatobiliary: No focal liver abnormality is seen. No gallstones, gallbladder wall thickening, or biliary dilatation. Pancreas: Unremarkable. No pancreatic ductal dilatation or surrounding inflammatory changes. Spleen: Unremarkable. Adrenals/Urinary Tract: Adrenal glands are unremarkable. Unchanged bilateral nonobstructive renal calculi. No ureteral calculi. No hydronephrosis. Bladder is unremarkable. Stomach/Bowel: Acute diverticulitis involving the mid sigmoid colon with submucosal edema and findings suspicious for a peripherally enhancing intramural abscess measuring approximately 3.0 x 1.9 cm. Similar findings were noted on the prior exam. No appreciable evidence of perforation. Diverticulosis of the sigmoid and descending colon. Stomach is within normal limits. No obstruction. Appendix appears normal. Vascular/Lymphatic: Abdominal aorta is normal in caliber with mild aortoiliac atherosclerotic calcification. Similar appearance of small scattered mesenteric and retroperitoneal lymph nodes, not definitively enlarged by size criteria. Reproductive: Prostate is unremarkable. Other: No abdominopelvic ascites.  No intraperitoneal free air. Musculoskeletal: No acute or significant osseous findings. IMPRESSION: 1. Acute diverticulitis involving the mid sigmoid colon with suspected peripherally enhancing intramural abscess measuring approximately 3.0 x 1.9 cm. Similar findings were noted on the prior exam. No evidence of perforation. 2. Unchanged nonobstructive bilateral renal calculi. No hydronephrosis. Electronically Signed   By: Harrietta Sherry M.D.   On:  06/01/2024 11:42        Scheduled Meds:  acetaminophen   1,000 mg Oral Q6H   Or   acetaminophen   650 mg Rectal Q6H   buPROPion   450 mg Oral Daily   Chlorhexidine  Gluconate Cloth  6 each Topical Daily   diltiazem   240 mg Oral Daily   docusate sodium   100 mg Oral BID   feeding supplement  1 Container Oral BID BM   [START ON 06/05/2024] fluconazole   200 mg Oral Weekly   pantoprazole   40 mg Oral BID   rosuvastatin   40 mg Oral Daily   tamsulosin   0.4 mg Oral Daily   valACYclovir   500 mg Oral Daily   Continuous Infusions:  heparin  1,350 Units/hr (06/03/24 0235)   lactated ringers  75 mL/hr at 06/02/24 1925   piperacillin -tazobactam (ZOSYN )  IV 3.375 g (06/03/24 0609)     LOS: 2 days    Time spent: 51 minutes spent on 06/03/2024 caring for this patient face-to-face including chart review, ordering labs/tests, documenting, discussion with nursing staff, consultants, updating family and interview/physical exam    Camellia PARAS Margeret Stachnik, DO [facility] Available via Epic secure chat 7am-7pm After these hours, please refer to coverage provider listed on [email or web address] 06/03/2024, 10:22 AM   "

## 2024-06-03 NOTE — Progress Notes (Addendum)
 PHARMACY - ANTICOAGULATION CONSULT NOTE  Pharmacy Consult for heparin  Indication: factor V leiden / DVT (Xarelto  on hold)  Allergies[1]  Patient Measurements: Height: 5' 9 (175.3 cm) Weight: 87.5 kg (193 lb) IBW/kg (Calculated) : 70.7 HEPARIN  DW (KG): 87.5  Vital Signs: Temp: 98.2 F (36.8 C) (01/03 0518) Temp Source: Oral (01/03 0518) BP: 139/98 (01/03 0518) Pulse Rate: 79 (01/03 0518)  Labs: Recent Labs    06/01/24 9062 06/01/24 9062 06/01/24 2025 06/02/24 0451 06/02/24 1409 06/02/24 2302 06/03/24 0510 06/03/24 0815 06/03/24 0820  HGB 15.7  --   --  14.2  --   --  13.5  --   --   HCT 44.5  --   --  41.6  --   --  39.9  --   --   PLT 354  --   --  291  --   --  233  --   --   APTT  --    < > 56* 122* 104* 105*  --   --  70*  HEPARINUNFRC  --   --  0.62 0.98*  --   --   --  0.42  --   CREATININE 1.15  --   --  1.01  --   --  1.11  --   --    < > = values in this interval not displayed.    Estimated Creatinine Clearance: 83.3 mL/min (by C-G formula based on SCr of 1.11 mg/dL).   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 Pt admitted with diverticulitis with abscess. Xarelto  on hold pending potential surgical intervention. Pharmacy consulted to dose heparin .   Today, 06/03/2024: aPTT = 70 seconds, heparin  level = 0.42 are both therapeutic on heparin  infusion of 1350 units/hr Since levels are correlating, can monitor using heparin  level only going forward CBC: WNL, stable No bleeding or complications reported  Goal of Therapy: Heparin  level 0.3-0.7 units/ml aPTT 66-102 sec Monitor platelets by anticoagulation protocol: Yes  Plan: Continue heparin  infusion at 1350 units/hr Check confirmatory 6 hour heparin  level CBC, heparin  level daily Monitor for signs of bleeding  Ronal CHRISTELLA Rav, PharmD 06/03/2024 9:46 AM  Addendum: Evening Anticoagulation  Follow Up  Assessment: Confirmatory heparin  level = 0.20 is now subtherapeutic on heparin  infusion of 1350 units/hr Confirmed with RN - no interruptions or line issues  Plan: Heparin  bolus of 1300 units IV once Increase rate of heparin  infusion to 1500 units/hr Check 6 hour heparin  level  Ronal CHRISTELLA Rav, PharmD 06/03/2024 3:37 PM    [1]  Allergies Allergen Reactions   Bismuth Subsalicylate Anaphylaxis, Swelling and Other (See Comments)    The throat swells   Compazine  [Prochlorperazine  Edisylate] Anxiety and Other (See Comments)    Cervical dystonia and panic attacks, also

## 2024-06-03 NOTE — Progress Notes (Signed)
 Patient ID: Benjamin [name], male   DOB: 1971/04/29, 54 y.o.   MRN: [id number]   Acute Care Surgery Service Progress Note:    Chief Complaint/Subjective: Wife at bs Feels a lot better But foley is bothering him Lots of flatus No n/v walked  Objective: Vital signs in last 24 hours: Temp:  [98.2 F (36.8 C)-98.6 F (37 C)] 98.2 F (36.8 C) (01/03 0518) Pulse Rate:  [79-96] 79 (01/03 0518) Resp:  [15-18] 15 (01/03 0518) BP: (113-145)/(83-101) 139/98 (01/03 0518) SpO2:  [95 %-99 %] 95 % (01/03 0518) Last BM Date : 06/01/24  Intake/Output from previous day: 01/02 0701 - 01/03 0700 In: 3119.6 [P.O.:1920; I.V.:1106.1; IV Piggyback:93.5] Out: 4700 [Urine:4700] Intake/Output this shift: No intake/output data recorded.  Lungs: , nonlabored  Cardiovascular: reg  Abd: soft, nd, min TTP in lower abd  Extremities: no edema, +SCDs  Neuro: alert, nonfocal  Lab Results: CBC  Recent Labs    06/02/24 0451 06/03/24 0510  WBC 15.9* 9.9  HGB 14.2 13.5  HCT 41.6 39.9  PLT 291 233   BMET Recent Labs    06/02/24 0451 06/03/24 0510  NA 133* 137  K 3.9 3.4*  CL 98 101  CO2 23 24  GLUCOSE 112* 97  BUN 11 9  CREATININE 1.01 1.11  CALCIUM  9.1 9.1   LFT    Latest Ref Rng & Units 06/02/2024    4:51 AM 06/01/2024    9:37 AM 03/08/2024    8:28 PM  Hepatic Function  Total Protein 6.5 - 8.1 g/dL 7.0  8.0  7.2   Albumin 3.5 - 5.0 g/dL 4.1  4.6  4.5   AST 15 - 41 U/L 19  26  25    ALT 0 - 44 U/L 18  20  15    Alk Phosphatase 38 - 126 U/L 71  82  78   Total Bilirubin 0.0 - 1.2 mg/dL 0.8  0.5  0.3    PT/INR No results for input(s): LABPROT, INR in the last 72 hours. ABG No results for input(s): PHART, HCO3 in the last 72 hours.  Invalid input(s): PCO2, PO2  Studies/Results:  Anti-infectives: Anti-infectives (From admission, onward)    Start     Dose/Rate Route Frequency Ordered Stop   06/05/24 1000  fluconazole  (DIFLUCAN ) tablet 200 mg        200 mg Oral  Weekly 06/02/24 1355     06/02/24 1430  valACYclovir  (VALTREX ) tablet 500 mg        500 mg Oral Daily 06/02/24 1355     06/02/24 1000  piperacillin -tazobactam (ZOSYN ) IVPB 3.375 g        3.375 g 12.5 mL/hr over 240 Minutes Intravenous Every 8 hours 06/02/24 0951     06/01/24 1800  piperacillin -tazobactam (ZOSYN ) IVPB 3.375 g  Status:  Discontinued        3.375 g 12.5 mL/hr over 240 Minutes Intravenous Every 8 hours 06/01/24 1153 06/01/24 1621   06/01/24 1800  cefTRIAXone  (ROCEPHIN ) 2 g in sodium chloride  0.9 % 100 mL IVPB  Status:  Discontinued        2 g 200 mL/hr over 30 Minutes Intravenous Every 24 hours 06/01/24 1621 06/02/24 0944   06/01/24 1800  metroNIDAZOLE  (FLAGYL ) tablet 500 mg  Status:  Discontinued        500 mg Oral Every 12 hours 06/01/24 1621 06/02/24 0944   06/01/24 1200  piperacillin -tazobactam (ZOSYN ) IVPB 3.375 g        3.375 g 100 mL/hr  over 30 Minutes Intravenous  Once 06/01/24 1151 06/01/24 1229       Medications: Scheduled Meds:  acetaminophen   1,000 mg Oral Q6H   Or   acetaminophen   650 mg Rectal Q6H   buPROPion   450 mg Oral Daily   Chlorhexidine  Gluconate Cloth  6 each Topical Daily   diltiazem   240 mg Oral Daily   docusate sodium   100 mg Oral BID   feeding supplement  1 Container Oral BID BM   [START ON 06/05/2024] fluconazole   200 mg Oral Weekly   pantoprazole   40 mg Oral BID   rosuvastatin   40 mg Oral Daily   tamsulosin   0.4 mg Oral Daily   valACYclovir   500 mg Oral Daily   Continuous Infusions:  heparin  1,350 Units/hr (06/03/24 0235)   lactated ringers  75 mL/hr at 06/02/24 1925   piperacillin -tazobactam (ZOSYN )  IV 3.375 g (06/03/24 0609)   PRN Meds:.butalbital -acetaminophen -caffeine , hydrALAZINE , HYDROcodone -acetaminophen , HYDROmorphone  (DILAUDID ) injection, labetalol , ondansetron  **OR** ondansetron  (ZOFRAN ) IV, oxybutynin   Assessment/Plan: Patient Active Problem List   Diagnosis Date Noted   Acute urinary retention 06/01/2024   History of  DVT (deep vein thrombosis) 03/09/2024   History of factor V Leiden mutation 03/09/2024   Migraine without status migrainosus, not intractable 03/09/2024   Diverticulitis of large intestine with abscess 09/21/2023   Palpitations 06/11/2020   Chronic migraine without aura, intractable, with status migrainosus 04/09/2017   New daily persistent headache 03/29/2017   Urinary frequency 09/02/2015   History of diverticulitis 08/09/2015   BMI 26.0-26.9,adult 09/17/2014   Adjustment disorder with mixed anxiety and depressed mood 02/19/2014   Chronic cluster headache, not intractable 02/19/2014   BURSITIS, HIP 09/30/2009   Backache 10/17/2008   Allergic rhinitis 07/11/2008   Benign prostatic hyperplasia 07/21/2007   Essential hypertension 01/05/2007   PSORIASIS 01/05/2007   54 y.o. male with recurrent diverticulitis admitted with acute episode with intramural abscess  -Afebrile, wbc better, looks more comfortable -Adv to FLD, add protein shakes   - Discussed with patient and wife at bedside that ideally can get through this acute episode with IV antibiotics alone and get him to an outpatient setting for elective partial colectomy. Dr Ann is recommending him see one of our colorectal surgeons instead of her now. . Long discussion with pt and wife 1/3 about ideal plan for short interval partial colectomy if he continues to improve. Also discussed indications for proceeding with surgery this admission   - Would keep on CLD today and not advance past this - Continue IV abx Factor V - Okay for heparin  gtt, do not restart xarelto  Urinary retention -per TRH   I reviewed hospitalist notes, last 24 h vitals and pain scores, last 48 h intake and output, last 24 h labs and trends, and last 24 h imaging results.   This care required moderate level of medical decision making.  Disposition:  LOS: 2 days    Benjamin HERO. Tanda, MD, FACS General, Bariatric, & Minimally Invasive Surgery [phone] [facility]

## 2024-06-03 NOTE — Progress Notes (Signed)
 PHARMACY - ANTICOAGULATION CONSULT NOTE  Pharmacy Consult for heparin  Indication: factor V leiden / DVT (Xarelto  on hold)  Allergies[1]  Patient Measurements: Height: 5' 9 (175.3 cm) Weight: 87.5 kg (193 lb) IBW/kg (Calculated) : 70.7 HEPARIN  DW (KG): 87.5  Vital Signs: Temp: 98.7 F (37.1 C) (01/03 2008) Temp Source: Oral (01/03 2008) BP: 143/104 (01/03 2008) Pulse Rate: 88 (01/03 2008)  Labs: Recent Labs    06/01/24 9062 06/01/24 2025 06/02/24 0451 06/02/24 1409 06/02/24 2302 06/03/24 0510 06/03/24 0815 06/03/24 0820 06/03/24 1450 06/03/24 2039  HGB 15.7  --  14.2  --   --  13.5  --   --   --   --   HCT 44.5  --  41.6  --   --  39.9  --   --   --   --   PLT 354  --  291  --   --  233  --   --   --   --   APTT  --    < > 122* 104* 105*  --   --  70*  --   --   HEPARINUNFRC  --    < > 0.98*  --   --   --  0.42  --  0.20* 0.41  CREATININE 1.15  --  1.01  --   --  1.11  --   --   --   --    < > = values in this interval not displayed.    Estimated Creatinine Clearance: 83.3 mL/min (by C-G formula based on SCr of 1.11 mg/dL).   Medications: PTA Xarelto  -Last dose: 12/31 AM  Assessment: Pt is a 17 yoM who takes Xarelto  PTA for history of  FV Leiden with history of multiple DVTs. Pt admitted with diverticulitis with abscess. Xarelto  on hold pending potential surgical intervention. Pharmacy consulted to dose heparin .   Today, 06/03/2024: 2039 Heparin  level = 0.41; therapeutic on heparin  infusion of 1500 units/hr.  Note level drawn only ~4h after rate increase so may not reflect true steady state No bleeding or complications reported by RN  Goal of Therapy: Heparin  level 0.3-0.7 units/ml Monitor platelets by anticoagulation protocol: Yes  Plan: Continue heparin  infusion at 1500 units/hr Check confirmatory heparin  level with morning labs CBC, heparin  level daily Monitor for signs of bleeding  Rosaline Millet, PharmD 06/03/2024 @ 10:21 PM      [1]   Allergies Allergen Reactions   Bismuth Subsalicylate Anaphylaxis, Swelling and Other (See Comments)    The throat swells   Compazine  [Prochlorperazine  Edisylate] Anxiety and Other (See Comments)    Cervical dystonia and panic attacks, also

## 2024-06-03 NOTE — Plan of Care (Signed)
   Problem: Clinical Measurements: Goal: Diagnostic test results will improve Outcome: Progressing

## 2024-06-03 NOTE — Progress Notes (Signed)
 PHARMACY - ANTICOAGULATION CONSULT NOTE  Pharmacy Consult for heparin  Indication: factor V leiden / DVT   Allergies[1]  Patient Measurements: Height: 5' 9 (175.3 cm) Weight: 87.5 kg (193 lb) IBW/kg (Calculated) : 70.7 HEPARIN  DW (KG): 87.5  Vital Signs: Temp: 98.4 F (36.9 C) (01/02 2036) Temp Source: Oral (01/02 2036) BP: 145/101 (01/02 2036) Pulse Rate: 89 (01/02 2036)  Labs: Recent Labs    06/01/24 0937 06/01/24 2025 06/01/24 2025 06/02/24 0451 06/02/24 1409 06/02/24 2302  HGB 15.7  --   --  14.2  --   --   HCT 44.5  --   --  41.6  --   --   PLT 354  --   --  291  --   --   APTT  --  56*   < > 122* 104* 105*  HEPARINUNFRC  --  0.62  --  0.98*  --   --   CREATININE 1.15  --   --  1.01  --   --    < > = values in this interval not displayed.    Estimated Creatinine Clearance: 91.5 mL/min (by C-G formula based on SCr of 1.01 mg/dL).   Medications:  Medications Prior to Admission  Medication Sig Dispense Refill Last Dose/Taking   acetaminophen  (TYLENOL ) 500 MG tablet Take 500-1,000 mg by mouth every 6 (six) hours as needed for moderate pain (pain score 4-6).   Unknown   Aspirin-Acetaminophen  (GOODYS BODY PAIN PO) Take 1 packet by mouth See admin instructions. Goody's Cool Orange powder - Pour the contents of one packet on the tongue and dissolve orally twice a day as needed for pain. Follow with water .   Unknown   benzonatate  (TESSALON ) 100 MG capsule Take 100 mg by mouth 3 (three) times daily as needed for cough.   Unknown   buPROPion  (WELLBUTRIN  XL) 150 MG 24 hr tablet Take 450 mg by mouth in the morning.   05/31/2024 Morning   butalbital -acetaminophen -caffeine  (FIORICET ) 50-325-40 MG tablet Take 1 tablet by mouth every 4 (four) hours as needed for headache. 14 tablet 0 Taking As Needed   clindamycin -benzoyl peroxide (BENZACLIN) gel Apply 1 Application topically See admin instructions. Apply in the morning as directed to any acne flares on the face   Unknown    diltiazem  (CARDIZEM  CD) 240 MG 24 hr capsule TAKE 1 CAPSULE BY MOUTH EVERY DAY (Patient taking differently: Take 240 mg by mouth in the morning.) 90 capsule 1 05/31/2024 Morning   fluconazole  (DIFLUCAN ) 200 MG tablet Take 200 mg by mouth every Monday.   05/29/2024   HYDROcodone -acetaminophen  (NORCO/VICODIN) 5-325 MG tablet Take 1 tablet by mouth every 8 (eight) hours as needed (for DIVERTICULITIS PAIN).   Unknown   KLAYESTA  powder Apply 1 Application topically 2 (two) times daily as needed (for rashes).   Unknown   NEXIUM  24HR 20 MG capsule Take 40 mg by mouth daily before breakfast.   05/31/2024 Morning   olmesartan (BENICAR) 40 MG tablet Take 40 mg by mouth daily.   05/31/2024 Morning   ondansetron  (ZOFRAN -ODT) 4 MG disintegrating tablet Take 1 tablet (4 mg total) by mouth every 8 (eight) hours as needed for nausea or vomiting. (Patient taking differently: Take 4 mg by mouth every 8 (eight) hours as needed for nausea or vomiting (DISSOLVE ORALLY).) 20 tablet 0 Unknown   rivaroxaban  (XARELTO ) 20 MG TABS tablet Take 1 tablet (20 mg total) by mouth daily with supper. (Patient taking differently: Take 20 mg by mouth daily with  breakfast.) 30 tablet 0 05/31/2024 at  8:00 AM   rosuvastatin  (CRESTOR ) 40 MG tablet Take 1 tablet (40 mg total) by mouth once a week. (Patient taking differently: Take 40 mg by mouth once a week. Friday) 4 tablet 12 05/26/2024   tamsulosin  (FLOMAX ) 0.4 MG CAPS capsule Take 1 capsule (0.4 mg total) by mouth daily. 90 capsule 1 05/31/2024 Morning   tiZANidine  (ZANAFLEX ) 4 MG tablet Take 4 mg by mouth every 8 (eight) hours as needed for muscle spasms.   Unknown   valACYclovir  (VALTREX ) 500 MG tablet Take 1 tablet (500 mg total) by mouth daily. 30 tablet 12 05/31/2024 Morning   ZYRTEC -D ALLERGY & CONGESTION 5-120 MG tablet Take 1 tablet by mouth every 12 (twelve) hours as needed for allergies or rhinitis.   Unknown   losartan  (COZAAR ) 25 MG tablet Take 2 tablets (50 mg total) by mouth  daily. (Patient not taking: Reported on 06/01/2024) 60 tablet 0 Not Taking   Scheduled:   acetaminophen   1,000 mg Oral Q6H   Or   acetaminophen   650 mg Rectal Q6H   buPROPion   450 mg Oral Daily   Chlorhexidine  Gluconate Cloth  6 each Topical Daily   diltiazem   240 mg Oral Daily   docusate sodium   100 mg Oral BID   [START ON 06/05/2024] fluconazole   200 mg Oral Weekly   pantoprazole   40 mg Oral BID   rosuvastatin   40 mg Oral Daily   tamsulosin   0.4 mg Oral Daily   valACYclovir   500 mg Oral Daily   Infusions:   heparin  1,550 Units/hr (06/02/24 1925)   lactated ringers  75 mL/hr at 06/02/24 1925   piperacillin -tazobactam (ZOSYN )  IV 3.375 g (06/02/24 2114)    Assessment: On Xarelto  prior to admission for FV Leiden with history of multiple DVTs. May have procedure. Pharmacy consulted to dose heparin . Prior anticoagulation: Xarelto  20 mg daily with breakfast; last dose 12/31 AM.  Today, 06/03/2024: aPTT 105, remains slightly supratherapeutic despite rate decrease to  heparin  1550 units/hr No bleeding or complications noted by RN   Goal of Therapy: Heparin  level 0.3-0.7 units/ml aPTT 66-102 sec Monitor platelets by anticoagulation protocol: Yes  Plan: Decrease heparin  IV infusion to 1350 units/hr Check aPTT 6 hrs after rate change Daily CBC and heparin  level; aPTT as needed until DOAC effects dissipate Monitor for signs of bleeding or thrombosis  Rosaline Millet, PharmD, BCPS 06/03/2024 1:35 AM         [1]  Allergies Allergen Reactions   Bismuth Subsalicylate Anaphylaxis, Swelling and Other (See Comments)    The throat swells   Compazine  [Prochlorperazine  Edisylate] Anxiety and Other (See Comments)    Cervical dystonia and panic attacks, also

## 2024-06-04 DIAGNOSIS — K5720 Diverticulitis of large intestine with perforation and abscess without bleeding: Secondary | ICD-10-CM | POA: Diagnosis not present

## 2024-06-04 LAB — BASIC METABOLIC PANEL WITH GFR
Anion gap: 15 (ref 5–15)
BUN: 8 mg/dL (ref 6–20)
CO2: 20 mmol/L — ABNORMAL LOW (ref 22–32)
Calcium: 9.3 mg/dL (ref 8.9–10.3)
Chloride: 101 mmol/L (ref 98–111)
Creatinine, Ser: 1.04 mg/dL (ref 0.61–1.24)
GFR, Estimated: >60 mL/min
Glucose, Bld: 102 mg/dL — ABNORMAL HIGH (ref 70–99)
Potassium: 3.6 mmol/L (ref 3.5–5.1)
Sodium: 135 mmol/L (ref 135–145)

## 2024-06-04 LAB — CBC
HCT: 41.8 % (ref 39.0–52.0)
Hemoglobin: 14.2 g/dL (ref 13.0–17.0)
MCH: 30.6 pg (ref 26.0–34.0)
MCHC: 34 g/dL (ref 30.0–36.0)
MCV: 90.1 fL (ref 80.0–100.0)
Platelets: 248 K/uL (ref 150–400)
RBC: 4.64 MIL/uL (ref 4.22–5.81)
RDW: 12.6 % (ref 11.5–15.5)
WBC: 10.3 K/uL (ref 4.0–10.5)
nRBC: 0 % (ref 0.0–0.2)

## 2024-06-04 LAB — HEPARIN LEVEL (UNFRACTIONATED)
Heparin Unfractionated: 0.3 [IU]/mL (ref 0.30–0.70)
Heparin Unfractionated: 0.33 [IU]/mL (ref 0.30–0.70)
Heparin Unfractionated: 0.35 [IU]/mL (ref 0.30–0.70)

## 2024-06-04 LAB — MAGNESIUM: Magnesium: 2.5 mg/dL — ABNORMAL HIGH (ref 1.7–2.4)

## 2024-06-04 MED ORDER — POLYETHYLENE GLYCOL 3350 17 G PO PACK
17.0000 g | PACK | Freq: Every day | ORAL | Status: DC
  Administered 2024-06-04 – 2024-06-07 (×4): 17 g via ORAL
  Filled 2024-06-04 (×4): qty 1

## 2024-06-04 NOTE — Progress Notes (Signed)
 " PROGRESS NOTE    Benjamin [name]  FMW:[id number] DOB: 07/31/1970 DOA: 06/01/2024 PCP: Gretta Ozell CROME, PA-C    Brief Narrative:   Benjamin [name] is a 54 y.o. 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  Onset night prior.  History of recurrent bouts of diverticulitis.  Patient reports he took some Augmentin  he had at home for few days about 1 week ago that resolved his pain.  But since pain recurred overnight.  Has been seen outpatient by [facility] surgery, Dr. Ebb in June 2025.  In the ED, temperature 98.7 F, HR 126, RR 20, BP 191/128, SpO2 98% on room air.  WBC 11.8, hemoglobin 15.7, plate count 645.  Sodium 139, potassium 4.4, chloride 104, CO2 22, glucose 102, BUN 17, creat 1.15.  AST 26, ALT 20, total bilirubin 0.5.  Lipase less than 10.  Urinalysis unrevealing.  CT abdomen/pelvis with contrast with acute diverticulitis involving the mid sigmoid colon with suspected peripherally enhancing intramural abscess measuring approximately 3.0 x 1.9 cm, no evidence of perforation.  Assessment & Plan:   Acute diverticulitis with intramural abscess, recurrent Patient presenting with recurrent left lower quadrant abdominal pain, onset night prior to ED presentation.  But does report recurrence of pain 1 week ago in which she took Augmentin  with resolution of symptoms.  Patient has afebrile with elevated WBC count of 11.8.  CT abdomen/pelvis with acute diverticulitis mid sigmoid colon with intramural abscess measuring 3.0 x 1.9 cm, no evidence of perforation. -- General Surgery following, appreciate assistance -- WBC 11.8>15.9>9.9>10.3 -- Change ceftriaxone /Flagyl  to Zosyn  3.375 g IV q8h given rise in WBC count -- Holding Xarelto  -- Diet advance to soft diet today -- Hydrocodone -acetaminophen  5/325 mg 1-2  tablets q4h PRN moderate/severe pain -- Dilaudid  0.5 mg IV every 2 hours as needed severe breakthrough pain not relieved with oral medication -- Timing per surgical intervention per general surgery -- Further per general surgery  Acute urinary retention Patient developed urinary retention while boarding in the ED with bladder scan greater than 300 mL retained.  Initial attempts at In-N-Out catheterization unsuccessful by RN, Foley placed coud catheter by ER MD.  Suspect etiology to his obstructive process related to acute inflammatory changes within his abdomen from acute diverticulitis as above.  CT ab/pelvis with no hydronephrosis. -- Tamsulosin  0.4 g p.o. daily -- Oxybutynin  5 mg p.o. every 8 hours as needed bladder spasms -- Continue Foley catheter, will attempt voiding trial tomorrow  Hypokalemia Repleted.  Potassium 3.6 this morning  History of factor V Leiden/recurrent DVT On Xarelto  outpatient. -- Continue to hold Xarelto  per general surgery -- Heparin  drip, pharmacy consulted for dosing/monitoring  HTN --Continue Cardizem  240 mg p.o. daily -- hold home olmesatan-medoxomil -- Hydralazine  20 mg p.o. q6h PRN SBP >165  History of headache -- Fioricet  as needed  Depression -- Wellbutrin  450 mg PO daily  GERD -- Protonix  40 mg p.o. twice daily (substituted for home Nexium )   DVT prophylaxis: SCDs Start: 06/01/24 1622    Code Status: Full Code Family Communication: Updated spouse and mother present at bedside this morning  Disposition Plan:  Level of care: Med-Surg Status is: Inpatient Remains inpatient appropriate because: IV antibiotics    Consultants:  General Surgery  Procedures:  None  Antimicrobials:  Ceftriaxone  Metronidazole  Zosyn  1/2>>   Subjective: Patient seen examined bedside, lying in  bed.  Reports improved abdominal discomfort.  Does have some irritation with the Foley catheter, discussed we will attempt voiding trial tomorrow.  Diet advanced  to soft today by general surgery.  Timing of surgical invention still up in the air per general surgery. No other questions or concerns at this time.  Denies current headache, no dizziness, no chest pain, no palpitations, no fever/chills/night sweats, no nausea/vomiting/diarrhea, no focal weakness, no fatigue, no paresthesia.  No acute events overnight per nursing.  Objective: Vitals:   06/03/24 1021 06/03/24 1330 06/03/24 2008 06/04/24 0502  BP: (!) 138/97 134/87 (!) 143/104 (!) 138/100  Pulse: 79 88 88 90  Resp: 18 16 15 14   Temp: 97.8 F (36.6 C) 98.3 F (36.8 C) 98.7 F (37.1 C) 98.5 F (36.9 C)  TempSrc:   Oral Oral  SpO2: 97% 98% 97% 96%  Weight:      Height:        Intake/Output Summary (Last 24 hours) at 06/04/2024 1052 Last data filed at 06/04/2024 1000 Gross per 24 hour  Intake 1679.68 ml  Output 3700 ml  Net -2020.32 ml   Filed Weights   06/01/24 0927  Weight: 87.5 kg    Examination:  Physical Exam: GEN: NAD, alert and oriented x 3, wd/wn HEENT: NCAT, PERRL, EOMI, sclera clear, MMM PULM: CTAB w/o wheezes/crackles, normal respiratory effort on room air CV: RRR w/o M/G/R GI: abd soft, nondistended, minimal TTP LLQ, bowel sounds present GU: Foley catheter noted with clear yellow urine in collection bag MSK: no peripheral edema, moves all extremities independently with preserved muscle strength NEURO: No focal neurologic deficit PSYCH: normal mood/affect Integumentary: dry/intact, no rashes or wounds    Data Reviewed: I have personally reviewed following labs and imaging studies  CBC: Recent Labs  Lab 06/01/24 0937 06/02/24 0451 06/03/24 0510 06/04/24 0410  WBC 11.8* 15.9* 9.9 10.3  NEUTROABS  --  13.3*  --   --   HGB 15.7 14.2 13.5 14.2  HCT 44.5 41.6 39.9 41.8  MCV 86.9 89.1 90.1 90.1  PLT 354 291 233 248   Basic Metabolic Panel: Recent Labs  Lab 06/01/24 0937 06/02/24 0451 06/03/24 0510 06/04/24 0410  NA 139 133* 137 135  K 4.4 3.9 3.4* 3.6   CL 104 98 101 101  CO2 22 23 24  20*  GLUCOSE 102* 112* 97 102*  BUN 17 11 9 8   CREATININE 1.15 1.01 1.11 1.04  CALCIUM  9.9 9.1 9.1 9.3  MG  --   --   --  2.5*   GFR: Estimated Creatinine Clearance: 88.9 mL/min (by C-G formula based on SCr of 1.04 mg/dL). Liver Function Tests: Recent Labs  Lab 06/01/24 0937 06/02/24 0451  AST 26 19  ALT 20 18  ALKPHOS 82 71  BILITOT 0.5 0.8  PROT 8.0 7.0  ALBUMIN 4.6 4.1   Recent Labs  Lab 06/01/24 0937  LIPASE <10*   No results for input(s): AMMONIA in the last 168 hours. Coagulation Profile: No results for input(s): INR, PROTIME in the last 168 hours. Cardiac Enzymes: No results for input(s): CKTOTAL, CKMB, CKMBINDEX, TROPONINI in the last 168 hours. BNP (last 3 results) No results for input(s): PROBNP in the last 8760 hours. HbA1C: No results for input(s): HGBA1C in the last 72 hours. CBG: No results for input(s): GLUCAP in the last 168 hours. Lipid Profile: No results for input(s): CHOL, HDL, LDLCALC, TRIG, CHOLHDL, LDLDIRECT in the last 72 hours. Thyroid  Function Tests: No results for input(s): TSH, T4TOTAL,  FREET4, T3FREE, THYROIDAB in the last 72 hours. Anemia Panel: No results for input(s): VITAMINB12, FOLATE, FERRITIN, TIBC, IRON, RETICCTPCT in the last 72 hours. Sepsis Labs: No results for input(s): PROCALCITON, LATICACIDVEN in the last 168 hours.  No results found for this or any previous visit (from the past 240 hours).       Radiology Studies: No results found.       Scheduled Meds:  acetaminophen   1,000 mg Oral Q6H   Or   acetaminophen   650 mg Rectal Q6H   buPROPion   450 mg Oral Daily   Chlorhexidine  Gluconate Cloth  6 each Topical Daily   diltiazem   240 mg Oral Daily   docusate sodium   100 mg Oral BID   feeding supplement  1 Container Oral BID BM   [START ON 06/05/2024] fluconazole   200 mg Oral Weekly   pantoprazole   40 mg Oral BID    polyethylene glycol  17 g Oral Daily   rosuvastatin   40 mg Oral Daily   tamsulosin   0.4 mg Oral Daily   valACYclovir   500 mg Oral Daily   Continuous Infusions:  heparin  1,600 Units/hr (06/04/24 0549)   piperacillin -tazobactam (ZOSYN )  IV 3.375 g (06/04/24 0546)     LOS: 3 days    Time spent: 51 minutes spent on 06/04/2024 caring for this patient face-to-face including chart review, ordering labs/tests, documenting, discussion with nursing staff, consultants, updating family and interview/physical exam    Camellia PARAS Keinan Brouillet, DO [facility] Available via Epic secure chat 7am-7pm After these hours, please refer to coverage provider listed on [email or web address] 06/04/2024, 10:52 AM   "

## 2024-06-04 NOTE — Progress Notes (Signed)
 PHARMACY - ANTICOAGULATION CONSULT NOTE  Pharmacy Consult for heparin  Indication: factor V leiden / DVT (Xarelto  on hold)  Allergies[1]  Patient Measurements: Height: 5' 9 (175.3 cm) Weight: 87.5 kg (193 lb) IBW/kg (Calculated) : 70.7 HEPARIN  DW (KG): 87.5  Vital Signs: Temp: 98.7 F (37.1 C) (01/03 2008) Temp Source: Oral (01/03 2008) BP: 143/104 (01/03 2008) Pulse Rate: 88 (01/03 2008)  Labs: Recent Labs    06/01/24 9062 06/01/24 2025 06/02/24 0451 06/02/24 1409 06/02/24 2302 06/03/24 0510 06/03/24 0815 06/03/24 0820 06/03/24 1450 06/03/24 2039 06/04/24 0410  HGB 15.7  --  14.2  --   --  13.5  --   --   --   --  14.2  HCT 44.5  --  41.6  --   --  39.9  --   --   --   --  41.8  PLT 354  --  291  --   --  233  --   --   --   --  248  APTT  --    < > 122* 104* 105*  --   --  70*  --   --   --   HEPARINUNFRC  --    < > 0.98*  --   --   --    < >  --  0.20* 0.41 0.30  CREATININE 1.15  --  1.01  --   --  1.11  --   --   --   --   --    < > = values in this interval not displayed.    Estimated Creatinine Clearance: 83.3 mL/min (by C-G formula based on SCr of 1.11 mg/dL).   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 Pt admitted with diverticulitis with abscess. Xarelto  on hold pending potential surgical intervention. Pharmacy consulted to dose heparin .   Today, 06/04/2024: Heparin  level = 0.3; therapeutic but trending down on heparin  infusion of 1500 units/hr.   CBC: Hg, pltc WNL No bleeding or complications reported by RN  Goal of Therapy: Heparin  level 0.3-0.7 units/ml Monitor platelets by anticoagulation protocol: Yes  Plan: Increase heparin  infusion to 1600 units/hr Recheck heparin  level in 8h to ensure remains therapeutic CBC, heparin  level daily Monitor for signs of bleeding  Rosaline Millet, PharmD 06/04/2024 @ 4:49 AM       [1]   Allergies Allergen Reactions   Bismuth Subsalicylate Anaphylaxis, Swelling and Other (See Comments)    The throat swells   Compazine  [Prochlorperazine  Edisylate] Anxiety and Other (See Comments)    Cervical dystonia and panic attacks, also

## 2024-06-04 NOTE — Progress Notes (Signed)
 PHARMACY - ANTICOAGULATION CONSULT NOTE  Pharmacy Consult for heparin  Indication: factor V leiden / DVT (Xarelto  on hold)  Allergies[1]  Patient Measurements: Height: 5' 9 (175.3 cm) Weight: 87.5 kg (193 lb) IBW/kg (Calculated) : 70.7 HEPARIN  DW (KG): 87.5  Vital Signs: Temp: 98.5 F (36.9 C) (01/04 0502) Temp Source: Oral (01/04 0502) BP: 138/100 (01/04 0502) Pulse Rate: 90 (01/04 0502)  Labs: Recent Labs    06/02/24 0451 06/02/24 1409 06/02/24 2302 06/03/24 0510 06/03/24 0815 06/03/24 0820 06/03/24 1450 06/03/24 2039 06/04/24 0410  HGB 14.2  --   --  13.5  --   --   --   --  14.2  HCT 41.6  --   --  39.9  --   --   --   --  41.8  PLT 291  --   --  233  --   --   --   --  248  APTT 122* 104* 105*  --   --  70*  --   --   --   HEPARINUNFRC 0.98*  --   --   --    < >  --  0.20* 0.41 0.30  CREATININE 1.01  --   --  1.11  --   --   --   --  1.04   < > = values in this interval not displayed.    Estimated Creatinine Clearance: 88.9 mL/min (by C-G formula based on SCr of 1.04 mg/dL).   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 Pt admitted with diverticulitis with abscess. Xarelto  on hold pending potential surgical intervention. Pharmacy consulted to dose heparin .  Today, 06/04/2024: Heparin  level = 0.33 is therapeutic on heparin  infusion of 1600 units/hr  CBC: WNL, stable CCS notes some bleeding around Foley. No other complications reported  Goal of Therapy: Heparin  level 0.3-0.7 units/ml Monitor platelets by anticoagulation protocol: Yes  Plan: Continue heparin  infusion of 1600 units/hr Check confirmatory 6 hour heparin  level CBC, heparin  level daily Monitor for signs of bleeding  If patient will require surgical intervention, will need orders for when to hold heparin  peri-operatively.   Ronal CHRISTELLA Rav, PharmD 06/04/2024 10:32 AM    [1]   Allergies Allergen Reactions   Bismuth Subsalicylate Anaphylaxis, Swelling and Other (See Comments)    The throat swells   Compazine  [Prochlorperazine  Edisylate] Anxiety and Other (See Comments)    Cervical dystonia and panic attacks, also

## 2024-06-04 NOTE — Plan of Care (Signed)
   Problem: Clinical Measurements: Goal: Will remain free from infection Outcome: Progressing Goal: Diagnostic test results will improve Outcome: Progressing

## 2024-06-04 NOTE — Progress Notes (Signed)
 Patient ID: Benjamin [name], male   DOB: 09-16-1970, 54 y.o.   MRN: [id number]   Acute Care Surgery Service Progress Note:    Chief Complaint/Subjective: mom at bs Feels a lot better But foley is still bothering him-some bleeding around it Lots of flatus No n/v Walked Small bm this am  Objective: Vital signs in last 24 hours: Temp:  [97.8 F (36.6 C)-98.7 F (37.1 C)] 98.5 F (36.9 C) (01/04 0502) Pulse Rate:  [79-90] 90 (01/04 0502) Resp:  [14-18] 14 (01/04 0502) BP: (134-143)/(87-104) 138/100 (01/04 0502) SpO2:  [96 %-98 %] 96 % (01/04 0502) Last BM Date : 06/04/24  Intake/Output from previous day: 01/03 0701 - 01/04 0700 In: 2039.7 [P.O.:1100; I.V.:777.9; IV Piggyback:161.8] Out: 3300 [Urine:3300] Intake/Output this shift: No intake/output data recorded.  Lungs: , nonlabored  Cardiovascular: reg  Abd: soft, nd, min to nontender in lower abd  Extremities: no edema, +SCDs  Neuro: alert, nonfocal  Lab Results: CBC  Recent Labs    06/03/24 0510 06/04/24 0410  WBC 9.9 10.3  HGB 13.5 14.2  HCT 39.9 41.8  PLT 233 248   BMET Recent Labs    06/03/24 0510 06/04/24 0410  NA 137 135  K 3.4* 3.6  CL 101 101  CO2 24 20*  GLUCOSE 97 102*  BUN 9 8  CREATININE 1.11 1.04  CALCIUM  9.1 9.3   LFT    Latest Ref Rng & Units 06/02/2024    4:51 AM 06/01/2024    9:37 AM 03/08/2024    8:28 PM  Hepatic Function  Total Protein 6.5 - 8.1 g/dL 7.0  8.0  7.2   Albumin 3.5 - 5.0 g/dL 4.1  4.6  4.5   AST 15 - 41 U/L 19  26  25    ALT 0 - 44 U/L 18  20  15    Alk Phosphatase 38 - 126 U/L 71  82  78   Total Bilirubin 0.0 - 1.2 mg/dL 0.8  0.5  0.3    PT/INR No results for input(s): LABPROT, INR in the last 72 hours. ABG No results for input(s): PHART, HCO3 in the last 72 hours.  Invalid input(s): PCO2, PO2  Studies/Results:  Anti-infectives: Anti-infectives (From admission, onward)    Start     Dose/Rate Route Frequency Ordered Stop   06/05/24 1000   fluconazole  (DIFLUCAN ) tablet 200 mg        200 mg Oral Weekly 06/02/24 1355     06/02/24 1430  valACYclovir  (VALTREX ) tablet 500 mg        500 mg Oral Daily 06/02/24 1355     06/02/24 1000  piperacillin -tazobactam (ZOSYN ) IVPB 3.375 g        3.375 g 12.5 mL/hr over 240 Minutes Intravenous Every 8 hours 06/02/24 0951     06/01/24 1800  piperacillin -tazobactam (ZOSYN ) IVPB 3.375 g  Status:  Discontinued        3.375 g 12.5 mL/hr over 240 Minutes Intravenous Every 8 hours 06/01/24 1153 06/01/24 1621   06/01/24 1800  cefTRIAXone  (ROCEPHIN ) 2 g in sodium chloride  0.9 % 100 mL IVPB  Status:  Discontinued        2 g 200 mL/hr over 30 Minutes Intravenous Every 24 hours 06/01/24 1621 06/02/24 0944   06/01/24 1800  metroNIDAZOLE  (FLAGYL ) tablet 500 mg  Status:  Discontinued        500 mg Oral Every 12 hours 06/01/24 1621 06/02/24 0944   06/01/24 1200  piperacillin -tazobactam (ZOSYN ) IVPB 3.375 g  3.375 g 100 mL/hr over 30 Minutes Intravenous  Once 06/01/24 1151 06/01/24 1229       Medications: Scheduled Meds:  acetaminophen   1,000 mg Oral Q6H   Or   acetaminophen   650 mg Rectal Q6H   buPROPion   450 mg Oral Daily   Chlorhexidine  Gluconate Cloth  6 each Topical Daily   diltiazem   240 mg Oral Daily   docusate sodium   100 mg Oral BID   feeding supplement  1 Container Oral BID BM   [START ON 06/05/2024] fluconazole   200 mg Oral Weekly   pantoprazole   40 mg Oral BID   polyethylene glycol  17 g Oral Daily   rosuvastatin   40 mg Oral Daily   tamsulosin   0.4 mg Oral Daily   valACYclovir   500 mg Oral Daily   Continuous Infusions:  heparin  1,600 Units/hr (06/04/24 0549)   piperacillin -tazobactam (ZOSYN )  IV 3.375 g (06/04/24 0546)   PRN Meds:.butalbital -acetaminophen -caffeine , hydrALAZINE , HYDROcodone -acetaminophen , HYDROmorphone  (DILAUDID ) injection, labetalol , ondansetron  **OR** ondansetron  (ZOFRAN ) IV, oxybutynin   Assessment/Plan: Patient Active Problem List   Diagnosis Date Noted    Acute urinary retention 06/01/2024   History of DVT (deep vein thrombosis) 03/09/2024   History of factor V Leiden mutation 03/09/2024   Migraine without status migrainosus, not intractable 03/09/2024   Diverticulitis of large intestine with abscess 09/21/2023   Palpitations 06/11/2020   Chronic migraine without aura, intractable, with status migrainosus 04/09/2017   New daily persistent headache 03/29/2017   Urinary frequency 09/02/2015   History of diverticulitis 08/09/2015   BMI 26.0-26.9,adult 09/17/2014   Adjustment disorder with mixed anxiety and depressed mood 02/19/2014   Chronic cluster headache, not intractable 02/19/2014   BURSITIS, HIP 09/30/2009   Backache 10/17/2008   Allergic rhinitis 07/11/2008   Benign prostatic hyperplasia 07/21/2007   Essential hypertension 01/05/2007   PSORIASIS 01/05/2007   54 y.o. male with recurrent diverticulitis admitted with acute episode with intramural abscess  -Afebrile, wbc better, looks more comfortable -Adv to soft diet , add protein shakes   - Discussed with patient and mom at bedside that ideally can get through this acute episode with IV antibiotics alone and get him to an outpatient setting for elective partial colectomy. Dr Ann is recommending him see one of our colorectal surgeons instead of her now. .  discussion with pt and mom about ideal plan for short interval partial colectomy if he continues to improve. Also discussed indications for proceeding with surgery this admission.  Also discussed that it may not be possible for us  to get him on the books with one of our colorectal surgeons for the OR before he has another episode; again discussed possible operative outcomes with one of our CR surgeons - partial colectomy with anastomosis, partial colectomy with anastomosis with diverting loop ileostomy, vs hartmann's procedures - depends on intra-op findings at time of surgery.    -  - Continue IV abx Factor V - Okay for heparin   gtt, do not restart xarelto   Urinary retention -per TRH   I reviewed hospitalist notes, last 24 h vitals and pain scores, last 48 h intake and output, last 24 h labs and trends, and last 24 h imaging results.   This care required moderate level of medical decision making.  Disposition: from a colon perspective we are moving in the right direction for nonopt mgmt this admission, would hold off on xarelto  again today  LOS: 3 days    Camellia HERO. Tanda, MD, FACS General, Bariatric, & Minimally Invasive Surgery [phone])  [phone] [facility], [facility]

## 2024-06-04 NOTE — Progress Notes (Signed)
 PHARMACY - ANTICOAGULATION CONSULT NOTE  Pharmacy Consult for heparin  Indication: factor V leiden / DVT (Xarelto  on hold)  Allergies[1]  Patient Measurements: Height: 5' 9 (175.3 cm) Weight: 87.5 kg (193 lb) IBW/kg (Calculated) : 70.7 HEPARIN  DW (KG): 87.5  Vital Signs: Temp: 98 F (36.7 C) (01/04 2005) Temp Source: Oral (01/04 2005) BP: 138/93 (01/04 2005) Pulse Rate: 91 (01/04 2005)  Labs: Recent Labs    06/02/24 0451 06/02/24 1409 06/02/24 2302 06/03/24 0510 06/03/24 0815 06/03/24 0820 06/03/24 1450 06/04/24 0410 06/04/24 1405 06/04/24 2017  HGB 14.2  --   --  13.5  --   --   --  14.2  --   --   HCT 41.6  --   --  39.9  --   --   --  41.8  --   --   PLT 291  --   --  233  --   --   --  248  --   --   APTT 122* 104* 105*  --   --  70*  --   --   --   --   HEPARINUNFRC 0.98*  --   --   --    < >  --    < > 0.30 0.33 0.35  CREATININE 1.01  --   --  1.11  --   --   --  1.04  --   --    < > = values in this interval not displayed.    Estimated Creatinine Clearance: 88.9 mL/min (by C-G formula based on SCr of 1.04 mg/dL).   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 Pt admitted with diverticulitis with abscess. Xarelto  on hold pending potential surgical intervention. Pharmacy consulted to dose heparin .  Today, 06/04/2024: Confirmatory Heparin  level = 0.35 - remains therapeutic on heparin  infusion of 1600 units/hr  CBC: WNL, stable CCS notes some bleeding around Foley. No other complications reported  Goal of Therapy: Heparin  level 0.3-0.7 units/ml Monitor platelets by anticoagulation protocol: Yes  Plan: Continue heparin  infusion of 1600 units/hr CBC, heparin  level daily Monitor for signs of bleeding  If patient will require surgical intervention, will need orders for when to hold heparin  peri-operatively.    Rosaline IVAR Edison, Pharm.D Use  secure chat for questions 06/04/2024 8:37 PM     [1]  Allergies Allergen Reactions   Bismuth Subsalicylate Anaphylaxis, Swelling and Other (See Comments)    The throat swells   Compazine  [Prochlorperazine  Edisylate] Anxiety and Other (See Comments)    Cervical dystonia and panic attacks, also

## 2024-06-05 ENCOUNTER — Other Ambulatory Visit (HOSPITAL_COMMUNITY): Payer: Self-pay

## 2024-06-05 ENCOUNTER — Other Ambulatory Visit: Payer: Self-pay | Admitting: Urology

## 2024-06-05 DIAGNOSIS — K5720 Diverticulitis of large intestine with perforation and abscess without bleeding: Secondary | ICD-10-CM | POA: Diagnosis not present

## 2024-06-05 LAB — HEPARIN LEVEL (UNFRACTIONATED): Heparin Unfractionated: 0.43 [IU]/mL (ref 0.30–0.70)

## 2024-06-05 LAB — BASIC METABOLIC PANEL WITH GFR
Anion gap: 13 (ref 5–15)
BUN: 13 mg/dL (ref 6–20)
CO2: 21 mmol/L — ABNORMAL LOW (ref 22–32)
Calcium: 9.1 mg/dL (ref 8.9–10.3)
Chloride: 103 mmol/L (ref 98–111)
Creatinine, Ser: 1.11 mg/dL (ref 0.61–1.24)
GFR, Estimated: >60 mL/min
Glucose, Bld: 94 mg/dL (ref 70–99)
Potassium: 3.7 mmol/L (ref 3.5–5.1)
Sodium: 136 mmol/L (ref 135–145)

## 2024-06-05 LAB — MRSA NEXT GEN BY PCR, NASAL: MRSA by PCR Next Gen: NOT DETECTED

## 2024-06-05 LAB — CBC
HCT: 41.5 % (ref 39.0–52.0)
Hemoglobin: 13.9 g/dL (ref 13.0–17.0)
MCH: 30.3 pg (ref 26.0–34.0)
MCHC: 33.5 g/dL (ref 30.0–36.0)
MCV: 90.6 fL (ref 80.0–100.0)
Platelets: 277 K/uL (ref 150–400)
RBC: 4.58 MIL/uL (ref 4.22–5.81)
RDW: 12.6 % (ref 11.5–15.5)
WBC: 8.6 K/uL (ref 4.0–10.5)
nRBC: 0 % (ref 0.0–0.2)

## 2024-06-05 LAB — HEMOGLOBIN A1C
Hgb A1c MFr Bld: 5.3 % (ref 4.8–5.6)
Mean Plasma Glucose: 105.41 mg/dL

## 2024-06-05 LAB — MAGNESIUM: Magnesium: 2.6 mg/dL — ABNORMAL HIGH (ref 1.7–2.4)

## 2024-06-05 LAB — PHOSPHORUS: Phosphorus: 3.6 mg/dL (ref 2.5–4.6)

## 2024-06-05 MED ORDER — BUPIVACAINE LIPOSOME 1.3 % IJ SUSP: 20.0000 mL | INTRAMUSCULAR | Status: AC

## 2024-06-05 MED ORDER — POLYETHYLENE GLYCOL 3350 17 GM/SCOOP PO POWD: 238.0000 g | Freq: Once | ORAL | Status: AC

## 2024-06-05 MED ORDER — NEOMYCIN SULFATE 500 MG PO TABS
1000.0000 mg | ORAL_TABLET | ORAL | Status: AC
  Administered 2024-06-05 (×3): 1000 mg via ORAL
  Filled 2024-06-05 (×3): qty 2

## 2024-06-05 MED ORDER — GABAPENTIN 100 MG PO CAPS
200.0000 mg | ORAL_CAPSULE | ORAL | Status: AC
  Administered 2024-06-06: 200 mg via ORAL
  Filled 2024-06-05: qty 2

## 2024-06-05 MED ORDER — ENOXAPARIN SODIUM 40 MG/0.4ML IJ SOSY: 40.0000 mg | PREFILLED_SYRINGE | Freq: Once | INTRAMUSCULAR | Status: DC

## 2024-06-05 MED ORDER — ENSURE PRE-SURGERY PO LIQD
592.0000 mL | Freq: Once | ORAL | Status: DC
  Filled 2024-06-05: qty 592

## 2024-06-05 MED ORDER — SODIUM CHLORIDE 0.9 % IV SOLN
2.0000 g | INTRAVENOUS | Status: AC
  Administered 2024-06-06: 2 g via INTRAVENOUS
  Filled 2024-06-05: qty 2

## 2024-06-05 MED ORDER — ENSURE PRE-SURGERY PO LIQD
296.0000 mL | Freq: Once | ORAL | Status: AC
  Administered 2024-06-06: 296 mL via ORAL
  Filled 2024-06-05 (×2): qty 296

## 2024-06-05 MED ORDER — BISACODYL 5 MG PO TBEC
20.0000 mg | DELAYED_RELEASE_TABLET | Freq: Once | ORAL | Status: AC
  Administered 2024-06-05: 20 mg via ORAL
  Filled 2024-06-05: qty 4

## 2024-06-05 MED ORDER — POTASSIUM CHLORIDE CRYS ER 20 MEQ PO TBCR
30.0000 meq | EXTENDED_RELEASE_TABLET | Freq: Once | ORAL | Status: AC
  Administered 2024-06-05: 30 meq via ORAL
  Filled 2024-06-05: qty 1

## 2024-06-05 MED ORDER — ACETAMINOPHEN 500 MG PO TABS
1000.0000 mg | ORAL_TABLET | ORAL | Status: AC
  Administered 2024-06-06: 1000 mg via ORAL
  Filled 2024-06-05: qty 2

## 2024-06-05 MED ORDER — POLYETHYLENE GLYCOL 3350 17 GM/SCOOP PO POWD
238.0000 g | Freq: Once | ORAL | Status: AC
  Administered 2024-06-05: 238 g via ORAL
  Filled 2024-06-05: qty 238

## 2024-06-05 MED ORDER — METRONIDAZOLE 500 MG PO TABS: 1000.0000 mg | ORAL_TABLET | ORAL | Status: DC

## 2024-06-05 MED ORDER — ALVIMOPAN 12 MG PO CAPS
12.0000 mg | ORAL_CAPSULE | ORAL | Status: AC
  Administered 2024-06-06: 12 mg via ORAL
  Filled 2024-06-05: qty 1

## 2024-06-05 NOTE — Consult Note (Signed)
 WOC Nurse requested for preoperative stoma site marking need for colectomy 1/6 Tuesday, possible ostomy   Discussed surgical procedure and stoma creation with patient and family.  Explained role of the WOC nurse team.  Provided the patient with educational booklet and provided samples of pouching options.  Answered patient and family questions.   Examined patient lying, sitting in order to place the marking in the patient's visual field, away from any creases or abdominal contour issues and within the rectus muscle.  Marked for colostomy in the LLQ  __3__ cm to the left of the umbilicus and _2.5___cm below the umbilicus.  Marked for ileostomy in the RLQ  3.5____cm to the right of the umbilicus and  _1.5___ cm below the umbilicus.    Patient's abdomen cleansed with CHG wipes at site markings, allowed to air dry prior to marking.Covered mark with thin film transparent dressing to preserve mark until date of surgery.   Danzel Marszalek [facility], CNS, CWON-AP [phone]

## 2024-06-05 NOTE — Anesthesia Preprocedure Evaluation (Addendum)
 "                                  Anesthesia Evaluation  Patient identified by MRN, date of birth, ID band Patient awake    Reviewed: Allergy & Precautions, NPO status , Patient's Chart, lab work & pertinent test results  Airway Mallampati: II  TM Distance: >3 FB Neck ROM: Full    Dental no notable dental hx. (+) Teeth Intact, Dental Advisory Given   Pulmonary sleep apnea    Pulmonary exam normal breath sounds clear to auscultation       Cardiovascular hypertension, Pt. on medications and Pt. on home beta blockers (-) angina + DVT (on xarelto )  (-) Past MI Normal cardiovascular exam Rhythm:Regular Rate:Normal     Neuro/Psych  Headaches  Anxiety        GI/Hepatic ,GERD  ,,Recurrent diverticulitis   Endo/Other    Renal/GU Renal diseaseLab Results      Component                Value               Date                                  K                        3.7                 06/05/2024                CREATININE               1.11                06/05/2024                        Musculoskeletal negative musculoskeletal ROS (+)    Abdominal   Peds  Hematology factor V Leiden Lab Results      Component                Value               Date                      WBC                      8.6                 06/05/2024                HGB                      13.9                06/05/2024                HCT                      41.5                06/05/2024                MCV  90.6                06/05/2024                PLT                      277                 06/05/2024              Anesthesia Other Findings All: compazine , bismuth  Reproductive/Obstetrics negative OB ROS                              Anesthesia Physical Anesthesia Plan  ASA: 3  Anesthesia Plan: General   Post-op Pain Management: Lidocaine  infusion*, Ketamine  IV* and Ofirmev  IV (intra-op)*   Induction:  Intravenous  PONV Risk Score and Plan: 3 and 4 or greater and Midazolam , Treatment may vary due to age or medical condition, Dexamethasone , Ondansetron  and Scopolamine  patch - Pre-op  Airway Management Planned: Oral ETT  Additional Equipment: None  Intra-op Plan:   Post-operative Plan: Extubation in OR  Informed Consent: I have reviewed the patients History and Physical, chart, labs and discussed the procedure including the risks, benefits and alternatives for the proposed anesthesia with the patient or authorized representative who has indicated his/her understanding and acceptance.     Dental advisory given  Plan Discussed with: CRNA and Surgeon  Anesthesia Plan Comments:          Anesthesia Quick Evaluation  "

## 2024-06-05 NOTE — H&P (View-Only) (Signed)
 06/05/2024  Benjamin [name] [id number] 1971/02/28  CARE TEAM: PCP: Gretta Ozell CROME, PA-C  Outpatient Care Team: Patient Care Team: Gretta Ozell CROME [name] as PCP - General (Urgent Care) [name], MD as Consulting Physician (Ophthalmology) Aneita Gwendlyn DASEN, MD (Inactive) as Consulting Physician (Gastroenterology) Buck Saucer, MD as Consulting Physician (Neurology) Ines Onetha NOVAK, MD as Consulting Physician (Neurology) Timmy Maude SAUNDERS, MD as Consulting Physician (Oncology) Franchot Lauraine HERO, NP as Nurse Practitioner (Hematology and Oncology)  Inpatient Treatment Team: Treatment Team:  [place], Eric J, DO [facility], Md, MD Ebbie Cough, MD Bobbette Cartwright, MD Kriste Asberry FORBES, RN Timmy Maude SAUNDERS, MD Joannie Leader, NT Leron Riis, [place] Paudel, Bishnu D, RN Sheldon Standing, MD Perri Tinnie LABOR, NT Shade, Wanda FORBES, RPH Estelle Hunter DEL, RN Heyward Rosaline CROME, LPN   Problem List:   Principal Problem:   Diverticulitis of large intestine with abscess Active Problems:   Essential hypertension   Benign prostatic hyperplasia   History of factor V Leiden mutation   Acute urinary retention   * No surgery found *      Assessment [facility] Stay = 4 days)      Recurrent diverticulitis slowly improving.    Plan:  I think he needs colectomy.  Looking at his CAT scan, has had intramural inflammation with no major abscess.  White count is normalized he has been on antibiotics for 5 days.  His Xarelto  was held.  He has decent performance status.  I do not think delaying another month is going to help and I worry about recurrent attacks since he has had repeated CAT scans over the past several years with recurrent flares.  I recommended colectomy.  Reasonable for robotic approach.  Hopefully should be able to do an immediate anastomosis but will mark just in case.  Given his urinary tension I worry he could have a developing colovesical fistula.  No strong  evidence but see if urology can do firefly at the start of the case to light the ureters and protect them. The anatomy & physiology of the digestive tract was discussed.  The pathophysiology of the colon was discussed.  Natural history risks without surgery was discussed.   I feel the risks of no intervention will lead to serious problems that outweigh the operative risks; therefore, I recommended a partial colectomy to remove the pathology.  Minimally invasive (Robotic/Laparoscopic) & open techniques were discussed.   Risks such as bleeding, infection, abscess, leak, reoperation, injury to other organs, need for repair of tissues / organs, possible ostomy, hernia, heart attack, stroke, death, and other risks were discussed.  I noted a good likelihood this will help address the problem.   Goals of post-operative recovery were discussed as well.   Need for adequate nutrition, daily bowel regimen and healthy physical activity, to optimize recovery was noted as well. We will work to minimize complications.  Educational materials were available as well.  Questions were answered.  The patient expresses understanding & wishes to proceed with surgery.  Since he is not obstructed see if we can switch to clear liquids and mechanical bowel prep with oral antibiotics today to minimize risk of infections and other concerns.  I usually just give 40mg  of enoxaparin  at the start of the case.  I do not think he is on constant heparin  but if he is need to hold IV drip for 6 hours preop.  -monitor electrolytes & replace as needed  Keep K>4, Mg>2, Phos>3  -VTE prophylaxis- SCDs.  Anticoagulation prophyllaxis SQ as appropriate  -mobilize as tolerated to help recovery.  Enlist therapies in moderate/high risk patients as appropriate  I updated the patient's status to the patient and his wife at bedside.  Recommendations were made.  Questions were answered.  They expressed understanding & appreciation.  -Disposition:  Probable surgery 1/6 versus 1/7 afternoon depending on robot availability Disposition:        I reviewed nursing notes, ED provider notes, hospitalist notes, last 24 h vitals and pain scores, last 48 h intake and output, last 24 h labs and trends, and last 24 h imaging results.  I have reviewed this patient's available data, including medical history, events of note, test results, etc as part of my evaluation.   A significant portion of that time was spent in counseling. Care during the described time interval was provided by me.  This care required moderate level of medical decision making.  06/05/2024    Subjective: (Chief complaint)  Patient with less soreness.  Started to have bowel movements.  Less pressure.  Tolerating soft diet.  Wife just came in to room.  Objective:  Vital signs:  Vitals:   06/04/24 0502 06/04/24 1423 06/04/24 2005 06/05/24 0514  BP: (!) 138/100 133/89 (!) 138/93 (!) 140/98  Pulse: 90 90 91 80  Resp: 14 16 16 16   Temp: 98.5 F (36.9 C) 98.1 F (36.7 C) 98 F (36.7 C) 98.1 F (36.7 C)  TempSrc: Oral Oral Oral Oral  SpO2: 96% 96% 94% 97%  Weight:      Height:        Last BM Date : 06/04/24  Intake/Output   Yesterday:  01/04 0701 - 01/05 0700 In: 1437.4 [P.O.:950; I.V.:383.2; IV Piggyback:104.2] Out: 1550 [Urine:1550] This shift:  No intake/output data recorded.  Bowel function:  Flatus: YES  BM:  YES  Drain: (No drain)   Physical Exam:  General: Pt awake/alert in no acute distress Eyes: PERRL, normal EOM.  Sclera clear.  No icterus Neuro: CN II-XII intact w/o focal sensory/motor deficits. Lymph: No head/neck/groin lymphadenopathy Psych:  No delerium/psychosis/paranoia.  Oriented x 4 HENT: Normocephalic, Mucus membranes moist.  No thrush Neck: Supple, No tracheal deviation.  No obvious thyromegaly Chest: No pain to chest wall compression.  Good respiratory excursion.  No audible wheezing CV:  Pulses intact.  Regular  rhythm.  No major extremity edema MS: Normal AROM mjr joints.  No obvious deformity  Abdomen: Soft.  Nondistended.  Tenderness at left lower quadrant/suprapubic region mild..  No evidence of peritonitis.  No incarcerated hernias.  Ext:   No deformity.  No mjr edema.  No cyanosis Skin: No petechiae / purpurea.  No major sores.  Warm and dry    Results:   Cultures: No results found for this or any previous visit (from the past 720 hours).  Labs: Results for orders placed or performed during the hospital encounter of 06/01/24 (from the past 48 hours)  Heparin  level (unfractionated)     Status: None   Collection Time: 06/03/24  8:15 AM  Result Value Ref Range   Heparin  Unfractionated 0.42 0.30 - 0.70 IU/mL    Comment: (NOTE) The clinical reportable range upper limit is being lowered to >1.10 to align with the FDA approved guidance for the current laboratory assay.  If heparin  results are below expected values, and patient dosage has  been confirmed, suggest follow up testing of antithrombin III  levels. Performed at [facility], [address] [place], [place] [address]  APTT     Status: Abnormal   Collection Time: 06/03/24  8:20 AM  Result Value Ref Range   aPTT 70 (H) 24 - 36 seconds    Comment:        IF BASELINE aPTT IS ELEVATED, SUGGEST PATIENT RISK ASSESSMENT BE USED TO DETERMINE APPROPRIATE ANTICOAGULANT THERAPY. Performed at [facility], [address] [place], [place] [address]   Heparin  level (unfractionated)     Status: Abnormal   Collection Time: 06/03/24  2:50 PM  Result Value Ref Range   Heparin  Unfractionated 0.20 (L) 0.30 - 0.70 IU/mL    Comment: (NOTE) The clinical reportable range upper limit is being lowered to >1.10 to align with the FDA approved guidance for the current laboratory assay.  If heparin  results are below expected values, and patient dosage has  been confirmed, suggest follow up testing of  antithrombin III  levels. Performed at [facility], [address] [place], [place] [address]   Heparin  level (unfractionated)     Status: None   Collection Time: 06/03/24  8:39 PM  Result Value Ref Range   Heparin  Unfractionated 0.41 0.30 - 0.70 IU/mL    Comment: (NOTE) The clinical reportable range upper limit is being lowered to >1.10 to align with the FDA approved guidance for the current laboratory assay.  If heparin  results are below expected values, and patient dosage has  been confirmed, suggest follow up testing of antithrombin III  levels. Performed at [facility], [address] [place], [place] [address]   CBC     Status: None   Collection Time: 06/04/24  4:10 AM  Result Value Ref Range   WBC 10.3 4.0 - 10.5 K/uL   RBC 4.64 4.22 - 5.81 MIL/uL   Hemoglobin 14.2 13.0 - 17.0 g/dL   HCT 58.1 60.9 - 47.9 %   MCV 90.1 80.0 - 100.0 fL   MCH 30.6 26.0 - 34.0 pg   MCHC 34.0 30.0 - 36.0 g/dL   RDW 87.3 88.4 - 84.4 %   Platelets 248 150 - 400 K/uL   nRBC 0.0 0.0 - 0.2 %    Comment: Performed at [facility], [address], [place], [place] [address]  Basic metabolic panel with GFR     Status: Abnormal   Collection Time: 06/04/24  4:10 AM  Result Value Ref Range   Sodium 135 135 - 145 mmol/L   Potassium 3.6 3.5 - 5.1 mmol/L   Chloride 101 98 - 111 mmol/L   CO2 20 (L) 22 - 32 mmol/L   Glucose, Bld 102 (H) 70 - 99 mg/dL    Comment: Glucose reference range applies only to samples taken after fasting for at least 8 hours.   BUN 8 6 - 20 mg/dL   Creatinine, Ser 8.95 0.61 - 1.24 mg/dL   Calcium  9.3 8.9 - 10.3 mg/dL   GFR, Estimated >39 >39 mL/min    Comment: (NOTE) Calculated using the CKD-EPI Creatinine Equation (2021)    Anion gap 15 5 - 15    Comment: Performed at [facility], [address], [place], [place] [address]  Magnesium      Status: Abnormal   Collection Time: 06/04/24  4:10 AM   Result Value Ref Range   Magnesium  2.5 (H) 1.7 - 2.4 mg/dL    Comment: Performed at [facility], [address], [place], [place] [address]  Heparin  level (unfractionated)     Status: None  Collection Time: 06/04/24  4:10 AM  Result Value Ref Range   Heparin  Unfractionated 0.30 0.30 - 0.70 IU/mL    Comment: (NOTE) The clinical reportable range upper limit is being lowered to >1.10 to align with the FDA approved guidance for the current laboratory assay.  If heparin  results are below expected values, and patient dosage has  been confirmed, suggest follow up testing of antithrombin III  levels. Performed at [facility], [address] [place], [place] [address]   Heparin  level (unfractionated)     Status: None   Collection Time: 06/04/24  2:05 PM  Result Value Ref Range   Heparin  Unfractionated 0.33 0.30 - 0.70 IU/mL    Comment: (NOTE) The clinical reportable range upper limit is being lowered to >1.10 to align with the FDA approved guidance for the current laboratory assay.  If heparin  results are below expected values, and patient dosage has  been confirmed, suggest follow up testing of antithrombin III  levels. Performed at [facility], [address], [place], [place] [address]   Heparin  level (unfractionated)     Status: None   Collection Time: 06/04/24  8:17 PM  Result Value Ref Range   Heparin  Unfractionated 0.35 0.30 - 0.70 IU/mL    Comment: (NOTE) The clinical reportable range upper limit is being lowered to >1.10 to align with the FDA approved guidance for the current laboratory assay.  If heparin  results are below expected values, and patient dosage has  been confirmed, suggest follow up testing of antithrombin III  levels. Performed at [facility], [address], [place], [place] [address]   CBC     Status: None   Collection Time: 06/05/24  4:47 AM  Result Value Ref Range   WBC  8.6 4.0 - 10.5 K/uL   RBC 4.58 4.22 - 5.81 MIL/uL   Hemoglobin 13.9 13.0 - 17.0 g/dL   HCT 58.4 60.9 - 47.9 %   MCV 90.6 80.0 - 100.0 fL   MCH 30.3 26.0 - 34.0 pg   MCHC 33.5 30.0 - 36.0 g/dL   RDW 87.3 88.4 - 84.4 %   Platelets 277 150 - 400 K/uL   nRBC 0.0 0.0 - 0.2 %    Comment: Performed at [facility], [address] [place], [place] [address]  Heparin  level (unfractionated)     Status: None   Collection Time: 06/05/24  4:47 AM  Result Value Ref Range   Heparin  Unfractionated 0.43 0.30 - 0.70 IU/mL    Comment: (NOTE) The clinical reportable range upper limit is being lowered to >1.10 to align with the FDA approved guidance for the current laboratory assay.  If heparin  results are below expected values, and patient dosage has  been confirmed, suggest follow up testing of antithrombin III  levels. Performed at [facility], [address], [place], [place] [address]   Basic metabolic panel     Status: Abnormal   Collection Time: 06/05/24  4:47 AM  Result Value Ref Range   Sodium 136 135 - 145 mmol/L   Potassium 3.7 3.5 - 5.1 mmol/L   Chloride 103 98 - 111 mmol/L   CO2 21 (L) 22 - 32 mmol/L   Glucose, Bld 94 70 - 99 mg/dL    Comment: Glucose reference range applies only to samples taken after fasting for at least 8 hours.   BUN 13 6 - 20 mg/dL   Creatinine, Ser 8.88 0.61 - 1.24 mg/dL   Calcium  9.1  8.9 - 10.3 mg/dL   GFR, Estimated >39 >39 mL/min    Comment: (NOTE) Calculated using the CKD-EPI Creatinine Equation (2021)    Anion gap 13 5 - 15    Comment: Performed at [facility], [address] [place], [place] [address]  Magnesium      Status: Abnormal   Collection Time: 06/05/24  4:47 AM  Result Value Ref Range   Magnesium  2.6 (H) 1.7 - 2.4 mg/dL    Comment: Performed at [facility], [address] [place], [place] [address]  Phosphorus     Status: None   Collection Time: 06/05/24  4:47  AM  Result Value Ref Range   Phosphorus 3.6 2.5 - 4.6 mg/dL    Comment: Performed at [facility], [address] [place], [place] [address]    Imaging / Studies: No results found.  Medications / Allergies: per chart  Antibiotics: Anti-infectives (From admission, onward)    Start     Dose/Rate Route Frequency Ordered Stop   06/05/24 1000  fluconazole  (DIFLUCAN ) tablet 200 mg        200 mg Oral Weekly 06/02/24 1355     06/02/24 1430  valACYclovir  (VALTREX ) tablet 500 mg        500 mg Oral Daily 06/02/24 1355     06/02/24 1000  piperacillin -tazobactam (ZOSYN ) IVPB 3.375 g        3.375 g 12.5 mL/hr over 240 Minutes Intravenous Every 8 hours 06/02/24 0951     06/01/24 1800  piperacillin -tazobactam (ZOSYN ) IVPB 3.375 g  Status:  Discontinued        3.375 g 12.5 mL/hr over 240 Minutes Intravenous Every 8 hours 06/01/24 1153 06/01/24 1621   06/01/24 1800  cefTRIAXone  (ROCEPHIN ) 2 g in sodium chloride  0.9 % 100 mL IVPB  Status:  Discontinued        2 g 200 mL/hr over 30 Minutes Intravenous Every 24 hours 06/01/24 1621 06/02/24 0944   06/01/24 1800  metroNIDAZOLE  (FLAGYL ) tablet 500 mg  Status:  Discontinued        500 mg Oral Every 12 hours 06/01/24 1621 06/02/24 0944   06/01/24 1200  piperacillin -tazobactam (ZOSYN ) IVPB 3.375 g        3.375 g 100 mL/hr over 30 Minutes Intravenous  Once 06/01/24 1151 06/01/24 1229         Note: Portions of this report may have been transcribed using voice recognition software. Every effort was made to ensure accuracy; however, inadvertent computerized transcription errors may be present.   Any transcriptional errors that result from this process are unintentional.    Elspeth KYM Schultze, MD, FACS, MASCRS Esophageal, Gastrointestinal & Colorectal Surgery Robotic and Minimally Invasive Surgery  [facility] [address] [place], [place] [address] [phone]  Fax [phone] Main  CONTACT INFORMATION: Weekday (9AM-5PM): Call [facility] main office at [phone] Weeknight (5PM-9AM) or Weekend/Holiday: Check EPIC Web Links tab & use AMION (password  [email or web address]) for General Surgery [facility] coverage  Please, DO NOT use SecureChat  (it is not reliable communication to reach operating surgeons & will lead to a delay in care).   Epic staff messaging available for outpatient concerns needing 1-2 business day response.      06/05/2024  7:47 AM

## 2024-06-05 NOTE — Plan of Care (Signed)
 ?  Problem: Clinical Measurements: ?Goal: Ability to maintain clinical measurements within normal limits will improve ?Outcome: Progressing ?Goal: Will remain free from infection ?Outcome: Progressing ?Goal: Diagnostic test results will improve ?Outcome: Progressing ?  ?

## 2024-06-05 NOTE — Progress Notes (Addendum)
 " PROGRESS NOTE    Benjamin [name]  FMW:[id number] DOB: [birth year] DOA: 06/01/2024 PCP: Gretta Ozell CROME, PA-C    Brief Narrative:   Benjamin [name] is a 54 y.o. 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  Onset night prior.  History of recurrent bouts of diverticulitis.  Patient reports he took some Augmentin  he had at home for few days about 1 week ago that resolved his pain.  But since pain recurred overnight.  Has been seen outpatient by [facility] surgery, Dr. Ebb in June 2025.  In the ED, temperature 98.7 F, HR 126, RR 20, BP 191/128, SpO2 98% on room air.  WBC 11.8, hemoglobin 15.7, plate count 645.  Sodium 139, potassium 4.4, chloride 104, CO2 22, glucose 102, BUN 17, creat 1.15.  AST 26, ALT 20, total bilirubin 0.5.  Lipase less than 10.  Urinalysis unrevealing.  CT abdomen/pelvis with contrast with acute diverticulitis involving the mid sigmoid colon with suspected peripherally enhancing intramural abscess measuring approximately 3.0 x 1.9 cm, no evidence of perforation.  Assessment & Plan:   Acute diverticulitis with intramural abscess, recurrent Patient presenting with recurrent left lower quadrant abdominal pain, onset night prior to ED presentation.  But does report recurrence of pain 1 week ago in which she took Augmentin  with resolution of symptoms.  Patient has afebrile with elevated WBC count of 11.8.  CT abdomen/pelvis with acute diverticulitis mid sigmoid colon with intramural abscess measuring 3.0 x 1.9 cm, no evidence of perforation. -- General Surgery following, appreciate assistance -- WBC 11.8>15.9>9.9>10.3>8.6 -- Zosyn  3.375 g IV q8h  -- Holding Xarelto  -- Hydrocodone -acetaminophen  5/325 mg 1-2 tablets q4h PRN moderate/severe pain -- Dilaudid  0.5 mg IV every 2 hours as needed severe  breakthrough pain not relieved with oral medication -- General Surgery plans robot-assisted sigmoid colectomy tomorrow with Dr. Sheldon -- Clear liquid diet, n.p.o. after midnight -- Further per general surgery  Acute urinary retention Patient developed urinary retention while boarding in the ED with bladder scan greater than 300 mL retained.  Initial attempts at In-N-Out catheterization unsuccessful by RN, Foley placed coud catheter by ER MD.  Suspect etiology to his obstructive process related to acute inflammatory changes within his abdomen from acute diverticulitis as above.  CT ab/pelvis with no hydronephrosis. -- Tamsulosin  0.4 g p.o. daily -- Oxybutynin  5 mg p.o. every 8 hours as needed bladder spasms -- Continue Foley catheter and will attempt to remove postoperatively  Hypokalemia Repleted.  Potassium 3.7 this morning  History of factor V Leiden/recurrent DVT On Xarelto  outpatient. -- Continue to hold Xarelto  per general surgery -- Heparin  drip, pharmacy consulted for dosing/monitoring  HTN --Continue Cardizem  240 mg p.o. daily -- hold home olmesatan-medoxomil -- Hydralazine  20 mg p.o. q6h PRN SBP >165  History of headache -- Fioricet  as needed  Depression -- Wellbutrin  450 mg PO daily  GERD -- Protonix  40 mg p.o. twice daily (substituted for home Nexium )   DVT prophylaxis: SCD's Start: 06/05/24 0811 SCDs Start: 06/01/24 1622    Code Status: Full Code Family Communication: Updated spouse present at bedside this morning  Disposition Plan:  Level of care: Med-Surg Status is: Inpatient Remains inpatient appropriate because: IV antibiotics, robotic sigmoid colectomy planned for tomorrow    Consultants:  General Surgery  Procedures:  None  Antimicrobials:  Ceftriaxone  Metronidazole  Zosyn  1/2>>   Subjective: Patient  seen examined bedside, lying in bed.  Abdominal pain relatively resolved.  Seen by general surgery this morning with plans for robotic  assisted colectomy tomorrow.  Discussed will continue Foley catheter until postoperative with attempt for voiding trial thereafter.  No other questions or concerns at this time.  Denies current headache, no dizziness, no chest pain, no palpitations, no fever/chills/night sweats, no nausea/vomiting/diarrhea, no focal weakness, no fatigue, no paresthesia.  No acute events overnight per nursing.  Objective: Vitals:   06/04/24 0502 06/04/24 1423 06/04/24 2005 06/05/24 0514  BP: (!) 138/100 133/89 (!) 138/93 (!) 140/98  Pulse: 90 90 91 80  Resp: 14 16 16 16   Temp: 98.5 F (36.9 C) 98.1 F (36.7 C) 98 F (36.7 C) 98.1 F (36.7 C)  TempSrc: Oral Oral Oral Oral  SpO2: 96% 96% 94% 97%  Weight:      Height:        Intake/Output Summary (Last 24 hours) at 06/05/2024 1008 Last data filed at 06/05/2024 0930 Gross per 24 hour  Intake 1557.42 ml  Output 1150 ml  Net 407.42 ml   Filed Weights   06/01/24 0927  Weight: 87.5 kg    Examination:  Physical Exam: GEN: NAD, alert and oriented x 3, wd/wn HEENT: NCAT, PERRL, EOMI, sclera clear, MMM PULM: CTAB w/o wheezes/crackles, normal respiratory effort on room air CV: RRR w/o M/G/R GI: abd soft, nondistended, minimal TTP LLQ, bowel sounds present GU: Foley catheter noted with clear yellow urine in collection bag MSK: no peripheral edema, moves all extremities independently with preserved muscle strength NEURO: No focal neurologic deficit PSYCH: normal mood/affect Integumentary: dry/intact, no rashes or wounds    Data Reviewed: I have personally reviewed following labs and imaging studies  CBC: Recent Labs  Lab 06/01/24 0937 06/02/24 0451 06/03/24 0510 06/04/24 0410 06/05/24 0447  WBC 11.8* 15.9* 9.9 10.3 8.6  NEUTROABS  --  13.3*  --   --   --   HGB 15.7 14.2 13.5 14.2 13.9  HCT 44.5 41.6 39.9 41.8 41.5  MCV 86.9 89.1 90.1 90.1 90.6  PLT 354 291 233 248 277   Basic Metabolic Panel: Recent Labs  Lab 06/01/24 0937  06/02/24 0451 06/03/24 0510 06/04/24 0410 06/05/24 0447  NA 139 133* 137 135 136  K 4.4 3.9 3.4* 3.6 3.7  CL 104 98 101 101 103  CO2 22 23 24  20* 21*  GLUCOSE 102* 112* 97 102* 94  BUN 17 11 9 8 13   CREATININE 1.15 1.01 1.11 1.04 1.11  CALCIUM  9.9 9.1 9.1 9.3 9.1  MG  --   --   --  2.5* 2.6*  PHOS  --   --   --   --  3.6   GFR: Estimated Creatinine Clearance: 83.3 mL/min (by C-G formula based on SCr of 1.11 mg/dL). Liver Function Tests: Recent Labs  Lab 06/01/24 0937 06/02/24 0451  AST 26 19  ALT 20 18  ALKPHOS 82 71  BILITOT 0.5 0.8  PROT 8.0 7.0  ALBUMIN 4.6 4.1   Recent Labs  Lab 06/01/24 0937  LIPASE <10*   No results for input(s): AMMONIA in the last 168 hours. Coagulation Profile: No results for input(s): INR, PROTIME in the last 168 hours. Cardiac Enzymes: No results for input(s): CKTOTAL, CKMB, CKMBINDEX, TROPONINI in the last 168 hours. BNP (last 3 results) No results for input(s): PROBNP in the last 8760 hours. HbA1C: No results for input(s): HGBA1C in the last 72 hours. CBG: No results for input(s):  GLUCAP in the last 168 hours. Lipid Profile: No results for input(s): CHOL, HDL, LDLCALC, TRIG, CHOLHDL, LDLDIRECT in the last 72 hours. Thyroid  Function Tests: No results for input(s): TSH, T4TOTAL, FREET4, T3FREE, THYROIDAB in the last 72 hours. Anemia Panel: No results for input(s): VITAMINB12, FOLATE, FERRITIN, TIBC, IRON, RETICCTPCT in the last 72 hours. Sepsis Labs: No results for input(s): PROCALCITON, LATICACIDVEN in the last 168 hours.  No results found for this or any previous visit (from the past 240 hours).       Radiology Studies: No results found.       Scheduled Meds:  acetaminophen   1,000 mg Oral Q6H   Or   acetaminophen   650 mg Rectal Q6H   [START ON 06/06/2024] acetaminophen   1,000 mg Oral On Call to OR   [START ON 06/06/2024] alvimopan   12 mg Oral On Call to OR    bisacodyl   20 mg Oral Once   bupivacaine  liposome  20 mL Infiltration On Call to OR   buPROPion   450 mg Oral Daily   Chlorhexidine  Gluconate Cloth  6 each Topical Daily   diltiazem   240 mg Oral Daily   docusate sodium   100 mg Oral BID   feeding supplement  1 Container Oral BID BM   [START ON 06/06/2024] feeding supplement  296 mL Oral Once   feeding supplement  592 mL Oral Once   fluconazole   200 mg Oral Weekly   [START ON 06/06/2024] gabapentin   200 mg Oral On Call to OR   neomycin   1,000 mg Oral 3 times per day   pantoprazole   40 mg Oral BID   polyethylene glycol  17 g Oral Daily   polyethylene glycol powder  238 g Oral Once   rosuvastatin   40 mg Oral Daily   tamsulosin   0.4 mg Oral Daily   valACYclovir   500 mg Oral Daily   Continuous Infusions:  [START ON 06/06/2024] cefoTEtan  (CEFOTAN ) IV     heparin  1,600 Units/hr (06/04/24 2354)   piperacillin -tazobactam (ZOSYN )  IV 3.375 g (06/05/24 0543)     LOS: 4 days    Time spent: 51 minutes spent on 06/05/2024 caring for this patient face-to-face including chart review, ordering labs/tests, documenting, discussion with nursing staff, consultants, updating family and interview/physical exam    Camellia PARAS Modesto Ganoe, DO [facility] Available via Epic secure chat 7am-7pm After these hours, please refer to coverage provider listed on [email or web address] 06/05/2024, 10:08 AM   "

## 2024-06-05 NOTE — Progress Notes (Signed)
" °   06/05/24 0924  TOC Brief Assessment  Insurance and Status Reviewed  Patient has primary care physician Yes  Home environment has been reviewed home with spouse  Prior level of function: independent  Prior/Current Home Services No current home services  Social Drivers of Health Review SDOH reviewed no interventions necessary  Readmission risk has been reviewed Yes  Transition of care needs no transition of care needs at this time    "

## 2024-06-05 NOTE — Progress Notes (Addendum)
 PHARMACY - ANTICOAGULATION CONSULT NOTE  Pharmacy Consult for heparin  Indication: factor V leiden / DVT (Xarelto  on hold)  Allergies[1]  Patient Measurements: Height: 5' 9 (175.3 cm) Weight: 87.5 kg (193 lb) IBW/kg (Calculated) : 70.7 HEPARIN  DW (KG): 87.5  Vital Signs: Temp: 98.1 F (36.7 C) (01/05 0514) Temp Source: Oral (01/05 0514) BP: 140/98 (01/05 0514) Pulse Rate: 80 (01/05 0514)  Labs: Recent Labs     0000 06/02/24 1409 06/02/24 2302 06/03/24 0510 06/03/24 0815 06/03/24 0820 06/03/24 1450 06/04/24 0410 06/04/24 1405 06/04/24 2017 06/05/24 0447  HGB   < >  --   --  13.5  --   --   --  14.2  --   --  13.9  HCT  --   --   --  39.9  --   --   --  41.8  --   --  41.5  PLT  --   --   --  233  --   --   --  248  --   --  277  APTT  --  104* 105*  --   --  70*  --   --   --   --   --   HEPARINUNFRC  --   --   --   --    < >  --    < > 0.30 0.33 0.35 0.43  CREATININE  --   --   --  1.11  --   --   --  1.04  --   --  1.11   < > = values in this interval not displayed.    Estimated Creatinine Clearance: 83.3 mL/min (by C-G formula based on SCr of 1.11 mg/dL).   Medications: PTA Xarelto  -Last dose: 12/31 AM  Assessment: Pt is a 44 yoM who takes Xarelto  PTA for history of  FV Leiden with history of multiple DVTs. Pt admitted with diverticulitis with abscess. Xarelto  on hold pending potential surgical intervention. Pharmacy consulted to dose heparin .  Today, 06/05/2024: Heparin  level 0.43, remains therapeutic on heparin  1600 units/hr  CBC: WNL, stable CCS noted bleeding around Foley on 1/4, but is resolved on 1/5. No other complications reported  Goal of Therapy: Heparin  level 0.3-0.7 units/ml Monitor platelets by anticoagulation protocol: Yes  Plan: Continue heparin  infusion of 1600 units/hr CBC, heparin  level daily Monitor for signs of bleeding  If patient will require surgical intervention, will need orders for when to hold heparin  peri-operatively.    Wanda Hasting PharmD, BCPS [facility] main pharmacy [phone] 06/05/2024 7:33 AM    Addendum: Plan is for colectomy on 1/6 per Dr. Sheldon.  He requested heparin  to be held 6 hours prior to OR.  Pharmacy will follow up on procedure timing and will enter orders to stop heparin .   Wanda Hasting PharmD, BCPS [facility] main pharmacy [phone] 06/05/2024 8:38 AM      [1]  Allergies Allergen Reactions   Bismuth Subsalicylate Anaphylaxis, Swelling and Other (See Comments)    The throat swells   Compazine  [Prochlorperazine  Edisylate] Anxiety and Other (See Comments)    Cervical dystonia and panic attacks, also

## 2024-06-05 NOTE — Progress Notes (Signed)
 Unon handoff report, noted that pt's Heparin  Drip had been paused for several hours by previous shift nurse. Pt continues to have an active order for continuous Heparin  infusion until morning.Pharmacist notified regarding interruption of Heparin  infusion. Pharmacist advised that Heparin  infusion scheduled to be discontinued at 0615 in the morning, and instructed to resume the Heparin  drip at the same previous rate.Heparin  drip restarted at 16ml/hrs  per pharmacist recommendation. Will continue to monitor.

## 2024-06-05 NOTE — Progress Notes (Signed)
 06/05/2024  Benjamin [name] [id number] 1971/02/28  CARE TEAM: PCP: Gretta Ozell CROME, PA-C  Outpatient Care Team: Patient Care Team: Gretta Ozell CROME [name] as PCP - General (Urgent Care) [name], MD as Consulting Physician (Ophthalmology) Aneita Gwendlyn DASEN, MD (Inactive) as Consulting Physician (Gastroenterology) Buck Saucer, MD as Consulting Physician (Neurology) Ines Onetha NOVAK, MD as Consulting Physician (Neurology) Timmy Maude SAUNDERS, MD as Consulting Physician (Oncology) Franchot Lauraine HERO, NP as Nurse Practitioner (Hematology and Oncology)  Inpatient Treatment Team: Treatment Team:  [place], Eric J, DO [facility], Md, MD Ebbie Cough, MD Bobbette Cartwright, MD Kriste Asberry FORBES, RN Timmy Maude SAUNDERS, MD Joannie Leader, NT Leron Riis, [place] Paudel, Bishnu D, RN Sheldon Standing, MD Perri Tinnie LABOR, NT Shade, Wanda FORBES, RPH Estelle Hunter DEL, RN Heyward Rosaline CROME, LPN   Problem List:   Principal Problem:   Diverticulitis of large intestine with abscess Active Problems:   Essential hypertension   Benign prostatic hyperplasia   History of factor V Leiden mutation   Acute urinary retention   * No surgery found *      Assessment [facility] Stay = 4 days)      Recurrent diverticulitis slowly improving.    Plan:  I think he needs colectomy.  Looking at his CAT scan, has had intramural inflammation with no major abscess.  White count is normalized he has been on antibiotics for 5 days.  His Xarelto  was held.  He has decent performance status.  I do not think delaying another month is going to help and I worry about recurrent attacks since he has had repeated CAT scans over the past several years with recurrent flares.  I recommended colectomy.  Reasonable for robotic approach.  Hopefully should be able to do an immediate anastomosis but will mark just in case.  Given his urinary tension I worry he could have a developing colovesical fistula.  No strong  evidence but see if urology can do firefly at the start of the case to light the ureters and protect them. The anatomy & physiology of the digestive tract was discussed.  The pathophysiology of the colon was discussed.  Natural history risks without surgery was discussed.   I feel the risks of no intervention will lead to serious problems that outweigh the operative risks; therefore, I recommended a partial colectomy to remove the pathology.  Minimally invasive (Robotic/Laparoscopic) & open techniques were discussed.   Risks such as bleeding, infection, abscess, leak, reoperation, injury to other organs, need for repair of tissues / organs, possible ostomy, hernia, heart attack, stroke, death, and other risks were discussed.  I noted a good likelihood this will help address the problem.   Goals of post-operative recovery were discussed as well.   Need for adequate nutrition, daily bowel regimen and healthy physical activity, to optimize recovery was noted as well. We will work to minimize complications.  Educational materials were available as well.  Questions were answered.  The patient expresses understanding & wishes to proceed with surgery.  Since he is not obstructed see if we can switch to clear liquids and mechanical bowel prep with oral antibiotics today to minimize risk of infections and other concerns.  I usually just give 40mg  of enoxaparin  at the start of the case.  I do not think he is on constant heparin  but if he is need to hold IV drip for 6 hours preop.  -monitor electrolytes & replace as needed  Keep K>4, Mg>2, Phos>3  -VTE prophylaxis- SCDs.  Anticoagulation prophyllaxis SQ as appropriate  -mobilize as tolerated to help recovery.  Enlist therapies in moderate/high risk patients as appropriate  I updated the patient's status to the patient and his wife at bedside.  Recommendations were made.  Questions were answered.  They expressed understanding & appreciation.  -Disposition:  Probable surgery 1/6 versus 1/7 afternoon depending on robot availability Disposition:        I reviewed nursing notes, ED provider notes, hospitalist notes, last 24 h vitals and pain scores, last 48 h intake and output, last 24 h labs and trends, and last 24 h imaging results.  I have reviewed this patient's available data, including medical history, events of note, test results, etc as part of my evaluation.   A significant portion of that time was spent in counseling. Care during the described time interval was provided by me.  This care required moderate level of medical decision making.  06/05/2024    Subjective: (Chief complaint)  Patient with less soreness.  Started to have bowel movements.  Less pressure.  Tolerating soft diet.  Wife just came in to room.  Objective:  Vital signs:  Vitals:   06/04/24 0502 06/04/24 1423 06/04/24 2005 06/05/24 0514  BP: (!) 138/100 133/89 (!) 138/93 (!) 140/98  Pulse: 90 90 91 80  Resp: 14 16 16 16   Temp: 98.5 F (36.9 C) 98.1 F (36.7 C) 98 F (36.7 C) 98.1 F (36.7 C)  TempSrc: Oral Oral Oral Oral  SpO2: 96% 96% 94% 97%  Weight:      Height:        Last BM Date : 06/04/24  Intake/Output   Yesterday:  01/04 0701 - 01/05 0700 In: 1437.4 [P.O.:950; I.V.:383.2; IV Piggyback:104.2] Out: 1550 [Urine:1550] This shift:  No intake/output data recorded.  Bowel function:  Flatus: YES  BM:  YES  Drain: (No drain)   Physical Exam:  General: Pt awake/alert in no acute distress Eyes: PERRL, normal EOM.  Sclera clear.  No icterus Neuro: CN II-XII intact w/o focal sensory/motor deficits. Lymph: No head/neck/groin lymphadenopathy Psych:  No delerium/psychosis/paranoia.  Oriented x 4 HENT: Normocephalic, Mucus membranes moist.  No thrush Neck: Supple, No tracheal deviation.  No obvious thyromegaly Chest: No pain to chest wall compression.  Good respiratory excursion.  No audible wheezing CV:  Pulses intact.  Regular  rhythm.  No major extremity edema MS: Normal AROM mjr joints.  No obvious deformity  Abdomen: Soft.  Nondistended.  Tenderness at left lower quadrant/suprapubic region mild..  No evidence of peritonitis.  No incarcerated hernias.  Ext:   No deformity.  No mjr edema.  No cyanosis Skin: No petechiae / purpurea.  No major sores.  Warm and dry    Results:   Cultures: No results found for this or any previous visit (from the past 720 hours).  Labs: Results for orders placed or performed during the hospital encounter of 06/01/24 (from the past 48 hours)  Heparin  level (unfractionated)     Status: None   Collection Time: 06/03/24  8:15 AM  Result Value Ref Range   Heparin  Unfractionated 0.42 0.30 - 0.70 IU/mL    Comment: (NOTE) The clinical reportable range upper limit is being lowered to >1.10 to align with the FDA approved guidance for the current laboratory assay.  If heparin  results are below expected values, and patient dosage has  been confirmed, suggest follow up testing of antithrombin III  levels. Performed at [facility], [address] [place], [place] [address]  APTT     Status: Abnormal   Collection Time: 06/03/24  8:20 AM  Result Value Ref Range   aPTT 70 (H) 24 - 36 seconds    Comment:        IF BASELINE aPTT IS ELEVATED, SUGGEST PATIENT RISK ASSESSMENT BE USED TO DETERMINE APPROPRIATE ANTICOAGULANT THERAPY. Performed at [facility], [address] [place], [place] [address]   Heparin  level (unfractionated)     Status: Abnormal   Collection Time: 06/03/24  2:50 PM  Result Value Ref Range   Heparin  Unfractionated 0.20 (L) 0.30 - 0.70 IU/mL    Comment: (NOTE) The clinical reportable range upper limit is being lowered to >1.10 to align with the FDA approved guidance for the current laboratory assay.  If heparin  results are below expected values, and patient dosage has  been confirmed, suggest follow up testing of  antithrombin III  levels. Performed at [facility], [address] [place], [place] [address]   Heparin  level (unfractionated)     Status: None   Collection Time: 06/03/24  8:39 PM  Result Value Ref Range   Heparin  Unfractionated 0.41 0.30 - 0.70 IU/mL    Comment: (NOTE) The clinical reportable range upper limit is being lowered to >1.10 to align with the FDA approved guidance for the current laboratory assay.  If heparin  results are below expected values, and patient dosage has  been confirmed, suggest follow up testing of antithrombin III  levels. Performed at [facility], [address] [place], [place] [address]   CBC     Status: None   Collection Time: 06/04/24  4:10 AM  Result Value Ref Range   WBC 10.3 4.0 - 10.5 K/uL   RBC 4.64 4.22 - 5.81 MIL/uL   Hemoglobin 14.2 13.0 - 17.0 g/dL   HCT 58.1 60.9 - 47.9 %   MCV 90.1 80.0 - 100.0 fL   MCH 30.6 26.0 - 34.0 pg   MCHC 34.0 30.0 - 36.0 g/dL   RDW 87.3 88.4 - 84.4 %   Platelets 248 150 - 400 K/uL   nRBC 0.0 0.0 - 0.2 %    Comment: Performed at [facility], [address], [place], [place] [address]  Basic metabolic panel with GFR     Status: Abnormal   Collection Time: 06/04/24  4:10 AM  Result Value Ref Range   Sodium 135 135 - 145 mmol/L   Potassium 3.6 3.5 - 5.1 mmol/L   Chloride 101 98 - 111 mmol/L   CO2 20 (L) 22 - 32 mmol/L   Glucose, Bld 102 (H) 70 - 99 mg/dL    Comment: Glucose reference range applies only to samples taken after fasting for at least 8 hours.   BUN 8 6 - 20 mg/dL   Creatinine, Ser 8.95 0.61 - 1.24 mg/dL   Calcium  9.3 8.9 - 10.3 mg/dL   GFR, Estimated >39 >39 mL/min    Comment: (NOTE) Calculated using the CKD-EPI Creatinine Equation (2021)    Anion gap 15 5 - 15    Comment: Performed at [facility], [address], [place], [place] [address]  Magnesium      Status: Abnormal   Collection Time: 06/04/24  4:10 AM   Result Value Ref Range   Magnesium  2.5 (H) 1.7 - 2.4 mg/dL    Comment: Performed at [facility], [address], [place], [place] [address]  Heparin  level (unfractionated)     Status: None  Collection Time: 06/04/24  4:10 AM  Result Value Ref Range   Heparin  Unfractionated 0.30 0.30 - 0.70 IU/mL    Comment: (NOTE) The clinical reportable range upper limit is being lowered to >1.10 to align with the FDA approved guidance for the current laboratory assay.  If heparin  results are below expected values, and patient dosage has  been confirmed, suggest follow up testing of antithrombin III  levels. Performed at [facility], [address] [place], [place] [address]   Heparin  level (unfractionated)     Status: None   Collection Time: 06/04/24  2:05 PM  Result Value Ref Range   Heparin  Unfractionated 0.33 0.30 - 0.70 IU/mL    Comment: (NOTE) The clinical reportable range upper limit is being lowered to >1.10 to align with the FDA approved guidance for the current laboratory assay.  If heparin  results are below expected values, and patient dosage has  been confirmed, suggest follow up testing of antithrombin III  levels. Performed at [facility], [address], [place], [place] [address]   Heparin  level (unfractionated)     Status: None   Collection Time: 06/04/24  8:17 PM  Result Value Ref Range   Heparin  Unfractionated 0.35 0.30 - 0.70 IU/mL    Comment: (NOTE) The clinical reportable range upper limit is being lowered to >1.10 to align with the FDA approved guidance for the current laboratory assay.  If heparin  results are below expected values, and patient dosage has  been confirmed, suggest follow up testing of antithrombin III  levels. Performed at [facility], [address], [place], [place] [address]   CBC     Status: None   Collection Time: 06/05/24  4:47 AM  Result Value Ref Range   WBC  8.6 4.0 - 10.5 K/uL   RBC 4.58 4.22 - 5.81 MIL/uL   Hemoglobin 13.9 13.0 - 17.0 g/dL   HCT 58.4 60.9 - 47.9 %   MCV 90.6 80.0 - 100.0 fL   MCH 30.3 26.0 - 34.0 pg   MCHC 33.5 30.0 - 36.0 g/dL   RDW 87.3 88.4 - 84.4 %   Platelets 277 150 - 400 K/uL   nRBC 0.0 0.0 - 0.2 %    Comment: Performed at [facility], [address] [place], [place] [address]  Heparin  level (unfractionated)     Status: None   Collection Time: 06/05/24  4:47 AM  Result Value Ref Range   Heparin  Unfractionated 0.43 0.30 - 0.70 IU/mL    Comment: (NOTE) The clinical reportable range upper limit is being lowered to >1.10 to align with the FDA approved guidance for the current laboratory assay.  If heparin  results are below expected values, and patient dosage has  been confirmed, suggest follow up testing of antithrombin III  levels. Performed at [facility], [address], [place], [place] [address]   Basic metabolic panel     Status: Abnormal   Collection Time: 06/05/24  4:47 AM  Result Value Ref Range   Sodium 136 135 - 145 mmol/L   Potassium 3.7 3.5 - 5.1 mmol/L   Chloride 103 98 - 111 mmol/L   CO2 21 (L) 22 - 32 mmol/L   Glucose, Bld 94 70 - 99 mg/dL    Comment: Glucose reference range applies only to samples taken after fasting for at least 8 hours.   BUN 13 6 - 20 mg/dL   Creatinine, Ser 8.88 0.61 - 1.24 mg/dL   Calcium  9.1  8.9 - 10.3 mg/dL   GFR, Estimated >39 >39 mL/min    Comment: (NOTE) Calculated using the CKD-EPI Creatinine Equation (2021)    Anion gap 13 5 - 15    Comment: Performed at [facility], [address] [place], [place] [address]  Magnesium      Status: Abnormal   Collection Time: 06/05/24  4:47 AM  Result Value Ref Range   Magnesium  2.6 (H) 1.7 - 2.4 mg/dL    Comment: Performed at [facility], [address] [place], [place] [address]  Phosphorus     Status: None   Collection Time: 06/05/24  4:47  AM  Result Value Ref Range   Phosphorus 3.6 2.5 - 4.6 mg/dL    Comment: Performed at [facility], [address] [place], [place] [address]    Imaging / Studies: No results found.  Medications / Allergies: per chart  Antibiotics: Anti-infectives (From admission, onward)    Start     Dose/Rate Route Frequency Ordered Stop   06/05/24 1000  fluconazole  (DIFLUCAN ) tablet 200 mg        200 mg Oral Weekly 06/02/24 1355     06/02/24 1430  valACYclovir  (VALTREX ) tablet 500 mg        500 mg Oral Daily 06/02/24 1355     06/02/24 1000  piperacillin -tazobactam (ZOSYN ) IVPB 3.375 g        3.375 g 12.5 mL/hr over 240 Minutes Intravenous Every 8 hours 06/02/24 0951     06/01/24 1800  piperacillin -tazobactam (ZOSYN ) IVPB 3.375 g  Status:  Discontinued        3.375 g 12.5 mL/hr over 240 Minutes Intravenous Every 8 hours 06/01/24 1153 06/01/24 1621   06/01/24 1800  cefTRIAXone  (ROCEPHIN ) 2 g in sodium chloride  0.9 % 100 mL IVPB  Status:  Discontinued        2 g 200 mL/hr over 30 Minutes Intravenous Every 24 hours 06/01/24 1621 06/02/24 0944   06/01/24 1800  metroNIDAZOLE  (FLAGYL ) tablet 500 mg  Status:  Discontinued        500 mg Oral Every 12 hours 06/01/24 1621 06/02/24 0944   06/01/24 1200  piperacillin -tazobactam (ZOSYN ) IVPB 3.375 g        3.375 g 100 mL/hr over 30 Minutes Intravenous  Once 06/01/24 1151 06/01/24 1229         Note: Portions of this report may have been transcribed using voice recognition software. Every effort was made to ensure accuracy; however, inadvertent computerized transcription errors may be present.   Any transcriptional errors that result from this process are unintentional.    Elspeth KYM Schultze, MD, FACS, MASCRS Esophageal, Gastrointestinal & Colorectal Surgery Robotic and Minimally Invasive Surgery  [facility] [address] [place], [place] [address] [phone]  Fax [phone] Main  CONTACT INFORMATION: Weekday (9AM-5PM): Call [facility] main office at [phone] Weeknight (5PM-9AM) or Weekend/Holiday: Check EPIC Web Links tab & use AMION (password  [email or web address]) for General Surgery [facility] coverage  Please, DO NOT use SecureChat  (it is not reliable communication to reach operating surgeons & will lead to a delay in care).   Epic staff messaging available for outpatient concerns needing 1-2 business day response.      06/05/2024  7:47 AM

## 2024-06-06 ENCOUNTER — Inpatient Hospital Stay (HOSPITAL_COMMUNITY): Admitting: Anesthesiology

## 2024-06-06 ENCOUNTER — Encounter (HOSPITAL_COMMUNITY): Payer: Self-pay | Admitting: *Deleted

## 2024-06-06 ENCOUNTER — Encounter (HOSPITAL_COMMUNITY)
Admission: EM | Disposition: A | Discharge status: Home / Self Care | Payer: Self-pay | Source: Home / Self Care | Attending: Internal Medicine

## 2024-06-06 DIAGNOSIS — K5720 Diverticulitis of large intestine with perforation and abscess without bleeding: Secondary | ICD-10-CM | POA: Diagnosis present

## 2024-06-06 HISTORY — PX: CECOSTOMY: SHX1316

## 2024-06-06 HISTORY — PX: PROCTOSCOPY: SHX2266

## 2024-06-06 HISTORY — PX: COLECTOMY, SIGMOID, ROBOT-ASSISTED: SHX7542

## 2024-06-06 HISTORY — PX: CYSTOSCOPY WITH INDOCYANINE GREEN IMAGING (ICG): SHX7549

## 2024-06-06 LAB — CBC
HCT: 44.3 % (ref 39.0–52.0)
HCT: 42.5 % (ref 39.0–52.0)
Hemoglobin: 15.1 g/dL (ref 13.0–17.0)
Hemoglobin: 14.8 g/dL (ref 13.0–17.0)
MCH: 30.5 pg (ref 26.0–34.0)
MCH: 31 pg (ref 26.0–34.0)
MCHC: 34.1 g/dL (ref 30.0–36.0)
MCHC: 34.8 g/dL (ref 30.0–36.0)
MCV: 89.5 fL (ref 80.0–100.0)
MCV: 89.1 fL (ref 80.0–100.0)
Platelets: 320 K/uL (ref 150–400)
Platelets: 327 K/uL (ref 150–400)
RBC: 4.95 MIL/uL (ref 4.22–5.81)
RBC: 4.77 MIL/uL (ref 4.22–5.81)
RDW: 12.6 % (ref 11.5–15.5)
RDW: 12.4 % (ref 11.5–15.5)
WBC: 10 K/uL (ref 4.0–10.5)
WBC: 14.6 K/uL — ABNORMAL HIGH (ref 4.0–10.5)
nRBC: 0 % (ref 0.0–0.2)
nRBC: 0 % (ref 0.0–0.2)

## 2024-06-06 LAB — BASIC METABOLIC PANEL WITH GFR
Anion gap: 14 (ref 5–15)
BUN: 11 mg/dL (ref 6–20)
CO2: 20 mmol/L — ABNORMAL LOW (ref 22–32)
Calcium: 9.5 mg/dL (ref 8.9–10.3)
Chloride: 103 mmol/L (ref 98–111)
Creatinine, Ser: 0.96 mg/dL (ref 0.61–1.24)
GFR, Estimated: >60 mL/min
Glucose, Bld: 97 mg/dL (ref 70–99)
Potassium: 3.8 mmol/L (ref 3.5–5.1)
Sodium: 137 mmol/L (ref 135–145)

## 2024-06-06 LAB — MAGNESIUM: Magnesium: 2.5 mg/dL — ABNORMAL HIGH (ref 1.7–2.4)

## 2024-06-06 LAB — HEPARIN LEVEL (UNFRACTIONATED): Heparin Unfractionated: 0.32 [IU]/mL (ref 0.30–0.70)

## 2024-06-06 MED ORDER — MIDAZOLAM HCL 2 MG/2ML IJ SOLN
INTRAMUSCULAR | Status: AC
  Filled 2024-06-06: qty 2

## 2024-06-06 MED ORDER — MIDAZOLAM HCL 5 MG/5ML IJ SOLN
INTRAMUSCULAR | Status: DC | PRN
  Administered 2024-06-06: 2 mg via INTRAVENOUS

## 2024-06-06 MED ORDER — AMISULPRIDE (ANTIEMETIC) 5 MG/2ML IV SOLN: 10.0000 mg | Freq: Once | INTRAVENOUS | Status: DC | PRN

## 2024-06-06 MED ORDER — BUPIVACAINE-EPINEPHRINE (PF) 0.25% -1:200000 IJ SOLN
INTRAMUSCULAR | Status: DC | PRN
  Administered 2024-06-06: 60 mL

## 2024-06-06 MED ORDER — ROCURONIUM BROMIDE 10 MG/ML (PF) SYRINGE
PREFILLED_SYRINGE | INTRAVENOUS | Status: AC
  Filled 2024-06-06: qty 10

## 2024-06-06 MED ORDER — SUGAMMADEX SODIUM 200 MG/2ML IV SOLN
INTRAVENOUS | Status: DC | PRN
  Administered 2024-06-06: 200 mg via INTRAVENOUS

## 2024-06-06 MED ORDER — DEXAMETHASONE SOD PHOSPHATE PF 10 MG/ML IJ SOLN
INTRAMUSCULAR | Status: DC | PRN
  Administered 2024-06-06: 10 mg via INTRAVENOUS

## 2024-06-06 MED ORDER — EPHEDRINE 5 MG/ML INJ
INTRAVENOUS | Status: AC
  Filled 2024-06-06: qty 5

## 2024-06-06 MED ORDER — HYDROMORPHONE HCL 1 MG/ML IJ SOLN
0.5000 mg | INTRAMUSCULAR | Status: DC | PRN
  Administered 2024-06-06 – 2024-06-09 (×13): 1 mg via INTRAVENOUS
  Filled 2024-06-06 (×13): qty 1

## 2024-06-06 MED ORDER — EPHEDRINE SULFATE-NACL 50-0.9 MG/10ML-% IV SOSY
PREFILLED_SYRINGE | INTRAVENOUS | Status: DC | PRN
  Administered 2024-06-06 (×4): 5 mg via INTRAVENOUS

## 2024-06-06 MED ORDER — ALVIMOPAN 12 MG PO CAPS
12.0000 mg | ORAL_CAPSULE | Freq: Two times a day (BID) | ORAL | Status: DC
  Administered 2024-06-07 – 2024-06-08 (×4): 12 mg via ORAL
  Filled 2024-06-06 (×5): qty 1

## 2024-06-06 MED ORDER — KETAMINE HCL 50 MG/5ML IJ SOSY
PREFILLED_SYRINGE | INTRAMUSCULAR | Status: AC
  Filled 2024-06-06: qty 5

## 2024-06-06 MED ORDER — STERILE WATER FOR INJECTION IJ SOLN
INTRAMUSCULAR | Status: DC | PRN
  Administered 2024-06-06: 20 mL

## 2024-06-06 MED ORDER — ACETAMINOPHEN 10 MG/ML IV SOLN: 1000.0000 mg | Freq: Once | INTRAVENOUS | Status: DC | PRN

## 2024-06-06 MED ORDER — FENTANYL CITRATE (PF) 100 MCG/2ML IJ SOLN
INTRAMUSCULAR | Status: AC
  Filled 2024-06-06: qty 2

## 2024-06-06 MED ORDER — DIPHENHYDRAMINE HCL 50 MG/ML IJ SOLN: 12.5000 mg | Freq: Four times a day (QID) | INTRAMUSCULAR | Status: DC | PRN

## 2024-06-06 MED ORDER — KETAMINE HCL 50 MG/5ML IJ SOSY
PREFILLED_SYRINGE | INTRAMUSCULAR | Status: DC | PRN
  Administered 2024-06-06: 20 mg via INTRAVENOUS
  Administered 2024-06-06 (×3): 10 mg via INTRAVENOUS

## 2024-06-06 MED ORDER — DIPHENHYDRAMINE HCL 12.5 MG/5ML PO ELIX: 12.5000 mg | ORAL_SOLUTION | Freq: Four times a day (QID) | ORAL | Status: DC | PRN

## 2024-06-06 MED ORDER — LIDOCAINE HCL 2 % IJ SOLN
INTRAMUSCULAR | Status: AC
  Filled 2024-06-06: qty 20

## 2024-06-06 MED ORDER — SPY AGENT GREEN - (INDOCYANINE FOR INJECTION)
INTRAMUSCULAR | Status: DC | PRN
  Administered 2024-06-06: 2 mL via INTRAVENOUS

## 2024-06-06 MED ORDER — OXYCODONE HCL 5 MG/5ML PO SOLN: 5.0000 mg | Freq: Once | ORAL | Status: DC | PRN

## 2024-06-06 MED ORDER — ONDANSETRON HCL 4 MG/2ML IJ SOLN
INTRAMUSCULAR | Status: DC | PRN
  Administered 2024-06-06: 4 mg via INTRAVENOUS

## 2024-06-06 MED ORDER — LIDOCAINE HCL (PF) 2 % IJ SOLN
INTRAMUSCULAR | Status: AC
  Filled 2024-06-06: qty 5

## 2024-06-06 MED ORDER — PHENYLEPHRINE HCL-NACL 20-0.9 MG/250ML-% IV SOLN
INTRAVENOUS | Status: DC | PRN
  Administered 2024-06-06: 40 ug/min via INTRAVENOUS

## 2024-06-06 MED ORDER — HYDROMORPHONE HCL 1 MG/ML IJ SOLN
INTRAMUSCULAR | Status: AC
  Filled 2024-06-06: qty 1

## 2024-06-06 MED ORDER — PHENYLEPHRINE 80 MCG/ML (10ML) SYRINGE FOR IV PUSH (FOR BLOOD PRESSURE SUPPORT)
PREFILLED_SYRINGE | INTRAVENOUS | Status: AC
  Filled 2024-06-06: qty 10

## 2024-06-06 MED ORDER — BENZONATATE 100 MG PO CAPS
100.0000 mg | ORAL_CAPSULE | Freq: Three times a day (TID) | ORAL | Status: DC | PRN
  Administered 2024-06-06 – 2024-06-07 (×3): 100 mg via ORAL
  Filled 2024-06-06 (×4): qty 1

## 2024-06-06 MED ORDER — STERILE WATER FOR INJECTION IJ SOLN
INTRAMUSCULAR | Status: AC
  Filled 2024-06-06: qty 10

## 2024-06-06 MED ORDER — MELATONIN 3 MG PO TABS: 3.0000 mg | ORAL_TABLET | Freq: Every evening | ORAL | Status: DC | PRN

## 2024-06-06 MED ORDER — LACTATED RINGERS IV SOLN: INTRAVENOUS | Status: DC | PRN

## 2024-06-06 MED ORDER — LACTATED RINGERS IR SOLN
Status: DC | PRN
  Administered 2024-06-06: 2000 mL

## 2024-06-06 MED ORDER — PHENYLEPHRINE HCL (PRESSORS) 10 MG/ML IV SOLN
INTRAVENOUS | Status: DC | PRN
  Administered 2024-06-06 (×3): 160 ug via INTRAVENOUS
  Administered 2024-06-06 (×2): 80 ug via INTRAVENOUS
  Administered 2024-06-06: 160 ug via INTRAVENOUS

## 2024-06-06 MED ORDER — SODIUM CHLORIDE 0.9 % IV SOLN: Freq: Three times a day (TID) | INTRAVENOUS | Status: DC | PRN

## 2024-06-06 MED ORDER — KCL IN DEXTROSE-NACL 20-5-0.45 MEQ/L-%-% IV SOLN
INTRAVENOUS | Status: DC
  Filled 2024-06-06: qty 1000

## 2024-06-06 MED ORDER — PROPOFOL 10 MG/ML IV BOLUS
INTRAVENOUS | Status: AC
  Filled 2024-06-06: qty 20

## 2024-06-06 MED ORDER — CHLORHEXIDINE GLUCONATE 0.12 % MT SOLN
15.0000 mL | Freq: Once | OROMUCOSAL | Status: AC
  Administered 2024-06-06: 15 mL via OROMUCOSAL

## 2024-06-06 MED ORDER — LACTATED RINGERS IV SOLN: INTRAVENOUS | Status: DC

## 2024-06-06 MED ORDER — SODIUM CHLORIDE (PF) 0.9 % IJ SOLN
INTRAMUSCULAR | Status: AC
  Filled 2024-06-06: qty 20

## 2024-06-06 MED ORDER — SCOPOLAMINE 1 MG/3DAYS TD PT72
1.0000 | MEDICATED_PATCH | TRANSDERMAL | Status: DC
  Administered 2024-06-06: 1 mg via TRANSDERMAL
  Filled 2024-06-06 (×2): qty 1

## 2024-06-06 MED ORDER — ONDANSETRON HCL 4 MG/2ML IJ SOLN
INTRAMUSCULAR | Status: AC
  Filled 2024-06-06: qty 2

## 2024-06-06 MED ORDER — FENTANYL CITRATE (PF) 100 MCG/2ML IJ SOLN
INTRAMUSCULAR | Status: DC | PRN
  Administered 2024-06-06 (×2): 50 ug via INTRAVENOUS
  Administered 2024-06-06: 100 ug via INTRAVENOUS

## 2024-06-06 MED ORDER — SIMETHICONE 80 MG PO CHEW
40.0000 mg | CHEWABLE_TABLET | Freq: Four times a day (QID) | ORAL | Status: DC | PRN
  Administered 2024-06-06 – 2024-06-08 (×4): 40 mg via ORAL
  Filled 2024-06-06 (×4): qty 1

## 2024-06-06 MED ORDER — PROPOFOL 10 MG/ML IV BOLUS
INTRAVENOUS | Status: DC | PRN
  Administered 2024-06-06: 200 mg via INTRAVENOUS

## 2024-06-06 MED ORDER — SUGAMMADEX SODIUM 200 MG/2ML IV SOLN
INTRAVENOUS | Status: AC
  Filled 2024-06-06: qty 2

## 2024-06-06 MED ORDER — GABAPENTIN 100 MG PO CAPS
200.0000 mg | ORAL_CAPSULE | Freq: Every day | ORAL | Status: DC
  Administered 2024-06-06: 200 mg via ORAL
  Filled 2024-06-06: qty 2

## 2024-06-06 MED ORDER — ENSURE SURGERY PO LIQD
237.0000 mL | Freq: Two times a day (BID) | ORAL | Status: DC
  Administered 2024-06-07 – 2024-06-08 (×2): 237 mL via ORAL

## 2024-06-06 MED ORDER — ONDANSETRON HCL 4 MG/2ML IJ SOLN: 4.0000 mg | Freq: Once | INTRAMUSCULAR | Status: DC | PRN

## 2024-06-06 MED ORDER — HYDROMORPHONE HCL 1 MG/ML IJ SOLN
0.2500 mg | INTRAMUSCULAR | Status: DC | PRN
  Administered 2024-06-06: 0.5 mg via INTRAVENOUS

## 2024-06-06 MED ORDER — SODIUM CHLORIDE (PF) 0.9 % IJ SOLN
INTRAMUSCULAR | Status: AC
  Filled 2024-06-06: qty 10

## 2024-06-06 MED ORDER — ALUM & MAG HYDROXIDE-SIMETH 200-200-20 MG/5ML PO SUSP: 30.0000 mL | Freq: Four times a day (QID) | ORAL | Status: DC | PRN

## 2024-06-06 MED ORDER — BUPIVACAINE-EPINEPHRINE (PF) 0.25% -1:200000 IJ SOLN
INTRAMUSCULAR | Status: AC
  Filled 2024-06-06: qty 60

## 2024-06-06 MED ORDER — OXYCODONE HCL 5 MG PO TABS: 5.0000 mg | ORAL_TABLET | Freq: Once | ORAL | Status: DC | PRN

## 2024-06-06 MED ORDER — LIDOCAINE HCL (PF) 2 % IJ SOLN
INTRAMUSCULAR | Status: DC | PRN
  Administered 2024-06-06: 1.5 mg/kg/h via INTRADERMAL
  Administered 2024-06-06: 80 mg via INTRADERMAL

## 2024-06-06 MED ORDER — 0.9 % SODIUM CHLORIDE (POUR BTL) OPTIME
TOPICAL | Status: DC | PRN
  Administered 2024-06-06: 2000 mL

## 2024-06-06 MED ORDER — ROCURONIUM BROMIDE 10 MG/ML (PF) SYRINGE
PREFILLED_SYRINGE | INTRAVENOUS | Status: DC | PRN
  Administered 2024-06-06: 20 mg via INTRAVENOUS
  Administered 2024-06-06: 80 mg via INTRAVENOUS
  Administered 2024-06-06 (×2): 20 mg via INTRAVENOUS

## 2024-06-06 NOTE — Op Note (Addendum)
 06/06/2024  4:53 PM  PATIENT:  Benjamin [name]  54 y.o. male  Patient Care Team: Gretta Ozell LITTIE [name] as PCP - General (Urgent Care) [name], MD as Consulting Physician (Ophthalmology) Buck Saucer, MD as Consulting Physician (Neurology) Ines Onetha NOVAK, MD as Consulting Physician (Neurology) Timmy Maude SAUNDERS, MD as Consulting Physician (Oncology) Franchot Lauraine HERO, NP as Nurse Practitioner (Hematology and Oncology) Sheldon Standing, MD as Consulting Physician (General Surgery) Shila Gustav GAILS, MD as Consulting Physician (Gastroenterology)  PRE-OPERATIVE DIAGNOSIS: SIGMOID DIVERTICULITIS WITH CHRONIC INTRAMURAL ABSCESS  POST-OPERATIVE DIAGNOSIS:  SIGMOID DIVERTICULITIS WITH CHRONIC ABSCESS  PROCEDURE:   -ROBOTIC LOW ANTERIOR RECTOSIGMOID RESECTION (LAR) WITH ANASTOMOSIS -INTRAOPERATIVE ASSESSMENT OF TISSUE VASCULAR PERFUSION USING ICG (indocyanine green ) IMMUNOFLUORESCENCE,  -TRANSVERSUS ABDOMINIS PLANE (TAP) BLOCK - BILATERAL -FLEXIBLE SIGMOIDOSCOPY  SURGEON:  Standing [name], MD  ASSISTANT:  Camellia Blush, MD, FACS CANDIE Ronnald Nick, PA-S, [facility]  An experienced assistant was required given the standard of surgical care given the complexity of the case.  This assistant was needed for exposure, dissection, suction, tissue approximation, retraction, perception, etc  ANESTHESIA:  General endotracheal intubation anesthesia (GETA) and Regional TRANSVERSUS ABDOMINIS PLANE (TAP) nerve block -BILATERAL for perioperative & postoperative pain control at the level of the transverse abdominis & preperitoneal spaces along the flank at the anterior axillary line, from subcostal ridge to iliac crest under laparoscopic guidance provided with 60 mL of bupivicaine 0.25% with epinephrine   Estimated Blood Loss (EBL):   Total I/O In: 1800 [I.V.:1800] Out: 275 [Urine:200; Blood:75].   (See anesthesia record)  Delay start of Pharmacological VTE agent (>24hrs) due to concerns of  significant anemia, surgical blood loss, or risk of bleeding?:  no  DRAINS: (None) and 19 Fr Blake drain the tip resting in the pelvis  SPECIMEN:  Rectosigmoid (open end proximal) and Distal anastomotic ring (FINAL DISTAL MARGIN)  DISPOSITION OF SPECIMEN:  Pathology  COUNTS:  Sponge, needle, & instrument counts CORRECT at the conclusion of the case.      PLAN OF CARE: Admit to inpatient   PATIENT DISPOSITION:  PACU - hemodynamically stable.  INDICATION:    54 year old male with current episodes of diverticulitis.  Followed by Gastroenterology.  Has had this repeated attacks including hospitalizations.  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  Admitted and withheld and off antibiotics.  Because of persistent recurrent episodes, I recommended segmental resection:  The anatomy & physiology of the digestive tract was discussed.  The pathophysiology was discussed.  Natural history risks without surgery was discussed.   I worked to give an overview of the disease and the frequent need to have multispecialty involvement.  I feel the risks of no intervention will lead to serious problems that outweigh the operative risks; therefore, I recommended a partial colectomy to remove the pathology.  Laparoscopic & open techniques were discussed.   Risks such as bleeding, infection, abscess, leak, reoperation, possible ostomy, hernia, heart attack, death, and other risks were discussed.  I noted a good likelihood this will help address the problem.   Goals of post-operative recovery were discussed as well.  We will work to minimize complications.  Educational materials on the pathology had been given in the office.  Questions were answered.    The patient expressed understanding & wished to proceed with surgery.  OR FINDINGS:   Patient had very inflamed sigmoid colon densely adherent to left lower quadrant pelvis and especially the anterior  pelvis/bladder wall.  Contained abscess  with some chronic phlegmon noted in the anterior peritoneal reflection overlying the distal sigmoid and proximal rectum.  No obvious metastatic disease on visceral parietal peritoneum or liver.  It is a 29mm EEA anastomosis ( distal descending colon  connected to proximal rectum.)  It rests 14 cm from the anal verge by flexible sigmoidoscopy  CASE DATA: Type of patient?: LDOW CASE (Surgical Hospitalist [facility] Inpatient) Status of Case? URGENT Add On Infection Present At Time Of Surgery (PATOS)?  ABSCESS   DESCRIPTION:   Informed consent was confirmed.  The patient underwent general anaesthesia without difficulty.  The patient was positioned appropriately.  VTE prevention in place.  Patient underwent cystoscopy with injection of ICG firefly by Dr. Renda with [facility].  He did not see any evidence of any ureteral major abnormalities nor any colovesical fistula.  Please see his separate operative report.  The patient was clipped, prepped, & draped in a sterile fashion.  Surgical timeout confirmed our plan.  The patient was positioned in reverse Trendelenburg.  Abdominal entry was gained using Varess technique at the left subcostal ridge on the anterior abdominal wall.  No elevated EtCO2 noted.  Port placed.  Camera inspection revealed no injury.  Extra ports were carefully placed under direct laparoscopic visualization.  Upon entering the abdomen (organ space),we encountered an abscess in the anterior pelvis.   I reflected the greater omentum and the upper abdomen the small bowel in the upper abdomen.  The patient was carefully positioned.  The Intuitive daVinci robot was docked with camera & instruments carefully placed.  The patient had had significant phlegmonous thickening of most of the sigmoid colon.  I tried to breach between the bladder and the I mobilized the rectosigmoid colon & elevated it to put the main pedicle on tension.  I scored the base  of peritoneum of the medial side of the mesentery of the elevated left colon from the ligament of Treitz to the mid rectum.   I elevated the sigmoid mesentery and entered into the retro-mesenteric plane. We were able to identify the left ureter and gonadal vessels. We kept those posterior within the retroperitoneum and elevated the left colon mesentery off that. I did isolate the inferior mesenteric artery (IMA) pedicle but did not ligate it yet.  I continued distally and got into the avascular plane posterior to the mesorectum, sparing the nervi ergentes.. This allowed me to help mobilize the rectum as well by freeing the mesorectum off the sacrum.  I stayed away from the right and left ureters.  I kept the lateral vascular pedicles to the rectum intact.  I skeletonized the lymph nodes off the inferior mesenteric artery pedicle.  I went down to its takeoff from the aorta.  I isolated the inferior mesenteric vein off of the ligament of Treitz just cephalad to that as well.  After confirming the left ureter was out of the way, I went ahead and ligated the inferior mesenteric artery pedicle just near its takeoff from the aorta.  I did ligate the inferior mesenteric vein in a similar fashion.  We ensured hemostasis.  I continued medial to lateral dissection to free the left colon mesentery off the retroperitoneum going up towards the splenic flexure to allow good mobility and protect the colon mesentery.  I mobilized the left colon in a lateral to medial fashion off the retroperitoneum and sidewall attachments along the line of Toldt up towards the splenic flexure to ensure good mobilization of the remaining left colon to  reach into the pelvis.   We then focused on mesorectal dissection.  Freed the mesorectum off the presacral plane until I was distal to the concerning region.  Freed off peritoneum on the lateral sidewalls as well and transected the mesentery of the lateral pedicles to get distal to the area of  concern.  Came around anteriorly such that I had good circumferential mesorectal excision and a good margin distal to the area of concern.  Patient had a thick phlegmonous rind of peritoneum near the peritoneal breach.  I ended up coming down to the proximal/mid rectal junction to an area of the anterior rectal wall that was much more soft and reach better.  I scrubbed out and used the EEA sizers to confirm that a large 33 blood EEA sizer would easily come up to the proximal rectum.  He had obvious stricturing at the rectosigmoid which most likely explains some of his partial obstructive symptoms.  I chose a region at the descending colon that was soft and easily reached down to the rectal stump.  I skeletonized the mesorectum at the proximal/mid rectal junction.  We then chose a healthy region for the proximal margin that would reach well for our planned anastomosis (distal descending colon).  Transected the colon mesentery radially to preserve good collateral and marginal artery blood supply.  Then transected at the distal margin with a 60mm green load robotic stapler.  90% across first firing and 1 more staple from the left lateral corner  We created an extraction incision through a small Pfannenstiel incision in the suprapubic region.  Placed a wound protector.  I was able to eviscerate the rectosigmoid and descending colon out the wound.   I clamped the colon proximal to this area using a reusable pursestringer device.  Passed a 2-0 Keith needle. I transected at the descending/sigmoid junction with a scalpel. I got healthy bleeding mucosa.  We sent the rectosigmoid colon specimen off to go to pathology.  We sized the colon orifice.  I chose a 29mm EEA anvil stapler system.  I reinforced the prolene pursestring with interrupted silk belt loop sutures.  I placed the anvil to the open end of the proximal remaining colon and closed around it using the pursestring.    We did copious irrigation with crystalloid  solution.  Hemostasis was good.  The distal end of the remaining colon easily reached down to the rectal stump, therefore, splenic flexure mobilization was not needed.      I scrubbed down and did gentle anal dilation and advanced the EEA stapler up the rectal stump. The spike was brought out at the provimal end of the rectal stump under direct visualization.  Dr Tanda attached the anvil of the proximal colon the spike of the stapler. Anvil was tightened down and held clamped for 60 seconds.  Orientation was confirmed such that there is no twisting of the colon nor small bowel underneath the mesenteric defect. No concerning tension.  The EEA stapler was fired and held clamped for 30 seconds. The stapler was released & removed. Blue stitch is in the proximal ring.  Care was taken to ensure no other structures were incorporated within this either.  We noted 2 excellent anastomotic rings.   The colon proximal to the anastomosis was then gently occluded. The pelvis was filled with sterile irrigation.  I did flexible sigmoidoscopy.  Noted the anastomosis was at 14 cm from the anal verge consistent with the proximal rectum.  Intact anastomosis without active  bleeding.  No mucosal ischemia.  There was a negative air leak test. There was no tension of mesentery or bowel at the anastomosis.   Tissues looked viable.  Ureters & bowel uninjured.  The anastomosis looked healthy.  We did extra irrigation.  Given the abscess in the pelvis despite being cleared up we decided to place a drain to be safe.  Greater omentum positioned down into the pelvis to help protect the anastomosis.  Endoluminal gas was evacuated.  Ports & wound protector removed.  We changed gloves & redraped the patient per colon SSI prevention protocol.  We aspirated the sterile irrigation.  Hemostasis was good.  Sterile unused instruments were used from this point.  I closed the skin at the port sites using Monocryl stitch and sterile dressing.  We  assured hemostasis and the former ostomy wound.  Wound irrigated.  I closed the posterior rectus fascia with 0 Vicryl suture.  Anterior rectus fascia was closed using #1 PDS transversely.   Sterile dressing placed.   Patient is being extubated go to recovery room. I had discussed postop care with the patient in detail the office & in the holding area. Instructions are written. I discussed operative findings, updated the patient's status, discussed probable steps to recovery, and gave postoperative recommendations to the patient's spouse, Whitley Strycharz.  Recommendations were made.  Questions were answered.  She expressed understanding & appreciation.  Elspeth KYM Schultze, M.D., F.A.C.S. Gastrointestinal and Minimally Invasive Surgery [facility] [address], [address] [place], [place] [address] [phone] Main / Paging

## 2024-06-06 NOTE — Discharge Instructions (Signed)
 SURGERY: POST OP INSTRUCTIONS (Surgery for small bowel obstruction, colon resection, etc)   ######################################################################  EAT Gradually transition to a high fiber diet with a fiber supplement over the next few days after discharge  WALK Walk an hour a day.  Control your pain to do that.    CONTROL PAIN Control pain so that you can walk, sleep, tolerate sneezing/coughing, go up/down stairs.  HAVE A BOWEL MOVEMENT DAILY Keep your bowels regular to avoid problems.  OK to try a laxative to override constipation.  OK to use an antidairrheal to slow down diarrhea.  Call if not better after 2 tries  CALL IF YOU HAVE PROBLEMS/CONCERNS Call if you are still struggling despite following these instructions. Call if you have concerns not answered by these instructions  ######################################################################   DIET Follow a light diet the first few days at home.  Start with a bland diet such as soups, liquids, starchy foods, low fat foods, etc.  If you feel full, bloated, or constipated, stay on a ful liquid or pureed/blenderized diet for a few days until you feel better and no longer constipated. Be sure to drink plenty of fluids every day to avoid getting dehydrated (feeling dizzy, not urinating, etc.). Gradually add a fiber supplement to your diet over the next week.  Gradually get back to a regular solid diet.  Avoid fast food or heavy meals the first week as you are more likely to get nauseated. It is expected for your digestive tract to need a few months to get back to normal.  It is common for your bowel movements and stools to be irregular.  You will have occasional bloating and cramping that should eventually fade away.  Until you are eating solid food normally, off all pain medications, and back to regular activities; your bowels will not be normal. Focus on eating a low-fat, high fiber diet the rest of your life  (See Getting to Good Bowel Health, below).  CARE of your INCISION or WOUND  It is good for closed incisions and even open wounds to be washed every day.  Shower every day.  Short baths are fine.  Wash the incisions and wounds clean with soap & water .    You may leave closed incisions open to air if it is dry.   You may cover the incision with clean gauze & replace it after your daily shower for comfort.  TEGADERM:  You have clear gauze band-aid dressings over your closed incision(s).  Remove the dressings 2 days after surgery = 1/8.    If you have an open wound with a wound vac, see wound vac care instructions.    ACTIVITIES as tolerated Start light daily activities --- self-care, walking, climbing stairs-- beginning the day after surgery.  Gradually increase activities as tolerated.  Control your pain to be active.  Stop when you are tired.  Ideally, walk several times a day, eventually an hour a day.   Most people are back to most day-to-day activities in a few weeks.  It takes 4-8 weeks to get back to unrestricted, intense activity. If you can walk 30 minutes without difficulty, it is safe to try more intense activity such as jogging, treadmill, bicycling, low-impact aerobics, swimming, etc. Save the most intensive and strenuous activity for last (Usually 4-8 weeks after surgery) such as sit-ups, heavy lifting, contact sports, etc.  Refrain from any intense heavy lifting or straining until you are off narcotics for pain control.  You will have off  days, but things should improve week-by-week. DO NOT PUSH THROUGH PAIN.  Let pain be your guide: If it hurts to do something, don't do it.  Pain is your body warning you to avoid that activity for another week until the pain goes down. You may drive when you are no longer taking narcotic prescription pain medication, you can comfortably wear a seatbelt, and you can safely make sudden turns/stops to protect yourself without hesitating due to  pain. You may have sexual intercourse when it is comfortable. If it hurts to do something, stop.   MEDICATIONS Take your usually prescribed home medications unless otherwise directed.    Blood thinners:  You can restart any strong blood thinners after the second postoperative day  for example: COUMADIN (warfarin), XERELTO (rivaroxaban ), ELIQUIS (apixaban), PLAVIX (clopidigrel), BRILINTA (ticagrelor), EFFIENT (prasugrel), PRADAXA (dabigatran), etc  Continue aspirin before & after surgery..     Some oozing/bleeding the first 1-2 weeks is common but should taper down & be small volume.    If you are passing many large clots or having uncontrolling bleeding, call your surgeon    PAIN CONTROL Pain after surgery or related to activity is often due to strain/injury to muscle, tendon, nerves and/or incisions.  This pain is usually short-term and will improve in a few months.  To help speed the process of healing and to get back to regular activity more quickly, DO THE FOLLOWING THINGS TOGETHER: Increase activity gradually.  DO NOT PUSH THROUGH PAIN Use Ice and/or Heat Try Gentle Massage and/or Stretching Take over the counter pain medication Take Narcotic prescription pain medication for more severe pain  Good pain control = faster recovery.  It is better to take more medicine to be more active than to stay in bed all day to avoid medications.  Increase activity gradually Avoid heavy lifting at first, then increase to lifting as tolerated over the next 6 weeks. Do not push through the pain.  Listen to your body and avoid positions and maneuvers than reproduce the pain.  Wait a few days before trying something more intense Walking an hour a day is encouraged to help your body recover faster and more safely.  Start slowly and stop when getting sore.  If you can walk 30 minutes without stopping or pain, you can try more intense activity (running, jogging, aerobics, cycling, swimming,  treadmill, sex, sports, weightlifting, etc.) Remember: If it hurts to do it, then dont do it! Use Ice and/or Heat You will have swelling and bruising around the incisions.  This will take several weeks to resolve. Ice packs or heating pads (6-8 times a day, 30-60 minutes at a time) will help sooth soreness & bruising. Some people prefer to use ice alone, heat alone, or alternate between ice & heat.  Experiment and see what works best for you.  Consider trying ice for the first few days to help decrease swelling and bruising; then, switch to heat to help relax sore spots and speed recovery. Shower every day.  Short baths are fine.  It feels good!  Keep the incisions and wounds clean with soap & water .   Try Gentle Massage and/or Stretching Massage at the area of pain many times a day Stop if you feel pain - do not overdo it Take over the counter pain medication This helps the muscle and nerve tissues become less irritable and calm down faster Choose ONE of the following over-the-counter anti-inflammatory medications: Acetaminophen  500mg  tabs (Tylenol ) 1-2 pills with every meal  and just before bedtime (avoid if you have liver problems or if you have acetaminophen  in you narcotic prescription) Naproxen  220mg  tabs (ex. Aleve , Naprosyn ) 1-2 pills twice a day (avoid if you have kidney, stomach, IBD, or bleeding problems) Ibuprofen 200mg  tabs (ex. Advil, Motrin) 3-4 pills with every meal and just before bedtime (avoid if you have kidney, stomach, IBD, or bleeding problems) Take with food/snack several times a day as directed for at least 2 weeks to help keep pain / soreness down & more manageable. Take Narcotic prescription pain medication for more severe pain A prescription for strong pain control is often given to you upon discharge (for example: oxycodone /Percocet, hydrocodone /Norco/Vicodin, or tramadol /Ultram ) Take your pain medication as prescribed. Be mindful that most narcotic prescriptions  contain Tylenol  (acetaminophen ) as well - avoid taking too much Tylenol . If you are having problems/concerns with the prescription medicine (does not control pain, nausea, vomiting, rash, itching, etc.), please call us  [phone] to see if we need to switch you to a different pain medicine that will work better for you and/or control your side effects better. If you need a refill on your pain medication, you must call the office before 4 pm and on weekdays only.  By federal law, prescriptions for narcotics cannot be called into a pharmacy.  They must be filled out on paper & picked up from our office by the patient or authorized caretaker.  Prescriptions cannot be filled after 4 pm nor on weekends.    WHEN TO CALL US  [phone] Severe uncontrolled or worsening pain  Fever over 101 F (38.5 C) Concerns with the incision: Worsening pain, redness, rash/hives, swelling, bleeding, or drainage Reactions / problems with new medications (itching, rash, hives, nausea, etc.) Nausea and/or vomiting Difficulty urinating Difficulty breathing Worsening fatigue, dizziness, lightheadedness, blurred vision Other concerns If you are not getting better after two weeks or are noticing you are getting worse, contact our office [phone] for further advice.  We may need to adjust your medications, re-evaluate you in the office, send you to the emergency room, or see what other things we can do to help. The clinic staff is available to answer your questions during regular business hours (8:30am-5pm).  Please dont hesitate to call and ask to speak to one of our nurses for clinical concerns.    A surgeon from [facility] is always on call at the hospitals 24 hours/day If you have a medical emergency, go to the nearest emergency room or call 911.  FOLLOW UP in our office One the day of your discharge from the hospital (or the next business weekday), please call [facility] to set up  or confirm an appointment to see your surgeon in the office for a follow-up appointment.  Usually it is 2-3 weeks after your surgery.   If you have skin staples at your incision(s), let the office know so we can set up a time in the office for the nurse to remove them (usually around 10 days after surgery). Make sure that you call for appointments the day of discharge (or the next business weekday) from the hospital to ensure a convenient appointment time. IF YOU HAVE DISABILITY OR FAMILY LEAVE FORMS, BRING THEM TO THE OFFICE FOR PROCESSING.  DO NOT GIVE THEM TO YOUR DOCTOR.  [facility] [address], [address], [place], [place]  [address] ? [phone] - Main [phone] - Toll Free,  [phone] - Fax [email or web address]  GETTING TO GOOD BOWEL HEALTH. It is expected for your digestive tract to need a few months to get back to normal.  It is common for your bowel movements and stools to be irregular.  You will have occasional bloating and cramping that should eventually fade away.  Until you are eating solid food normally, off all pain medications, and back to regular activities; your bowels will not be normal.   Avoiding constipation The goal: ONE SOFT BOWEL MOVEMENT A DAY!    Drink plenty of fluids.  Choose water  first. TAKE A FIBER SUPPLEMENT EVERY DAY THE REST OF YOUR LIFE During your first week back home, gradually add back a fiber supplement every day Experiment which form you can tolerate.   There are many forms such as powders, tablets, wafers, gummies, etc Psyllium bran (Metamucil), methylcellulose (Citrucel), Miralax  or Glycolax , Benefiber, Flax Seed.  Adjust the dose week-by-week (1/2 dose/day to 6 doses a day) until you are moving your bowels 1-2 times a day.  Cut back the dose or try a different fiber product if it is giving you problems such as diarrhea or bloating. Sometimes a laxative is needed to help jump-start bowels if  constipated until the fiber supplement can help regulate your bowels.  If you are tolerating eating & you are farting, it is okay to try a gentle laxative such as double dose MiraLax , prune juice, or Milk of Magnesia.  Avoid using laxatives too often. Stool softeners can sometimes help counteract the constipating effects of narcotic pain medicines.  It can also cause diarrhea, so avoid using for too long. If you are still constipated despite taking fiber daily, eating solids, and a few doses of laxatives, call our office. Controlling diarrhea Try drinking liquids and eating bland foods for a few days to avoid stressing your intestines further. Avoid dairy products (especially milk & ice cream) for a short time.  The intestines often can lose the ability to digest lactose when stressed. Avoid foods that cause gassiness or bloating.  Typical foods include beans and other legumes, cabbage, broccoli, and dairy foods.  Avoid greasy, spicy, fast foods.  Every person has some sensitivity to other foods, so listen to your body and avoid those foods that trigger problems for you. Probiotics (such as active yogurt, Align, etc) may help repopulate the intestines and colon with normal bacteria and calm down a sensitive digestive tract Adding a fiber supplement gradually can help thicken stools by absorbing excess fluid and retrain the intestines to act more normally.  Slowly increase the dose over a few weeks.  Too much fiber too soon can backfire and cause cramping & bloating. It is okay to try and slow down diarrhea with a few doses of antidiarrheal medicines.   Bismuth subsalicylate (ex. Kayopectate, Pepto Bismol) for a few doses can help control diarrhea.  Avoid if pregnant.   Loperamide  (Imodium ) can slow down diarrhea.  Start with one tablet (2mg ) first.  Avoid if you are having fevers or severe pain.  ILEOSTOMY PATIENTS WILL HAVE CHRONIC DIARRHEA since their colon is not in use.    Drink plenty of liquids.   You will need to drink even more glasses of water /liquid a day to avoid getting dehydrated. Record output from your ileostomy.  Expect to empty the bag every 3-4 hours at first.  Most people with a permanent ileostomy empty their bag 4-6 times at the least.   Use antidiarrheal medicine (especially Imodium ) several times a day to avoid getting dehydrated.  Start with a dose at bedtime & breakfast.  Adjust up or down as needed.  Increase antidiarrheal medications as directed to avoid emptying the bag more than 8 times a day (every 3 hours). Work with your wound ostomy nurse to learn care for your ostomy.  See ostomy care instructions. TROUBLESHOOTING IRREGULAR BOWELS 1) Start with a soft & bland diet. No spicy, greasy, or fried foods.  2) Avoid gluten/wheat or dairy products from diet to see if symptoms improve. 3) Miralax  17gm or flax seed mixed in 8oz. water  or juice-daily. May use 2-4 times a day as needed. 4) Gas-X, Phazyme, etc. as needed for gas & bloating.  5) Prilosec (omeprazole) over-the-counter as needed 6)  Consider probiotics (Align, Activa, etc) to help calm the bowels down  Call your doctor if you are getting worse or not getting better.  Sometimes further testing (cultures, endoscopy, X-ray studies, CT scans, bloodwork, etc.) may be needed to help diagnose and treat the cause of the diarrhea. [facility] [address], [address], [place], [place]  [address] [phone] - Main.    [phone]  - Toll Free.   [phone] - Fax [email or web address]

## 2024-06-06 NOTE — Progress Notes (Signed)
 Stopped Heparin  drip at 0615 per Pharmacist.

## 2024-06-06 NOTE — Progress Notes (Signed)
 " PROGRESS NOTE    Benjamin [name]  FMW:[id number] DOB: 1971/03/27 DOA: 06/01/2024 PCP: Gretta Ozell CROME, PA-C    Brief Narrative:   Benjamin [name] is a 54 y.o. 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  Onset night prior.  History of recurrent bouts of diverticulitis.  Patient reports he took some Augmentin  he had at home for few days about 1 week ago that resolved his pain.  But since pain recurred overnight.  Has been seen outpatient by [facility] surgery, Dr. Ebb in June 2025.  In the ED, temperature 98.7 F, HR 126, RR 20, BP 191/128, SpO2 98% on room air.  WBC 11.8, hemoglobin 15.7, plate count 645.  Sodium 139, potassium 4.4, chloride 104, CO2 22, glucose 102, BUN 17, creat 1.15.  AST 26, ALT 20, total bilirubin 0.5.  Lipase less than 10.  Urinalysis unrevealing.  CT abdomen/pelvis with contrast with acute diverticulitis involving the mid sigmoid colon with suspected peripherally enhancing intramural abscess measuring approximately 3.0 x 1.9 cm, no evidence of perforation.  Assessment & Plan:   Acute diverticulitis with intramural abscess, recurrent Patient presenting with recurrent left lower quadrant abdominal pain, onset night prior to ED presentation.  But does report recurrence of pain 1 week ago in which she took Augmentin  with resolution of symptoms.  Patient has afebrile with elevated WBC count of 11.8.  CT abdomen/pelvis with acute diverticulitis mid sigmoid colon with intramural abscess measuring 3.0 x 1.9 cm, no evidence of perforation. -- General Surgery following, appreciate assistance -- WBC 11.8>15.9>9.9>10.3>8.6>10.0 -- Zosyn  3.375 g IV q8h  -- Holding Xarelto  -- Hydrocodone -acetaminophen  5/325 mg 1-2 tablets q4h PRN moderate/severe pain -- Dilaudid  0.5 mg IV every 2 hours as needed  severe breakthrough pain not relieved with oral medication -- N.p.o. for planned robot-assisted sigmoid colectomy today with Dr. Sheldon -- Further per general surgery  Acute urinary retention Patient developed urinary retention while boarding in the ED with bladder scan greater than 300 mL retained.  Initial attempts at In-N-Out catheterization unsuccessful by RN, Foley placed coud catheter by ER MD.  Suspect etiology to his obstructive process related to acute inflammatory changes within his abdomen from acute diverticulitis as above.  CT ab/pelvis with no hydronephrosis. -- Tamsulosin  0.4 g p.o. daily -- Oxybutynin  5 mg p.o. every 8 hours as needed bladder spasms -- Continue Foley catheter and will attempt to remove postoperatively  Hypokalemia Repleted.  Potassium 3.7 this morning  History of factor V Leiden/recurrent DVT On Xarelto  outpatient. -- Continue to hold Xarelto  per general surgery -- Heparin  drip, pharmacy consulted for dosing/monitoring  HTN --Continue Cardizem  240 mg p.o. daily -- hold home olmesatan-medoxomil -- Hydralazine  20 mg p.o. q6h PRN SBP >165  History of headache -- Fioricet  as needed  Depression -- Wellbutrin  450 mg PO daily  GERD -- Protonix  40 mg p.o. twice daily (substituted for home Nexium )   DVT prophylaxis: SCD's Start: 06/06/24 1140 SCD's Start: 06/05/24 0811 SCDs Start: 06/01/24 1622    Code Status: Full Code Family Communication: Updated spouse and mother present at bedside this morning  Disposition Plan:  Level of care: Med-Surg Status is: Inpatient Remains inpatient appropriate because: IV antibiotics, robotic sigmoid colectomy planned for tomorrow    Consultants:  General Surgery  Procedures:  None  Antimicrobials:  Ceftriaxone  Metronidazole  Zosyn  1/2>>   Subjective: Patient seen  examined bedside, lying in bed.  Abdominal pain remains resolved.  Remains on IV antibiotics.  Awaiting for planned robotic assisted colectomy  this afternoon.  No other questions or concerns at this time.  Denies current headache, no dizziness, no chest pain, no palpitations, no fever/chills/night sweats, no nausea/vomiting/diarrhea, no focal weakness, no fatigue, no paresthesia.  No acute events overnight per nursing staff.  Objective: Vitals:   06/05/24 1317 06/05/24 2154 06/06/24 0655 06/06/24 1104  BP: (!) 143/102 (!) 154/104 (!) 164/124 (!) 164/113  Pulse: 91 82 81 91  Resp: 16 16 17 17   Temp: 98.5 F (36.9 C) 97.7 F (36.5 C) 97.8 F (36.6 C) 98.1 F (36.7 C)  TempSrc: Oral Oral Oral Oral  SpO2: 98% 99%  96%  Weight:      Height:        Intake/Output Summary (Last 24 hours) at 06/06/2024 1332 Last data filed at 06/06/2024 0600 Gross per 24 hour  Intake 1034.71 ml  Output 1800 ml  Net -765.29 ml   Filed Weights   06/01/24 0927  Weight: 87.5 kg    Examination:  Physical Exam: GEN: NAD, alert and oriented x 3, wd/wn HEENT: NCAT, PERRL, EOMI, sclera clear, MMM PULM: CTAB w/o wheezes/crackles, normal respiratory effort on room air CV: RRR w/o M/G/R GI: abd soft, nondistended, NTTP, bowel sounds present GU: Foley catheter noted with clear yellow urine in collection bag MSK: no peripheral edema, moves all extremities independently with preserved muscle strength NEURO: No focal neurologic deficit PSYCH: normal mood/affect Integumentary: dry/intact, no rashes or wounds    Data Reviewed: I have personally reviewed following labs and imaging studies  CBC: Recent Labs  Lab 06/02/24 0451 06/03/24 0510 06/04/24 0410 06/05/24 0447 06/06/24 0522  WBC 15.9* 9.9 10.3 8.6 10.0  NEUTROABS 13.3*  --   --   --   --   HGB 14.2 13.5 14.2 13.9 15.1  HCT 41.6 39.9 41.8 41.5 44.3  MCV 89.1 90.1 90.1 90.6 89.5  PLT 291 233 248 277 320   Basic Metabolic Panel: Recent Labs  Lab 06/02/24 0451 06/03/24 0510 06/04/24 0410 06/05/24 0447 06/06/24 0522  NA 133* 137 135 136 137  K 3.9 3.4* 3.6 3.7 3.8  CL 98 101 101  103 103  CO2 23 24 20* 21* 20*  GLUCOSE 112* 97 102* 94 97  BUN 11 9 8 13 11   CREATININE 1.01 1.11 1.04 1.11 0.96  CALCIUM  9.1 9.1 9.3 9.1 9.5  MG  --   --  2.5* 2.6* 2.5*  PHOS  --   --   --  3.6  --    GFR: Estimated Creatinine Clearance: 96.3 mL/min (by C-G formula based on SCr of 0.96 mg/dL). Liver Function Tests: Recent Labs  Lab 06/01/24 0937 06/02/24 0451  AST 26 19  ALT 20 18  ALKPHOS 82 71  BILITOT 0.5 0.8  PROT 8.0 7.0  ALBUMIN 4.6 4.1   Recent Labs  Lab 06/01/24 0937  LIPASE <10*   No results for input(s): AMMONIA in the last 168 hours. Coagulation Profile: No results for input(s): INR, PROTIME in the last 168 hours. Cardiac Enzymes: No results for input(s): CKTOTAL, CKMB, CKMBINDEX, TROPONINI in the last 168 hours. BNP (last 3 results) No results for input(s): PROBNP in the last 8760 hours. HbA1C: Recent Labs    06/05/24 0447  HGBA1C 5.3   CBG: No results for input(s): GLUCAP in the last 168 hours. Lipid Profile: No results for input(s): CHOL, HDL, LDLCALC,  TRIG, CHOLHDL, LDLDIRECT in the last 72 hours. Thyroid  Function Tests: No results for input(s): TSH, T4TOTAL, FREET4, T3FREE, THYROIDAB in the last 72 hours. Anemia Panel: No results for input(s): VITAMINB12, FOLATE, FERRITIN, TIBC, IRON, RETICCTPCT in the last 72 hours. Sepsis Labs: No results for input(s): PROCALCITON, LATICACIDVEN in the last 168 hours.  Recent Results (from the past 240 hours)  MRSA Next Gen by PCR, Nasal     Status: None   Collection Time: 06/05/24 10:12 PM   Specimen: Nasal Mucosa; Nasal Swab  Result Value Ref Range Status   MRSA by PCR Next Gen NOT DETECTED NOT DETECTED Final    Comment: (NOTE) The GeneXpert MRSA Assay (FDA approved for NASAL specimens only), is one component of a comprehensive MRSA colonization surveillance program. It is not intended to diagnose MRSA infection nor to guide or monitor treatment  for MRSA infections. Test performance is not FDA approved in patients less than 41 years old. Performed at [facility], [address], [place], [place] [address]          Radiology Studies: No results found.       Scheduled Meds:  [MAR Hold] acetaminophen   1,000 mg Oral Q6H   Or   [MAR Hold] acetaminophen   650 mg Rectal Q6H   [MAR Hold] buPROPion   450 mg Oral Daily   [MAR Hold] Chlorhexidine  Gluconate Cloth  6 each Topical Daily   [MAR Hold] diltiazem   240 mg Oral Daily   [MAR Hold] docusate sodium   100 mg Oral BID   [MAR Hold] feeding supplement  1 Container Oral BID BM   feeding supplement  592 mL Oral Once   [MAR Hold] fluconazole   200 mg Oral Weekly   [MAR Hold] pantoprazole   40 mg Oral BID   [MAR Hold] polyethylene glycol  17 g Oral Daily   [MAR Hold] rosuvastatin   40 mg Oral Daily   scopolamine   1 patch Transdermal Q72H   [MAR Hold] tamsulosin   0.4 mg Oral Daily   [MAR Hold] valACYclovir   500 mg Oral Daily   Continuous Infusions:  cefoTEtan  (CEFOTAN ) IV     lactated ringers  100 mL/hr at 06/06/24 1139   [MAR Hold] piperacillin -tazobactam (ZOSYN )  IV 3.375 g (06/06/24 0510)     LOS: 5 days    Time spent: 48 minutes spent on 06/06/2024 caring for this patient face-to-face including chart review, ordering labs/tests, documenting, discussion with nursing staff, consultants, updating family and interview/physical exam    Camellia PARAS Dashanna Kinnamon, DO [facility] Available via Epic secure chat 7am-7pm After these hours, please refer to coverage provider listed on [email or web address] 06/06/2024, 1:32 PM   "

## 2024-06-06 NOTE — Progress Notes (Signed)
 PHARMACY - ANTICOAGULATION CONSULT NOTE  Pharmacy Consult for heparin  Indication: factor V leiden / DVT (Xarelto  on hold)  Allergies[1]  Patient Measurements: Height: 5' 9 (175.3 cm) Weight: 87.5 kg (193 lb) IBW/kg (Calculated) : 70.7 HEPARIN  DW (KG): 87.5  Vital Signs: Temp: 97.8 F (36.6 C) (01/06 0655) Temp Source: Oral (01/06 0655) BP: 164/124 (01/06 0655) Pulse Rate: 81 (01/06 0655)  Labs: Recent Labs    06/03/24 0820 06/03/24 1450 06/04/24 0410 06/04/24 1405 06/04/24 2017 06/05/24 0447 06/06/24 0522  HGB  --    < > 14.2  --   --  13.9 15.1  HCT  --   --  41.8  --   --  41.5 44.3  PLT  --   --  248  --   --  277 320  APTT 70*  --   --   --   --   --   --   HEPARINUNFRC  --    < > 0.30   < > 0.35 0.43 0.32  CREATININE  --   --  1.04  --   --  1.11 0.96   < > = values in this interval not displayed.    Estimated Creatinine Clearance: 96.3 mL/min (by C-G formula based on SCr of 0.96 mg/dL).   Medications: PTA Xarelto  -Last dose: 12/31 AM  Assessment: Pt is a 45 yoM who takes Xarelto  PTA for history of  FV Leiden with history of multiple DVTs. Pt admitted with diverticulitis with abscess. Xarelto  on hold pending potential surgical intervention. Pharmacy consulted to dose heparin .  Today, 06/06/2024: Heparin  level 0.32, remains therapeutic on heparin  1600 units/hr  Heparin  was stopped by RN on 1/5 at 1400 (unclear reason), restarted on second shift. Heparin  was appropriately stopped at 06:15 this morning per orders, 6 hours prior to surgery planned on 1/6 at 12:20.   CBC: WNL, stable No bleeding or complications reported by RN  Goal of Therapy: Heparin  level 0.3-0.7 units/ml Monitor platelets by anticoagulation protocol: Yes  Plan: Heparin  remains on hold.  Follow up post procedure anticoagulation plans.   CBC, heparin  level daily Monitor for signs of bleeding  Wanda Hasting PharmD, BCPS [facility] main pharmacy [phone] 06/06/2024 7:14 AM        [1]   Allergies Allergen Reactions   Bismuth Subsalicylate Anaphylaxis, Swelling and Other (See Comments)    The throat swells   Compazine  [Prochlorperazine  Edisylate] Anxiety and Other (See Comments)    Cervical dystonia and panic attacks, also

## 2024-06-06 NOTE — Op Note (Signed)
 Preoperative diagnosis: Diverticulitis  Postoperative diagnosis: Diverticulitis  Procedure: Cystoscopy with indocyanine green  dye into bilateral ureters  Surgeon: Gretel CANDIE Renda Mickey MD  Anesthesia: General  Complications: None  Indication: Benjamin [name] is a 54 year old gentleman with diverticular disease requiring surgical intervention by Dr. Sheldon.  It was requested that urology be available to inject indocyanine green  dye into each ureter for intraoperative identification purposes.  The potential risks and complications of this procedure were discussed with the patient and he gave informed consent.  Description of procedure: The patient was taken the operating room and a general anesthetic was administered.  He was given preoperative antibiotics, placed in the dorsolithotomy position, and prepped and draped in the usual sterile fashion.  Next, preoperative timeout was performed.  Cystourethroscopy was then performed revealing a normal anterior and posterior urethra with mild prostatic enlargement.  The ureteral orifices were identified in their normal anatomic location.  No bladder tumors were identified.  Attention then turned to the left ureteral orifice.  This was intubated with a 6 French ureteral catheter.  7.5 cc of indocyanine green  dye was then injected.  An identical procedure was then performed on the contralateral side.  An 36 French Foley catheter was then inserted into the bladder without difficulty.  The patient tolerated the procedure well without complications.  The case was then turned over to Dr. Sheldon for the remaining portion of the procedure.

## 2024-06-06 NOTE — Interval H&P Note (Signed)
 History and Physical Interval Note:  06/06/2024 1:08 PM  Benjamin [name]  has presented today for surgery, with the diagnosis of DIVERTICULITIS.  The various methods of treatment have been discussed with the patient and family. After consideration of risks, benefits and other options for treatment, the patient has consented to  Procedures: COLECTOMY, SIGMOID, ROBOT-ASSISTED (N/A) CREATION, CECOSTOMY (N/A) PROCTOSCOPY (N/A) CYSTOSCOPY WITH INDOCYANINE GREEN  IMAGING (ICG) (N/A) as a surgical intervention.  The patient's history has been reviewed, patient examined, no change in status, stable for surgery.  I have reviewed the patient's chart and labs.  Questions were answered to the patient's satisfaction.    I have re-reviewed the the patient's records, history, medications, and allergies.  I have re-examined the patient.  I again discussed intraoperative plans and goals of post-operative recovery.  The patient agrees to proceed.  Benjamin [name]  04/08/1971 [id number]  Patient Care Team: Gretta Ozell LITTIE [name] as PCP - General (Urgent Care) [name], MD as Consulting Physician (Ophthalmology) Buck Saucer, MD as Consulting Physician (Neurology) Ines Onetha NOVAK, MD as Consulting Physician (Neurology) Timmy Maude SAUNDERS, MD as Consulting Physician (Oncology) Franchot Lauraine HERO, NP as Nurse Practitioner (Hematology and Oncology) Sheldon Standing, MD as Consulting Physician (General Surgery) Shila Gustav GAILS, MD as Consulting Physician (Gastroenterology)  Patient Active Problem List   Diagnosis Date Noted   Acute urinary retention 06/01/2024   History of DVT (deep vein thrombosis) 03/09/2024   History of factor V Leiden mutation 03/09/2024   Migraine without status migrainosus, not intractable 03/09/2024   Diverticulitis of large intestine with abscess 09/21/2023   Palpitations 06/11/2020   Chronic migraine without aura, intractable, with status migrainosus 04/09/2017   New daily  persistent headache 03/29/2017   Urinary frequency 09/02/2015   History of diverticulitis 08/09/2015   BMI 26.0-26.9,adult 09/17/2014   Adjustment disorder with mixed anxiety and depressed mood 02/19/2014   Chronic cluster headache, not intractable 02/19/2014   BURSITIS, HIP 09/30/2009   Backache 10/17/2008   Allergic rhinitis 07/11/2008   Benign prostatic hyperplasia 07/21/2007   Essential hypertension 01/05/2007   PSORIASIS 01/05/2007    Past Medical History:  Diagnosis Date   Acute DVT of left tibial vein (HCC) 04/24/2017   Xarelto  started (dx'd in emergency dept).  Dr. Timmy recommended full dose xarelto  x 64mo, with an additional year of maintenance dose xarelto  (as of 05/2017 initial consult note).  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  Pt Factor V leiden heterozygote.  Stable as of 09/2017 hem f/u.   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  Pt referred for sleep study.   Chronic renal insufficiency, stage II (mild)    GFR 60s-70s (suspect HTN and NSAID damage as etiology).  GFR dipped into 50s fall 2018 after lots of NSAID use for chronic daily HA's.   Diverticulitis 2016   CT 01/2015   Diverticulitis of colon 07/02/2015  Frequent headaches    Gastric polyp 2006   GERD without esophagitis    Herpes labialis    Hiatal hernia    Hyperlipidemia    HYPERTENSION  01/05/2007   OSA (obstructive sleep apnea) 04/2017   Dr. Buck eval 04/2017-plan for sleep study.   PSORIASIS 01/05/2007    Past Surgical History:  Procedure Laterality Date   COLONOSCOPY W/ POLYPECTOMY  10/04/15   7mm tubular adenoma + diverticulosis: recall 5 yrs (Dr. Aneita)   ESOPHAGOGASTRODUODENOSCOPY  04/12/14   Barrett's esophagus   LIPOMA EXCISION     several, arm    Social History   Socioeconomic History   Marital status: Single    Spouse name: Not on file   Number of children: 3   Years of education: Not on file   Highest education level: Not on file  Occupational History   Occupation: regional mudlogger    Comment: Industrial/product Designer  Tobacco Use   Smoking status: Never   Smokeless tobacco: Never  Vaping Use   Vaping status: Never Used  Substance and Sexual Activity   Alcohol  use: Yes    Alcohol /week: 2.0 standard drinks of alcohol     Types: 2 Cans of beer per week    Comment: rare   Drug use: No   Sexual activity: Yes    Birth control/protection: None  Other Topics Concern   Not on file  Social History Narrative   Currently separated as of 01/2014, 2 children.  One younger brother.   Occupation: airline pilot for [employer]   No tobacco.   Occ alcohol  (1-2 x/month).  No hx of alc/drug prob.   Exercise: runs about 2 times a week. Not as much lately due to medical state   Diet: regular american diet.   Right handed   1 cup coffee daily      Social Drivers of Health   Tobacco Use: Low Risk (06/06/2024)   Patient History    Smoking Tobacco Use: Never    Smokeless Tobacco Use: Never    Passive Exposure: Not on file  Financial Resource Strain: Low Risk  (10/15/2023)   Received from [facility]   Overall Financial Resource Strain (CARDIA)    Difficulty of Paying Living Expenses: Not very hard  Food Insecurity: No Food Insecurity (06/01/2024)   Epic    Worried About Running Out of Food in the Last Year: Never true    Ran Out of Food in  the Last Year: Never true  Transportation Needs: No Transportation Needs (06/01/2024)   Epic    Lack of Transportation (Medical): No    Lack of Transportation (Non-Medical): No  Physical Activity: Not on file  Stress: Not on file  Social Connections: Unknown (03/09/2024)   Social Connection and Isolation Panel    Frequency of Communication with Friends and Family: More than three times a week    Frequency of Social Gatherings with Friends and Family: Once a week    Attends Religious Services: Not on Insurance Claims Handler of Clubs or Organizations: No    Attends Banker Meetings: Never    Marital Status: Married  Catering Manager Violence: Not At Risk (06/01/2024)   Epic    Fear of Current or Ex-Partner: No    Emotionally Abused: No    Physically Abused: No    Sexually Abused: No  Depression (PHQ2-9): Not on file  Alcohol  Screen: Not on file  Housing: Low Risk (06/01/2024)   Epic  Unable to Pay for Housing in the Last Year: No    Number of Times Moved in the Last Year: 0    Homeless in the Last Year: No  Utilities: Not At Risk (06/01/2024)   Epic    Threatened with loss of utilities: No  Health Literacy: Not on file    Family History  Problem Relation Age of Onset   Hypertension Mother    CAD Father        around age 57   Heart disease Father    Hyperlipidemia Father    Hypertension Father    Hypertension Brother    Hyperlipidemia Brother    Lung cancer Maternal Grandmother    Colon cancer Maternal Grandfather    Cancer Maternal Grandfather        type unknown   CAD Paternal Grandmother    Diabetes Paternal Grandmother    Esophageal cancer Neg Hx    Stomach cancer Neg Hx     Medications Prior to Admission  Medication Sig Dispense Refill Last Dose/Taking   acetaminophen  (TYLENOL ) 500 MG tablet Take 500-1,000 mg by mouth every 6 (six) hours as needed for moderate pain (pain score 4-6).   Unknown   Aspirin-Acetaminophen  (GOODYS BODY PAIN PO) Take 1 packet by  mouth See admin instructions. Goody's Cool Orange powder - Pour the contents of one packet on the tongue and dissolve orally twice a day as needed for pain. Follow with water .   Unknown   benzonatate  (TESSALON ) 100 MG capsule Take 100 mg by mouth 3 (three) times daily as needed for cough.   Unknown   buPROPion  (WELLBUTRIN  XL) 150 MG 24 hr tablet Take 450 mg by mouth in the morning.   05/31/2024 Morning   butalbital -acetaminophen -caffeine  (FIORICET ) 50-325-40 MG tablet Take 1 tablet by mouth every 4 (four) hours as needed for headache. 14 tablet 0 Taking As Needed   clindamycin -benzoyl peroxide (BENZACLIN) gel Apply 1 Application topically See admin instructions. Apply in the morning as directed to any acne flares on the face   Unknown   diltiazem  (CARDIZEM  CD) 240 MG 24 hr capsule TAKE 1 CAPSULE BY MOUTH EVERY DAY (Patient taking differently: Take 240 mg by mouth in the morning.) 90 capsule 1 05/31/2024 Morning   fluconazole  (DIFLUCAN ) 200 MG tablet Take 200 mg by mouth every Monday.   05/29/2024   HYDROcodone -acetaminophen  (NORCO/VICODIN) 5-325 MG tablet Take 1 tablet by mouth every 8 (eight) hours as needed (for DIVERTICULITIS PAIN).   Unknown   KLAYESTA  powder Apply 1 Application topically 2 (two) times daily as needed (for rashes).   Unknown   NEXIUM  24HR 20 MG capsule Take 40 mg by mouth daily before breakfast.   05/31/2024 Morning   olmesartan (BENICAR) 40 MG tablet Take 40 mg by mouth daily.   05/31/2024 Morning   ondansetron  (ZOFRAN -ODT) 4 MG disintegrating tablet Take 1 tablet (4 mg total) by mouth every 8 (eight) hours as needed for nausea or vomiting. (Patient taking differently: Take 4 mg by mouth every 8 (eight) hours as needed for nausea or vomiting (DISSOLVE ORALLY).) 20 tablet 0 Unknown   rivaroxaban  (XARELTO ) 20 MG TABS tablet Take 1 tablet (20 mg total) by mouth daily with supper. (Patient taking differently: Take 20 mg by mouth daily with breakfast.) 30 tablet 0 05/31/2024 at  8:00 AM    rosuvastatin  (CRESTOR ) 40 MG tablet Take 1 tablet (40 mg total) by mouth once a week. (Patient taking differently: Take 40 mg by mouth once a week. Friday)  4 tablet 12 05/26/2024   tamsulosin  (FLOMAX ) 0.4 MG CAPS capsule Take 1 capsule (0.4 mg total) by mouth daily. 90 capsule 1 05/31/2024 Morning   tiZANidine  (ZANAFLEX ) 4 MG tablet Take 4 mg by mouth every 8 (eight) hours as needed for muscle spasms.   Unknown   valACYclovir  (VALTREX ) 500 MG tablet Take 1 tablet (500 mg total) by mouth daily. 30 tablet 12 05/31/2024 Morning   ZYRTEC -D ALLERGY & CONGESTION 5-120 MG tablet Take 1 tablet by mouth every 12 (twelve) hours as needed for allergies or rhinitis.   Unknown   losartan  (COZAAR ) 25 MG tablet Take 2 tablets (50 mg total) by mouth daily. (Patient not taking: Reported on 06/01/2024) 60 tablet 0 Not Taking    Current Facility-Administered Medications  Medication Dose Route Frequency Provider Last Rate Last Admin   [MAR Hold] acetaminophen  (TYLENOL ) tablet 1,000 mg  1,000 mg Oral Q6H Ann Fine, MD   1,000 mg at 06/04/24 1000   Or   [MAR Hold] acetaminophen  (TYLENOL ) suppository 650 mg  650 mg Rectal Q6H Ann Fine, MD       [MAR Hold] buPROPion  (WELLBUTRIN  XL) 24 hr tablet 450 mg  450 mg Oral Daily Plunkett, Whitney, MD   450 mg at 06/06/24 0929   [MAR Hold] butalbital -acetaminophen -caffeine  (FIORICET ) 50-325-40 MG per tablet 1 tablet  1 tablet Oral Q4H PRN Laurence Locus, DO   1 tablet at 06/06/24 9360   cefoTEtan  (CEFOTAN ) 2 g in sodium chloride  0.9 % 100 mL IVPB  2 g Intravenous On Call to OR Sheldon Standing, MD       [facility] Hold] Chlorhexidine  Gluconate Cloth 2 % PADS 6 each  6 each Topical Daily Laurence Locus, DO   6 each at 06/06/24 0930   [MAR Hold] diltiazem  (CARDIZEM  CD) 24 hr capsule 240 mg  240 mg Oral Daily Plunkett, Whitney, MD   240 mg at 06/06/24 0929   [MAR Hold] docusate sodium  (COLACE) capsule 100 mg  100 mg Oral BID Laurence Locus, DO   100 mg at 06/06/24 0927   [MAR Hold]  feeding supplement (BOOST / RESOURCE BREEZE) liquid 1 Container  1 Container Oral BID BM Tanda Locus, MD   1 Container at 06/05/24 1016   feeding supplement (ENSURE PRE-SURGERY) liquid 592 mL  592 mL Oral Once Sheldon Standing, MD       [facility] Hold] fluconazole  (DIFLUCAN ) tablet 200 mg  200 mg Oral Weekly Austria, Eric J, DO   200 mg at 06/05/24 1014   [MAR Hold] hydrALAZINE  (APRESOLINE ) tablet 25 mg  25 mg Oral Q6H PRN Austria, Eric J, DO       [facility] Hold] HYDROcodone -acetaminophen  (NORCO/VICODIN) 5-325 MG per tablet 1-2 tablet  1-2 tablet Oral Q4H PRN Austria, Eric J, DO   1 tablet at 06/05/24 1516   [MAR Hold] HYDROmorphone  (DILAUDID ) injection 0.5 mg  0.5 mg Intravenous Q2H PRN Austria, Eric J, DO   0.5 mg at 06/06/24 0508   [MAR Hold] labetalol  (NORMODYNE ) injection 10 mg  10 mg Intravenous Q6H PRN Laurence Locus, DO   10 mg at 06/06/24 9341   lactated ringers  infusion   Intravenous Continuous Jefm Garnette LABOR, MD 100 mL/hr at 06/06/24 1139 New Bag at 06/06/24 1139   [MAR Hold] ondansetron  (ZOFRAN ) tablet 4 mg  4 mg Oral Q6H PRN Laurence Locus, DO       Or   ILDA Hold] ondansetron  (ZOFRAN ) injection 4 mg  4 mg Intravenous Q6H PRN Laurence Locus, DO   4  mg at 06/02/24 1513   [MAR Hold] oxybutynin  (DITROPAN ) tablet 5 mg  5 mg Oral Q8H PRN Chavez, Abigail, NP   5 mg at 06/02/24 1928   [MAR Hold] pantoprazole  (PROTONIX ) EC tablet 40 mg  40 mg Oral BID Laurence Locus, DO   40 mg at 06/06/24 0928   [MAR Hold] piperacillin -tazobactam (ZOSYN ) IVPB 3.375 g  3.375 g Intravenous Q8H Utomwen, Adesuwa, RPH 12.5 mL/hr at 06/06/24 0510 3.375 g at 06/06/24 0510   [MAR Hold] polyethylene glycol (MIRALAX  / GLYCOLAX ) packet 17 g  17 g Oral Daily Tanda Locus, MD   17 g at 06/06/24 0930   [MAR Hold] rosuvastatin  (CRESTOR ) tablet 40 mg  40 mg Oral Daily Austria, Eric J, DO   40 mg at 06/06/24 9071   scopolamine  (TRANSDERM-SCOP) 1 MG/3DAYS 1 mg  1 patch Transdermal Q72H Jefm Garnette LABOR, MD   1 mg at 06/06/24 1149   [MAR Hold]  tamsulosin  (FLOMAX ) capsule 0.4 mg  0.4 mg Oral Daily Laurence Locus, DO   0.4 mg at 06/06/24 9071   [facility] Hold] valACYclovir  (VALTREX ) tablet 500 mg  500 mg Oral Daily Austria, Eric J, DO   500 mg at 06/06/24 9070   Facility-Administered Medications Ordered in Other Encounters  Medication Dose Route Frequency Provider Last Rate Last Admin   gadopentetate dimeglumine  (MAGNEVIST ) injection 18 mL  18 mL Intravenous Once PRN Ahern, Antonia B, MD         Allergies[1]  BP (!) 164/113 (BP Location: Left Arm)   Pulse 91   Temp 98.1 F (36.7 C) (Oral)   Resp 17   Ht 5' 9 (1.753 m)   Wt 87.5 kg   SpO2 96%   BMI 28.50 kg/m   Labs: Results for orders placed or performed during the hospital encounter of 06/01/24 (from the past 48 hours)  Heparin  level (unfractionated)     Status: None   Collection Time: 06/04/24  2:05 PM  Result Value Ref Range   Heparin  Unfractionated 0.33 0.30 - 0.70 IU/mL    Comment: (NOTE) The clinical reportable range upper limit is being lowered to >1.10 to align with the FDA approved guidance for the current laboratory assay.  If heparin  results are below expected values, and patient dosage has  been confirmed, suggest follow up testing of antithrombin III  levels. Performed at [facility], [address] [place], [place] [address]   Heparin  level (unfractionated)     Status: None   Collection Time: 06/04/24  8:17 PM  Result Value Ref Range   Heparin  Unfractionated 0.35 0.30 - 0.70 IU/mL    Comment: (NOTE) The clinical reportable range upper limit is being lowered to >1.10 to align with the FDA approved guidance for the current laboratory assay.  If heparin  results are below expected values, and patient dosage has  been confirmed, suggest follow up testing of antithrombin III  levels. Performed at [facility], [address], [place], [place] [address]   CBC     Status: None   Collection Time: 06/05/24  4:47 AM   Result Value Ref Range   WBC 8.6 4.0 - 10.5 K/uL   RBC 4.58 4.22 - 5.81 MIL/uL   Hemoglobin 13.9 13.0 - 17.0 g/dL   HCT 58.4 60.9 - 47.9 %   MCV 90.6 80.0 - 100.0 fL   MCH 30.3 26.0 - 34.0 pg   MCHC 33.5 30.0 - 36.0 g/dL   RDW 87.3 88.4 - 84.4 %   Platelets  277 150 - 400 K/uL   nRBC 0.0 0.0 - 0.2 %    Comment: Performed at [facility], [address] [place], [place] [address]  Heparin  level (unfractionated)     Status: None   Collection Time: 06/05/24  4:47 AM  Result Value Ref Range   Heparin  Unfractionated 0.43 0.30 - 0.70 IU/mL    Comment: (NOTE) The clinical reportable range upper limit is being lowered to >1.10 to align with the FDA approved guidance for the current laboratory assay.  If heparin  results are below expected values, and patient dosage has  been confirmed, suggest follow up testing of antithrombin III  levels. Performed at [facility], [address], [place], [place] [address]   Basic metabolic panel     Status: Abnormal   Collection Time: 06/05/24  4:47 AM  Result Value Ref Range   Sodium 136 135 - 145 mmol/L   Potassium 3.7 3.5 - 5.1 mmol/L   Chloride 103 98 - 111 mmol/L   CO2 21 (L) 22 - 32 mmol/L   Glucose, Bld 94 70 - 99 mg/dL    Comment: Glucose reference range applies only to samples taken after fasting for at least 8 hours.   BUN 13 6 - 20 mg/dL   Creatinine, Ser 8.88 0.61 - 1.24 mg/dL   Calcium  9.1 8.9 - 10.3 mg/dL   GFR, Estimated >39 >39 mL/min    Comment: (NOTE) Calculated using the CKD-EPI Creatinine Equation (2021)    Anion gap 13 5 - 15    Comment: Performed at [facility], [address], [place], [place] [address]  Magnesium      Status: Abnormal   Collection Time: 06/05/24  4:47 AM  Result Value Ref Range   Magnesium  2.6 (H) 1.7 - 2.4 mg/dL    Comment: Performed at [facility], [address] [place], [place] [address]  Phosphorus     Status: None    Collection Time: 06/05/24  4:47 AM  Result Value Ref Range   Phosphorus 3.6 2.5 - 4.6 mg/dL    Comment: Performed at [facility], [address] [place], [place] [address]  Hemoglobin A1c     Status: None   Collection Time: 06/05/24  4:47 AM  Result Value Ref Range   Hgb A1c MFr Bld 5.3 4.8 - 5.6 %    Comment: (NOTE) Diagnosis of Diabetes The following HbA1c ranges recommended by the American Diabetes Association (ADA) may be used as an aid in the diagnosis of diabetes mellitus.  Hemoglobin             Suggested A1C NGSP%              Diagnosis  <5.7                   Non Diabetic  5.7-6.4                Pre-Diabetic  >6.4                   Diabetic  <7.0                   Glycemic control for                       adults with diabetes.     Mean Plasma Glucose 105.41 mg/dL    Comment: Performed at [facility], [address], [place], [place] [address]  MRSA Next Gen by PCR, Nasal     Status: None   Collection Time: 06/05/24 10:12 PM   Specimen: Nasal Mucosa; Nasal Swab  Result Value Ref Range   MRSA by PCR Next Gen NOT DETECTED NOT DETECTED    Comment: (NOTE) The GeneXpert MRSA Assay (FDA approved for NASAL specimens only), is one component of a comprehensive MRSA colonization surveillance program. It is not intended to diagnose MRSA infection nor to guide or monitor treatment for MRSA infections. Test performance is not FDA approved in patients less than 21 years old. Performed at [facility], [address] [place], [place] [address]   CBC     Status: None   Collection Time: 06/06/24  5:22 AM  Result Value Ref Range   WBC 10.0 4.0 - 10.5 K/uL   RBC 4.95 4.22 - 5.81 MIL/uL   Hemoglobin 15.1 13.0 - 17.0 g/dL   HCT 55.6 60.9 - 47.9 %   MCV 89.5 80.0 - 100.0 fL   MCH 30.5 26.0 - 34.0 pg   MCHC 34.1 30.0 - 36.0 g/dL   RDW 87.3 88.4 - 84.4 %   Platelets 320 150 - 400 K/uL   nRBC 0.0 0.0 - 0.2 %    Comment: Performed  at [facility], [address], [place], [place] [address]  Heparin  level (unfractionated)     Status: None   Collection Time: 06/06/24  5:22 AM  Result Value Ref Range   Heparin  Unfractionated 0.32 0.30 - 0.70 IU/mL    Comment: (NOTE) The clinical reportable range upper limit is being lowered to >1.10 to align with the FDA approved guidance for the current laboratory assay.  If heparin  results are below expected values, and patient dosage has  been confirmed, suggest follow up testing of antithrombin III  levels. Performed at [facility], [address] [place], [place] [address]   Basic metabolic panel with GFR     Status: Abnormal   Collection Time: 06/06/24  5:22 AM  Result Value Ref Range   Sodium 137 135 - 145 mmol/L   Potassium 3.8 3.5 - 5.1 mmol/L   Chloride 103 98 - 111 mmol/L   CO2 20 (L) 22 - 32 mmol/L   Glucose, Bld 97 70 - 99 mg/dL    Comment: Glucose reference range applies only to samples taken after fasting for at least 8 hours.   BUN 11 6 - 20 mg/dL   Creatinine, Ser 9.03 0.61 - 1.24 mg/dL   Calcium  9.5 8.9 - 10.3 mg/dL   GFR, Estimated >39 >39 mL/min    Comment: (NOTE) Calculated using the CKD-EPI Creatinine Equation (2021)    Anion gap 14 5 - 15    Comment: Performed at [facility], [address] [place], [place] [address]  Magnesium      Status: Abnormal   Collection Time: 06/06/24  5:22 AM  Result Value Ref Range   Magnesium  2.5 (H) 1.7 - 2.4 mg/dL    Comment: Performed at [facility], [address] [place], [place] [address]    Imaging / Studies: CT ABDOMEN PELVIS W CONTRAST Result Date: 06/01/2024 CLINICAL DATA:  Left lower quadrant abdominal pain. History of diverticulitis. EXAM: CT ABDOMEN AND PELVIS WITH CONTRAST TECHNIQUE: Multidetector CT imaging of the abdomen and pelvis was performed using the standard protocol following bolus administration of intravenous  contrast. RADIATION DOSE REDUCTION: This exam was performed according to the  departmental dose-optimization program which includes automated exposure control, adjustment of the mA and/or kV according to patient size and/or use of iterative reconstruction technique. CONTRAST:  OMNIPAQUE  IOHEXOL  300 MG/ML  SOLN COMPARISON:  03/08/2024. FINDINGS: Lower chest: No acute abnormality. Hepatobiliary: No focal liver abnormality is seen. No gallstones, gallbladder wall thickening, or biliary dilatation. Pancreas: Unremarkable. No pancreatic ductal dilatation or surrounding inflammatory changes. Spleen: Unremarkable. Adrenals/Urinary Tract: Adrenal glands are unremarkable. Unchanged bilateral nonobstructive renal calculi. No ureteral calculi. No hydronephrosis. Bladder is unremarkable. Stomach/Bowel: Acute diverticulitis involving the mid sigmoid colon with submucosal edema and findings suspicious for a peripherally enhancing intramural abscess measuring approximately 3.0 x 1.9 cm. Similar findings were noted on the prior exam. No appreciable evidence of perforation. Diverticulosis of the sigmoid and descending colon. Stomach is within normal limits. No obstruction. Appendix appears normal. Vascular/Lymphatic: Abdominal aorta is normal in caliber with mild aortoiliac atherosclerotic calcification. Similar appearance of small scattered mesenteric and retroperitoneal lymph nodes, not definitively enlarged by size criteria. Reproductive: Prostate is unremarkable. Other: No abdominopelvic ascites.  No intraperitoneal free air. Musculoskeletal: No acute or significant osseous findings. IMPRESSION: 1. Acute diverticulitis involving the mid sigmoid colon with suspected peripherally enhancing intramural abscess measuring approximately 3.0 x 1.9 cm. Similar findings were noted on the prior exam. No evidence of perforation. 2. Unchanged nonobstructive bilateral renal calculi. No hydronephrosis. Electronically Signed   By: Harrietta Sherry M.D.   On: 06/01/2024 11:42     .Elspeth KYM Schultze, M.D., F.A.C.S. Gastrointestinal and Minimally Invasive Surgery [facility] [address] [place], [place] [address] [phone] Main / Paging  06/06/2024 1:08 PM    Elspeth JAYSON Schultze      [1]  Allergies Allergen Reactions   Bismuth Subsalicylate Anaphylaxis, Swelling and Other (See Comments)    The throat swells   Compazine  [Prochlorperazine  Edisylate] Anxiety and Other (See Comments)    Cervical dystonia and panic attacks, also

## 2024-06-06 NOTE — Progress Notes (Signed)
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 , HTN, hx of recurrent diverticulitis and came in with abd pain   pt having surgery today for colectomy, sigmoid, tobotic assisted  Orders in place:  Yes   Communication with surgical team if no orders: n/a  Labs, test, and orders reviewed: yes  Requires surgical clearance:  No  What type of clearance: n/a  Clearance received: n/a  Barriers noted:n/a   Intervention provided by [facility] team: n/a  Barrier resolved:  not applicable   -------------------------------------------------------------------------------------------------------------------  [employer], Ronal DELENA Bald Please contact us  directly via secure chat (search for [facility]) or by calling us  at [phone] [facility]).  "

## 2024-06-06 NOTE — Transfer of Care (Signed)
 Immediate Anesthesia Transfer of Care Note  Patient: Benjamin [name]  Procedure(s) Performed: Procedures: COLECTOMY, SIGMOID, ROBOT-ASSISTED (N/A) CREATION, CECOSTOMY (N/A) PROCTOSCOPY (N/A) CYSTOSCOPY WITH INDOCYANINE GREEN  IMAGING (ICG) (N/A)  Patient Location: PACU  Anesthesia Type:General  Level of Consciousness:  sedated, patient cooperative and responds to stimulation  Airway & Oxygen Therapy:Patient Spontanous Breathing and Patient connected to face mask oxgen  Post-op Assessment:  Report given to PACU RN and Post -op Vital signs reviewed and stable  Post vital signs:  Reviewed and stable  Last Vitals:  Vitals:   06/06/24 0655 06/06/24 1104  BP: (!) 164/124 (!) 164/113  Pulse: 81 91  Resp: 17 17  Temp: 36.6 C 36.7 C  SpO2:  96%    Complications: No apparent anesthesia complications

## 2024-06-06 NOTE — Anesthesia Procedure Notes (Signed)
 Procedure Name: Intubation Date/Time: 06/06/2024 1:55 PM  Performed by: Vincenzo Show, CRNAPre-anesthesia Checklist: Patient identified, Emergency Drugs available, Suction available, Patient being monitored and Timeout performed Patient Re-evaluated:Patient Re-evaluated prior to induction Oxygen Delivery Method: Circle system utilized Preoxygenation: Pre-oxygenation with 100% oxygen Induction Type: IV induction Ventilation: Mask ventilation without difficulty Laryngoscope Size: Mac and 4 Grade View: Grade II Tube type: Oral Tube size: 7.5 mm Number of attempts: 1 Airway Equipment and Method: Stylet Placement Confirmation: ETT inserted through vocal cords under direct vision, positive ETCO2, CO2 detector and breath sounds checked- equal and bilateral Secured at: 23 cm Tube secured with: Tape Dental Injury: Teeth and Oropharynx as per pre-operative assessment  Comments: ATOI

## 2024-06-06 NOTE — Anesthesia Postprocedure Evaluation (Signed)
"   Anesthesia Post Note  Patient: Benjamin [name]  Procedure(s) Performed: COLECTOMY, SIGMOID, ROBOT-ASSISTED (Abdomen) CREATION, CECOSTOMY PROCTOSCOPY CYSTOSCOPY WITH INDOCYANINE GREEN  IMAGING (ICG)     Patient location during evaluation: PACU Anesthesia Type: General Level of consciousness: awake and alert Pain management: pain level controlled Vital Signs Assessment: post-procedure vital signs reviewed and stable Respiratory status: spontaneous breathing, nonlabored ventilation, respiratory function stable and patient connected to nasal cannula oxygen Cardiovascular status: blood pressure returned to baseline and stable Postop Assessment: no apparent nausea or vomiting Anesthetic complications: no   No notable events documented.  Last Vitals:  Vitals:   06/06/24 1752 06/06/24 1800  BP:  (!) 173/90  Pulse: 97 96  Resp: 11 10  Temp:  (!) 36.1 C  SpO2: 91% 97%    Last Pain:  Vitals:   06/06/24 1837  TempSrc:   PainSc: 10-Worst pain ever                 Garnette DELENA Gab      "

## 2024-06-06 NOTE — Progress Notes (Addendum)
 Patient underwent rectosigmoid resection this afternoon. I spoke to Dr. Signe who is on call for [facility].  She recommended to continue to hold heparin  drip post-op for now and to follow-up with Dr. Mariann team in the morning on 06/07/24 for guidance on if/when heparin  drip can be resumed back after they have assessed Mr. Affeldt surgical site.   Iantha Batch, PharmD, BCPS 06/06/2024 6:38 PM

## 2024-06-07 ENCOUNTER — Encounter (HOSPITAL_COMMUNITY): Payer: Self-pay | Admitting: Surgery

## 2024-06-07 DIAGNOSIS — K5720 Diverticulitis of large intestine with perforation and abscess without bleeding: Secondary | ICD-10-CM | POA: Diagnosis not present

## 2024-06-07 LAB — CBC
HCT: 41.1 % (ref 39.0–52.0)
Hemoglobin: 13.7 g/dL (ref 13.0–17.0)
MCH: 30.1 pg (ref 26.0–34.0)
MCHC: 33.3 g/dL (ref 30.0–36.0)
MCV: 90.3 fL (ref 80.0–100.0)
Platelets: 340 K/uL (ref 150–400)
RBC: 4.55 MIL/uL (ref 4.22–5.81)
RDW: 12.6 % (ref 11.5–15.5)
WBC: 13 K/uL — ABNORMAL HIGH (ref 4.0–10.5)
nRBC: 0 % (ref 0.0–0.2)

## 2024-06-07 LAB — BASIC METABOLIC PANEL WITH GFR
Anion gap: 15 (ref 5–15)
BUN: 14 mg/dL (ref 6–20)
CO2: 19 mmol/L — ABNORMAL LOW (ref 22–32)
Calcium: 9.2 mg/dL (ref 8.9–10.3)
Chloride: 101 mmol/L (ref 98–111)
Creatinine, Ser: 1.14 mg/dL (ref 0.61–1.24)
GFR, Estimated: >60 mL/min
Glucose, Bld: 117 mg/dL — ABNORMAL HIGH (ref 70–99)
Potassium: 4.5 mmol/L (ref 3.5–5.1)
Sodium: 135 mmol/L (ref 135–145)

## 2024-06-07 LAB — MAGNESIUM: Magnesium: 2.5 mg/dL — ABNORMAL HIGH (ref 1.7–2.4)

## 2024-06-07 LAB — HEPARIN LEVEL (UNFRACTIONATED)
Heparin Unfractionated: <0.1 [IU]/mL — ABNORMAL LOW (ref 0.30–0.70)
Heparin Unfractionated: 0.37 [IU]/mL (ref 0.30–0.70)

## 2024-06-07 MED ORDER — CYCLOBENZAPRINE HCL 5 MG PO TABS
5.0000 mg | ORAL_TABLET | Freq: Three times a day (TID) | ORAL | Status: DC | PRN
  Administered 2024-06-07 (×2): 10 mg via ORAL
  Filled 2024-06-07 (×2): qty 2

## 2024-06-07 MED ORDER — HEPARIN (PORCINE) 25000 UT/250ML-% IV SOLN
1600.0000 [IU]/h | INTRAVENOUS | Status: DC
  Administered 2024-06-07 – 2024-06-09 (×4): 1600 [IU]/h via INTRAVENOUS
  Filled 2024-06-07 (×4): qty 250

## 2024-06-07 MED ORDER — IRBESARTAN 150 MG PO TABS
300.0000 mg | ORAL_TABLET | Freq: Every day | ORAL | Status: DC
  Administered 2024-06-07: 300 mg via ORAL
  Filled 2024-06-07: qty 2

## 2024-06-07 MED ORDER — GABAPENTIN 100 MG PO CAPS
200.0000 mg | ORAL_CAPSULE | Freq: Three times a day (TID) | ORAL | Status: DC
  Administered 2024-06-07 – 2024-06-09 (×7): 200 mg via ORAL
  Filled 2024-06-07 (×7): qty 2

## 2024-06-07 MED ORDER — BUTALBITAL-APAP-CAFFEINE 50-325-40 MG PO TABS: 1.0000 | ORAL_TABLET | ORAL | Status: DC | PRN

## 2024-06-07 MED ORDER — LOSARTAN POTASSIUM 50 MG PO TABS
50.0000 mg | ORAL_TABLET | Freq: Every day | ORAL | Status: DC
  Administered 2024-06-07 – 2024-06-09 (×3): 50 mg via ORAL
  Filled 2024-06-07 (×3): qty 1

## 2024-06-07 MED ORDER — ACETAMINOPHEN 500 MG PO TABS
500.0000 mg | ORAL_TABLET | Freq: Three times a day (TID) | ORAL | Status: DC
  Administered 2024-06-07: 500 mg via ORAL
  Filled 2024-06-07 (×5): qty 1

## 2024-06-07 MED ORDER — ACETAMINOPHEN 325 MG PO TABS: 650.0000 mg | ORAL_TABLET | Freq: Four times a day (QID) | ORAL | Status: DC | PRN

## 2024-06-07 MED ORDER — METHOCARBAMOL 500 MG PO TABS: 500.0000 mg | ORAL_TABLET | Freq: Four times a day (QID) | ORAL | Status: DC | PRN

## 2024-06-07 NOTE — Progress Notes (Signed)
 06/07/2024  Benjamin [name] [id number] 01-07-1971  CARE TEAM: PCP: Gretta Ozell CROME, PA-C  Outpatient Care Team: Patient Care Team: Gretta Ozell CROME [name] as PCP - General (Urgent Care) [name], MD as Consulting Physician (Ophthalmology) Buck Saucer, MD as Consulting Physician (Neurology) Ines Onetha NOVAK, MD as Consulting Physician (Neurology) Timmy Maude SAUNDERS, MD as Consulting Physician (Oncology) Franchot Lauraine HERO, NP as Nurse Practitioner (Hematology and Oncology) Sheldon Standing, MD as Consulting Physician (General Surgery) Shila Gustav GAILS, MD as Consulting Physician (Gastroenterology)  Inpatient Treatment Team: Treatment Team:  [place], Eric J, DO [facility], Md, MD Ebbie Cough, MD Kriste Asberry FORBES, RN Timmy Maude SAUNDERS, MD Sheldon Standing, MD Massie Delaine SAUNDERS, RN Ina Charmaine HERO, RN Lorene Lucita GRADE, [place] Bobbette Levins, MD Haig Damien HERO, RN Shade, Wanda FORBES, RPH Purgason, Vina NOVAK, RRT Estelle Hunter DEL, RN   Problem List:   Principal Problem:   Diverticulitis of large intestine with abscess Active Problems:   Essential hypertension   Benign prostatic hyperplasia   History of factor V Leiden mutation   Acute urinary retention   Colonic diverticular abscess   06/06/2024  POST-OPERATIVE DIAGNOSIS:  SIGMOID DIVERTICULITIS WITH CHRONIC ABSCESS   PROCEDURE:   -ROBOTIC LOW ANTERIOR RECTOSIGMOID RESECTION (LAR) WITH ANASTOMOSIS -INTRAOPERATIVE ASSESSMENT OF TISSUE VASCULAR PERFUSION USING ICG (indocyanine green ) IMMUNOFLUORESCENCE,  -TRANSVERSUS ABDOMINIS PLANE (TAP) BLOCK - BILATERAL -FLEXIBLE SIGMOIDOSCOPY   SURGEON:  Standing [name], MD  OR FINDINGS:   Patient had very inflamed sigmoid colon densely adherent to left lower quadrant pelvis and especially the anterior pelvis/bladder wall.  Contained abscess with some chronic phlegmon noted in the anterior peritoneal reflection overlying the distal sigmoid and proximal rectum.   No obvious  metastatic disease on visceral parietal peritoneum or liver.   It is a 29mm EEA anastomosis ( distal descending colon  connected to proximal rectum.)  It rests 14 cm from the anal verge by flexible sigmoidoscopy    Assessment [name] Stay = 6 days) 1 Day Post-Op    Recovering relatively well so far    Plan:  ERAS protocol  Follow-up on pathology.  Seems most likely consistent with chronic diverticulitis  Tolerating liquids.  Slowly advance diet.  The phlegmonous sigmoid colon abscess have been removed.  Remove Foley catheter postop day 1 today per protocol.  Will place PRN (as needed)  Wean off IV fluids per protocol with backup  Hemoglobin looks good and blood pressure stable.  Reasonable to try and reanticoagulate.  Would do this gradually.  If hemoglobin stable x 48 hours and ready for discharge, can transition back to oral Xarelto  anticoagulation.  Blood pressure controlled.  Has PRN (as needed).  Defer to medicine primary service  -monitor electrolytes & replace as needed  Keep K>4, Mg>2, Phos>3  -VTE prophylaxis- SCDs.  Anticoagulation prophyllaxis SQ as appropriate  -mobilize as tolerated to help recovery.  Enlist therapies in moderate/high risk patients as appropriate  I updated the patient's status to the patient and spouse  Recommendations were made.  Questions were answered.  They expressed understanding & appreciation.  -Disposition:  Disposition:  The patient is from: Home Anticipate discharge to:  Home Anticipated Date of Discharge is:  January 9,2026   Barriers to discharge:  Pending Clinical improvement (more likely than not), Testing result pending, and Consultant clearance & sign off    Patient currently is NOT MEDICALLY STABLE for discharge from the hospital from a surgery standpoint.      I reviewed nursing notes, last  24 h vitals and pain scores, last 48 h intake and output, last 24 h labs and trends, and last 24 h imaging results.  I have  reviewed this patient's available data, including medical history, events of note, test results, etc as part of my evaluation.   A significant portion of that time was spent in counseling. Care during the described time interval was provided by me.  This care required moderate level of medical decision making.  06/07/2024    Subjective: (Chief complaint)  Patient with some soreness but feeling much better overall  Tolerating clear liquids.  Wife at bedside.  She is worried about elevated blood pressure  Objective:  Vital signs:  Vitals:   06/06/24 2037 06/06/24 2134 06/07/24 0157 06/07/24 0501  BP: (!) 183/104 (!) 145/112 (!) 138/105 (!) 132/96  Pulse: 89 86 96 74  Resp: 18 18 18 18   Temp: (!) 97.5 F (36.4 C) (!) 97.5 F (36.4 C) 97.6 F (36.4 C) 98 F (36.7 C)  TempSrc: Oral Oral Oral Oral  SpO2: 95% 95% 96% 94%  Weight:      Height:        Last BM Date : 06/05/24  Intake/Output   Yesterday:  01/06 0701 - 01/07 0700 In: 7323.3 [P.O.:240; I.V.:2338.5; IV Piggyback:98.1] Out: 2105 [Urine:1850; Drains:180; Blood:75] This shift:  No intake/output data recorded.  Bowel function:  Flatus: No  BM:  No  Blake pelvic drain: Serosanguinous   Physical Exam:  General: Pt awake/alert in no acute distress.  Sitting up in bed smiling using phone.  Chatty.  Toxic.  Not sickly Eyes: PERRL, normal EOM.  Sclera clear.  No icterus Neuro: CN II-XII intact w/o focal sensory/motor deficits. Lymph: No head/neck/groin lymphadenopathy Psych:  No delerium/psychosis/paranoia.  Oriented x  HENT: Normocephalic, Mucus membranes moist.  No thrush Neck: Supple, No tracheal deviation.  No obvious thyromegaly Chest: No pain to chest wall compression.  Good respiratory excursion.  No audible wheezing CV:  Pulses intact.  Regular rhythm.  No major extremity edema MS: Normal AROM mjr joints.  No obvious deformity  Abdomen: Soft.  Nondistended.  Mildly tender at incisions only.  No  evidence of peritonitis.  No incarcerated hernias.  Ext:  No deformity.  No mjr edema.  No cyanosis Skin: No petechiae / purpurea.  No major sores.  Warm and dry    Results:   Cultures: Recent Results (from the past 720 hours)  MRSA Next Gen by PCR, Nasal     Status: None   Collection Time: 06/05/24 10:12 PM   Specimen: Nasal Mucosa; Nasal Swab  Result Value Ref Range Status   MRSA by PCR Next Gen NOT DETECTED NOT DETECTED Final    Comment: (NOTE) The GeneXpert MRSA Assay (FDA approved for NASAL specimens only), is one component of a comprehensive MRSA colonization surveillance program. It is not intended to diagnose MRSA infection nor to guide or monitor treatment for MRSA infections. Test performance is not FDA approved in patients less than 66 years old. Performed at [facility], [address], [place], [place] [address]     Labs: Results for orders placed or performed during the hospital encounter of 06/01/24 (from the past 48 hours)  MRSA Next Gen by PCR, Nasal     Status: None   Collection Time: 06/05/24 10:12 PM   Specimen: Nasal Mucosa; Nasal Swab  Result Value Ref Range   MRSA by PCR Next Gen NOT DETECTED NOT DETECTED    Comment: (  NOTE) The GeneXpert MRSA Assay (FDA approved for NASAL specimens only), is one component of a comprehensive MRSA colonization surveillance program. It is not intended to diagnose MRSA infection nor to guide or monitor treatment for MRSA infections. Test performance is not FDA approved in patients less than 54 years old. Performed at [facility], [address] [place], [place] [address]   CBC     Status: None   Collection Time: 06/06/24  5:22 AM  Result Value Ref Range   WBC 10.0 4.0 - 10.5 K/uL   RBC 4.95 4.22 - 5.81 MIL/uL   Hemoglobin 15.1 13.0 - 17.0 g/dL   HCT 55.6 60.9 - 47.9 %   MCV 89.5 80.0 - 100.0 fL   MCH 30.5 26.0 - 34.0 pg   MCHC 34.1 30.0 - 36.0 g/dL   RDW 87.3 88.4 - 84.4  %   Platelets 320 150 - 400 K/uL   nRBC 0.0 0.0 - 0.2 %    Comment: Performed at [facility], [address] [place], [place] [address]  Heparin  level (unfractionated)     Status: None   Collection Time: 06/06/24  5:22 AM  Result Value Ref Range   Heparin  Unfractionated 0.32 0.30 - 0.70 IU/mL    Comment: (NOTE) The clinical reportable range upper limit is being lowered to >1.10 to align with the FDA approved guidance for the current laboratory assay.  If heparin  results are below expected values, and patient dosage has  been confirmed, suggest follow up testing of antithrombin III  levels. Performed at [facility], [address], [place], [place] [address]   Basic metabolic panel with GFR     Status: Abnormal   Collection Time: 06/06/24  5:22 AM  Result Value Ref Range   Sodium 137 135 - 145 mmol/L   Potassium 3.8 3.5 - 5.1 mmol/L   Chloride 103 98 - 111 mmol/L   CO2 20 (L) 22 - 32 mmol/L   Glucose, Bld 97 70 - 99 mg/dL    Comment: Glucose reference range applies only to samples taken after fasting for at least 8 hours.   BUN 11 6 - 20 mg/dL   Creatinine, Ser 9.03 0.61 - 1.24 mg/dL   Calcium  9.5 8.9 - 10.3 mg/dL   GFR, Estimated >39 >39 mL/min    Comment: (NOTE) Calculated using the CKD-EPI Creatinine Equation (2021)    Anion gap 14 5 - 15    Comment: Performed at [facility] [address] [place], [place] [address]  Magnesium      Status: Abnormal   Collection Time: 06/06/24  5:22 AM  Result Value Ref Range   Magnesium  2.5 (H) 1.7 - 2.4 mg/dL    Comment: Performed at [facility], [address], [place], [place] [address]  CBC     Status: Abnormal   Collection Time: 06/06/24  6:52 PM  Result Value Ref Range   WBC 14.6 (H) 4.0 - 10.5 K/uL   RBC 4.77 4.22 - 5.81 MIL/uL   Hemoglobin 14.8 13.0 - 17.0 g/dL   HCT 57.4 60.9 - 47.9 %   MCV 89.1 80.0 - 100.0 fL   MCH 31.0 26.0 - 34.0 pg   MCHC  34.8 30.0 - 36.0 g/dL   RDW 87.5 88.4 - 84.4 %   Platelets 327 150 - 400 K/uL   nRBC 0.0 0.0 - 0.2 %    Comment: Performed at [facility], [address]  [place], [place] [address]  Heparin  level (unfractionated)     Status: Abnormal   Collection Time: 06/07/24  5:08 AM  Result Value Ref Range   Heparin  Unfractionated <0.10 (L) 0.30 - 0.70 IU/mL    Comment: (NOTE) The clinical reportable range upper limit is being lowered to >1.10 to align with the FDA approved guidance for the current laboratory assay.  If heparin  results are below expected values, and patient dosage has  been confirmed, suggest follow up testing of antithrombin III  levels. Performed at [facility], [address], [place], [place] [address]   Basic metabolic panel with GFR     Status: Abnormal   Collection Time: 06/07/24  5:08 AM  Result Value Ref Range   Sodium 135 135 - 145 mmol/L   Potassium 4.5 3.5 - 5.1 mmol/L   Chloride 101 98 - 111 mmol/L   CO2 19 (L) 22 - 32 mmol/L   Glucose, Bld 117 (H) 70 - 99 mg/dL    Comment: Glucose reference range applies only to samples taken after fasting for at least 8 hours.   BUN 14 6 - 20 mg/dL   Creatinine, Ser 8.85 0.61 - 1.24 mg/dL   Calcium  9.2 8.9 - 10.3 mg/dL   GFR, Estimated >39 >39 mL/min    Comment: (NOTE) Calculated using the CKD-EPI Creatinine Equation (2021)    Anion gap 15 5 - 15    Comment: Performed at [facility], [address], [place], [place] [address]  CBC     Status: Abnormal   Collection Time: 06/07/24  5:08 AM  Result Value Ref Range   WBC 13.0 (H) 4.0 - 10.5 K/uL   RBC 4.55 4.22 - 5.81 MIL/uL   Hemoglobin 13.7 13.0 - 17.0 g/dL   HCT 58.8 60.9 - 47.9 %   MCV 90.3 80.0 - 100.0 fL   MCH 30.1 26.0 - 34.0 pg   MCHC 33.3 30.0 - 36.0 g/dL   RDW 87.3 88.4 - 84.4 %   Platelets 340 150 - 400 K/uL   nRBC 0.0 0.0 - 0.2 %    Comment: Performed at [facility], [address], [place], [place] [address]  Magnesium      Status: Abnormal   Collection Time: 06/07/24  5:08 AM  Result Value Ref Range   Magnesium  2.5 (H) 1.7 - 2.4 mg/dL    Comment: Performed at [facility], [address] [place], [place] [address]    Imaging / Studies: No results found.  Medications / Allergies: per chart  Antibiotics: Anti-infectives (From admission, onward)    Start     Dose/Rate Route Frequency Ordered Stop   06/06/24 0600  cefoTEtan  (CEFOTAN ) 2 g in sodium chloride  0.9 % 100 mL IVPB        2 g 200 mL/hr over 30 Minutes Intravenous On call to O.R. 06/05/24 0810 06/06/24 1334   06/05/24 1400  neomycin  (MYCIFRADIN ) tablet 1,000 mg       Placed in And Linked Group   1,000 mg Oral 3 times per day 06/05/24 0810 06/05/24 2132   06/05/24 1400  metroNIDAZOLE  (FLAGYL ) tablet 1,000 mg  Status:  Discontinued       Placed in And Linked Group   1,000 mg Oral 3 times per day 06/05/24 0810 06/05/24 0855   06/05/24 1000  fluconazole  (DIFLUCAN ) tablet 200 mg        200 mg Oral Weekly 06/02/24 1355     06/02/24  1430  valACYclovir  (VALTREX ) tablet 500 mg        500 mg Oral Daily 06/02/24 1355     06/02/24 1000  piperacillin -tazobactam (ZOSYN ) IVPB 3.375 g        3.375 g 12.5 mL/hr over 240 Minutes Intravenous Every 8 hours 06/02/24 0951     06/01/24 1800  piperacillin -tazobactam (ZOSYN ) IVPB 3.375 g  Status:  Discontinued        3.375 g 12.5 mL/hr over 240 Minutes Intravenous Every 8 hours 06/01/24 1153 06/01/24 1621   06/01/24 1800  cefTRIAXone  (ROCEPHIN ) 2 g in sodium chloride  0.9 % 100 mL IVPB  Status:  Discontinued        2 g 200 mL/hr over 30 Minutes Intravenous Every 24 hours 06/01/24 1621 06/02/24 0944   06/01/24 1800  metroNIDAZOLE  (FLAGYL ) tablet 500 mg  Status:  Discontinued        500 mg Oral Every 12 hours 06/01/24 1621 06/02/24 0944   06/01/24 1200  piperacillin -tazobactam (ZOSYN ) IVPB 3.375 g        3.375 g 100 mL/hr over 30 Minutes  Intravenous  Once 06/01/24 1151 06/01/24 1229         Note: Portions of this report may have been transcribed using voice recognition software. Every effort was made to ensure accuracy; however, inadvertent computerized transcription errors may be present.   Any transcriptional errors that result from this process are unintentional.    Elspeth KYM Schultze, MD, FACS, MASCRS Esophageal, Gastrointestinal & Colorectal Surgery Robotic and Minimally Invasive Surgery  [facility] [address] [place], [place] [address] [phone] Fax [phone] Main  CONTACT INFORMATION: Weekday (9AM-5PM): Call [facility] main office at [phone] Weeknight (5PM-9AM) or Weekend/Holiday: Check EPIC Web Links tab & use AMION (password  [email or web address]) for General Surgery [facility] coverage  Please, DO NOT use SecureChat  (it is not reliable communication to reach operating surgeons & will lead to a delay in care).   Epic staff messaging available for outpatient concerns needing 1-2 business day response.      06/07/2024  7:43 AM

## 2024-06-07 NOTE — Plan of Care (Signed)
" °  Problem: Education: Goal: Knowledge of General Education information will improve Description: Including pain rating scale, medication(s)/side effects and non-pharmacologic comfort measures Outcome: Progressing   Problem: Health Behavior/Discharge Planning: Goal: Ability to manage health-related needs will improve Outcome: Progressing   Problem: Clinical Measurements: Goal: Ability to maintain clinical measurements within normal limits will improve Outcome: Progressing Goal: Will remain free from infection Outcome: Progressing Goal: Diagnostic test results will improve Outcome: Progressing Goal: Respiratory complications will improve Outcome: Progressing Goal: Cardiovascular complication will be avoided Outcome: Progressing   Problem: Activity: Goal: Risk for activity intolerance will decrease Outcome: Progressing   Problem: Nutrition: Goal: Adequate nutrition will be maintained Outcome: Progressing   Problem: Coping: Goal: Level of anxiety will decrease Outcome: Progressing   Problem: Elimination: Goal: Will not experience complications related to bowel motility Outcome: Progressing Goal: Will not experience complications related to urinary retention Outcome: Progressing   Problem: Pain Managment: Goal: General experience of comfort will improve and/or be controlled Outcome: Progressing   Problem: Safety: Goal: Ability to remain free from injury will improve Outcome: Progressing   Problem: Skin Integrity: Goal: Risk for impaired skin integrity will decrease Outcome: Progressing   Problem: Education: Goal: Understanding of discharge needs will improve Outcome: Progressing Goal: Verbalization of understanding of the causes of altered bowel function will improve Outcome: Progressing   Problem: Activity: Goal: Ability to tolerate increased activity will improve Outcome: Progressing   Problem: Bowel/Gastric: Goal: Gastrointestinal status for postoperative  course will improve Outcome: Progressing   Problem: Health Behavior/Discharge Planning: Goal: Identification of community resources to assist with postoperative recovery needs will improve Outcome: Progressing   Problem: Nutritional: Goal: Will attain and maintain optimal nutritional status will improve Outcome: Progressing   Problem: Clinical Measurements: Goal: Postoperative complications will be avoided or minimized Outcome: Progressing   Problem: Respiratory: Goal: Respiratory status will improve Outcome: Progressing   Problem: Skin Integrity: Goal: Will show signs of wound healing Outcome: Progressing   Problem: Education: Goal: Knowledge of the prescribed therapeutic regimen will improve Outcome: Progressing   Problem: Bowel/Gastric: Goal: Gastrointestinal status for postoperative course will improve Outcome: Progressing   Problem: Cardiac: Goal: Ability to maintain an adequate cardiac output Outcome: Progressing Goal: Will show no evidence of cardiac arrhythmias Outcome: Progressing   Problem: Nutritional: Goal: Will attain and maintain optimal nutritional status Outcome: Progressing   Problem: Neurological: Goal: Will regain or maintain usual level of consciousness Outcome: Progressing   Problem: Clinical Measurements: Goal: Ability to maintain clinical measurements within normal limits Outcome: Progressing Goal: Postoperative complications will be avoided or minimized Outcome: Progressing   Problem: Respiratory: Goal: Will regain and/or maintain adequate ventilation Outcome: Progressing Goal: Respiratory status will improve Outcome: Progressing   Problem: Skin Integrity: Goal: Demonstrates signs of wound healing without infection Outcome: Progressing   Problem: Urinary Elimination: Goal: Will remain free from infection Outcome: Progressing Goal: Ability to achieve and maintain adequate urine output Outcome: Progressing   "

## 2024-06-07 NOTE — Consult Note (Signed)
 WOC Nurse wound follow up Pt did not receive an ostomy yesterday during surgery.  Surgical team is following for assessment and plan of care.  No further role for WOC team.  Please re-consult if further assistance is needed.  Thank-you,  Stephane Fought MSN, RN, CWOCN, CWCN-AP, CNS Contact Mon-Fri 0700-1500: [phone]

## 2024-06-07 NOTE — Progress Notes (Signed)
 PHARMACY - ANTICOAGULATION CONSULT NOTE  Pharmacy Consult for heparin  Indication: factor V leiden / DVT (Xarelto  on hold)  Allergies[1]  Patient Measurements: Height: 5' 9 (175.3 cm) Weight: 87.5 kg (193 lb) IBW/kg (Calculated) : 70.7 HEPARIN  DW (KG): 87.5  Vital Signs: Temp: 98 F (36.7 C) (01/07 0501) Temp Source: Oral (01/07 0501) BP: 132/96 (01/07 0501) Pulse Rate: 74 (01/07 0501)  Labs: Recent Labs    06/05/24 0447 06/06/24 0522 06/06/24 1852 06/07/24 0508  HGB 13.9 15.1 14.8 13.7  HCT 41.5 44.3 42.5 41.1  PLT 277 320 327 340  HEPARINUNFRC 0.43 0.32  --  <0.10*  CREATININE 1.11 0.96  --  1.14    Estimated Creatinine Clearance: 81.1 mL/min (by C-G formula based on SCr of 1.14 mg/dL).   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 Pt admitted with diverticulitis with abscess. Xarelto  on hold pending potential surgical intervention. Pharmacy consulted to dose heparin .  Significant events: 1/6 Heparin  held at 6:15 am prior to surgery at 12:20.  Postop, no heparin  plans specified.  On call surgeon Dr. Signe requests follow up next day.   Today, 06/07/2024: HL < 0.1, Heparin  remains on hold.   CBC: WNL, stable No bleeding or complications reported by RN  Goal of Therapy: Heparin  level 0.3-0.7 units/ml Monitor platelets by anticoagulation protocol: Yes  Plan: Resume Heparin  at 1600 units/hr, NO bolus per Dr. Sheldon. Check 6 hour heparin  level.  CBC, heparin  level daily Monitor for signs of bleeding  Wanda Hasting PharmD, BCPS [facility] main pharmacy [phone] 06/07/2024 7:16 AM         [1]  Allergies Allergen Reactions   Bismuth Subsalicylate Anaphylaxis, Swelling and Other (See Comments)    The throat swells   Compazine  [Prochlorperazine  Edisylate] Anxiety and Other (See Comments)    Cervical dystonia and panic attacks, also

## 2024-06-07 NOTE — Progress Notes (Signed)
 PHARMACY - ANTICOAGULATION CONSULT NOTE  Pharmacy Consult for heparin  Indication: factor V leiden / DVT (Xarelto  on hold)  Allergies[1]  Patient Measurements: Height: 5' 9 (175.3 cm) Weight: 87.5 kg (193 lb) IBW/kg (Calculated) : 70.7 HEPARIN  DW (KG): 87.5  Vital Signs: Temp: 98.2 F (36.8 C) (01/07 1340) Temp Source: Oral (01/07 1340) BP: 135/86 (01/07 1340) Pulse Rate: 90 (01/07 1340)  Labs: Recent Labs    06/05/24 0447 06/06/24 0522 06/06/24 1852 06/07/24 0508 06/07/24 1823  HGB 13.9 15.1 14.8 13.7  --   HCT 41.5 44.3 42.5 41.1  --   PLT 277 320 327 340  --   HEPARINUNFRC 0.43 0.32  --  <0.10* 0.37  CREATININE 1.11 0.96  --  1.14  --     Estimated Creatinine Clearance: 81.1 mL/min (by C-G formula based on SCr of 1.14 mg/dL).   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 Pt admitted with diverticulitis with abscess. Xarelto  on hold pending potential surgical intervention. Pharmacy consulted to dose heparin .  Significant events: 1/6 Heparin  held at 6:15 am prior to surgery at 12:20.  Postop, no heparin  plans specified.  On call surgeon Dr. Signe requests follow up next day.   Today, 06/07/2024, PM: HL therapeutic since restarted this AM at rate of 1600 units/hr CBC: WNL, stable No bleeding or complications reported by RN  Goal of Therapy: Heparin  level 0.3-0.7 units/ml Monitor platelets by anticoagulation protocol: Yes  Plan: Continue IV heparin  at current rate of 1600 units/hr, NO bolus per Dr. Sheldon. Recheck heparin  level in 6 hours for confirmation.  CBC, heparin  level daily Monitor for signs of bleeding   Eva CHRISTELLA Allis, PharmD, BCPS Secure Chat if ?s 06/07/2024 7:06 PM          [1]  Allergies Allergen Reactions   Bismuth Subsalicylate Anaphylaxis, Swelling and Other (See Comments)    The throat swells   Compazine   [Prochlorperazine  Edisylate] Anxiety and Other (See Comments)    Cervical dystonia and panic attacks, also

## 2024-06-07 NOTE — Progress Notes (Addendum)
 " PROGRESS NOTE    [name]  FMW:[id number] DOB: August 20, 1970 DOA: 06/01/2024 PCP: Gretta Ozell CROME, PA-C    Brief Narrative:   Benjamin [name] is a 54 y.o. 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  Onset night prior.  History of recurrent bouts of diverticulitis.  Patient reports he took some Augmentin  he had at home for few days about 1 week ago that resolved his pain.  But since pain recurred overnight.  Has been seen outpatient by [facility] surgery, Dr. Ebb in June 2025.  In the ED, temperature 98.7 F, HR 126, RR 20, BP 191/128, SpO2 98% on room air.  WBC 11.8, hemoglobin 15.7, plate count 645.  Sodium 139, potassium 4.4, chloride 104, CO2 22, glucose 102, BUN 17, creat 1.15.  AST 26, ALT 20, total bilirubin 0.5.  Lipase less than 10.  Urinalysis unrevealing.  CT abdomen/pelvis with contrast with acute diverticulitis involving the mid sigmoid colon with suspected peripherally enhancing intramural abscess measuring approximately 3.0 x 1.9 cm, no evidence of perforation.  Assessment & Plan:   Acute diverticulitis with intramural abscess, recurrent Patient presenting with recurrent left lower quadrant abdominal pain, onset night prior to ED presentation.  But does report recurrence of pain 1 week ago in which she took Augmentin  with resolution of symptoms.  Patient has afebrile with elevated WBC count of 11.8.  CT abdomen/pelvis with acute diverticulitis mid sigmoid colon with intramural abscess measuring 3.0 x 1.9 cm, no evidence of perforation.  General surgery was consulted and patient underwent robotic rectosigmoid resection with anastomosis by Dr. Sheldon on 06/07/2023. -- General Surgery following, appreciate assistance -- WBC 11.8>15.9>9.9>10.3>8.6>10.0>14.6>13.0 -- Zosyn  3.375 g IV q8h  -- Holding  Xarelto ; okay to restart heparin  drip per general surgery today 1/7 -- Hydrocodone -acetaminophen  5/325 mg 1-2 tablets q4h PRN moderate/severe pain -- Dilaudid  0.5-1.0 mg IV every 2 hours as needed severe breakthrough pain not relieved with oral medication -- Clear liquid diet -- LR at 75 mL/h -- Continue to monitor drain output -- Further per general surgery  Acute urinary retention Patient developed urinary retention while boarding in the ED with bladder scan greater than 300 mL retained.  Initial attempts at In-N-Out catheterization unsuccessful by RN, Foley placed coud catheter by ER MD.  Suspect etiology to his obstructive process related to acute inflammatory changes within his abdomen from acute diverticulitis as above.  CT ab/pelvis with no hydronephrosis. -- Tamsulosin  0.4 g p.o. daily -- Oxybutynin  5 mg p.o. every 8 hours as needed bladder spasms -- Voiding trial today  Hypokalemia: Resolved Repleted.  Potassium 4.5 this morning.  History of factor V Leiden/recurrent DVT On Xarelto  outpatient. -- Continue to hold Xarelto  per general surgery -- Heparin  drip, pharmacy consulted for dosing/monitoring; okay to restart per general surgery today  HTN --Continue Cardizem  240 mg p.o. daily -- Start irbesartan  3 mg p.o. daily  (substituted for home olmesartan 40 mg p.o. daily) -- Hydralazine  20 mg p.o. q6h PRN SBP >165  History of headache -- Fioricet  as needed  Depression -- Wellbutrin  450 mg PO daily  GERD -- Protonix  40 mg p.o. twice daily (substituted for home Nexium )   DVT prophylaxis: SCD's Start: 06/06/24 1826 SCD's Start: 06/06/24 1140 SCD's Start: 06/05/24 0811 SCDs Start: 06/01/24 1622    Code Status: Full Code Family Communication: Updated spouse present at bedside  this morning  Disposition Plan:  Level of care: Med-Surg Status is: Inpatient Remains inpatient appropriate because: IV antibiotics, needs further diet advancement, voiding trial today, further  per general surgery    Consultants:  General Surgery  Procedures:  Cystoscopy with indocyanine green  dye into bilateral ureters; Dr. Renda 1/6 Robotic low anterior rectosigmoid resection with anastomosis, Dr. Sheldon 1/6  Antimicrobials:  Ceftriaxone  Metronidazole  Zosyn  1/2>>   Subjective: Patient seen examined bedside, sitting in bedside chair.  Reports pain currently controlled.  Slept well overnight.  Spouse present at bedside.  Seen by general surgery, okay for restarting heparin  drip with close monitoring of hemoglobin.  Will attempt voiding trial today.  Remains on IV biotics and.  Started on clear liquid diet.  Patient and family perseverating regarding his blood pressure, restarted his olmesartan today.  Denies current headache, no dizziness, no chest pain, no palpitations, no fever/chills/night sweats, no nausea/vomiting/diarrhea, no focal weakness, no fatigue, no paresthesia.  No acute events overnight per nursing staff.  Objective: Vitals:   06/06/24 2134 06/07/24 0157 06/07/24 0501 06/07/24 0942  BP: (!) 145/112 (!) 138/105 (!) 132/96 (!) 148/104  Pulse: 86 96 74 86  Resp: 18 18 18 18   Temp: (!) 97.5 F (36.4 C) 97.6 F (36.4 C) 98 F (36.7 C) (!) 97.5 F (36.4 C)  TempSrc: Oral Oral Oral Oral  SpO2: 95% 96% 94% 98%  Weight:      Height:        Intake/Output Summary (Last 24 hours) at 06/07/2024 1118 Last data filed at 06/07/2024 1000 Gross per 24 hour  Intake 2676.58 ml  Output 2540 ml  Net 136.58 ml   Filed Weights   06/01/24 0927  Weight: 87.5 kg    Examination:  Physical Exam: GEN: NAD, alert and oriented x 3, wd/wn HEENT: NCAT, PERRL, EOMI, sclera clear, MMM PULM: CTAB w/o wheezes/crackles, normal respiratory effort on room air CV: RRR w/o M/G/R GI: abd soft, nondistended, slight TTP, surgical incision sites noted, JP drain  noted GU: Foley catheter noted with clear yellow urine in collection bag MSK: no peripheral edema, moves all extremities  independently with preserved muscle strength NEURO: No focal neurologic deficit PSYCH: normal mood/affect Integumentary: dry/intact, no rashes or wounds    Data Reviewed: I have personally reviewed following labs and imaging studies  CBC: Recent Labs  Lab 06/02/24 0451 06/03/24 0510 06/04/24 0410 06/05/24 0447 06/06/24 0522 06/06/24 1852 06/07/24 0508  WBC 15.9*   < > 10.3 8.6 10.0 14.6* 13.0*  NEUTROABS 13.3*  --   --   --   --   --   --   HGB 14.2   < > 14.2 13.9 15.1 14.8 13.7  HCT 41.6   < > 41.8 41.5 44.3 42.5 41.1  MCV 89.1   < > 90.1 90.6 89.5 89.1 90.3  PLT 291   < > 248 277 320 327 340   < > = values in this interval not displayed.   Basic Metabolic Panel: Recent Labs  Lab 06/03/24 0510 06/04/24 0410 06/05/24 0447 06/06/24 0522 06/07/24 0508  NA 137 135 136 137 135  K 3.4* 3.6 3.7 3.8 4.5  CL 101 101 103 103 101  CO2 24 20* 21* 20* 19*  GLUCOSE 97 102* 94 97 117*  BUN 9 8 13 11 14   CREATININE 1.11 1.04 1.11 0.96 1.14  CALCIUM  9.1 9.3 9.1 9.5 9.2  MG  --  2.5* 2.6* 2.5* 2.5*  PHOS  --   --  3.6  --   --    GFR: Estimated Creatinine Clearance: 81.1 mL/min (by C-G formula based on SCr of 1.14 mg/dL). Liver Function Tests: Recent Labs  Lab 06/01/24 0937 06/02/24 0451  AST 26 19  ALT 20 18  ALKPHOS 82 71  BILITOT 0.5 0.8  PROT 8.0 7.0  ALBUMIN 4.6 4.1   Recent Labs  Lab 06/01/24 0937  LIPASE <10*   No results for input(s): AMMONIA in the last 168 hours. Coagulation Profile: No results for input(s): INR, PROTIME in the last 168 hours. Cardiac Enzymes: No results for input(s): CKTOTAL, CKMB, CKMBINDEX, TROPONINI in the last 168 hours. BNP (last 3 results) No results for input(s): PROBNP in the last 8760 hours. HbA1C: Recent Labs    06/05/24 0447  HGBA1C 5.3   CBG: No results for input(s): GLUCAP in the last 168 hours. Lipid Profile: No results for input(s): CHOL, HDL, LDLCALC, TRIG, CHOLHDL, LDLDIRECT in  the last 72 hours. Thyroid  Function Tests: No results for input(s): TSH, T4TOTAL, FREET4, T3FREE, THYROIDAB in the last 72 hours. Anemia Panel: No results for input(s): VITAMINB12, FOLATE, FERRITIN, TIBC, IRON, RETICCTPCT in the last 72 hours. Sepsis Labs: No results for input(s): PROCALCITON, LATICACIDVEN in the last 168 hours.  Recent Results (from the past 240 hours)  MRSA Next Gen by PCR, Nasal     Status: None   Collection Time: 06/05/24 10:12 PM   Specimen: Nasal Mucosa; Nasal Swab  Result Value Ref Range Status   MRSA by PCR Next Gen NOT DETECTED NOT DETECTED Final    Comment: (NOTE) The GeneXpert MRSA Assay (FDA approved for NASAL specimens only), is one component of a comprehensive MRSA colonization surveillance program. It is not intended to diagnose MRSA infection nor to guide or monitor treatment for MRSA infections. Test performance is not FDA approved in patients less than 68 years old. Performed at [facility], [address], [place], [place] [address]          Radiology Studies: No results found.       Scheduled Meds:  alvimopan   12 mg Oral BID   buPROPion   450 mg Oral Daily   Chlorhexidine  Gluconate Cloth  6 each Topical Daily   diltiazem   240 mg Oral Daily   feeding supplement  1 Container Oral BID BM   feeding supplement  592 mL Oral Once   feeding supplement  237 mL Oral BID BM   fluconazole   200 mg Oral Weekly   gabapentin   200 mg Oral QHS   irbesartan   300 mg Oral Daily   pantoprazole   40 mg Oral BID   polyethylene glycol  17 g Oral Daily   rosuvastatin   40 mg Oral Daily   scopolamine   1 patch Transdermal Q72H   tamsulosin   0.4 mg Oral Daily   valACYclovir   500 mg Oral Daily   Continuous Infusions:  sodium chloride      lactated ringers  Stopped (06/06/24 1827)   piperacillin -tazobactam (ZOSYN )  IV 3.375 g (06/07/24 0508)     LOS: 6 days    Time spent: 48 minutes spent on 06/07/2024 caring  for this patient face-to-face including chart review, ordering labs/tests, documenting, discussion with nursing staff, consultants, updating family and interview/physical exam    Camellia PARAS Kinsie Belford, DO [facility] Available via Epic secure chat 7am-7pm After these hours, please refer to coverage provider listed on [email or web address] 06/07/2024, 11:18 AM   "

## 2024-06-08 ENCOUNTER — Ambulatory Visit: Payer: Self-pay | Admitting: Surgery

## 2024-06-08 DIAGNOSIS — K5720 Diverticulitis of large intestine with perforation and abscess without bleeding: Secondary | ICD-10-CM | POA: Diagnosis not present

## 2024-06-08 LAB — CBC
HCT: 40.7 % (ref 39.0–52.0)
Hemoglobin: 13.5 g/dL (ref 13.0–17.0)
MCH: 30.4 pg (ref 26.0–34.0)
MCHC: 33.2 g/dL (ref 30.0–36.0)
MCV: 91.7 fL (ref 80.0–100.0)
Platelets: 310 K/uL (ref 150–400)
RBC: 4.44 MIL/uL (ref 4.22–5.81)
RDW: 12.7 % (ref 11.5–15.5)
WBC: 12 K/uL — ABNORMAL HIGH (ref 4.0–10.5)
nRBC: 0 % (ref 0.0–0.2)

## 2024-06-08 LAB — SURGICAL PATHOLOGY

## 2024-06-08 LAB — BASIC METABOLIC PANEL WITH GFR
Anion gap: 12 (ref 5–15)
BUN: 17 mg/dL (ref 6–20)
CO2: 25 mmol/L (ref 22–32)
Calcium: 9.2 mg/dL (ref 8.9–10.3)
Chloride: 99 mmol/L (ref 98–111)
Creatinine, Ser: 1.3 mg/dL — ABNORMAL HIGH (ref 0.61–1.24)
GFR, Estimated: >60 mL/min
Glucose, Bld: 115 mg/dL — ABNORMAL HIGH (ref 70–99)
Potassium: 3.4 mmol/L — ABNORMAL LOW (ref 3.5–5.1)
Sodium: 135 mmol/L (ref 135–145)

## 2024-06-08 LAB — HEMOGLOBIN AND HEMATOCRIT, BLOOD
HCT: 41.8 % (ref 39.0–52.0)
HCT: 40.1 % (ref 39.0–52.0)
HCT: 38.5 % — ABNORMAL LOW (ref 39.0–52.0)
Hemoglobin: 13.8 g/dL (ref 13.0–17.0)
Hemoglobin: 13.6 g/dL (ref 13.0–17.0)
Hemoglobin: 12.8 g/dL — ABNORMAL LOW (ref 13.0–17.0)

## 2024-06-08 LAB — HEPARIN LEVEL (UNFRACTIONATED): Heparin Unfractionated: 0.46 [IU]/mL (ref 0.30–0.70)

## 2024-06-08 MED ORDER — POTASSIUM CHLORIDE CRYS ER 20 MEQ PO TBCR
40.0000 meq | EXTENDED_RELEASE_TABLET | Freq: Every day | ORAL | Status: DC
  Administered 2024-06-08 – 2024-06-09 (×2): 40 meq via ORAL
  Filled 2024-06-08 (×2): qty 2

## 2024-06-08 MED ORDER — LACTATED RINGERS IV BOLUS: 1000.0000 mL | Freq: Three times a day (TID) | INTRAVENOUS | Status: DC | PRN

## 2024-06-08 MED ORDER — CALCIUM POLYCARBOPHIL 625 MG PO TABS
625.0000 mg | ORAL_TABLET | Freq: Two times a day (BID) | ORAL | Status: DC
  Administered 2024-06-08 – 2024-06-09 (×3): 625 mg via ORAL
  Filled 2024-06-08 (×2): qty 1

## 2024-06-08 NOTE — Plan of Care (Signed)
  Problem: Education: Goal: Knowledge of General Education information will improve Description: Including pain rating scale, medication(s)/side effects and non-pharmacologic comfort measures Outcome: Progressing   Problem: Coping: Goal: Level of anxiety will decrease Outcome: Progressing   Problem: Activity: Goal: Ability to tolerate increased activity will improve Outcome: Progressing

## 2024-06-08 NOTE — Progress Notes (Signed)
 " PROGRESS NOTE    Benjamin [name]  FMW:[id number] DOB: 1971-04-03 DOA: 06/01/2024 PCP: Gretta Ozell CROME, PA-C    Brief Narrative:   Benjamin [name] is a 54 y.o. 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  Onset night prior.  History of recurrent bouts of diverticulitis.  Patient reports he took some Augmentin  he had at home for few days about 1 week ago that resolved his pain.  But since pain recurred overnight.  Has been seen outpatient by [facility], Dr. Ebb in June 2025.  In the ED, temperature 98.7 F, HR 126, RR 20, BP 191/128, SpO2 98% on room air.  WBC 11.8, hemoglobin 15.7, plate count 645.  Sodium 139, potassium 4.4, chloride 104, CO2 22, glucose 102, BUN 17, creat 1.15.  AST 26, ALT 20, total bilirubin 0.5.  Lipase less than 10.  Urinalysis unrevealing.  CT abdomen/pelvis with contrast with acute diverticulitis involving the mid sigmoid colon with suspected peripherally enhancing intramural abscess measuring approximately 3.0 x 1.9 cm, no evidence of perforation.  Assessment & Plan:   Acute diverticulitis with intramural abscess, recurrent Patient presenting with recurrent left lower quadrant abdominal pain, onset night prior to ED presentation.  But does report recurrence of pain 1 week ago in which she took Augmentin  with resolution of symptoms.  Patient has afebrile with elevated WBC count of 11.8.  CT abdomen/pelvis with acute diverticulitis mid sigmoid colon with intramural abscess measuring 3.0 x 1.9 cm, no evidence of perforation.  General surgery was consulted and patient underwent robotic rectosigmoid resection with anastomosis by Dr. Sheldon on 06/07/2023. -- General Surgery following, appreciate assistance -- WBC 11.8>15.9>9.9>10.3>8.6>10.0>14.6>13.0>12.0 -- Zosyn  3.375 g IV q8h  --  Holding Xarelto ; restarted heparin  drip 1/7 -- Hydrocodone -acetaminophen  5/325 mg 1-2 tablets q4h PRN moderate/severe pain -- Dilaudid  0.5-1.0 mg IV every 2 hours as needed severe breakthrough pain not relieved with oral medication -- Advance to regular diet today -- Continue to monitor drain output; 60 mL p24h, also some oozing of blood at insertion site contained in dressing -- Further per general surgery  Acute urinary retention Patient developed urinary retention while boarding in the ED with bladder scan greater than 300 mL retained.  Initial attempts at In-N-Out catheterization unsuccessful by RN, Foley placed coud catheter by ER MD.  Suspect etiology to his obstructive process related to acute inflammatory changes within his abdomen from acute diverticulitis as above.  CT ab/pelvis with no hydronephrosis. -- Tamsulosin  0.4 g p.o. daily -- Oxybutynin  5 mg p.o. every 8 hours as needed bladder spasms -- Foley discontinued 1/7, continue to monitor urine output  Hypokalemia: Resolved Repleted.    History of factor V Leiden/recurrent DVT On Xarelto  outpatient. -- Continue to hold Xarelto  per general surgery -- Heparin  drip, pharmacy consulted for dosing/monitoring  HTN --Continue Cardizem  240 mg p.o. daily -- Start irbesartan  3 mg p.o. daily  (substituted for home olmesartan 40 mg p.o. daily) -- Hydralazine  20 mg p.o. q6h PRN SBP >165  History of headache -- Fioricet  as needed  Depression -- Wellbutrin  450 mg PO daily  GERD -- Protonix  40 mg p.o. twice daily (substituted for home Nexium )   DVT prophylaxis: SCDs Start: 06/01/24 1622    Code Status: Full Code Family Communication: Updated spouse present at bedside this morning  Disposition Plan:  Level of care: Med-Surg Status is:  Inpatient Remains inpatient appropriate because: IV antibiotics, diet advanced today, further per general surgery    Consultants:  General Surgery  Procedures:  Cystoscopy with indocyanine  green dye into bilateral ureters; Dr. Renda 1/6 Robotic low anterior rectosigmoid resection with anastomosis, Dr. Sheldon 1/6  Antimicrobials:  Ceftriaxone  Metronidazole  Zosyn  1/2>>   Subjective: Patient seen examined bedside, sitting in bedside chair.  Spouse present.  Concerned about oozing of blood surrounding drain insertion site that is contained in the dressing.  Was restarted on heparin  drip yesterday, hemoglobin currently stable.  Discussed will continue to monitor hemoglobin closely and wait for further recommendations from general surgery.  Diet advanced to regular.  Ambulating and denies any abdominal discomfort at this time.  Denies headache, no dizziness, no chest pain, no palpitations, no fever/chills/night sweats, no nausea/vomiting/diarrhea, no focal weakness, no fatigue, no paresthesia.  No acute events overnight per nursing staff.  Objective: Vitals:   06/07/24 0942 06/07/24 1340 06/07/24 2034 06/08/24 0442  BP: (!) 148/104 135/86 (!) 132/99 (!) 136/97  Pulse: 86 90 93 91  Resp: 18 18 18 18   Temp: (!) 97.5 F (36.4 C) 98.2 F (36.8 C) 98 F (36.7 C) 97.7 F (36.5 C)  TempSrc: Oral Oral Oral Oral  SpO2: 98% 95% 96% 97%  Weight:      Height:        Intake/Output Summary (Last 24 hours) at 06/08/2024 1029 Last data filed at 06/08/2024 1007 Gross per 24 hour  Intake 2435.73 ml  Output 2100 ml  Net 335.73 ml   Filed Weights   06/01/24 0927 06/07/24 0504  Weight: 87.5 kg 85.5 kg    Examination:  Physical Exam: GEN: NAD, alert and oriented x 3, wd/wn HEENT: NCAT, PERRL, EOMI, sclera clear, MMM PULM: CTAB w/o wheezes/crackles, normal respiratory effort on room air CV: RRR w/o M/G/R GI: abd soft, nondistended, slight TTP, surgical incision sites noted, JP drain noted with some oozing of blood that is contained surrounding insertion site within dressing MSK: no peripheral edema, moves all extremities independently with preserved muscle strength NEURO: No focal  neurologic deficit PSYCH: normal mood/affect Integumentary: dry/intact, no rashes or wounds    Data Reviewed: I have personally reviewed following labs and imaging studies  CBC: Recent Labs  Lab 06/02/24 0451 06/03/24 0510 06/05/24 0447 06/06/24 0522 06/06/24 1852 06/07/24 0508 06/08/24 0049  WBC 15.9*   < > 8.6 10.0 14.6* 13.0* 12.0*  NEUTROABS 13.3*  --   --   --   --   --   --   HGB 14.2   < > 13.9 15.1 14.8 13.7 13.5  HCT 41.6   < > 41.5 44.3 42.5 41.1 40.7  MCV 89.1   < > 90.6 89.5 89.1 90.3 91.7  PLT 291   < > 277 320 327 340 310   < > = values in this interval not displayed.   Basic Metabolic Panel: Recent Labs  Lab 06/04/24 0410 06/05/24 0447 06/06/24 0522 06/07/24 0508 06/08/24 0049  NA 135 136 137 135 135  K 3.6 3.7 3.8 4.5 3.4*  CL 101 103 103 101 99  CO2 20* 21* 20* 19* 25  GLUCOSE 102* 94 97 117* 115*  BUN 8 13 11 14 17   CREATININE 1.04 1.11 0.96 1.14 1.30*  CALCIUM  9.3 9.1 9.5 9.2 9.2  MG 2.5* 2.6* 2.5* 2.5*  --   PHOS  --  3.6  --   --   --    GFR: Estimated Creatinine Clearance:  70.4 mL/min (A) (by C-G formula based on SCr of 1.3 mg/dL (H)). Liver Function Tests: Recent Labs  Lab 06/02/24 0451  AST 19  ALT 18  ALKPHOS 71  BILITOT 0.8  PROT 7.0  ALBUMIN 4.1   No results for input(s): LIPASE, AMYLASE in the last 168 hours.  No results for input(s): AMMONIA in the last 168 hours. Coagulation Profile: No results for input(s): INR, PROTIME in the last 168 hours. Cardiac Enzymes: No results for input(s): CKTOTAL, CKMB, CKMBINDEX, TROPONINI in the last 168 hours. BNP (last 3 results) No results for input(s): PROBNP in the last 8760 hours. HbA1C: No results for input(s): HGBA1C in the last 72 hours.  CBG: No results for input(s): GLUCAP in the last 168 hours. Lipid Profile: No results for input(s): CHOL, HDL, LDLCALC, TRIG, CHOLHDL, LDLDIRECT in the last 72 hours. Thyroid  Function Tests: No results  for input(s): TSH, T4TOTAL, FREET4, T3FREE, THYROIDAB in the last 72 hours. Anemia Panel: No results for input(s): VITAMINB12, FOLATE, FERRITIN, TIBC, IRON, RETICCTPCT in the last 72 hours. Sepsis Labs: No results for input(s): PROCALCITON, LATICACIDVEN in the last 168 hours.  Recent Results (from the past 240 hours)  MRSA Next Gen by PCR, Nasal     Status: None   Collection Time: 06/05/24 10:12 PM   Specimen: Nasal Mucosa; Nasal Swab  Result Value Ref Range Status   MRSA by PCR Next Gen NOT DETECTED NOT DETECTED Final    Comment: (NOTE) The GeneXpert MRSA Assay (FDA approved for NASAL specimens only), is one component of a comprehensive MRSA colonization surveillance program. It is not intended to diagnose MRSA infection nor to guide or monitor treatment for MRSA infections. Test performance is not FDA approved in patients less than 43 years old. Performed at [facility], [address], [place], [place] [address]          Radiology Studies: No results found.       Scheduled Meds:  acetaminophen   500 mg Oral TID WC & HS   alvimopan   12 mg Oral BID   buPROPion   450 mg Oral Daily   Chlorhexidine  Gluconate Cloth  6 each Topical Daily   diltiazem   240 mg Oral Daily   feeding supplement  1 Container Oral BID BM   feeding supplement  592 mL Oral Once   feeding supplement  237 mL Oral BID BM   fluconazole   200 mg Oral Weekly   gabapentin   200 mg Oral TID   losartan   50 mg Oral Daily   pantoprazole   40 mg Oral BID   polycarbophil  625 mg Oral BID   potassium chloride   40 mEq Oral Daily   rosuvastatin   40 mg Oral Daily   scopolamine   1 patch Transdermal Q72H   tamsulosin   0.4 mg Oral Daily   valACYclovir   500 mg Oral Daily   Continuous Infusions:  heparin  1,600 Units/hr (06/08/24 0439)   lactated ringers      piperacillin -tazobactam (ZOSYN )  IV 3.375 g (06/08/24 0434)     LOS: 7 days    Time spent: 48 minutes spent  on 06/08/2024 caring for this patient face-to-face including chart review, ordering labs/tests, documenting, discussion with nursing staff, consultants, updating family and interview/physical exam    Camellia PARAS Ninoska Goswick, DO [facility] Available via Epic secure chat 7am-7pm After these hours, please refer to coverage provider listed on [email or web address] 06/08/2024, 10:29 AM   "

## 2024-06-08 NOTE — Progress Notes (Signed)
 PHARMACY - ANTICOAGULATION CONSULT NOTE  Pharmacy Consult for heparin  Indication: factor V leiden / DVT (Xarelto  on hold)  Allergies[1]  Patient Measurements: Height: 5' 9 (175.3 cm) Weight: 87.5 kg (193 lb) IBW/kg (Calculated) : 70.7 HEPARIN  DW (KG): 87.5  Vital Signs: Temp: 98 F (36.7 C) (01/07 2034) Temp Source: Oral (01/07 2034) BP: 132/99 (01/07 2034) Pulse Rate: 93 (01/07 2034)  Labs: Recent Labs    06/05/24 0447 06/06/24 0522 06/06/24 1852 06/07/24 0508 06/07/24 1823 06/08/24 0049  HGB 13.9 15.1 14.8 13.7  --  13.5  HCT 41.5 44.3 42.5 41.1  --  40.7  PLT 277 320 327 340  --  310  HEPARINUNFRC 0.43 0.32  --  <0.10* 0.37 0.46  CREATININE 1.11 0.96  --  1.14  --   --     Estimated Creatinine Clearance: 81.1 mL/min (by C-G formula based on SCr of 1.14 mg/dL).   Medications: PTA Xarelto  -Last dose: 12/31 AM  Assessment: Pt is a 49 yoM who takes Xarelto  PTA for history of  FV Leiden with history of multiple DVTs. Pt admitted with diverticulitis with abscess. Xarelto  on hold pending potential surgical intervention. Pharmacy consulted to dose heparin .  Significant events: 1/6 Heparin  held at 6:15 am prior to surgery at 12:20.  Postop, no heparin  plans specified.  On call surgeon Dr. Signe requests follow up next day.   Today, 06/08/2024, PM: Confirmatory heparin  level 0.46 on 1600 units/hr CBC WNL No bleeding noted  Goal of Therapy: Heparin  level 0.3-0.7 units/ml Monitor platelets by anticoagulation protocol: Yes  Plan: Continue IV heparin  at current rate of 1600 units/hr, NO bolus per Dr. Sheldon. CBC, heparin  level daily Monitor for signs of bleeding   Leeroy Mace RPh 06/08/2024, 1:23 AM           [1]  Allergies Allergen Reactions   Bismuth Subsalicylate Anaphylaxis, Swelling and Other (See Comments)    The throat swells   Compazine  [Prochlorperazine  Edisylate] Anxiety and Other (See Comments)    Cervical dystonia and panic attacks,  also

## 2024-06-08 NOTE — Progress Notes (Signed)
 Benjamin [name] [id number] 1970/06/30   CARE TEAM: PCP: Gretta Ozell CROME, PA-C   Outpatient Care Team: Patient Care Team: Gretta Ozell CROME [name] as PCP - General (Urgent Care) [name], MD as Consulting Physician (Ophthalmology) Buck Saucer, MD as Consulting Physician (Neurology) Ines Onetha NOVAK, MD as Consulting Physician (Neurology) Timmy Maude SAUNDERS, MD as Consulting Physician (Oncology) Franchot Lauraine HERO, NP as Nurse Practitioner (Hematology and Oncology) Sheldon Standing, MD as Consulting Physician (General Surgery) Shila Gustav GAILS, MD as Consulting Physician (Gastroenterology)   Inpatient Treatment Team: Treatment Team:  [place], Eric J, DO [facility], Md, MD Ebbie Cough, MD Kriste Asberry FORBES, RN Timmy Maude SAUNDERS, MD Sheldon Standing, MD Massie Delaine SAUNDERS, RN Ina Charmaine HERO, RN Lorene Lucita GRADE, [place] Bobbette Levins, MD Haig Damien HERO, RN Shade, Wanda FORBES, RPH Purgason, Vina NOVAK, RRT Estelle Hunter DEL, RN     Problem List:    Principal Problem:   Diverticulitis of large intestine with abscess Active Problems:   Essential hypertension   Benign prostatic hyperplasia   History of factor V Leiden mutation   Acute urinary retention   Colonic diverticular abscess     06/06/2024   POST-OPERATIVE DIAGNOSIS:  SIGMOID DIVERTICULITIS WITH CHRONIC ABSCESS   PROCEDURE:   -ROBOTIC LOW ANTERIOR RECTOSIGMOID RESECTION (LAR) WITH ANASTOMOSIS -INTRAOPERATIVE ASSESSMENT OF TISSUE VASCULAR PERFUSION USING ICG (indocyanine green ) IMMUNOFLUORESCENCE,  -TRANSVERSUS ABDOMINIS PLANE (TAP) BLOCK - BILATERAL -FLEXIBLE SIGMOIDOSCOPY   SURGEON:  Standing [name], MD   OR FINDINGS:   Patient had very inflamed sigmoid colon densely adherent to left lower quadrant pelvis and especially the anterior pelvis/bladder wall.  Contained abscess with some chronic phlegmon noted in the anterior peritoneal reflection overlying the distal sigmoid and proximal rectum.   No obvious  metastatic disease on visceral parietal peritoneum or liver.   It is a 29mm EEA anastomosis ( distal descending colon  connected to proximal rectum.)  It rests 14 cm from the anal verge by flexible sigmoidoscopy     Assessment [facility] Stay = 6 days) 2 Day Post-Op     Recovering relatively well.  No abdominal distention or tenderness. Dressing is clean dry and intact. JP drainage  Serosanguinous fluid  Plan:   ERAS protocol   Follow-up on pathology.  Seems most likely consistent with chronic diverticulitis   Tolerating normal diet well - continue normal diet.   Patient is voiding normally; Foley PRN   Wean off IV fluids per protocol with backup   Hemoglobin looks good and blood pressure stable.Continue Anticoagulate.   If hemoglobin stable x 48 hours and ready for discharge, can transition back to oral Xarelto  anticoagulation.   Blood pressure controlled.  Has PRN (as needed).  Defer to medicine primary service   -monitor electrolytes & replace as needed  Keep K>4, Mg>2, Phos>3   -VTE prophylaxis- SCDs.  Anticoagulation prophyllaxis SQ as appropriate   -mobilize as tolerated to help recovery.  Enlist therapies in moderate/high risk patients as appropriate    updated the patient's status to the patient and spouse  Continue with ambulation and normal diet. Questions were answered.  They expressed understanding & appreciation.   -Disposition:  Disposition:  The patient is from: Home Anticipate discharge to:  Home Anticipated Date of Discharge is:  January 9,2026   Barriers to discharge:  Pending Clinical improvement (more likely than not), Testing result pending, and Consultant clearance & sign off     Patient currently is NOT MEDICALLY STABLE for discharge from the hospital from  a surgery standpoint.           I reviewed nursing notes, last 24 h vitals and pain scores, last 48 h intake and output, last 24 h labs and trends, and last 24 h imaging results.  I have  reviewed this patient's available data, including medical history, events of note, test results, etc as part of my evaluation.   A significant portion of that time was spent in counseling. Care during the described time interval was provided by me.   This care required moderate level of medical decision making.  06/07/2024       Subjective: (Chief complaint)   Patient with some soreness at incision site but feeling much better overall   Tolerating regular diet well   Wife at bedside.   Patient concerned about difficulty starting stream when urinating  Has not farted or had a bowel movement  Reports burping   Objective:   Vital signs:         Vitals:    06/06/24 2037 06/06/24 2134 06/07/24 0157 06/07/24 0501  BP: (!) 183/104 (!) 145/112 (!) 138/105 (!) 132/96  Pulse: 89 86 96 74  Resp: 18 18 18 18   Temp: (!) 97.5 F (36.4 C) (!) 97.5 F (36.4 C) 97.6 F (36.4 C) 98 F (36.7 C)  TempSrc: Oral Oral Oral Oral  SpO2: 95% 95% 96% 94%  Weight:          Height:              Last BM Date : 06/05/24   Intake/Output    Yesterday:              01/06 0701 - 01/07 0700 In: 7323.3 [P.O.:240; I.V.:2338.5; IV Piggyback:98.1] Out: 2105 [Urine:1850; Drains:180; Blood:75] This shift:              No intake/output data recorded.   Bowel function:              Flatus: No              BM:  No              Blake pelvic drain: Serosanguinous     Physical Exam:   General: Pt awake/alert in no acute distress.  Sitting up in bed smiling using phone.  Chatty.  Toxic.  Not sickly Eyes: PERRL, normal EOM.  Sclera clear.  No icterus Neuro: CN II-XII intact w/o focal sensory/motor deficits. Lymph: No head/neck/groin lymphadenopathy Psych:  No delerium/psychosis/paranoia.  Oriented x  HENT: Normocephalic, Mucus membranes moist.  No thrush Neck: Supple, No tracheal deviation.  No obvious thyromegaly Chest: No pain to chest wall compression.  Good respiratory excursion.  No audible  wheezing CV:  Pulses intact.  Regular rhythm.  No major extremity edema MS: Normal AROM mjr joints.  No obvious deformity   Abdomen: Soft.  Nondistended.  Mildly tender at incisions only.  No evidence of peritonitis.  No incarcerated hernias.   Ext:  No deformity.  No mjr edema.  No cyanosis Skin: No petechiae / purpurea.  No major sores.  Warm and dry       Results:    Cultures:        Recent Results (from the past 720 hours)  MRSA Next Gen by PCR, Nasal     Status: None    Collection Time: 06/05/24 10:12 PM    Specimen: Nasal Mucosa; Nasal Swab  Result Value Ref Range Status    MRSA by  PCR Next Gen NOT DETECTED NOT DETECTED Final      Comment: (NOTE) The GeneXpert MRSA Assay (FDA approved for NASAL specimens only), is one component of a comprehensive MRSA colonization surveillance program. It is not intended to diagnose MRSA infection nor to guide or monitor treatment for MRSA infections. Test performance is not FDA approved in patients less than 35 years old. Performed at [facility], [address] [place], [place] [address]        Labs: Lab Results Last 48 Hours        Results for orders placed or performed during the hospital encounter of 06/01/24 (from the past 48 hours)  MRSA Next Gen by PCR, Nasal     Status: None    Collection Time: 06/05/24 10:12 PM    Specimen: Nasal Mucosa; Nasal Swab  Result Value Ref Range    MRSA by PCR Next Gen NOT DETECTED NOT DETECTED      Comment: (NOTE) The GeneXpert MRSA Assay (FDA approved for NASAL specimens only), is one component of a comprehensive MRSA colonization surveillance program. It is not intended to diagnose MRSA infection nor to guide or monitor treatment for MRSA infections. Test performance is not FDA approved in patients less than 65 years old. Performed at [facility], [address], [place], [place] [address]    CBC     Status: None    Collection Time: 06/06/24  5:22  AM  Result Value Ref Range    WBC 10.0 4.0 - 10.5 K/uL    RBC 4.95 4.22 - 5.81 MIL/uL    Hemoglobin 15.1 13.0 - 17.0 g/dL    HCT 55.6 60.9 - 47.9 %    MCV 89.5 80.0 - 100.0 fL    MCH 30.5 26.0 - 34.0 pg    MCHC 34.1 30.0 - 36.0 g/dL    RDW 87.3 88.4 - 84.4 %    Platelets 320 150 - 400 K/uL    nRBC 0.0 0.0 - 0.2 %      Comment: Performed at [facility], [address], [place], [place] [address]  Heparin  level (unfractionated)     Status: None    Collection Time: 06/06/24  5:22 AM  Result Value Ref Range    Heparin  Unfractionated 0.32 0.30 - 0.70 IU/mL      Comment: (NOTE) The clinical reportable range upper limit is being lowered to >1.10 to align with the FDA approved guidance for the current laboratory assay.   If heparin  results are below expected values, and patient dosage has  been confirmed, suggest follow up testing of antithrombin III  levels. Performed at [facility], [address], [place], [place] [address]    Basic metabolic panel with GFR     Status: Abnormal    Collection Time: 06/06/24  5:22 AM  Result Value Ref Range    Sodium 137 135 - 145 mmol/L    Potassium 3.8 3.5 - 5.1 mmol/L    Chloride 103 98 - 111 mmol/L    CO2 20 (L) 22 - 32 mmol/L    Glucose, Bld 97 70 - 99 mg/dL      Comment: Glucose reference range applies only to samples taken after fasting for at least 8 hours.    BUN 11 6 - 20 mg/dL    Creatinine, Ser 9.03 0.61 - 1.24 mg/dL    Calcium  9.5 8.9 - 10.3 mg/dL    GFR, Estimated >39 >39 mL/min  Comment: (NOTE) Calculated using the CKD-EPI Creatinine Equation (2021)      Anion gap 14 5 - 15      Comment: Performed at [facility], [address], [place], [place] [address]  Magnesium      Status: Abnormal    Collection Time: 06/06/24  5:22 AM  Result Value Ref Range    Magnesium  2.5 (H) 1.7 - 2.4 mg/dL      Comment: Performed at [facility], [address]  [place], [place] [address]  CBC     Status: Abnormal    Collection Time: 06/06/24  6:52 PM  Result Value Ref Range    WBC 14.6 (H) 4.0 - 10.5 K/uL    RBC 4.77 4.22 - 5.81 MIL/uL    Hemoglobin 14.8 13.0 - 17.0 g/dL    HCT 57.4 60.9 - 47.9 %    MCV 89.1 80.0 - 100.0 fL    MCH 31.0 26.0 - 34.0 pg    MCHC 34.8 30.0 - 36.0 g/dL    RDW 87.5 88.4 - 84.4 %    Platelets 327 150 - 400 K/uL    nRBC 0.0 0.0 - 0.2 %      Comment: Performed at [facility], [address] [place], [place] [address]  Heparin  level (unfractionated)     Status: Abnormal    Collection Time: 06/07/24  5:08 AM  Result Value Ref Range    Heparin  Unfractionated <0.10 (L) 0.30 - 0.70 IU/mL      Comment: (NOTE) The clinical reportable range upper limit is being lowered to >1.10 to align with the FDA approved guidance for the current laboratory assay.   If heparin  results are below expected values, and patient dosage has  been confirmed, suggest follow up testing of antithrombin III  levels. Performed at [facility], [address], [place], [place] [address]    Basic metabolic panel with GFR     Status: Abnormal    Collection Time: 06/07/24  5:08 AM  Result Value Ref Range    Sodium 135 135 - 145 mmol/L    Potassium 4.5 3.5 - 5.1 mmol/L    Chloride 101 98 - 111 mmol/L    CO2 19 (L) 22 - 32 mmol/L    Glucose, Bld 117 (H) 70 - 99 mg/dL      Comment: Glucose reference range applies only to samples taken after fasting for at least 8 hours.    BUN 14 6 - 20 mg/dL    Creatinine, Ser 8.85 0.61 - 1.24 mg/dL    Calcium  9.2 8.9 - 10.3 mg/dL    GFR, Estimated >39 >39 mL/min      Comment: (NOTE) Calculated using the CKD-EPI Creatinine Equation (2021)      Anion gap 15 5 - 15      Comment: Performed at [facility], [address], [place], [place] [address]  CBC     Status: Abnormal    Collection Time: 06/07/24  5:08 AM  Result Value Ref Range    WBC 13.0 (H) 4.0 -  10.5 K/uL    RBC 4.55 4.22 - 5.81 MIL/uL    Hemoglobin 13.7 13.0 - 17.0 g/dL    HCT 58.8 60.9 - 47.9 %    MCV 90.3 80.0 - 100.0 fL    MCH 30.1 26.0 - 34.0 pg    MCHC 33.3 30.0 - 36.0 g/dL    RDW 87.3 88.4 - 84.4 %  Platelets 340 150 - 400 K/uL    nRBC 0.0 0.0 - 0.2 %      Comment: Performed at [facility], [address], [place], [place] [address]  Magnesium      Status: Abnormal    Collection Time: 06/07/24  5:08 AM  Result Value Ref Range    Magnesium  2.5 (H) 1.7 - 2.4 mg/dL      Comment: Performed at [facility], [address] [place], [place] [address]        Imaging / Studies: Imaging Results (Last 48 hours)  No results found.     Medications / Allergies: per chart   Antibiotics: Anti-infectives (From admission, onward)        Start     Dose/Rate Route Frequency Ordered Stop    06/06/24 0600   cefoTEtan  (CEFOTAN ) 2 g in sodium chloride  0.9 % 100 mL IVPB        2 g 200 mL/hr over 30 Minutes Intravenous On call to O.R. 06/05/24 0810 06/06/24 1334    06/05/24 1400   neomycin  (MYCIFRADIN ) tablet 1,000 mg       Placed in And Linked Group   1,000 mg Oral 3 times per day 06/05/24 0810 06/05/24 2132    06/05/24 1400   metroNIDAZOLE  (FLAGYL ) tablet 1,000 mg  Status:  Discontinued       Placed in And Linked Group   1,000 mg Oral 3 times per day 06/05/24 0810 06/05/24 0855    06/05/24 1000   fluconazole  (DIFLUCAN ) tablet 200 mg        200 mg Oral Weekly 06/02/24 1355      06/02/24 1430   valACYclovir  (VALTREX ) tablet 500 mg        500 mg Oral Daily 06/02/24 1355      06/02/24 1000   piperacillin -tazobactam (ZOSYN ) IVPB 3.375 g        3.375 g 12.5 mL/hr over 240 Minutes Intravenous Every 8 hours 06/02/24 0951      06/01/24 1800   piperacillin -tazobactam (ZOSYN ) IVPB 3.375 g  Status:  Discontinued        3.375 g 12.5 mL/hr over 240 Minutes Intravenous Every 8 hours 06/01/24 1153 06/01/24 1621    06/01/24 1800   cefTRIAXone   (ROCEPHIN ) 2 g in sodium chloride  0.9 % 100 mL IVPB  Status:  Discontinued        2 g 200 mL/hr over 30 Minutes Intravenous Every 24 hours 06/01/24 1621 06/02/24 0944    06/01/24 1800   metroNIDAZOLE  (FLAGYL ) tablet 500 mg  Status:  Discontinued        500 mg Oral Every 12 hours 06/01/24 1621 06/02/24 0944    06/01/24 1200   piperacillin -tazobactam (ZOSYN ) IVPB 3.375 g        3.375 g 100 mL/hr over 30 Minutes Intravenous  Once 06/01/24 1151 06/01/24 1229                 Note: Portions of this report may have been transcribed using voice recognition software. Every effort was made to ensure accuracy; however, inadvertent computerized transcription errors may be present.   Any transcriptional errors that result from this process are unintentional.       Elspeth KYM Schultze, MD, FACS, MASCRS Esophageal, Gastrointestinal & Colorectal Surgery Robotic and Minimally Invasive Surgery   [facility] [address], [address] [place], [place] [address] [phone] Fax [phone] Main   CONTACT INFORMATION:  Weekday (9AM-5PM): Call [facility] main office at [phone] Weeknight (5PM-9AM) or Weekend/Holiday: Check EPIC Web Links tab & use AMION (password  [email or web address]) for General Surgery [facility] coverage   Please, DO NOT use SecureChat  (it is not reliable communication to reach operating surgeons & will lead to a delay in care).   Epic staff messaging available for outpatient concerns needing 1-2 business day response.         06/07/2024  7:43 AM

## 2024-06-09 DIAGNOSIS — K5720 Diverticulitis of large intestine with perforation and abscess without bleeding: Secondary | ICD-10-CM | POA: Diagnosis not present

## 2024-06-09 LAB — BASIC METABOLIC PANEL WITH GFR
Anion gap: 14 (ref 5–15)
BUN: 18 mg/dL (ref 6–20)
CO2: 21 mmol/L — ABNORMAL LOW (ref 22–32)
Calcium: 8.8 mg/dL — ABNORMAL LOW (ref 8.9–10.3)
Chloride: 102 mmol/L (ref 98–111)
Creatinine, Ser: 1.1 mg/dL (ref 0.61–1.24)
GFR, Estimated: >60 mL/min
Glucose, Bld: 86 mg/dL (ref 70–99)
Potassium: 3.8 mmol/L (ref 3.5–5.1)
Sodium: 137 mmol/L (ref 135–145)

## 2024-06-09 LAB — CBC
HCT: 38.4 % — ABNORMAL LOW (ref 39.0–52.0)
Hemoglobin: 12.9 g/dL — ABNORMAL LOW (ref 13.0–17.0)
MCH: 30.8 pg (ref 26.0–34.0)
MCHC: 33.6 g/dL (ref 30.0–36.0)
MCV: 91.6 fL (ref 80.0–100.0)
Platelets: 338 K/uL (ref 150–400)
RBC: 4.19 MIL/uL — ABNORMAL LOW (ref 4.22–5.81)
RDW: 12.8 % (ref 11.5–15.5)
WBC: 10.3 K/uL (ref 4.0–10.5)
nRBC: 0 % (ref 0.0–0.2)

## 2024-06-09 LAB — HEPARIN LEVEL (UNFRACTIONATED): Heparin Unfractionated: 0.47 [IU]/mL (ref 0.30–0.70)

## 2024-06-09 LAB — MAGNESIUM: Magnesium: 2.5 mg/dL — ABNORMAL HIGH (ref 1.7–2.4)

## 2024-06-09 MED ORDER — ENSURE SURGERY PO LIQD: 237.0000 mL | Freq: Two times a day (BID) | ORAL | 0 refills | Status: AC

## 2024-06-09 MED ORDER — OXYBUTYNIN CHLORIDE 5 MG PO TABS: 5.0000 mg | ORAL_TABLET | Freq: Three times a day (TID) | ORAL | 0 refills | Status: AC | PRN

## 2024-06-09 MED ORDER — LOSARTAN POTASSIUM 25 MG PO TABS: 25.0000 mg | ORAL_TABLET | Freq: Every day | ORAL | 0 refills | Status: AC

## 2024-06-09 MED ORDER — HYDROCODONE-ACETAMINOPHEN 5-325 MG PO TABS: 1.0000 | ORAL_TABLET | Freq: Four times a day (QID) | ORAL | 0 refills | Status: AC | PRN

## 2024-06-09 MED ORDER — AMOXICILLIN-POT CLAVULANATE 875-125 MG PO TABS: 1.0000 | ORAL_TABLET | Freq: Two times a day (BID) | ORAL | 0 refills | Status: AC

## 2024-06-09 MED ORDER — CALCIUM POLYCARBOPHIL 625 MG PO TABS: 625.0000 mg | ORAL_TABLET | Freq: Two times a day (BID) | ORAL | 0 refills | Status: AC

## 2024-06-09 NOTE — Progress Notes (Signed)
 Patient discharged home, IV removed, discharge paperwork provided and explained to patient as well as patient's wife, both patient and patient's wife verbalized understanding, primary RN educated patient on taking dose of Xarelto  as soon as he is discharged per pharmacy, patient verbalized understanding.

## 2024-06-09 NOTE — Discharge Summary (Signed)
 Physician Discharge Summary  Patient ID: Benjamin [name] MRN: [id number] DOB/AGE: 1970/12/14 54 y.o.  Admit date: 06/01/2024 Discharge date: 06/09/2024  Admission Diagnoses:  Discharge Diagnoses:  Principal Problem:   Diverticulitis of large intestine with abscess Active Problems:   Essential hypertension   Benign prostatic hyperplasia   History of factor V Leiden mutation   Acute urinary retention   Colonic diverticular abscess   Discharged Condition: {condition:18240}  Hospital Course: ***  Consults: {consultation:18241}  Significant Diagnostic Studies: {diagnostics:18242}  Treatments: {Tx:18249}  Discharge Exam: Blood pressure 116/87, pulse (!) 108, temperature 97.9 F (36.6 C), temperature source Oral, resp. rate 16, height 5' 9 (1.753 m), weight 85.7 kg, SpO2 96%. {physical zkjf:6958869}  Disposition: Discharge disposition: 01-Home or Self Care       Discharge Instructions     Call MD for:   Complete by: As directed    FEVER > 101.5 F  (temperatures < 101.5 F are not significant)   Call MD for:  extreme fatigue   Complete by: As directed    Call MD for:  persistant dizziness or light-headedness   Complete by: As directed    Call MD for:  persistant nausea and vomiting   Complete by: As directed    Call MD for:  redness, tenderness, or signs of infection (pain, swelling, redness, odor or green/yellow discharge around incision site)   Complete by: As directed    Call MD for:  severe uncontrolled pain   Complete by: As directed    Diet - low sodium heart healthy   Complete by: As directed    Start with a bland diet such as soups, liquids, starchy foods, low fat foods, etc. the first few days at home. Gradually advance to a solid, low-fat, high fiber diet by the end of the first week at home.   Add a fiber supplement to your diet (Metamucil, etc) If you feel full, bloated, or constipated, stay on a full liquid or pureed/blenderized diet for a few days  until you feel better and are no longer constipated.   Discharge instructions   Complete by: As directed    See Discharge Instructions If you are not getting better after two weeks or are noticing you are getting worse, contact our office [phone] for further advice.  We may need to adjust your medications, re-evaluate you in the office, send you to the emergency room, or see what other things we can do to help. The clinic staff is available to answer your questions during regular business hours (8:30am-5pm).  Please don't hesitate to call and ask to speak to one of our nurses for clinical concerns.    A surgeon from [facility] is always on call at the hospitals 24 hours/day If you have a medical emergency, go to the nearest emergency room or call 911.   Discharge wound care:   Complete by: As directed    It is good for closed incisions and even open wounds to be washed every day.  Shower every day.  Short baths are fine.  Wash the incisions and wounds clean with soap & water .    You may leave closed incisions open to air if it is dry.   You may cover the incision with clean gauze & replace it after your daily shower for comfort.   Discharge wound care:   Complete by: As directed    Continue current wound care   Driving Restrictions   Complete by: As directed  You may drive when: - you are no longer taking narcotic prescription pain medication - you can comfortably wear a seatbelt - you can safely make sudden turns/stops without pain.   Increase activity slowly   Complete by: As directed    Start light daily activities --- self-care, walking, climbing stairs- beginning the day after surgery.  Gradually increase activities as tolerated.  Control your pain to be active.  Stop when you are tired.  Ideally, walk several times a day, eventually an hour a day.   Most people are back to most day-to-day activities in a few weeks.  It takes 4-6 weeks to get back to unrestricted,  intense activity. If you can walk 30 minutes without difficulty, it is safe to try more intense activity such as jogging, treadmill, bicycling, low-impact aerobics, swimming, etc. Save the most intensive and strenuous activity for last (Usually 4-8 weeks after surgery) such as sit-ups, heavy lifting, contact sports, etc.  Refrain from any intense heavy lifting or straining until you are off narcotics for pain control.  You will have off days, but things should improve week-by-week. DO NOT PUSH THROUGH PAIN.  Let pain be your guide: If it hurts to do something, don't do it.   Increase activity slowly   Complete by: As directed    Lifting restrictions   Complete by: As directed    If you can walk 30 minutes without difficulty, it is safe to try more intense activity such as jogging, treadmill, bicycling, low-impact aerobics, swimming, etc. Save the most intensive and strenuous activity for last (Usually 4-8 weeks after surgery) such as sit-ups, heavy lifting, contact sports, etc.   Refrain from any intense heavy lifting or straining until you are off narcotics for pain control.  You will have off days, but things should improve week-by-week. DO NOT PUSH THROUGH PAIN.  Let pain be your guide: If it hurts to do something, don't do it.  Pain is your body warning you to avoid that activity for another week until the pain goes down.   May shower / Bathe   Complete by: As directed    May walk up steps   Complete by: As directed    Sexual Activity Restrictions   Complete by: As directed    You may have sexual intercourse when it is comfortable. If it hurts to do something, stop.      Allergies as of 06/09/2024       Reactions   Bismuth Subsalicylate Anaphylaxis, Swelling, Other (See Comments)   The throat swells   Compazine  [prochlorperazine  Edisylate] Anxiety, Other (See Comments)   Cervical dystonia and panic attacks, also        Medication List     STOP taking these medications     clindamycin -benzoyl peroxide gel Commonly known as: BENZACLIN   GOODYS BODY PAIN PO   Klayesta  powder Generic drug: nystatin    olmesartan 40 MG tablet Commonly known as: BENICAR   ondansetron  4 MG disintegrating tablet Commonly known as: ZOFRAN -ODT   ZyrTEC -D Allergy & Congestion 5-120 MG tablet Generic drug: cetirizine -pseudoephedrine       TAKE these medications    acetaminophen  500 MG tablet Commonly known as: TYLENOL  Take 500-1,000 mg by mouth every 6 (six) hours as needed for moderate pain (pain score 4-6).   amoxicillin -clavulanate 875-125 MG tablet Commonly known as: AUGMENTIN  Take 1 tablet by mouth 2 (two) times daily for 5 days.   benzonatate  100 MG capsule Commonly known as: TESSALON  Take 100 mg  by mouth 3 (three) times daily as needed for cough.   buPROPion  150 MG 24 hr tablet Commonly known as: WELLBUTRIN  XL Take 450 mg by mouth in the morning.   butalbital -acetaminophen -caffeine  50-325-40 MG tablet Commonly known as: FIORICET  Take 1 tablet by mouth every 4 (four) hours as needed for headache.   diltiazem  240 MG 24 hr capsule Commonly known as: CARDIZEM  CD TAKE 1 CAPSULE BY MOUTH EVERY DAY What changed:  how much to take when to take this   feeding supplement Liqd Take 237 mLs by mouth 2 (two) times daily between meals. Start taking on: June 10, 2024   fluconazole  200 MG tablet Commonly known as: DIFLUCAN  Take 200 mg by mouth every Monday.   HYDROcodone -acetaminophen  5-325 MG tablet Commonly known as: NORCO/VICODIN Take 1-2 tablets by mouth every 6 (six) hours as needed for moderate pain (pain score 4-6) or severe pain (pain score 7-10). What changed:  how much to take when to take this reasons to take this   losartan  25 MG tablet Commonly known as: COZAAR  Take 1 tablet (25 mg total) by mouth daily. Start taking on: June 10, 2024 What changed: how much to take   NexIUM  24HR 20 MG capsule Generic drug: esomeprazole  Take 40  mg by mouth daily before breakfast.   oxybutynin  5 MG tablet Commonly known as: DITROPAN  Take 1 tablet (5 mg total) by mouth every 8 (eight) hours as needed for bladder spasms.   polycarbophil 625 MG tablet Commonly known as: FIBERCON Take 1 tablet (625 mg total) by mouth 2 (two) times daily.   rivaroxaban  20 MG Tabs tablet Commonly known as: XARELTO  Take 1 tablet (20 mg total) by mouth daily with supper. What changed: when to take this   rosuvastatin  40 MG tablet Commonly known as: CRESTOR  Take 1 tablet (40 mg total) by mouth once a week. What changed: additional instructions   tamsulosin  0.4 MG Caps capsule Commonly known as: FLOMAX  Take 1 capsule (0.4 mg total) by mouth daily.   tiZANidine  4 MG tablet Commonly known as: ZANAFLEX  Take 4 mg by mouth every 8 (eight) hours as needed for muscle spasms.   valACYclovir  500 MG tablet Commonly known as: VALTREX  Take 1 tablet (500 mg total) by mouth daily.               Discharge Care Instructions  (From admission, onward)           Start     Ordered   06/09/24 0000  Discharge wound care:       Comments: It is good for closed incisions and even open wounds to be washed every day.  Shower every day.  Short baths are fine.  Wash the incisions and wounds clean with soap & water .    You may leave closed incisions open to air if it is dry.   You may cover the incision with clean gauze & replace it after your daily shower for comfort.   06/09/24 0724   06/09/24 0000  Discharge wound care:       Comments: Continue current wound care   06/09/24 1609            Follow-up Information     Sheldon Standing, MD Follow up on 07/04/2024.   Specialties: General Surgery, Colon and Rectal Surgery Why: 11:00am, Arrive 30 minutes prior to your appointment time, Please bring your insurance card and photo ID Contact information: [address] [place] [address] [phone]  SignedBETHA Leatrice LILLETTE Rosario 06/09/2024, 4:09 PM

## 2024-06-09 NOTE — Progress Notes (Signed)
 Benjamin [name] [id number] 08/31/1970   CARE TEAM: PCP: Gretta Ozell CROME, PA-C   Outpatient Care Team: Patient Care Team: Gretta Ozell CROME [name] as PCP - General (Urgent Care) [name], MD as Consulting Physician (Ophthalmology) Buck Saucer, MD as Consulting Physician (Neurology) Ines Onetha NOVAK, MD as Consulting Physician (Neurology) Timmy Maude SAUNDERS, MD as Consulting Physician (Oncology) Franchot Lauraine HERO, NP as Nurse Practitioner (Hematology and Oncology) Sheldon Standing, MD as Consulting Physician (General Surgery) Shila Gustav GAILS, MD as Consulting Physician (Gastroenterology)   Inpatient Treatment Team: Treatment Team:  [place], Eric J, DO [facility], Md, MD Ebbie Cough, MD Kriste Asberry FORBES, RN Timmy Maude SAUNDERS, MD Sheldon Standing, MD Massie Delaine SAUNDERS, RN Ina Charmaine HERO, RN Lorene Lucita GRADE, [place] Bobbette Levins, MD Haig Damien HERO, RN Shade, Wanda FORBES, RPH Purgason, Vina NOVAK, RRT Estelle Hunter DEL, RN     Problem List:    Principal Problem:   Diverticulitis of large intestine with abscess Active Problems:   Essential hypertension   Benign prostatic hyperplasia   History of factor V Leiden mutation   Acute urinary retention   Colonic diverticular abscess     06/06/2024   POST-OPERATIVE DIAGNOSIS:  SIGMOID DIVERTICULITIS WITH CHRONIC ABSCESS   PROCEDURE:   -ROBOTIC LOW ANTERIOR RECTOSIGMOID RESECTION (LAR) WITH ANASTOMOSIS -INTRAOPERATIVE ASSESSMENT OF TISSUE VASCULAR PERFUSION USING ICG (indocyanine green ) IMMUNOFLUORESCENCE,  -TRANSVERSUS ABDOMINIS PLANE (TAP) BLOCK - BILATERAL -FLEXIBLE SIGMOIDOSCOPY   SURGEON:  Standing [name], MD   OR FINDINGS:   Patient had very inflamed sigmoid colon densely adherent to left lower quadrant pelvis and especially the anterior pelvis/bladder wall.  Contained abscess with some chronic phlegmon noted in the anterior peritoneal reflection overlying the distal sigmoid and proximal rectum.   No obvious  metastatic disease on visceral parietal peritoneum or liver.   It is a 29mm EEA anastomosis ( distal descending colon  connected to proximal rectum.)  It rests 14 cm from the anal verge by flexible sigmoidoscopy     Assessment [facility] Stay = 6 days) 3 Day Post-Op     Recovering relatively well.  No abdominal distention or tenderness. Surgical site is healing well JP drainage Serosanguinous fluid  Pathology reviewed Patient is hypertensive Electrolytes stabilized Plan:   - ERAS protocol  -Discontinue JP drain   - Tolerating normal diet well - continue normal diet.   - Patient is voiding normally; Foley PRN until D/C  Persistent Hypertension is being managed per primary care   - Wean off IV fluids per protocol with backup   - Hemoglobin looks good.  - Anticoagulation - Reinitiate treatment with oral Xarelto  following discharge.   -mobilize as tolerated to help recovery.  Enlist therapies in moderate/high risk patients as appropriate    Updated the patient's status to the patient and spouse  Continue with ambulation and normal diet. Clinic will call to schedule a follow up appointment. Pathology report reviewed and given to patient. They expressed understanding & appreciation.   -Disposition:  Disposition:  The patient is from: Home Anticipate discharge to:  Home Anticipated Date of Discharge is:  January 9,2026   Barriers to discharge:  Pending internal medicine and Consultant clearance & sign off     Patient currently is MEDICALLY STABLE for discharge from the hospital from a surgery standpoint.           I reviewed nursing notes, last 24 h vitals and pain scores, last 48 h intake and output, last 24 h labs and trends, and  last 24 h imaging results.  I have reviewed this patient's available data, including medical history, events of note, test results, etc as part of my evaluation.   A significant portion of that time was spent in counseling. Care during the  described time interval was provided by me.   This care required moderate level of medical decision making.  06/07/2024       Subjective: (Chief complaint)  Patient stated overall feeling better  Pain well controlled with PRN meds  Endorses soreness at incision site and pain following ambulation   Tolerating regular diet well  Denies N/V   Wife at bedside.   Patient is farting and has had several loose bowel movements     Objective:   Vital signs:         Vitals:    06/06/24 2037 06/06/24 2134 06/07/24 0157 06/07/24 0501  BP: (!) 183/104 (!) 145/112 (!) 138/105 (!) 132/96  Pulse: 89 86 96 74  Resp: 18 18 18 18   Temp: (!) 97.5 F (36.4 C) (!) 97.5 F (36.4 C) 97.6 F (36.4 C) 98 F (36.7 C)  TempSrc: Oral Oral Oral Oral  SpO2: 95% 95% 96% 94%  Weight:          Height:              Last BM Date : 06/05/24   Intake/Output    Yesterday:              01/06 0701 - 01/07 0700 In: 7323.3 [P.O.:240; I.V.:2338.5; IV Piggyback:98.1] Out: 2105 [Urine:1850; Drains:180; Blood:75] This shift:              No intake/output data recorded.   Bowel function:              Flatus: Yes              BM:  Yes              Blake pelvic drain: Serosanguinous     Physical Exam:   General: Pt awake/alert in no acute distress.  Sitting up in bed smiling using phone.  Chatty. Not sickly Eyes: PERRL, normal EOM.  Sclera clear.  No icterus Neuro: CN II-XII intact w/o focal sensory/motor deficits. Lymph: No head/neck/groin lymphadenopathy Psych:  No delerium/psychosis/paranoia.  Oriented x  HENT: Normocephalic, Mucus membranes moist.  No thrush Neck: Supple, No tracheal deviation.  No obvious thyromegaly Chest: No pain to chest wall compression.  Good respiratory excursion.  No audible wheezing CV:  Pulses intact.  Regular rhythm.  No major extremity edema MS: Normal AROM mjr joints.  No obvious deformity   Abdomen: Soft.  Nondistended.  Mildly tender at incisions only.  No  evidence of peritonitis.  No incarcerated hernias.   Ext:  No deformity.  No mjr edema.  No cyanosis Skin: No petechiae / purpurea.  No major sores.  Warm and dry       Results:    Cultures:        Recent Results (from the past 720 hours)  MRSA Next Gen by PCR, Nasal     Status: None    Collection Time: 06/05/24 10:12 PM    Specimen: Nasal Mucosa; Nasal Swab  Result Value Ref Range Status    MRSA by PCR Next Gen NOT DETECTED NOT DETECTED Final      Comment: (NOTE) The GeneXpert MRSA Assay (FDA approved for NASAL specimens only), is one component of a comprehensive MRSA colonization surveillance program. It  is not intended to diagnose MRSA infection nor to guide or monitor treatment for MRSA infections. Test performance is not FDA approved in patients less than 16 years old. Performed at [facility], [address], [place], [place] [address]        Labs: Lab Results Last 48 Hours        Results for orders placed or performed during the hospital encounter of 06/01/24 (from the past 48 hours)  MRSA Next Gen by PCR, Nasal     Status: None    Collection Time: 06/05/24 10:12 PM    Specimen: Nasal Mucosa; Nasal Swab  Result Value Ref Range    MRSA by PCR Next Gen NOT DETECTED NOT DETECTED      Comment: (NOTE) The GeneXpert MRSA Assay (FDA approved for NASAL specimens only), is one component of a comprehensive MRSA colonization surveillance program. It is not intended to diagnose MRSA infection nor to guide or monitor treatment for MRSA infections. Test performance is not FDA approved in patients less than 2 years old. Performed at [facility], [address], [place], [place] [address]    CBC     Status: None    Collection Time: 06/06/24  5:22 AM  Result Value Ref Range    WBC 10.0 4.0 - 10.5 K/uL    RBC 4.95 4.22 - 5.81 MIL/uL    Hemoglobin 15.1 13.0 - 17.0 g/dL    HCT 55.6 60.9 - 47.9 %    MCV 89.5 80.0 - 100.0 fL    MCH 30.5  26.0 - 34.0 pg    MCHC 34.1 30.0 - 36.0 g/dL    RDW 87.3 88.4 - 84.4 %    Platelets 320 150 - 400 K/uL    nRBC 0.0 0.0 - 0.2 %      Comment: Performed at [facility], [address] [place], [place] [address]  Heparin  level (unfractionated)     Status: None    Collection Time: 06/06/24  5:22 AM  Result Value Ref Range    Heparin  Unfractionated 0.32 0.30 - 0.70 IU/mL      Comment: (NOTE) The clinical reportable range upper limit is being lowered to >1.10 to align with the FDA approved guidance for the current laboratory assay.   If heparin  results are below expected values, and patient dosage has  been confirmed, suggest follow up testing of antithrombin III  levels. Performed at [facility], [address] [place], [place] [address]    Basic metabolic panel with GFR     Status: Abnormal    Collection Time: 06/06/24  5:22 AM  Result Value Ref Range    Sodium 137 135 - 145 mmol/L    Potassium 3.8 3.5 - 5.1 mmol/L    Chloride 103 98 - 111 mmol/L    CO2 20 (L) 22 - 32 mmol/L    Glucose, Bld 97 70 - 99 mg/dL      Comment: Glucose reference range applies only to samples taken after fasting for at least 8 hours.    BUN 11 6 - 20 mg/dL    Creatinine, Ser 9.03 0.61 - 1.24 mg/dL    Calcium  9.5 8.9 - 10.3 mg/dL    GFR, Estimated >39 >39 mL/min      Comment: (NOTE) Calculated using the CKD-EPI Creatinine Equation (2021)      Anion gap 14 5 - 15      Comment: Performed at [facility], [address] [place], [place] [address]  Magnesium      Status: Abnormal    Collection Time: 06/06/24  5:22 AM  Result Value Ref Range    Magnesium  2.5 (H) 1.7 - 2.4 mg/dL      Comment: Performed at [facility], [address], [place], [place] [address]  CBC     Status: Abnormal    Collection Time: 06/06/24  6:52 PM  Result Value Ref Range    WBC 14.6 (H) 4.0 - 10.5 K/uL    RBC 4.77 4.22 - 5.81 MIL/uL    Hemoglobin  14.8 13.0 - 17.0 g/dL    HCT 57.4 60.9 - 47.9 %    MCV 89.1 80.0 - 100.0 fL    MCH 31.0 26.0 - 34.0 pg    MCHC 34.8 30.0 - 36.0 g/dL    RDW 87.5 88.4 - 84.4 %    Platelets 327 150 - 400 K/uL    nRBC 0.0 0.0 - 0.2 %      Comment: Performed at [facility], [address], [place], [place] [address]  Heparin  level (unfractionated)     Status: Abnormal    Collection Time: 06/07/24  5:08 AM  Result Value Ref Range    Heparin  Unfractionated <0.10 (L) 0.30 - 0.70 IU/mL      Comment: (NOTE) The clinical reportable range upper limit is being lowered to >1.10 to align with the FDA approved guidance for the current laboratory assay.   If heparin  results are below expected values, and patient dosage has  been confirmed, suggest follow up testing of antithrombin III  levels. Performed at [facility], [address], [place], [place] [address]    Basic metabolic panel with GFR     Status: Abnormal    Collection Time: 06/07/24  5:08 AM  Result Value Ref Range    Sodium 135 135 - 145 mmol/L    Potassium 4.5 3.5 - 5.1 mmol/L    Chloride 101 98 - 111 mmol/L    CO2 19 (L) 22 - 32 mmol/L    Glucose, Bld 117 (H) 70 - 99 mg/dL      Comment: Glucose reference range applies only to samples taken after fasting for at least 8 hours.    BUN 14 6 - 20 mg/dL    Creatinine, Ser 8.85 0.61 - 1.24 mg/dL    Calcium  9.2 8.9 - 10.3 mg/dL    GFR, Estimated >39 >39 mL/min      Comment: (NOTE) Calculated using the CKD-EPI Creatinine Equation (2021)      Anion gap 15 5 - 15      Comment: Performed at [facility], [address], [place], [place] [address]  CBC     Status: Abnormal    Collection Time: 06/07/24  5:08 AM  Result Value Ref Range    WBC 13.0 (H) 4.0 - 10.5 K/uL    RBC 4.55 4.22 - 5.81 MIL/uL    Hemoglobin 13.7 13.0 - 17.0 g/dL    HCT 58.8 60.9 - 47.9 %    MCV 90.3 80.0 - 100.0 fL    MCH 30.1 26.0 - 34.0 pg    MCHC 33.3 30.0 - 36.0 g/dL     RDW 87.3 88.4 - 84.4 %    Platelets 340 150 - 400 K/uL    nRBC 0.0 0.0 - 0.2 %      Comment: Performed at [facility], [address] [place],  [place] [address]  Magnesium      Status: Abnormal    Collection Time: 06/07/24  5:08 AM  Result Value Ref Range    Magnesium  2.5 (H) 1.7 - 2.4 mg/dL      Comment: Performed at [facility], [address], [place], [place] [address]        Imaging / Studies: Imaging Results (Last 48 hours)  No results found.     Medications / Allergies: per chart   Antibiotics: Anti-infectives (From admission, onward)        Start     Dose/Rate Route Frequency Ordered Stop    06/06/24 0600   cefoTEtan  (CEFOTAN ) 2 g in sodium chloride  0.9 % 100 mL IVPB        2 g 200 mL/hr over 30 Minutes Intravenous On call to O.R. 06/05/24 0810 06/06/24 1334    06/05/24 1400   neomycin  (MYCIFRADIN ) tablet 1,000 mg       Placed in And Linked Group   1,000 mg Oral 3 times per day 06/05/24 0810 06/05/24 2132    06/05/24 1400   metroNIDAZOLE  (FLAGYL ) tablet 1,000 mg  Status:  Discontinued       Placed in And Linked Group   1,000 mg Oral 3 times per day 06/05/24 0810 06/05/24 0855    06/05/24 1000   fluconazole  (DIFLUCAN ) tablet 200 mg        200 mg Oral Weekly 06/02/24 1355      06/02/24 1430   valACYclovir  (VALTREX ) tablet 500 mg        500 mg Oral Daily 06/02/24 1355      06/02/24 1000   piperacillin -tazobactam (ZOSYN ) IVPB 3.375 g        3.375 g 12.5 mL/hr over 240 Minutes Intravenous Every 8 hours 06/02/24 0951      06/01/24 1800   piperacillin -tazobactam (ZOSYN ) IVPB 3.375 g  Status:  Discontinued        3.375 g 12.5 mL/hr over 240 Minutes Intravenous Every 8 hours 06/01/24 1153 06/01/24 1621    06/01/24 1800   cefTRIAXone  (ROCEPHIN ) 2 g in sodium chloride  0.9 % 100 mL IVPB  Status:  Discontinued        2 g 200 mL/hr over 30 Minutes Intravenous Every 24 hours 06/01/24 1621 06/02/24 0944    06/01/24 1800    metroNIDAZOLE  (FLAGYL ) tablet 500 mg  Status:  Discontinued        500 mg Oral Every 12 hours 06/01/24 1621 06/02/24 0944    06/01/24 1200   piperacillin -tazobactam (ZOSYN ) IVPB 3.375 g        3.375 g 100 mL/hr over 30 Minutes Intravenous  Once 06/01/24 1151 06/01/24 1229                 Note: Portions of this report may have been transcribed using voice recognition software. Every effort was made to ensure accuracy; however, inadvertent computerized transcription errors may be present.   Any transcriptional errors that result from this process are unintentional.       Elspeth KYM Schultze, MD, FACS, MASCRS Esophageal, Gastrointestinal & Colorectal Surgery Robotic and Minimally Invasive Surgery   [facility] [address] [place], [place] [address] [phone] Fax [phone] Main   CONTACT INFORMATION: Weekday (9AM-5PM): Call [facility] main office at [phone] Weeknight (5PM-9AM) or Weekend/Holiday: Check EPIC Web Links tab & use AMION (password  [facility]) for General Surgery [facility] coverage   Please, DO  NOT use SecureChat  (it is not reliable communication to reach operating surgeons & will lead to a delay in care).   Epic staff messaging available for outpatient concerns needing 1-2 business day response.         06/07/2024  7:43 AM

## 2024-06-09 NOTE — Progress Notes (Signed)
 PHARMACY - ANTICOAGULATION CONSULT NOTE  Pharmacy Consult for heparin  Indication: factor V leiden / DVT (Xarelto  on hold)  Allergies[1]  Patient Measurements: Height: 5' 9 (175.3 cm) Weight: 85.7 kg (188 lb 13.9 oz) IBW/kg (Calculated) : 70.7 HEPARIN  DW (KG): 87.5  Vital Signs: Temp: 97.9 F (36.6 C) (01/09 0608) Temp Source: Oral (01/09 0608) BP: 153/103 (01/09 0608) Pulse Rate: 73 (01/09 0608)  Labs: Recent Labs    06/07/24 0508 06/07/24 1823 06/08/24 0049 06/08/24 1355 06/08/24 1846 06/08/24 2239 06/09/24 0520  HGB 13.7  --  13.5   < > 13.6 12.8* 12.9*  HCT 41.1  --  40.7   < > 40.1 38.5* 38.4*  PLT 340  --  310  --   --   --  338  HEPARINUNFRC <0.10* 0.37 0.46  --   --   --  0.47  CREATININE 1.14  --  1.30*  --   --   --  1.10   < > = values in this interval not displayed.    Estimated Creatinine Clearance: 83.3 mL/min (by C-G formula based on SCr of 1.1 mg/dL).   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 Pt admitted with diverticulitis with abscess. Xarelto  on hold pending potential surgical intervention. Pharmacy consulted to dose heparin .  Significant events: 1/6 Heparin  held at 6:15 am prior to surgery at 12:20.  Postop, no heparin  plans specified.  On call surgeon Dr. Signe requests follow up next day. 1/7 Resume heparin  no bolus per Dr Sheldon   Today, 06/09/2024, PM: Heparin  level 0.47, therapeutic on heparin  1600 units/hr CBC:  Hgb 12.9, Plt WNL No bleeding or complications reported.   Goal of Therapy: Heparin  level 0.3-0.7 units/ml Monitor platelets by anticoagulation protocol: Yes  Plan: Continue IV heparin  at current rate of 1600 units/hr CBC, heparin  level daily Monitor for signs of bleeding Follow up when safe to resume oral anticoagulation   Aleysia Oltmann PharmD, BCPS [facility] main pharmacy [phone] 06/09/2024 7:19  AM             [1]  Allergies Allergen Reactions   Bismuth Subsalicylate Anaphylaxis, Swelling and Other (See Comments)    The throat swells   Compazine  [Prochlorperazine  Edisylate] Anxiety and Other (See Comments)    Cervical dystonia and panic attacks, also

## 2024-06-14 ENCOUNTER — Encounter (HOSPITAL_BASED_OUTPATIENT_CLINIC_OR_DEPARTMENT_OTHER): Payer: Self-pay | Admitting: *Deleted

## 2024-06-14 ENCOUNTER — Other Ambulatory Visit: Payer: Self-pay

## 2024-06-14 ENCOUNTER — Emergency Department (HOSPITAL_BASED_OUTPATIENT_CLINIC_OR_DEPARTMENT_OTHER)
Admission: EM | Admit: 2024-06-14 | Discharge: 2024-06-14 | Discharge status: Against Medical Advice | Attending: Emergency Medicine | Admitting: Emergency Medicine

## 2024-06-14 DIAGNOSIS — I959 Hypotension, unspecified: Secondary | ICD-10-CM | POA: Insufficient documentation

## 2024-06-14 DIAGNOSIS — Z5321 Procedure and treatment not carried out due to patient leaving prior to being seen by health care provider: Secondary | ICD-10-CM | POA: Insufficient documentation

## 2024-06-14 NOTE — ED Triage Notes (Addendum)
 Pt states that he was seen by his PCP and his BP 84/63 and he was told to come to the ED>  Pt states that he recently had a colon resection due to diverticulitis.  Pt has been feeling well but thinks he has not been hydrating enough.  Pt drank about 60 oz since his low BP with PCP.  No distress in triage. Pt states that his BP med was doubled (began taking double dose Monday)  and his PCP told him to hold it until he sees PCP again next week.  Hypotension resolved on arrival to ED

## 2024-07-02 ENCOUNTER — Other Ambulatory Visit: Payer: Self-pay | Admitting: Internal Medicine
